# Patient Record
Sex: Female | Born: 1935
Health system: Southern US, Community
[De-identification: ages and names within clinical notes are randomized; demographics above are authoritative.]

## PROBLEM LIST (undated history)

## (undated) DIAGNOSIS — IMO0002 Reserved for concepts with insufficient information to code with codable children: Secondary | ICD-10-CM

## (undated) DIAGNOSIS — A419 Sepsis, unspecified organism: Secondary | ICD-10-CM

## (undated) DIAGNOSIS — K589 Irritable bowel syndrome without diarrhea: Secondary | ICD-10-CM

## (undated) DIAGNOSIS — M1990 Unspecified osteoarthritis, unspecified site: Secondary | ICD-10-CM

## (undated) DIAGNOSIS — K219 Gastro-esophageal reflux disease without esophagitis: Secondary | ICD-10-CM

## (undated) DIAGNOSIS — N189 Chronic kidney disease, unspecified: Secondary | ICD-10-CM

## (undated) DIAGNOSIS — J45909 Unspecified asthma, uncomplicated: Secondary | ICD-10-CM

## (undated) DIAGNOSIS — M4800 Spinal stenosis, site unspecified: Secondary | ICD-10-CM

## (undated) DIAGNOSIS — F419 Anxiety disorder, unspecified: Secondary | ICD-10-CM

## (undated) DIAGNOSIS — D869 Sarcoidosis, unspecified: Secondary | ICD-10-CM

## (undated) DIAGNOSIS — E7800 Pure hypercholesterolemia, unspecified: Secondary | ICD-10-CM

## (undated) DIAGNOSIS — I10 Essential (primary) hypertension: Secondary | ICD-10-CM

## (undated) HISTORY — DX: Unspecified asthma, uncomplicated: J45.909

## (undated) HISTORY — DX: Chronic kidney disease, unspecified: N18.9

## (undated) HISTORY — DX: Sepsis, unspecified organism: A41.9

## (undated) HISTORY — DX: Anxiety disorder, unspecified: F41.9

## (undated) HISTORY — DX: Unspecified osteoarthritis, unspecified site: M19.90

---

## 2006-12-19 ENCOUNTER — Emergency Department (HOSPITAL_COMMUNITY): Admission: EM | Admit: 2006-12-19 | Discharge: 2006-12-19 | Payer: Self-pay | Admitting: Emergency Medicine

## 2010-03-30 ENCOUNTER — Emergency Department (HOSPITAL_BASED_OUTPATIENT_CLINIC_OR_DEPARTMENT_OTHER)
Admission: EM | Admit: 2010-03-30 | Discharge: 2010-03-30 | Disposition: A | Discharge status: Home / Self Care | Payer: Medicare Other | Attending: Emergency Medicine | Admitting: Emergency Medicine

## 2010-03-30 DIAGNOSIS — E119 Type 2 diabetes mellitus without complications: Secondary | ICD-10-CM | POA: Insufficient documentation

## 2010-03-30 DIAGNOSIS — R3989 Other symptoms and signs involving the genitourinary system: Secondary | ICD-10-CM | POA: Insufficient documentation

## 2010-03-30 DIAGNOSIS — R5381 Other malaise: Secondary | ICD-10-CM | POA: Insufficient documentation

## 2010-03-30 DIAGNOSIS — E7800 Pure hypercholesterolemia, unspecified: Secondary | ICD-10-CM | POA: Insufficient documentation

## 2010-03-30 DIAGNOSIS — I10 Essential (primary) hypertension: Secondary | ICD-10-CM | POA: Insufficient documentation

## 2010-03-30 DIAGNOSIS — Z79899 Other long term (current) drug therapy: Secondary | ICD-10-CM | POA: Insufficient documentation

## 2010-03-30 LAB — COMPREHENSIVE METABOLIC PANEL
ALT: 16 U/L (ref 0–35)
Albumin: 4.1 g/dL (ref 3.5–5.2)
Calcium: 9.6 mg/dL (ref 8.4–10.5)
Creatinine, Ser: 1 mg/dL (ref 0.4–1.2)
Glucose, Bld: 270 mg/dL — ABNORMAL HIGH (ref 70–99)
Sodium: 143 mEq/L (ref 135–145)
Total Bilirubin: 0.5 mg/dL (ref 0.3–1.2)
Total Protein: 7 g/dL (ref 6.0–8.3)

## 2010-03-30 LAB — POCT CARDIAC MARKERS
Myoglobin, poc: 95.8 ng/mL (ref 12–200)
Troponin i, poc: <0.05 ng/mL (ref 0.00–0.09)

## 2010-03-30 LAB — DIFFERENTIAL
Lymphs Abs: 1 10*3/uL (ref 0.7–4.0)
Monocytes Absolute: 0.4 10*3/uL (ref 0.1–1.0)
Monocytes Relative: 8 % (ref 3–12)

## 2010-03-30 LAB — CBC
HCT: 35.7 % — ABNORMAL LOW (ref 36.0–46.0)
Hemoglobin: 11.9 g/dL — ABNORMAL LOW (ref 12.0–15.0)
MCHC: 33.3 g/dL (ref 30.0–36.0)
MCV: 91.5 fL (ref 78.0–100.0)
RBC: 3.9 MIL/uL (ref 3.87–5.11)

## 2010-03-30 LAB — URINALYSIS, ROUTINE W REFLEX MICROSCOPIC
Bilirubin Urine: NEGATIVE
Hgb urine dipstick: NEGATIVE
Ketones, ur: NEGATIVE mg/dL
Specific Gravity, Urine: 1.013 (ref 1.005–1.030)
Urine Glucose, Fasting: 250 mg/dL — AB

## 2010-04-21 ENCOUNTER — Emergency Department (HOSPITAL_BASED_OUTPATIENT_CLINIC_OR_DEPARTMENT_OTHER)
Admission: EM | Admit: 2010-04-21 | Discharge: 2010-04-21 | Disposition: A | Discharge status: Nursing Home (Includes ICF) | Payer: Medicare Other | Attending: Emergency Medicine | Admitting: Emergency Medicine

## 2010-04-21 ENCOUNTER — Emergency Department (INDEPENDENT_AMBULATORY_CARE_PROVIDER_SITE_OTHER): Payer: Medicare Other

## 2010-04-21 DIAGNOSIS — I10 Essential (primary) hypertension: Secondary | ICD-10-CM | POA: Insufficient documentation

## 2010-04-21 DIAGNOSIS — R509 Fever, unspecified: Secondary | ICD-10-CM

## 2010-04-21 DIAGNOSIS — J189 Pneumonia, unspecified organism: Secondary | ICD-10-CM | POA: Insufficient documentation

## 2010-04-21 DIAGNOSIS — E7800 Pure hypercholesterolemia, unspecified: Secondary | ICD-10-CM | POA: Insufficient documentation

## 2010-04-21 DIAGNOSIS — E119 Type 2 diabetes mellitus without complications: Secondary | ICD-10-CM | POA: Insufficient documentation

## 2010-04-21 DIAGNOSIS — R0609 Other forms of dyspnea: Secondary | ICD-10-CM | POA: Insufficient documentation

## 2010-04-21 DIAGNOSIS — Z79899 Other long term (current) drug therapy: Secondary | ICD-10-CM | POA: Insufficient documentation

## 2010-04-21 DIAGNOSIS — R0989 Other specified symptoms and signs involving the circulatory and respiratory systems: Secondary | ICD-10-CM | POA: Insufficient documentation

## 2010-04-21 LAB — URINALYSIS, ROUTINE W REFLEX MICROSCOPIC
Hgb urine dipstick: NEGATIVE
Ketones, ur: 15 mg/dL — AB
pH: 6.5 (ref 5.0–8.0)

## 2010-04-21 LAB — CBC
Hemoglobin: 12.3 g/dL (ref 12.0–15.0)
MCHC: 34.4 g/dL (ref 30.0–36.0)
RDW: 13.2 % (ref 11.5–15.5)
WBC: 12.6 10*3/uL — ABNORMAL HIGH (ref 4.0–10.5)

## 2010-04-21 LAB — DIFFERENTIAL
Basophils Relative: 0 % (ref 0–1)
Eosinophils Absolute: 0 10*3/uL (ref 0.0–0.7)
Eosinophils Relative: 0 % (ref 0–5)
Lymphocytes Relative: 5 % — ABNORMAL LOW (ref 12–46)
Monocytes Absolute: 1.4 10*3/uL — ABNORMAL HIGH (ref 0.1–1.0)
Neutrophils Relative %: 83 % — ABNORMAL HIGH (ref 43–77)

## 2010-04-21 LAB — BASIC METABOLIC PANEL
BUN: 16 mg/dL (ref 6–23)
Calcium: 9.4 mg/dL (ref 8.4–10.5)
Chloride: 100 mEq/L (ref 96–112)
Creatinine, Ser: 0.9 mg/dL (ref 0.4–1.2)
GFR calc Af Amer: >60 mL/min (ref >60)
GFR calc non Af Amer: 60 mL/min — ABNORMAL LOW (ref >60)
Glucose, Bld: 201 mg/dL — ABNORMAL HIGH (ref 70–99)
Potassium: 3.9 mEq/L (ref 3.5–5.1)

## 2010-11-18 LAB — COMPREHENSIVE METABOLIC PANEL
Alkaline Phosphatase: 77
BUN: 11
CO2: 26
Calcium: 9.1
Chloride: 102
Creatinine, Ser: 0.89
GFR calc Af Amer: >60
Glucose, Bld: 168 — ABNORMAL HIGH
Potassium: 3.6
Total Bilirubin: 0.8
Total Protein: 6.9

## 2010-11-18 LAB — CBC
Hemoglobin: 12.2
MCHC: 33.3
Platelets: 207

## 2010-11-18 LAB — URINALYSIS, ROUTINE W REFLEX MICROSCOPIC
Hgb urine dipstick: NEGATIVE
Ketones, ur: 15 — AB
Specific Gravity, Urine: 1.017
pH: 6

## 2010-11-18 LAB — DIFFERENTIAL
Basophils Absolute: 0
Basophils Relative: 0
Neutro Abs: 6.4
Neutrophils Relative %: 81 — ABNORMAL HIGH

## 2010-11-18 LAB — I-STAT 8, (EC8 V) (CONVERTED LAB)
Acid-Base Excess: 2
BUN: 12
Bicarbonate: 27.2 — ABNORMAL HIGH
Chloride: 104
Glucose, Bld: 175 — ABNORMAL HIGH
Hemoglobin: 13.9
Potassium: 3.6
Sodium: 138
pCO2, Ven: 44.8 — ABNORMAL LOW

## 2010-11-18 LAB — URINE CULTURE: Colony Count: 50000

## 2010-11-18 LAB — URINE MICROSCOPIC-ADD ON

## 2010-11-18 LAB — OCCULT BLOOD X 1 CARD TO LAB, STOOL: Fecal Occult Bld: NEGATIVE

## 2010-11-18 LAB — POCT I-STAT CREATININE: Operator id: 257131

## 2011-03-28 ENCOUNTER — Encounter (HOSPITAL_BASED_OUTPATIENT_CLINIC_OR_DEPARTMENT_OTHER): Payer: Self-pay | Admitting: *Deleted

## 2011-03-28 ENCOUNTER — Emergency Department (HOSPITAL_BASED_OUTPATIENT_CLINIC_OR_DEPARTMENT_OTHER)
Admission: EM | Admit: 2011-03-28 | Discharge: 2011-03-28 | Disposition: A | Discharge status: Home / Self Care | Payer: Medicare Other | Attending: Emergency Medicine | Admitting: Emergency Medicine

## 2011-03-28 DIAGNOSIS — E7800 Pure hypercholesterolemia, unspecified: Secondary | ICD-10-CM | POA: Insufficient documentation

## 2011-03-28 DIAGNOSIS — K5641 Fecal impaction: Secondary | ICD-10-CM

## 2011-03-28 DIAGNOSIS — I10 Essential (primary) hypertension: Secondary | ICD-10-CM | POA: Insufficient documentation

## 2011-03-28 DIAGNOSIS — K5900 Constipation, unspecified: Secondary | ICD-10-CM | POA: Insufficient documentation

## 2011-03-28 DIAGNOSIS — E119 Type 2 diabetes mellitus without complications: Secondary | ICD-10-CM | POA: Insufficient documentation

## 2011-03-28 HISTORY — DX: Essential (primary) hypertension: I10

## 2011-03-28 HISTORY — DX: Sarcoidosis, unspecified: D86.9

## 2011-03-28 HISTORY — DX: Pure hypercholesterolemia, unspecified: E78.00

## 2011-03-28 MED ORDER — FLEET ENEMA 7-19 GM/118ML RE ENEM
2.0000 | ENEMA | Freq: Once | RECTAL | Status: AC
  Administered 2011-03-28: 2 via RECTAL

## 2011-03-28 MED ORDER — FLEET ENEMA 7-19 GM/118ML RE ENEM
ENEMA | RECTAL | Status: AC
  Administered 2011-03-28: 2 via RECTAL
  Filled 2011-03-28: qty 2

## 2011-03-28 NOTE — ED Notes (Signed)
Pt has had dark tarry BM x1 on bedpan.  Pt was cleaned and changed with help of Lauren, EMT.

## 2011-03-28 NOTE — ED Notes (Signed)
Pt states her last BM was 2 days ago and she feels the need to go now, but cannot. Took part of an enema at 6p, but no results.

## 2011-03-28 NOTE — ED Notes (Signed)
Pt is on bed pan, karen, pa disimpacted pt digitally after administering two enemas.

## 2011-03-28 NOTE — Discharge Instructions (Signed)

## 2011-03-28 NOTE — ED Provider Notes (Signed)
History     CSN: [id number]  Arrival date & time 03/28/11  2005   First MD Initiated Contact with Patient 03/28/11 2222      Chief Complaint  Patient presents with  . Constipation    (Consider location/radiation/quality/duration/timing/severity/associated sxs/prior treatment) Patient is a 78 y.o. female presenting with constipation. The history is provided by the patient.  Constipation  The current episode started 2 days ago. The problem has been gradually worsening. The pain is moderate. The stool is described as hard. Prior successful therapies include fiber, laxatives and enemas. There was no prior unsuccessful therapy. She has been eating and drinking normally. There were no sick contacts.  Pt complains of constipation.  Pt took MOM at home.  Pt tried to give herself an enema but could not.  Past Medical History  Diagnosis Date  . Diabetes mellitus   . Hypertension   . Sarcoidosis   . Hypercholesteremia     History reviewed. No pertinent past surgical history.  History reviewed. No pertinent family history.  History  Substance Use Topics  . Smoking status: Former Games developer  . Smokeless tobacco: Not on file  . Alcohol Use: No    OB History    Grav Para Term Preterm Abortions TAB SAB Ect Mult Living                  Review of Systems  Gastrointestinal: Positive for constipation.  All other systems reviewed and are negative.    Allergies  Review of patient's allergies indicates no known allergies.  Home Medications   Current Outpatient Rx  Name Route Sig Dispense Refill  . BISACODYL 10 MG/30ML RE ENEM Rectal Place 10 mg rectally once.    . CLONAZEPAM 0.5 MG PO TABS Oral Take 0.5 mg by mouth 2 (two) times daily as needed. For anxiety    . GLIPIZIDE 10 MG PO TABS Oral Take 10 mg by mouth 2 (two) times daily before a meal.    . METFORMIN HCL 500 MG PO TABS Oral Take 500 mg by mouth 2 (two) times daily with a meal.    . NIFEDIPINE ER 30 MG PO TB24 Oral Take 30  mg by mouth daily.    Marland Kitchen OMEPRAZOLE 20 MG PO CPDR Oral Take 20 mg by mouth daily.    Marland Kitchen SIMVASTATIN 40 MG PO TABS Oral Take 40 mg by mouth every evening.      BP 153/69  Pulse 83  Temp(Src) 98.2 F (36.8 C) (Oral)  Resp 15  Ht 5\' 5"  (1.651 m)  Wt 134 lb (60.782 kg)  BMI 22.30 kg/m2  SpO2 96%  Physical Exam  Vitals reviewed. Constitutional: She appears well-developed.  HENT:  Head: Normocephalic.  Eyes: Conjunctivae are normal. Pupils are equal, round, and reactive to light.  Neck: Normal range of motion.  Cardiovascular: Normal rate and normal heart sounds.   Abdominal: Soft. Bowel sounds are normal.  Genitourinary:       Rectal exam,  Impacted,  Rectum dilated with stool protruding  Musculoskeletal: Normal range of motion.  Neurological: She is alert.  Skin: Skin is warm.  Psychiatric: She has a normal mood and affect.    ED Course  Procedures (including critical care time)  Labs Reviewed - No data to display No results found.   No diagnosis found.    MDM    I removed distal stool.  I tried to break up impaction and had pt sit on toliet,  Pt passed very small amount.  Pt given enema by me,  Enema loosened enough that I could remove distal stool impaction.  Pt given 2nd enema after impaction and pt was able to have large bm.   Pt reports she feels much better.       Langston Masker, [place] 03/28/11 2341

## 2011-03-29 NOTE — ED Provider Notes (Signed)
Medical screening examination/treatment/procedure(s) were conducted as a shared visit with non-physician practitioner(s) and myself.  I personally evaluated the patient during the encounter Patient complains of constipation feels much improved after treatment in the emergency department with manual disimpaction and enema. Patient alert not acutely ill appearing.  Doug Sou, MD 03/29/11 [phone]

## 2011-10-04 ENCOUNTER — Encounter (HOSPITAL_BASED_OUTPATIENT_CLINIC_OR_DEPARTMENT_OTHER): Payer: Self-pay | Admitting: *Deleted

## 2011-10-04 ENCOUNTER — Emergency Department (HOSPITAL_BASED_OUTPATIENT_CLINIC_OR_DEPARTMENT_OTHER)
Admission: EM | Admit: 2011-10-04 | Discharge: 2011-10-04 | Discharge status: Against Medical Advice | Payer: Medicare Other | Attending: Emergency Medicine | Admitting: Emergency Medicine

## 2011-10-04 DIAGNOSIS — K5900 Constipation, unspecified: Secondary | ICD-10-CM | POA: Insufficient documentation

## 2011-10-04 NOTE — ED Notes (Signed)
Ptstates she has not had a BM since Tues. Tried suppositories and enemas without success. C/O abd and rectal discomfort.

## 2012-07-14 ENCOUNTER — Encounter (HOSPITAL_BASED_OUTPATIENT_CLINIC_OR_DEPARTMENT_OTHER): Payer: Self-pay | Admitting: *Deleted

## 2012-07-14 ENCOUNTER — Emergency Department (HOSPITAL_BASED_OUTPATIENT_CLINIC_OR_DEPARTMENT_OTHER)
Admission: EM | Admit: 2012-07-14 | Discharge: 2012-07-14 | Disposition: A | Discharge status: Home / Self Care | Payer: Medicare Other | Attending: Emergency Medicine | Admitting: Emergency Medicine

## 2012-07-14 DIAGNOSIS — Z79899 Other long term (current) drug therapy: Secondary | ICD-10-CM | POA: Insufficient documentation

## 2012-07-14 DIAGNOSIS — E119 Type 2 diabetes mellitus without complications: Secondary | ICD-10-CM | POA: Insufficient documentation

## 2012-07-14 DIAGNOSIS — Z8619 Personal history of other infectious and parasitic diseases: Secondary | ICD-10-CM | POA: Insufficient documentation

## 2012-07-14 DIAGNOSIS — K5641 Fecal impaction: Secondary | ICD-10-CM

## 2012-07-14 DIAGNOSIS — I10 Essential (primary) hypertension: Secondary | ICD-10-CM | POA: Insufficient documentation

## 2012-07-14 DIAGNOSIS — E7800 Pure hypercholesterolemia, unspecified: Secondary | ICD-10-CM | POA: Insufficient documentation

## 2012-07-14 DIAGNOSIS — Z87891 Personal history of nicotine dependence: Secondary | ICD-10-CM | POA: Insufficient documentation

## 2012-07-14 NOTE — ED Provider Notes (Signed)
History     CSN: [id number]  Arrival date & time 07/14/12  1102   First MD Initiated Contact with Patient 07/14/12 1139      Chief Complaint  Patient presents with  . Abdominal Pain  . Constipation    (Consider location/radiation/quality/duration/timing/severity/associated sxs/prior treatment) HPI Complains of unable to have bowel movement for the past 2 days. Feels a fullness in her rectum. She denies abdominal pain denies anorexia denies fever denies other complaint. She treated herself with a laxative yesterday with no relief. Nothing makes symptoms better or worse she's had similar episodes in the past. She feels constipated. Past Medical History  Diagnosis Date  . Diabetes mellitus   . Hypertension   . Sarcoidosis   . Hypercholesteremia     History reviewed. No pertinent past surgical history.  No family history on file.  History  Substance Use Topics  . Smoking status: Former Games developer  . Smokeless tobacco: Not on file  . Alcohol Use: No    OB History   Grav Para Term Preterm Abortions TAB SAB Ect Mult Living                  Review of Systems  Constitutional: Negative.   HENT: Negative.   Respiratory: Negative.   Cardiovascular: Negative.   Gastrointestinal: Positive for constipation.  Musculoskeletal: Negative.   Skin: Negative.   Allergic/Immunologic: Positive for immunocompromised state.       Diabetic  Neurological: Negative.   Psychiatric/Behavioral: Negative.   All other systems reviewed and are negative.    Allergies  Review of patient's allergies indicates no known allergies.  Home Medications   Current Outpatient Rx  Name  Route  Sig  Dispense  Refill  . TRAMADOL HCL PO   Oral   Take by mouth.         . bisacodyl (FLEET) 10 MG/30ML ENEM   Rectal   Place 10 mg rectally once.         . clonazePAM (KLONOPIN) 0.5 MG tablet   Oral   Take 0.5 mg by mouth 2 (two) times daily as needed. For anxiety         . diclofenac sodium  (VOLTAREN) 1 % GEL   Topical   Apply 2 g topically as needed.         Marland Kitchen glipiZIDE (GLUCOTROL) 10 MG tablet   Oral   Take 10 mg by mouth 2 (two) times daily before a meal.         . metFORMIN (GLUCOPHAGE) 500 MG tablet   Oral   Take 500 mg by mouth 2 (two) times daily with a meal.         . NIFEdipine (PROCARDIA-XL/ADALAT CC) 30 MG 24 hr tablet   Oral   Take 30 mg by mouth daily.         Marland Kitchen omeprazole (PRILOSEC) 20 MG capsule   Oral   Take 20 mg by mouth daily.         . simvastatin (ZOCOR) 40 MG tablet   Oral   Take 40 mg by mouth every evening.           BP 105/56  Pulse 104  Temp(Src) 99.4 F (37.4 C) (Oral)  Resp 18  SpO2 97%  Physical Exam  Nursing note and vitals reviewed. Constitutional: She appears well-developed and well-nourished.  HENT:  Head: Normocephalic and atraumatic.  Eyes: Conjunctivae are normal. Pupils are equal, round, and reactive to light.  Neck: Neck supple. No  tracheal deviation present. No thyromegaly present.  Cardiovascular: Normal rate and regular rhythm.   No murmur heard. Pulmonary/Chest: Effort normal and breath sounds normal.  Abdominal: Soft. Bowel sounds are normal. She exhibits no distension. There is no tenderness.  Genitourinary:  Copious hard brown stool in the rectum, nontender no external lesion.  Musculoskeletal: Normal range of motion. She exhibits no edema and no tenderness.  Neurological: She is alert. Coordination normal.  Skin: Skin is warm and dry. No rash noted.  Psychiatric: She has a normal mood and affect.    ED Course  Procedures (including critical care time)  Labs Reviewed - No data to display No results found.   No diagnosis found.  Patient was digitally disimpacted by me Patient had bowel movement after digital disimpaction. At 12:30 PM she is asymptomatic. MDM  Discharge instructions. I stressed patient that she needs to stable hydrated. Fruits and vegetables. Diagnosis fecal  impaction        Doug Sou, MD 07/14/12 1232

## 2012-07-14 NOTE — ED Notes (Signed)
Abdominal pain. States she has been constipated x 2 days. She took a laxative yesterday.

## 2013-02-22 ENCOUNTER — Emergency Department (HOSPITAL_BASED_OUTPATIENT_CLINIC_OR_DEPARTMENT_OTHER): Payer: Medicare Other

## 2013-02-22 ENCOUNTER — Encounter (HOSPITAL_BASED_OUTPATIENT_CLINIC_OR_DEPARTMENT_OTHER): Payer: Self-pay | Admitting: Emergency Medicine

## 2013-02-22 ENCOUNTER — Emergency Department (HOSPITAL_BASED_OUTPATIENT_CLINIC_OR_DEPARTMENT_OTHER)
Admission: EM | Admit: 2013-02-22 | Discharge: 2013-02-22 | Disposition: A | Discharge status: Home / Self Care | Payer: Medicare Other | Attending: Emergency Medicine | Admitting: Emergency Medicine

## 2013-02-22 DIAGNOSIS — M549 Dorsalgia, unspecified: Secondary | ICD-10-CM

## 2013-02-22 DIAGNOSIS — Z79899 Other long term (current) drug therapy: Secondary | ICD-10-CM | POA: Insufficient documentation

## 2013-02-22 DIAGNOSIS — N390 Urinary tract infection, site not specified: Secondary | ICD-10-CM | POA: Insufficient documentation

## 2013-02-22 DIAGNOSIS — I10 Essential (primary) hypertension: Secondary | ICD-10-CM | POA: Insufficient documentation

## 2013-02-22 DIAGNOSIS — Z87891 Personal history of nicotine dependence: Secondary | ICD-10-CM | POA: Insufficient documentation

## 2013-02-22 DIAGNOSIS — Z8619 Personal history of other infectious and parasitic diseases: Secondary | ICD-10-CM | POA: Insufficient documentation

## 2013-02-22 DIAGNOSIS — M545 Low back pain, unspecified: Secondary | ICD-10-CM | POA: Insufficient documentation

## 2013-02-22 DIAGNOSIS — E7800 Pure hypercholesterolemia, unspecified: Secondary | ICD-10-CM | POA: Insufficient documentation

## 2013-02-22 DIAGNOSIS — E119 Type 2 diabetes mellitus without complications: Secondary | ICD-10-CM | POA: Insufficient documentation

## 2013-02-22 LAB — URINALYSIS, ROUTINE W REFLEX MICROSCOPIC
Bilirubin Urine: NEGATIVE
GLUCOSE, UA: NEGATIVE mg/dL
HGB URINE DIPSTICK: NEGATIVE
Ketones, ur: NEGATIVE mg/dL
Nitrite: NEGATIVE
PROTEIN: NEGATIVE mg/dL
SPECIFIC GRAVITY, URINE: 1.014 (ref 1.005–1.030)
Urobilinogen, UA: 1 mg/dL (ref 0.0–1.0)
pH: 7.5 (ref 5.0–8.0)

## 2013-02-22 LAB — URINE MICROSCOPIC-ADD ON: RBC / HPF: NONE SEEN RBC/hpf (ref <3)

## 2013-02-22 MED ORDER — CEPHALEXIN 500 MG PO CAPS: 500.0000 mg | ORAL_CAPSULE | Freq: Four times a day (QID) | ORAL | Status: DC

## 2013-02-22 NOTE — Discharge Instructions (Signed)
Urinary Tract Infection  Urinary tract infections (UTIs) can develop anywhere along your urinary tract. Your urinary tract is your body's drainage system for removing wastes and extra water. Your urinary tract includes two kidneys, two ureters, a bladder, and a urethra. Your kidneys are a pair of bean-shaped organs. Each kidney is about the size of your fist. They are located below your ribs, one on each side of your spine.  CAUSES  Infections are caused by microbes, which are microscopic organisms, including fungi, viruses, and bacteria. These organisms are so small that they can only be seen through a microscope. Bacteria are the microbes that most commonly cause UTIs.  SYMPTOMS   Symptoms of UTIs may vary by age and gender of the patient and by the location of the infection. Symptoms in young women typically include a frequent and intense urge to urinate and a painful, burning feeling in the bladder or urethra during urination. Older women and men are more likely to be tired, shaky, and weak and have muscle aches and abdominal pain. A fever may mean the infection is in your kidneys. Other symptoms of a kidney infection include pain in your back or sides below the ribs, nausea, and vomiting.  DIAGNOSIS  To diagnose a UTI, your caregiver will ask you about your symptoms. Your caregiver also will ask to provide a urine sample. The urine sample will be tested for bacteria and white blood cells. White blood cells are made by your body to help fight infection.  TREATMENT   Typically, UTIs can be treated with medication. Because most UTIs are caused by a bacterial infection, they usually can be treated with the use of antibiotics. The choice of antibiotic and length of treatment depend on your symptoms and the type of bacteria causing your infection.  HOME CARE INSTRUCTIONS   If you were prescribed antibiotics, take them exactly as your caregiver instructs you. Finish the medication even if you feel better after you  have only taken some of the medication.   Drink enough water and fluids to keep your urine clear or pale yellow.   Avoid caffeine, tea, and carbonated beverages. They tend to irritate your bladder.   Empty your bladder often. Avoid holding urine for long periods of time.   Empty your bladder before and after sexual intercourse.   After a bowel movement, women should cleanse from front to back. Use each tissue only once.  SEEK MEDICAL CARE IF:    You have back pain.   You develop a fever.   Your symptoms do not begin to resolve within 3 days.  SEEK IMMEDIATE MEDICAL CARE IF:    You have severe back pain or lower abdominal pain.   You develop chills.   You have nausea or vomiting.   You have continued burning or discomfort with urination.  MAKE SURE YOU:    Understand these instructions.   Will watch your condition.   Will get help right away if you are not doing well or get worse.  Document Released: 11/05/2004 Document Revised: 07/28/2011 Document Reviewed: 03/06/2011  [employer] Patient Information 2014 [employer].

## 2013-02-22 NOTE — ED Notes (Signed)
Patient states that she had a hard time sleeping because her back was hurting and she couldn't get comfortable. Daughter states that patient get constipated, but patient had a BM this AM.

## 2013-02-22 NOTE — ED Provider Notes (Signed)
CSN: [id number]     Arrival date & time 02/22/13  0751 History   First MD Initiated Contact with Patient 02/22/13 0802     Chief Complaint  Patient presents with  . Back Pain   (Consider location/radiation/quality/duration/timing/severity/associated sxs/prior Treatment) Patient is a 80 y.o. female presenting with back pain. The history is provided by the patient and a relative.  Back Pain Location:  Thoracic spine and lumbar spine Quality:  Aching Radiates to:  Does not radiate Pain severity:  Moderate Pain is:  Worse during the night Onset quality:  Gradual Timing:  Intermittent Progression:  Worsening Chronicity:  Recurrent Context: not emotional stress, not recent illness, not recent injury and not twisting   Relieved by: pain pill. Worsened by:  Nothing tried Ineffective treatments:  None tried Associated symptoms: no abdominal pain, no chest pain, no dysuria, no fever, no weakness and no weight loss     Past Medical History  Diagnosis Date  . Diabetes mellitus   . Hypertension   . Sarcoidosis   . Hypercholesteremia    Past Surgical History  Procedure Laterality Date  . Eye surgery      cataract   No family history on file. History  Substance Use Topics  . Smoking status: Former Games developermoker  . Smokeless tobacco: Not on file  . Alcohol Use: No   OB History   Grav Para Term Preterm Abortions TAB SAB Ect Mult Living                 Review of Systems  Constitutional: Negative for fever and weight loss.  Cardiovascular: Negative for chest pain.  Gastrointestinal: Negative for abdominal pain.  Genitourinary: Negative for dysuria.  Musculoskeletal: Positive for back pain.  Neurological: Negative for weakness.  All other systems reviewed and are negative.    Allergies  Review of patient's allergies indicates no known allergies.  Home Medications   Current Outpatient Rx  Name  Route  Sig  Dispense  Refill  . dicyclomine (BENTYL) 20 MG tablet   Oral   Take  20 mg by mouth every 6 (six) hours.         . megestrol (MEGACE) 20 MG tablet   Oral   Take 20 mg by mouth daily.         . mirtazapine (REMERON) 30 MG tablet   Oral   Take 30 mg by mouth at bedtime.         . bisacodyl (FLEET) 10 MG/30ML ENEM   Rectal   Place 10 mg rectally once.         . clonazePAM (KLONOPIN) 0.5 MG tablet   Oral   Take 0.5 mg by mouth 2 (two) times daily as needed. For anxiety         . diclofenac sodium (VOLTAREN) 1 % GEL   Topical   Apply 2 g topically as needed.         Marland Kitchen. glipiZIDE (GLUCOTROL) 10 MG tablet   Oral   Take 10 mg by mouth 2 (two) times daily before a meal.         . metFORMIN (GLUCOPHAGE) 500 MG tablet   Oral   Take 500 mg by mouth 2 (two) times daily with a meal.         . NIFEdipine (PROCARDIA-XL/ADALAT CC) 30 MG 24 hr tablet   Oral   Take 30 mg by mouth daily.         Marland Kitchen. omeprazole (PRILOSEC) 20 MG capsule  Oral   Take 20 mg by mouth daily.         . simvastatin (ZOCOR) 40 MG tablet   Oral   Take 40 mg by mouth every evening.         Marland Kitchen TRAMADOL HCL PO   Oral   Take by mouth.          BP 129/64  Pulse 92  Temp(Src) 97.6 F (36.4 C) (Oral)  Resp 20  Wt 118 lb (53.524 kg)  SpO2 100% Physical Exam  Nursing note and vitals reviewed. Constitutional: She is oriented to person, place, and time. She appears well-developed and well-nourished. No distress.  HENT:  Head: Normocephalic and atraumatic.  Eyes: EOM are normal. Pupils are equal, round, and reactive to light.  Neck: Normal range of motion. Neck supple.  Cardiovascular: Normal rate and regular rhythm.  Exam reveals no friction rub.   No murmur heard. Pulmonary/Chest: Effort normal and breath sounds normal. No respiratory distress. She has no wheezes. She has no rales.  Abdominal: Soft. She exhibits no distension. There is no tenderness. There is no rebound.  Musculoskeletal: She exhibits no edema.       Lumbar back: She exhibits tenderness  (R flank). She exhibits normal range of motion and no bony tenderness.  Neurological: She is alert and oriented to person, place, and time.  Skin: She is not diaphoretic.    ED Course  Procedures (including critical care time) Labs Review Labs Reviewed  URINALYSIS, ROUTINE W REFLEX MICROSCOPIC   Imaging Review No results found.  EKG Interpretation   None       MDM   1. Urinary tract infection   2. Back pain    80 year old female presents with back pain. She said historian. She states she cannot sleep because her back and hurting worse. She has a history of having this back pain in her lower back but last night the pain was at her right flank. She denies any history of kidney stones, dysuria, hematuria, belly pain, fever, nausea, vomiting. She lives at home with her son. Patient reports that the pain pills able to sleep. Here she is afebrile her vitals are stable. She has no belly pain. She has mild right flank pain on palpation. Her she is no bony tenderness in her back. She has no imaging in her previous records of her abdomen or back. She has had no falls or trauma. We will scan her without contrast look for possible kidney stone also to measure the caliber of aorta to determine if there is a possible aneurysm. We can also evaluate the bony structures of her back this way. Urine with mild UTI. Given keflex. Stable for discharge. CT without aneurysm, bony fracture. No evidence of pyelo on CT.     Dagmar Hait, MD 02/22/13 1050

## 2013-02-23 LAB — URINE CULTURE
Colony Count: NO GROWTH
Culture: NO GROWTH

## 2013-03-29 ENCOUNTER — Emergency Department (HOSPITAL_BASED_OUTPATIENT_CLINIC_OR_DEPARTMENT_OTHER): Payer: Medicare Other

## 2013-03-29 ENCOUNTER — Encounter (HOSPITAL_BASED_OUTPATIENT_CLINIC_OR_DEPARTMENT_OTHER): Payer: Self-pay | Admitting: Emergency Medicine

## 2013-03-29 ENCOUNTER — Emergency Department (HOSPITAL_BASED_OUTPATIENT_CLINIC_OR_DEPARTMENT_OTHER)
Admission: EM | Admit: 2013-03-29 | Discharge: 2013-03-29 | Disposition: A | Discharge status: Home / Self Care | Payer: Medicare Other | Attending: Emergency Medicine | Admitting: Emergency Medicine

## 2013-03-29 DIAGNOSIS — Z8619 Personal history of other infectious and parasitic diseases: Secondary | ICD-10-CM | POA: Insufficient documentation

## 2013-03-29 DIAGNOSIS — Z87891 Personal history of nicotine dependence: Secondary | ICD-10-CM | POA: Insufficient documentation

## 2013-03-29 DIAGNOSIS — Z792 Long term (current) use of antibiotics: Secondary | ICD-10-CM | POA: Insufficient documentation

## 2013-03-29 DIAGNOSIS — E119 Type 2 diabetes mellitus without complications: Secondary | ICD-10-CM | POA: Insufficient documentation

## 2013-03-29 DIAGNOSIS — M549 Dorsalgia, unspecified: Secondary | ICD-10-CM | POA: Insufficient documentation

## 2013-03-29 DIAGNOSIS — Z79899 Other long term (current) drug therapy: Secondary | ICD-10-CM | POA: Insufficient documentation

## 2013-03-29 DIAGNOSIS — R339 Retention of urine, unspecified: Secondary | ICD-10-CM

## 2013-03-29 DIAGNOSIS — I10 Essential (primary) hypertension: Secondary | ICD-10-CM | POA: Insufficient documentation

## 2013-03-29 DIAGNOSIS — K5641 Fecal impaction: Secondary | ICD-10-CM | POA: Insufficient documentation

## 2013-03-29 DIAGNOSIS — E7800 Pure hypercholesterolemia, unspecified: Secondary | ICD-10-CM | POA: Insufficient documentation

## 2013-03-29 LAB — LIPASE, BLOOD: LIPASE: 20 U/L (ref 11–59)

## 2013-03-29 LAB — URINALYSIS, ROUTINE W REFLEX MICROSCOPIC
BILIRUBIN URINE: NEGATIVE
Glucose, UA: NEGATIVE mg/dL
Hgb urine dipstick: NEGATIVE
Ketones, ur: NEGATIVE mg/dL
Leukocytes, UA: NEGATIVE
NITRITE: NEGATIVE
PROTEIN: NEGATIVE mg/dL
SPECIFIC GRAVITY, URINE: 1.015 (ref 1.005–1.030)
UROBILINOGEN UA: 0.2 mg/dL (ref 0.0–1.0)
pH: 7.5 (ref 5.0–8.0)

## 2013-03-29 LAB — COMPREHENSIVE METABOLIC PANEL
ALK PHOS: 43 U/L (ref 39–117)
ALT: 6 U/L (ref 0–35)
AST: 17 U/L (ref 0–37)
Albumin: 4 g/dL (ref 3.5–5.2)
BUN: 21 mg/dL (ref 6–23)
CALCIUM: 9.9 mg/dL (ref 8.4–10.5)
CO2: 25 mEq/L (ref 19–32)
Chloride: 101 mEq/L (ref 96–112)
Creatinine, Ser: 0.9 mg/dL (ref 0.50–1.10)
GFR calc non Af Amer: 56 mL/min — ABNORMAL LOW (ref >90)
GFR, EST AFRICAN AMERICAN: 65 mL/min — AB (ref >90)
GLUCOSE: 153 mg/dL — AB (ref 70–99)
POTASSIUM: 4.6 meq/L (ref 3.7–5.3)
Sodium: 138 mEq/L (ref 137–147)
TOTAL PROTEIN: 7.2 g/dL (ref 6.0–8.3)
Total Bilirubin: 0.5 mg/dL (ref 0.3–1.2)

## 2013-03-29 LAB — CBC WITH DIFFERENTIAL/PLATELET
Basophils Absolute: 0 10*3/uL (ref 0.0–0.1)
Basophils Relative: 0 % (ref 0–1)
EOS ABS: 0.1 10*3/uL (ref 0.0–0.7)
EOS PCT: 1 % (ref 0–5)
HEMATOCRIT: 30.6 % — AB (ref 36.0–46.0)
HEMOGLOBIN: 10.5 g/dL — AB (ref 12.0–15.0)
LYMPHS ABS: 0.9 10*3/uL (ref 0.7–4.0)
Lymphocytes Relative: 12 % (ref 12–46)
MCH: 31.8 pg (ref 26.0–34.0)
MCHC: 34.3 g/dL (ref 30.0–36.0)
MCV: 92.7 fL (ref 78.0–100.0)
MONOS PCT: 10 % (ref 3–12)
Monocytes Absolute: 0.7 10*3/uL (ref 0.1–1.0)
Neutro Abs: 5.6 10*3/uL (ref 1.7–7.7)
Neutrophils Relative %: 77 % (ref 43–77)
Platelets: 171 10*3/uL (ref 150–400)
RBC: 3.3 MIL/uL — AB (ref 3.87–5.11)
RDW: 12.9 % (ref 11.5–15.5)
WBC: 7.2 10*3/uL (ref 4.0–10.5)

## 2013-03-29 LAB — CG4 I-STAT (LACTIC ACID): LACTIC ACID, VENOUS: 0.98 mmol/L (ref 0.5–2.2)

## 2013-03-29 LAB — OCCULT BLOOD X 1 CARD TO LAB, STOOL: Fecal Occult Bld: NEGATIVE

## 2013-03-29 MED ORDER — MORPHINE SULFATE 4 MG/ML IJ SOLN
4.0000 mg | Freq: Once | INTRAMUSCULAR | Status: AC
  Administered 2013-03-29: 4 mg via INTRAVENOUS
  Filled 2013-03-29: qty 1

## 2013-03-29 MED ORDER — POLYETHYLENE GLYCOL 3350 17 G PO PACK: 17.0000 g | PACK | Freq: Every day | ORAL | Status: DC

## 2013-03-29 MED ORDER — FLEET ENEMA 7-19 GM/118ML RE ENEM
1.0000 | ENEMA | Freq: Once | RECTAL | Status: AC
  Administered 2013-03-29: 1 via RECTAL
  Filled 2013-03-29: qty 1

## 2013-03-29 MED ORDER — FLEET ENEMA 7-19 GM/118ML RE ENEM
ENEMA | RECTAL | Status: AC
  Administered 2013-03-29: 1 via RECTAL
  Filled 2013-03-29: qty 1

## 2013-03-29 MED ORDER — ONDANSETRON HCL 4 MG/2ML IJ SOLN
4.0000 mg | Freq: Once | INTRAMUSCULAR | Status: AC
  Administered 2013-03-29: 4 mg via INTRAVENOUS
  Filled 2013-03-29: qty 2

## 2013-03-29 NOTE — ED Notes (Signed)
Instructions given to patient and family by RN and MD for care and f/u for foley cath

## 2013-03-29 NOTE — Discharge Instructions (Signed)
Fecal Impaction Keep the catheter in place and followup with Dr. next week. Followup with MRI as scheduled by Dr. Willa RoughHicks. Use MiraLAX daily. Return to the ED if you develop new or worsening symptoms. A fecal impaction happens when there is a large, firm amount of stool (or feces) that cannot be passed. The impacted stool is usually in the rectum, which is the lowest part of the large bowel. The impacted stool can block the colon and cause significant problems. CAUSES  The longer stool stays in the rectum, the harder it gets. Anything that slows down your bowel movements can lead to fecal impaction, such as:  Constipation. This can be a long-standing (chronic) problem or can happen suddenly (acute).  Painful conditions of the rectum, such as hemorrhoids or anal fissures. The pain of these conditions can make you try to avoid having bowel movements.  Narcotic pain-relieving medicines, such as methadone, morphine, or codeine.  Not drinking enough fluids.  Inactivity and bed rest over long periods of time.  Diseases of the brain or nervous system that damage the nerves controlling the muscles of the intestines. SIGNS AND SYMPTOMS   Lack of normal bowel movements or changes in bowel patterns.  Sense of fullness in the rectum but unable to pass stool.  Pain or cramps in the abdominal area (often after meals).  Thin, watery discharge from the rectum. DIAGNOSIS  Your health care provider may suspect that you have a fecal impaction based on your symptoms and a physical exam. This will include an exam of your rectum. Sometimes X-rays or lab testing may be needed to confirm the diagnosis and to be sure there are no other problems.  TREATMENT   Initially an impaction can be removed manually. Using a gloved finger, your health care provider can remove hard stool from your rectum.  Medicine is sometimes needed. A suppository or enema can be given in the rectum to soften the stool, which can  stimulate a bowel movement. Medicines can also be given by mouth (orally).  Though rare, surgery may be needed if the colon has torn (perforated) due to blockage. HOME CARE INSTRUCTIONS   Develop regular bowel habits. This could include getting in the habit of having a bowel movement after your morning cup of coffee or after eating. Be sure to allow yourself enough time on the toilet.  Maintain a high-fiber diet.  Drink enough fluids to keep your urine clear or pale yellow as directed by your health care provider.  Exercise regularly.  If you begin to get constipated, increase the amount of fiber in your diet. Eat plenty of fruits, vegetables, whole wheat breads, bran, oatmeal, and similar products.  Take natural fiber laxatives or other laxatives only as directed by your health care provider. SEEK MEDICAL CARE IF:   You have ongoing rectal pain.  You require enemas or suppositories more than twice a week. Acute Urinary Retention Acute urinary retention is the temporary inability to urinate. This is an uncommon problem in women. It can be caused by: Infection. A side effect of a medicine. A problem in a nearby organ that presses or squeezes on the bladder or the urethra (the tube that drains the bladder). Psychological problems.  Surgery on your bladder, urethra, or pelvic organs that causes obstruction to the outflow of urine from your bladder. HOME CARE INSTRUCTIONS  If you are sent home with a Foley catheter and a drainage system, you will need to discuss the best course of action with  your health care provider. While the catheter is in, maintain a good intake of fluids. Keep the drainage bag emptied and lower than your catheter. This is so that contaminated urine will not flow back into your bladder, which could lead to a urinary tract infection. There are two main types of drainage bags. One is a large bag that usually is used at night. It has a good capacity that will allow you to  sleep through the night without having to empty it. The second type is called a leg bag. It has a smaller capacity so it needs to be emptied more frequently. However, the main advantage is that it can be attached by a leg strap and goes underneath your clothing, allowing you the freedom to move about or leave your home. Only take over-the-counter or prescription medicines for pain, discomfort, or fever as directed by your health care provider.  SEEK MEDICAL CARE IF: You develop a low-grade fever. You experience spasms or leakage of urine with the spasms. SEEK IMMEDIATE MEDICAL CARE IF:  You develop chills or fever. Your catheter stops draining urine. Your catheter falls out. You start to develop increased bleeding that does not respond to rest and increased fluid intake. MAKE SURE YOU: Understand these instructions. Will watch your condition. Will get help right away if you are not doing well or get worse. Document Released: 01/25/2006 Document Revised: 11/16/2012 Document Reviewed: 07/07/2012 [facility] Patient Information 2014 [place], [place].  You have rectal bleeding.  You have continued problems, or you develop abdominal pain.  You have thin, pencil-like stools. SEEK IMMEDIATE MEDICAL CARE IF:  You have black or tarry stools. MAKE SURE YOU:   Understand these instructions.  Will watch your condition.  Will get help right away if you are not doing well or get worse. Document Released: 10/19/2003 Document Revised: 11/16/2012 Document Reviewed: 08/02/2012 [facility] Patient Information 2014 [place], [place].

## 2013-03-29 NOTE — ED Notes (Signed)
Small amount of hard stool noted in bedpan.  Pt assisted back to bed.  Family remains at bedside.

## 2013-03-29 NOTE — ED Notes (Signed)
Went to room to place foley catheter.  Pt was hesitant and wanted to discuss with family.  After much discussion family is now saying they want to have pt admitted.  Foley has not been placed. Dr. Manus Gunningancour notified. He will discuss with pt and family.

## 2013-03-29 NOTE — ED Notes (Signed)
14 french indwelling foley inserted, procedure explained, sterile procedures followed, inserted w/o difficulty, urine return

## 2013-03-29 NOTE — ED Notes (Signed)
Pt c/o hemorrhoid pain and constipation x1wk

## 2013-03-29 NOTE — ED Provider Notes (Signed)
CSN: [id number]     Arrival date & time 03/29/13  0631 History   First MD Initiated Contact with Patient 03/29/13 [phone]     Chief Complaint  Patient presents with  . Hemorrhoids     (Consider location/radiation/quality/duration/timing/severity/associated sxs/prior Treatment) HPI Comments: Patient complains of one week history of constipation and pain in her rectum. She thinks she has hemorrhoids. She has not seen any  blood in her stool. She reports having a small bowel movement yesterday and says she's been moving her bowels daily. She denies any vomiting. Denies any fever, chills, urinary or vaginal symptoms. Reports no previous abdominal surgeries. Has had problems with constipation and hemorrhoids in the past. Nothing makes the pain better or worse. She's able to eat and drink well.  The history is provided by the patient and a relative.    Past Medical History  Diagnosis Date  . Diabetes mellitus   . Hypertension   . Sarcoidosis   . Hypercholesteremia    Past Surgical History  Procedure Laterality Date  . Eye surgery      cataract   No family history on file. History  Substance Use Topics  . Smoking status: Former Games developer  . Smokeless tobacco: Not on file  . Alcohol Use: No   OB History   Grav Para Term Preterm Abortions TAB SAB Ect Mult Living                 Review of Systems  Constitutional: Negative for fever, activity change and appetite change.  Respiratory: Negative for cough, chest tightness and shortness of breath.   Cardiovascular: Negative for chest pain.  Gastrointestinal: Positive for abdominal pain, constipation and rectal pain. Negative for nausea, vomiting and blood in stool.  Genitourinary: Negative for dysuria, hematuria, vaginal bleeding and vaginal discharge.  Musculoskeletal: Positive for back pain. Negative for arthralgias and myalgias.  Skin: Negative for rash.  Neurological: Negative for dizziness, weakness and headaches.  A complete 10  system review of systems was obtained and all systems are negative except as noted in the HPI and PMH.      Allergies  Review of patient's allergies indicates no known allergies.  Home Medications   Current Outpatient Rx  Name  Route  Sig  Dispense  Refill  . bisacodyl (FLEET) 10 MG/30ML ENEM   Rectal   Place 10 mg rectally once.         . cephALEXin (KEFLEX) 500 MG capsule   Oral   Take 1 capsule (500 mg total) by mouth 4 (four) times daily.   28 capsule   0   . clonazePAM (KLONOPIN) 0.5 MG tablet   Oral   Take 0.5 mg by mouth 2 (two) times daily as needed. For anxiety         . diclofenac sodium (VOLTAREN) 1 % GEL   Topical   Apply 2 g topically as needed.         . dicyclomine (BENTYL) 20 MG tablet   Oral   Take 20 mg by mouth every 6 (six) hours.         Marland Kitchen glipiZIDE (GLUCOTROL) 10 MG tablet   Oral   Take 10 mg by mouth 2 (two) times daily before a meal.         . megestrol (MEGACE) 20 MG tablet   Oral   Take 20 mg by mouth daily.         . metFORMIN (GLUCOPHAGE) 500 MG tablet   Oral  Take 500 mg by mouth 2 (two) times daily with a meal.         . mirtazapine (REMERON) 30 MG tablet   Oral   Take 30 mg by mouth at bedtime.         Marland Kitchen NIFEdipine (PROCARDIA-XL/ADALAT CC) 30 MG 24 hr tablet   Oral   Take 30 mg by mouth daily.         Marland Kitchen omeprazole (PRILOSEC) 20 MG capsule   Oral   Take 20 mg by mouth daily.         . polyethylene glycol (MIRALAX) packet   Oral   Take 17 g by mouth daily.   14 each   0   . simvastatin (ZOCOR) 40 MG tablet   Oral   Take 40 mg by mouth every evening.         Marland Kitchen TRAMADOL HCL PO   Oral   Take by mouth.          BP 140/98  Pulse 94  Temp(Src) 98.3 F (36.8 C) (Oral)  Resp 16  SpO2 97% Physical Exam  Constitutional: She is oriented to person, place, and time. She appears well-developed and well-nourished. No distress.  Uncomfortable  HENT:  Head: Normocephalic and atraumatic.   Mouth/Throat: Oropharynx is clear and moist. No oropharyngeal exudate.  Eyes: Conjunctivae and EOM are normal. Pupils are equal, round, and reactive to light.  Neck: Normal range of motion. Neck supple.  Cardiovascular: Normal rate, regular rhythm, normal heart sounds and intact distal pulses.   Pulmonary/Chest: Effort normal and breath sounds normal. No respiratory distress.  Abdominal: Soft. She exhibits distension. There is tenderness. There is no rebound and no guarding.  Distended abdomen, diffusely tender, no peritoneal signs  Genitourinary:  No visible hemorrhoids. Fecal impaction with brown stool  Musculoskeletal: Normal range of motion. She exhibits no edema and no tenderness.  5/5 strength in bilateral lower extremities. Ankle plantar and dorsiflexion intact. Great toe extension intact bilaterally. +2 DP and PT pulses. +2 patellar reflexes bilaterally. Normal gait.   Neurological: She is alert and oriented to person, place, and time. No cranial nerve deficit. She exhibits normal muscle tone. Coordination normal.  Skin: Skin is warm.    ED Course  Fecal disimpaction Date/Time: 03/29/2013 7:05 AM Performed by: Glynn Octave Authorized by: Glynn Octave Consent: Verbal consent obtained. Risks and benefits: risks, benefits and alternatives were discussed Consent given by: patient Patient understanding: patient states understanding of the procedure being performed Patient consent: the patient's understanding of the procedure matches consent given Procedure consent: procedure consent matches procedure scheduled Patient identity confirmed: verbally with patient and provided demographic data Time out: Immediately prior to procedure a "time out" was called to verify the correct patient, procedure, equipment, support staff and site/side marked as required. Local anesthesia used: no Patient sedated: no Patient tolerance: Patient tolerated the procedure well with no immediate  complications.   (including critical care time) Labs Review Labs Reviewed  CBC WITH DIFFERENTIAL - Abnormal; Notable for the following:    RBC 3.30 (*)    Hemoglobin 10.5 (*)    HCT 30.6 (*)    All other components within normal limits  COMPREHENSIVE METABOLIC PANEL - Abnormal; Notable for the following:    Glucose, Bld 153 (*)    GFR calc non Af Amer 56 (*)    GFR calc Af Amer 65 (*)    All other components within normal limits  LIPASE, BLOOD  URINALYSIS, ROUTINE W REFLEX  MICROSCOPIC  OCCULT BLOOD X 1 CARD TO LAB, STOOL  CG4 I-STAT (LACTIC ACID)   Imaging Review Ct Abdomen Pelvis Wo Contrast  03/29/2013   CLINICAL DATA:  Abdominal pain with diabetes. Hypertension and sarcoidosis.  EXAM: CT ABDOMEN AND PELVIS WITHOUT CONTRAST  TECHNIQUE: Multidetector CT imaging of the abdomen and pelvis was performed following the standard protocol without intravenous contrast.  COMPARISON:  02/22/2013  FINDINGS: No focal abnormality is seen in the liver or spleen on this study performed without intravenous contrast material. The stomach, duodenum, pancreas, gallbladder, and adrenal glands are unremarkable. Right kidney is unremarkable. 2.1 x 2.1 cm water density lesion in the anterior left kidney is stable, likely represents a cyst.  There is atherosclerotic calcification in the wall of the abdominal aorta without aneurysm. No evidence for lymphadenopathy or free fluid in the abdomen.  Imaging through the pelvis shows marked bladder distention with the dome of the bladder up to the level of the umbilicus. Patient is noted to have a small umbilical hernia containing only fat. There is no free fluid in the pelvis. No evidence for pelvic sidewall lymphadenopathy.  Uterus is unremarkable.  No adnexal mass.  In the interval since the previous study, the patient has developed circumferential wall thickening in the rectum. There is perirectal edema/ inflammation in the soft tissues of the pelvic floor. No definite  perirectal lymphadenopathy.  Transverse colon is elongated and redundant. There is prominent stool in the right and transverse colon raising the question of clinical constipation.  Bone windows show degenerative changes in each hip. No worrisome lytic or sclerotic osseous abnormality.  IMPRESSION: Circumferential wall thickening in the rectum with perirectal edema/ inflammation in the soft tissues of the pelvic floor. Given that this area appeared relatively normal on the study from just about a month ago, this is most likely secondary to an infectious or inflammatory process of the rectum. The rapid interval change would not be entirely consistent with neoplasm as etiology.  Marked bladder distention. Dysfunction or outlet obstruction would be considerations.  Prominent stool volume raises the question of clinical constipation. .   Electronically Signed   By: Kennith Center M.D.   On: 03/29/2013 14:20   Dg Abd Acute W/chest  03/29/2013   CLINICAL DATA:  Abdominal pain, hemorrhoids pain, and constipation for 1 week. Sarcoidosis.  EXAM: ACUTE ABDOMEN SERIES (ABDOMEN 2 VIEW & CHEST 1 VIEW)  COMPARISON:  CT ABD/PELV WO CM dated 02/22/2013; DG PNEUMONIA CHEST 2V dated 12/19/2006; XR-CHEST 2 VIEWS dated 05/06/2012; Eduard Clos- FLAT & UPRIGHT dated 10/04/2011  FINDINGS: The cardiac silhouette is upper limits of normal in size. Mild fullness of the left hilum is unchanged. Again seen is bilateral hilar retraction due to upper lobe volume loss. Bilateral upper lobe bronchiectasis and scarring are similar to the prior study. There is no definite evidence of new airspace consolidation, pleural effusion, or pneumothorax.  There is no evidence of intraperitoneal free air. No dilated loops of bowel are seen suggestive of obstruction. There is a moderately large amount of retained stool throughout the colon. No acute osseous abnormality is seen.  IMPRESSION: 1. Stable appearance of chronic changes in the lungs. No evidence of acute  airspace disease. 2. Moderately large amount of stool in the colon, compatible with constipation.   Electronically Signed   By: Sebastian Ache   On: 03/29/2013 07:32    EKG Interpretation   None       MDM   Final diagnoses:  Fecal impaction  Urinary retention   Constipation and rectal pain for the past one week. Distended abdomen with fecal impaction.  CT scan one month ago did not show any acute abnormalities.  The abdominal series shows large amount of stool without evidence of obstruction.  Patient is digitally disimpacted multiple times in the ED. She's given multiple enemas. She remains uncomfortable and unable to have a BM.  Eventually, She did pass a large stool ball and no further stool was palpable. She continues to complain of abdominal distention and fullness. CT scan was obtained and shows distended urinary bladder as well as rectal wall thickening.  Foley catheter will be placed. >1000 ml clear urine drained. Patient's case discussed with her PCP Dr. Vinnie LevelKristin Hicks [facility].  Patient with recurrent episodes of fecal impaction, now with urinary retention. Equal strength in lower extremities. Patient is able ambulate.  Low suspicion for cauda equina or cord compression. Urinary retention likely 2/2 fecal impaction. Process has been ongoing for the past week. Dr. Willa RoughHicks states she will arrange outpatient MRI today or tomorrow and update family.  Patient reassessed and family updated multiple times.  Admission offered, but family comfortable with discharge home.  Per Dr. Willa RoughHicks, [facility]   Glynn OctaveStephen Fletcher Ostermiller, MD 03/29/13 [phone]

## 2013-03-31 NOTE — ED Notes (Signed)
Pt called stating Dr Willa RoughHicks office will not remove foley cath and [facility] cannot go to her home to remove it until tomorrow. Pt sts she does not want to wait until tomorrow and will return here later for it to be removed.

## 2013-07-02 ENCOUNTER — Emergency Department (HOSPITAL_BASED_OUTPATIENT_CLINIC_OR_DEPARTMENT_OTHER): Payer: Medicare Other

## 2013-07-02 ENCOUNTER — Encounter (HOSPITAL_BASED_OUTPATIENT_CLINIC_OR_DEPARTMENT_OTHER): Payer: Self-pay | Admitting: Emergency Medicine

## 2013-07-02 ENCOUNTER — Emergency Department (HOSPITAL_BASED_OUTPATIENT_CLINIC_OR_DEPARTMENT_OTHER)
Admission: EM | Admit: 2013-07-02 | Discharge: 2013-07-03 | Disposition: A | Discharge status: Home / Self Care | Payer: Medicare Other | Attending: Emergency Medicine | Admitting: Emergency Medicine

## 2013-07-02 DIAGNOSIS — N390 Urinary tract infection, site not specified: Secondary | ICD-10-CM | POA: Insufficient documentation

## 2013-07-02 DIAGNOSIS — IMO0002 Reserved for concepts with insufficient information to code with codable children: Secondary | ICD-10-CM | POA: Insufficient documentation

## 2013-07-02 DIAGNOSIS — E119 Type 2 diabetes mellitus without complications: Secondary | ICD-10-CM | POA: Insufficient documentation

## 2013-07-02 DIAGNOSIS — Z87891 Personal history of nicotine dependence: Secondary | ICD-10-CM | POA: Insufficient documentation

## 2013-07-02 DIAGNOSIS — I10 Essential (primary) hypertension: Secondary | ICD-10-CM | POA: Insufficient documentation

## 2013-07-02 DIAGNOSIS — Z792 Long term (current) use of antibiotics: Secondary | ICD-10-CM | POA: Insufficient documentation

## 2013-07-02 DIAGNOSIS — Z7982 Long term (current) use of aspirin: Secondary | ICD-10-CM | POA: Insufficient documentation

## 2013-07-02 DIAGNOSIS — Z791 Long term (current) use of non-steroidal anti-inflammatories (NSAID): Secondary | ICD-10-CM | POA: Insufficient documentation

## 2013-07-02 DIAGNOSIS — R1031 Right lower quadrant pain: Secondary | ICD-10-CM | POA: Insufficient documentation

## 2013-07-02 DIAGNOSIS — Z8739 Personal history of other diseases of the musculoskeletal system and connective tissue: Secondary | ICD-10-CM | POA: Insufficient documentation

## 2013-07-02 DIAGNOSIS — E7800 Pure hypercholesterolemia, unspecified: Secondary | ICD-10-CM | POA: Insufficient documentation

## 2013-07-02 DIAGNOSIS — Z8619 Personal history of other infectious and parasitic diseases: Secondary | ICD-10-CM | POA: Insufficient documentation

## 2013-07-02 DIAGNOSIS — R011 Cardiac murmur, unspecified: Secondary | ICD-10-CM | POA: Insufficient documentation

## 2013-07-02 HISTORY — DX: Spinal stenosis, site unspecified: M48.00

## 2013-07-02 HISTORY — DX: Reserved for concepts with insufficient information to code with codable children: IMO0002

## 2013-07-02 LAB — CBC WITH DIFFERENTIAL/PLATELET
BASOS ABS: 0 10*3/uL (ref 0.0–0.1)
BASOS PCT: 1 % (ref 0–1)
Eosinophils Absolute: 0.2 10*3/uL (ref 0.0–0.7)
Eosinophils Relative: 4 % (ref 0–5)
HEMATOCRIT: 31.2 % — AB (ref 36.0–46.0)
Hemoglobin: 10.6 g/dL — ABNORMAL LOW (ref 12.0–15.0)
LYMPHS PCT: 25 % (ref 12–46)
Lymphs Abs: 1.3 10*3/uL (ref 0.7–4.0)
MCH: 32 pg (ref 26.0–34.0)
MCHC: 34 g/dL (ref 30.0–36.0)
MCV: 94.3 fL (ref 78.0–100.0)
MONO ABS: 0.6 10*3/uL (ref 0.1–1.0)
Monocytes Relative: 11 % (ref 3–12)
Neutro Abs: 2.9 10*3/uL (ref 1.7–7.7)
Neutrophils Relative %: 59 % (ref 43–77)
Platelets: 184 10*3/uL (ref 150–400)
RBC: 3.31 MIL/uL — ABNORMAL LOW (ref 3.87–5.11)
RDW: 14.1 % (ref 11.5–15.5)
WBC: 5 10*3/uL (ref 4.0–10.5)

## 2013-07-02 LAB — URINALYSIS, ROUTINE W REFLEX MICROSCOPIC
Bilirubin Urine: NEGATIVE
GLUCOSE, UA: NEGATIVE mg/dL
HGB URINE DIPSTICK: NEGATIVE
KETONES UR: NEGATIVE mg/dL
Nitrite: NEGATIVE
PROTEIN: NEGATIVE mg/dL
Specific Gravity, Urine: 1.016 (ref 1.005–1.030)
Urobilinogen, UA: 0.2 mg/dL (ref 0.0–1.0)
pH: 8 (ref 5.0–8.0)

## 2013-07-02 LAB — COMPREHENSIVE METABOLIC PANEL
ALT: 9 U/L (ref 0–35)
AST: 17 U/L (ref 0–37)
Albumin: 4.4 g/dL (ref 3.5–5.2)
Alkaline Phosphatase: 40 U/L (ref 39–117)
BUN: 21 mg/dL (ref 6–23)
CALCIUM: 10.3 mg/dL (ref 8.4–10.5)
CO2: 26 mEq/L (ref 19–32)
Chloride: 101 mEq/L (ref 96–112)
Creatinine, Ser: 1.1 mg/dL (ref 0.50–1.10)
GFR calc Af Amer: 51 mL/min — ABNORMAL LOW (ref >90)
GFR, EST NON AFRICAN AMERICAN: 44 mL/min — AB (ref >90)
Glucose, Bld: 72 mg/dL (ref 70–99)
Potassium: 4.5 mEq/L (ref 3.7–5.3)
Sodium: 141 mEq/L (ref 137–147)
Total Bilirubin: <0.2 mg/dL — ABNORMAL LOW (ref 0.3–1.2)
Total Protein: 7.3 g/dL (ref 6.0–8.3)

## 2013-07-02 LAB — URINE MICROSCOPIC-ADD ON

## 2013-07-02 MED ORDER — IOHEXOL 300 MG/ML  SOLN
50.0000 mL | Freq: Once | INTRAMUSCULAR | Status: AC | PRN
  Administered 2013-07-02: 50 mL via ORAL

## 2013-07-02 MED ORDER — CEFTRIAXONE SODIUM 1 G IJ SOLR
INTRAMUSCULAR | Status: AC
  Administered 2013-07-02: 1000 mg via INTRAVENOUS
  Filled 2013-07-02: qty 10

## 2013-07-02 MED ORDER — CEFPODOXIME PROXETIL 100 MG PO TABS: 100.0000 mg | ORAL_TABLET | Freq: Two times a day (BID) | ORAL | Status: DC

## 2013-07-02 MED ORDER — DEXTROSE 5 % IV SOLN: 1.0000 g | Freq: Once | INTRAVENOUS | Status: DC

## 2013-07-02 MED ORDER — IOHEXOL 240 MG/ML SOLN: 50.0000 mL | Freq: Once | INTRAMUSCULAR | Status: DC | PRN

## 2013-07-02 MED ORDER — SODIUM CHLORIDE 0.9 % IV BOLUS (SEPSIS)
1000.0000 mL | Freq: Once | INTRAVENOUS | Status: AC
  Administered 2013-07-02: 1000 mL via INTRAVENOUS

## 2013-07-02 MED ORDER — IOHEXOL 300 MG/ML  SOLN
100.0000 mL | Freq: Once | INTRAMUSCULAR | Status: AC | PRN
  Administered 2013-07-02: 100 mL via INTRAVENOUS

## 2013-07-02 NOTE — ED Notes (Signed)
Patient states she is having right hip pain. Started 3 days ago, denies injury or fall.

## 2013-07-02 NOTE — ED Provider Notes (Addendum)
CSN: [id number]     Arrival date & time 07/02/13  1806 History  This chart was scribed for Shanna Cisco, MD by Nicholos Johns, ED scribe. This patient was seen in room MH10/MH10 and the patient's care was started at 8:24 PM.    Chief Complaint  Patient presents with  . Hip Pain   Patient is a 80 y.o. female presenting with abdominal pain. The history is provided by the patient. No language interpreter was used.  Abdominal Pain Pain location:  RLQ Associated symptoms: no chills, no cough, no diarrhea, no dysuria, no fatigue, no fever, no nausea, no sore throat and no vomiting   HPI Comments: Elizabeth Keller is a 80 y.o. female who presents to the Emergency Department complaining of intermittent RLQ pain, onset 3 days ago. Pain lasts a couple of minutes per episode. No change in pain with movement. States she's had pain like this before but not for this long. No recent trauma or injury related to current pain. Pt has IBS and so has frequent loose stools. States she normally has a decreased appetite due to 500 mg Metformin she takes twice daily. Was on 1000 mg twice daily before being admitted to the hospital earlier this month. Decreased to 500 after being discharged.  No hx of kidney stones. Takes BP medication. Denies fever, chills, nausea, vomiting, hematochezia, difficulty urinating.  Past Medical History  Diagnosis Date  . Diabetes mellitus   . Hypertension   . Sarcoidosis   . Hypercholesteremia   . Degenerative disk disease   . Spinal stenosis    Past Surgical History  Procedure Laterality Date  . Eye surgery      cataract   No family history on file. History  Substance Use Topics  . Smoking status: Former Games developer  . Smokeless tobacco: Not on file  . Alcohol Use: No   OB History   Grav Para Term Preterm Abortions TAB SAB Ect Mult Living                 Review of Systems  Constitutional: Negative for fever, chills, diaphoresis, activity change, appetite change and  fatigue.  HENT: Negative for congestion, facial swelling, rhinorrhea and sore throat.   Eyes: Negative for photophobia and discharge.  Respiratory: Negative for cough and chest tightness.   Cardiovascular: Negative for palpitations and leg swelling.  Gastrointestinal: Positive for abdominal pain. Negative for nausea, vomiting, diarrhea and blood in stool.  Endocrine: Negative for polydipsia and polyuria.  Genitourinary: Negative for dysuria, frequency, difficulty urinating and pelvic pain.  Musculoskeletal: Negative for arthralgias, back pain, neck pain and neck stiffness.  Skin: Negative for color change and wound.  Allergic/Immunologic: Negative for immunocompromised state.  Neurological: Negative for facial asymmetry, weakness and numbness.  Hematological: Does not bruise/bleed easily.  Psychiatric/Behavioral: Negative for confusion and agitation.   Allergies  Review of patient's allergies indicates no known allergies.  Home Medications   Prior to Admission medications   Medication Sig Start Date End Date Taking? Authorizing Provider  aspirin 81 MG tablet Take 81 mg by mouth daily.   Yes Historical Provider, MD  mometasone-formoterol (DULERA) 100-5 MCG/ACT AERO Inhale 2 puffs into the lungs 2 (two) times daily.   Yes Historical Provider, MD  bisacodyl (FLEET) 10 MG/30ML ENEM Place 10 mg rectally once.    Historical Provider, MD  cephALEXin (KEFLEX) 500 MG capsule Take 1 capsule (500 mg total) by mouth 4 (four) times daily. 02/22/13   Dagmar Hait, MD  clonazePAM (  KLONOPIN) 0.5 MG tablet Take 0.5 mg by mouth 2 (two) times daily as needed. For anxiety    Historical Provider, MD  diclofenac sodium (VOLTAREN) 1 % GEL Apply 2 g topically as needed.    Historical Provider, MD  dicyclomine (BENTYL) 20 MG tablet Take 20 mg by mouth every 6 (six) hours.    Historical Provider, MD  glipiZIDE (GLUCOTROL) 10 MG tablet Take 10 mg by mouth 2 (two) times daily before a meal.    Historical  Provider, MD  megestrol (MEGACE) 20 MG tablet Take 20 mg by mouth daily.    Historical Provider, MD  metFORMIN (GLUCOPHAGE) 500 MG tablet Take 500 mg by mouth 2 (two) times daily with a meal.    Historical Provider, MD  mirtazapine (REMERON) 30 MG tablet Take 30 mg by mouth at bedtime.    Historical Provider, MD  NIFEdipine (PROCARDIA-XL/ADALAT CC) 30 MG 24 hr tablet Take 30 mg by mouth daily.    Historical Provider, MD  omeprazole (PRILOSEC) 20 MG capsule Take 20 mg by mouth daily.    Historical Provider, MD  polyethylene glycol (MIRALAX) packet Take 17 g by mouth daily. 03/29/13   Glynn OctaveStephen Rancour, MD  simvastatin (ZOCOR) 40 MG tablet Take 40 mg by mouth every evening.    Historical Provider, MD  TRAMADOL HCL PO Take by mouth.    Historical Provider, MD   Triage vitals: BP 92/61  Pulse 87  Temp(Src) 98.6 F (37 C) (Oral)  Resp 18  Ht 5\' 6"  (1.676 m)  Wt 115 lb (52.164 kg)  BMI 18.57 kg/m2  SpO2 97% Physical Exam  Nursing note and vitals reviewed. Constitutional: She is oriented to person, place, and time. She appears well-developed and well-nourished. No distress.  HENT:  Head: Normocephalic.  Mouth/Throat: Oropharynx is clear and moist.  Eyes: Pupils are equal, round, and reactive to light.  Neck: Neck supple.  Cardiovascular: Normal rate and regular rhythm.   Murmur (bellowing) heard.  Systolic murmur is present with a grade of 2/6  split S2.  Pulmonary/Chest: Effort normal and breath sounds normal. No respiratory distress. She has no wheezes.  Abdominal: Soft. She exhibits no distension. There is tenderness in the right lower quadrant. There is no rigidity, no rebound and no guarding.  Musculoskeletal: She exhibits no edema and no tenderness.  Neurological: She is alert and oriented to person, place, and time.  Skin: Skin is warm and dry.  Psychiatric: She has a normal mood and affect.   ED Course  Procedures (including critical care time) DIAGNOSTIC STUDIES: Oxygen  Saturation is 97% on room air, normal by my interpretation.    COORDINATION OF CARE: At 8:32 PM: Discussed treatment plan with patient which includes a cat scan of the abdomen and blood work. Patient agrees.   Labs Review Labs Reviewed  URINALYSIS, ROUTINE W REFLEX MICROSCOPIC - Abnormal; Notable for the following:    Color, Urine AMBER (*)    APPearance CLOUDY (*)    Leukocytes, UA MODERATE (*)    All other components within normal limits  CBC WITH DIFFERENTIAL - Abnormal; Notable for the following:    RBC 3.31 (*)    Hemoglobin 10.6 (*)    HCT 31.2 (*)    All other components within normal limits  COMPREHENSIVE METABOLIC PANEL - Abnormal; Notable for the following:    Total Bilirubin <0.2 (*)    GFR calc non Af Amer 44 (*)    GFR calc Af Amer 51 (*)  All other components within normal limits  URINE MICROSCOPIC-ADD ON - Abnormal; Notable for the following:    Bacteria, UA MANY (*)    Casts HYALINE CASTS (*)    All other components within normal limits  URINE CULTURE    Imaging Review Dg Hip Complete Right  07/02/2013   CLINICAL DATA:  Right hip pain.  No injury.  EXAM: RIGHT HIP - COMPLETE 2+ VIEW  COMPARISON:  None.  FINDINGS: No fracture.  No dislocation.  There arthropathic changes of both hips, greater on the right. There is mild medial hip joint space narrowing. There are prominent marginal osteophytes from the right femoral head and there is bony productive change from the superior lateral right acetabulum, and to a lesser degree, on the left.  The bones are demineralized. SI joints and symphysis pubis are normally aligned.  Soft tissues are unremarkable.  IMPRESSION: 1. No fracture or acute finding. 2. Arthropathic changes of the hips, greater on the right.   Electronically Signed   By: Amie Portland M.D.   On: 07/02/2013 20:29   Ct Abdomen Pelvis W Contrast  07/02/2013   CLINICAL DATA:  Intermittent right lower quadrant abdominal pain.  EXAM: CT ABDOMEN AND PELVIS WITH  CONTRAST  TECHNIQUE: Multidetector CT imaging of the abdomen and pelvis was performed using the standard protocol following bolus administration of intravenous contrast.  CONTRAST:  27mL OMNIPAQUE IOHEXOL 300 MG/ML SOLN, OMNIPAQUE IOHEXOL 300 MG/ML SOLN  COMPARISON:  CT of the abdomen and pelvis performed 03/29/2008, and right upper quadrant abdominal ultrasound performed 06/14/2013  FINDINGS: Areas of mild emphysematous change are seen at the lung bases.  The liver and spleen are unremarkable in appearance. The gallbladder is within normal limits. The pancreas and adrenal glands are unremarkable.  The kidneys are unremarkable in appearance. There is no evidence of hydronephrosis. Evaluation for renal stones is limited given contrast in the renal calyces. No obstructing ureteral stones are seen. No perinephric stranding is appreciated.  No free fluid is identified. The small bowel is unremarkable in appearance. The stomach is within normal limits. No acute vascular abnormalities are seen. Diffuse calcification is seen along the abdominal aorta and its branches.  The appendix is normal in caliber, without evidence for appendicitis. The sigmoid colon is mildly redundant. The colon is grossly unremarkable in appearance.  The bladder is mildly distended and grossly unremarkable in appearance. The uterus is within normal limits. The ovaries are not well assessed, but appear grossly symmetric. No suspicious adnexal masses are seen. Likely calcified epiploic appendages are seen in the pelvic cul-de-sac. No inguinal lymphadenopathy is seen.  No acute osseous abnormalities are identified. Facet disease is noted along the lumbar spine.  IMPRESSION: 1. No acute abnormality seen to explain patient's symptoms. 2. Diffuse calcification seen along the abdominal aorta and its branches. 3. Areas of mild emphysematous change noted at the lung bases.   Electronically Signed   By: Roanna Raider M.D.   On: 07/02/2013 23:04      EKG Interpretation None      MDM   Final diagnoses:  RLQ abdominal pain  UTI (lower urinary tract infection)    Pt is a 80 y.o. female with Pmhx as above who presents with 3 days of intermittent, worsening RLQ pain (says hip, but pointing to abdomen).  No fever, chills, n/v. SHe has sone d/a at baseline.  On PE, VSS, pt in NAD. +RLQ ttp w/o rebound or guarding. CT ab/pelvis ordered to r/o appendicitis and  was nml. Urine infected. Will treat for UTI and ask her to follow closely w/ her PCP in 2-3 days. Return precautions given for new or worsening symptoms including worsening pain, fever, inability to tolerate PO, confusion.  (of Note, initial BP borderline low, but repeated as nml).      I personally performed the services described in this documentation, which was scribed in my presence. The recorded information has been reviewed and is accurate.       Shanna Cisco, MD 07/03/13 1240  Shanna Cisco, MD 07/03/13 [phone]

## 2013-07-02 NOTE — Discharge Instructions (Signed)
Abdominal Pain, Women °Abdominal (stomach, pelvic, or belly) pain can be caused by many things. It is important to tell your doctor: °· The location of the pain. °· Does it come and go or is it present all the time? °· Are there things that start the pain (eating certain foods, exercise)? °· Are there other symptoms associated with the pain (fever, nausea, vomiting, diarrhea)? °All of this is helpful to know when trying to find the cause of the pain. °CAUSES  °· Stomach: virus or bacteria infection, or ulcer. °· Intestine: appendicitis (inflamed appendix), regional ileitis (Crohn's disease), ulcerative colitis (inflamed colon), irritable bowel syndrome, diverticulitis (inflamed diverticulum of the colon), or cancer of the stomach or intestine. °· Gallbladder disease or stones in the gallbladder. °· Kidney disease, kidney stones, or infection. °· Pancreas infection or cancer. °· Fibromyalgia (pain disorder). °· Diseases of the female organs: °· Uterus: fibroid (non-cancerous) tumors or infection. °· Fallopian tubes: infection or tubal pregnancy. °· Ovary: cysts or tumors. °· Pelvic adhesions (scar tissue). °· Endometriosis (uterus lining tissue growing in the pelvis and on the pelvic organs). °· Pelvic congestion syndrome (female organs filling up with blood just before the menstrual period). °· Pain with the menstrual period. °· Pain with ovulation (producing an egg). °· Pain with an IUD (intrauterine device, birth control) in the uterus. °· Cancer of the female organs. °· Functional pain (pain not caused by a disease, may improve without treatment). °· Psychological pain. °· Depression. °DIAGNOSIS  °Your doctor will decide the seriousness of your pain by doing an examination. °· Blood tests. °· X-rays. °· Ultrasound. °· CT scan (computed tomography, special type of X-ray). °· MRI (magnetic resonance imaging). °· Cultures, for infection. °· Barium enema (dye inserted in the large intestine, to better view it with  X-rays). °· Colonoscopy (looking in intestine with a lighted tube). °· Laparoscopy (minor surgery, looking in abdomen with a lighted tube). °· Major abdominal exploratory surgery (looking in abdomen with a large incision). °TREATMENT  °The treatment will depend on the cause of the pain.  °· Many cases can be observed and treated at home. °· Over-the-counter medicines recommended by your caregiver. °· Prescription medicine. °· Antibiotics, for infection. °· Birth control pills, for painful periods or for ovulation pain. °· Hormone treatment, for endometriosis. °· Nerve blocking injections. °· Physical therapy. °· Antidepressants. °· Counseling with a psychologist or psychiatrist. °· Minor or major surgery. °HOME CARE INSTRUCTIONS  °· Do not take laxatives, unless directed by your caregiver. °· Take over-the-counter pain medicine only if ordered by your caregiver. Do not take aspirin because it can cause an upset stomach or bleeding. °· Try a clear liquid diet (broth or water) as ordered by your caregiver. Slowly move to a bland diet, as tolerated, if the pain is related to the stomach or intestine. °· Have a thermometer and take your temperature several times a day, and record it. °· Bed rest and sleep, if it helps the pain. °· Avoid sexual intercourse, if it causes pain. °· Avoid stressful situations. °· Keep your follow-up appointments and tests, as your caregiver orders. °· If the pain does not go away with medicine or surgery, you may try: °· Acupuncture. °· Relaxation exercises (yoga, meditation). °· Group therapy. °· Counseling. °SEEK MEDICAL CARE IF:  °· You notice certain foods cause stomach pain. °· Your home care treatment is not helping your pain. °· You need stronger pain medicine. °· You want your IUD removed. °· You feel faint or   lightheaded. °· You develop nausea and vomiting. °· You develop a rash. °· You are having side effects or an allergy to your medicine. °SEEK IMMEDIATE MEDICAL CARE IF:  °· Your  pain does not go away or gets worse. °· You have a fever. °· Your pain is felt only in portions of the abdomen. The right side could possibly be appendicitis. The left lower portion of the abdomen could be colitis or diverticulitis. °· You are passing blood in your stools (bright red or black tarry stools, with or without vomiting). °· You have blood in your urine. °· You develop chills, with or without a fever. °· You pass out. °MAKE SURE YOU:  °· Understand these instructions. °· Will watch your condition. °· Will get help right away if you are not doing well or get worse. °Document Released: 11/23/2006 Document Revised: 04/20/2011 Document Reviewed: 12/13/2008 °ExitCare® Patient Information ©2014 [employer]. ° °Urinary Tract Infection °Urinary tract infections (UTIs) can develop anywhere along your urinary tract. Your urinary tract is your body's drainage system for removing wastes and extra water. Your urinary tract includes two kidneys, two ureters, a bladder, and a urethra. Your kidneys are a pair of bean-shaped organs. Each kidney is about the size of your fist. They are located below your ribs, one on each side of your spine. °CAUSES °Infections are caused by microbes, which are microscopic organisms, including fungi, viruses, and bacteria. These organisms are so small that they can only be seen through a microscope. Bacteria are the microbes that most commonly cause UTIs. °SYMPTOMS  °Symptoms of UTIs may vary by age and gender of the patient and by the location of the infection. Symptoms in young women typically include a frequent and intense urge to urinate and a painful, burning feeling in the bladder or urethra during urination. Older women and men are more likely to be tired, shaky, and weak and have muscle aches and abdominal pain. A fever may mean the infection is in your kidneys. Other symptoms of a kidney infection include pain in your back or sides below the ribs, nausea, and  vomiting. °DIAGNOSIS °To diagnose a UTI, your caregiver will ask you about your symptoms. Your caregiver also will ask to provide a urine sample. The urine sample will be tested for bacteria and white blood cells. White blood cells are made by your body to help fight infection. °TREATMENT  °Typically, UTIs can be treated with medication. Because most UTIs are caused by a bacterial infection, they usually can be treated with the use of antibiotics. The choice of antibiotic and length of treatment depend on your symptoms and the type of bacteria causing your infection. °HOME CARE INSTRUCTIONS °· If you were prescribed antibiotics, take them exactly as your caregiver instructs you. Finish the medication even if you feel better after you have only taken some of the medication. °· Drink enough water and fluids to keep your urine clear or pale yellow. °· Avoid caffeine, tea, and carbonated beverages. They tend to irritate your bladder. °· Empty your bladder often. Avoid holding urine for long periods of time. °· Empty your bladder before and after sexual intercourse. °· After a bowel movement, women should cleanse from front to back. Use each tissue only once. °SEEK MEDICAL CARE IF:  °· You have back pain. °· You develop a fever. °· Your symptoms do not begin to resolve within 3 days. °SEEK IMMEDIATE MEDICAL CARE IF:  °· You have severe back pain or lower abdominal pain. °·   You develop chills. °· You have nausea or vomiting. °· You have continued burning or discomfort with urination. °MAKE SURE YOU:  °· Understand these instructions. °· Will watch your condition. °· Will get help right away if you are not doing well or get worse. °Document Released: 11/05/2004 Document Revised: 07/28/2011 Document Reviewed: 03/06/2011 °ExitCare® Patient Information ©2014 [employer]. ° °

## 2013-07-03 LAB — URINE CULTURE
COLONY COUNT: NO GROWTH
CULTURE: NO GROWTH

## 2013-08-10 ENCOUNTER — Emergency Department (HOSPITAL_BASED_OUTPATIENT_CLINIC_OR_DEPARTMENT_OTHER): Payer: Medicare Other

## 2013-08-10 ENCOUNTER — Emergency Department (HOSPITAL_BASED_OUTPATIENT_CLINIC_OR_DEPARTMENT_OTHER)
Admission: EM | Admit: 2013-08-10 | Discharge: 2013-08-10 | Disposition: A | Discharge status: Home / Self Care | Payer: Medicare Other | Attending: Emergency Medicine | Admitting: Emergency Medicine

## 2013-08-10 ENCOUNTER — Encounter (HOSPITAL_BASED_OUTPATIENT_CLINIC_OR_DEPARTMENT_OTHER): Payer: Self-pay | Admitting: Emergency Medicine

## 2013-08-10 DIAGNOSIS — E119 Type 2 diabetes mellitus without complications: Secondary | ICD-10-CM | POA: Insufficient documentation

## 2013-08-10 DIAGNOSIS — IMO0002 Reserved for concepts with insufficient information to code with codable children: Secondary | ICD-10-CM | POA: Insufficient documentation

## 2013-08-10 DIAGNOSIS — R1013 Epigastric pain: Secondary | ICD-10-CM

## 2013-08-10 DIAGNOSIS — Z7982 Long term (current) use of aspirin: Secondary | ICD-10-CM | POA: Insufficient documentation

## 2013-08-10 DIAGNOSIS — M4800 Spinal stenosis, site unspecified: Secondary | ICD-10-CM | POA: Insufficient documentation

## 2013-08-10 DIAGNOSIS — R0602 Shortness of breath: Secondary | ICD-10-CM | POA: Insufficient documentation

## 2013-08-10 DIAGNOSIS — R0789 Other chest pain: Secondary | ICD-10-CM | POA: Insufficient documentation

## 2013-08-10 DIAGNOSIS — I10 Essential (primary) hypertension: Secondary | ICD-10-CM | POA: Insufficient documentation

## 2013-08-10 DIAGNOSIS — Z87891 Personal history of nicotine dependence: Secondary | ICD-10-CM | POA: Insufficient documentation

## 2013-08-10 DIAGNOSIS — Z79899 Other long term (current) drug therapy: Secondary | ICD-10-CM | POA: Insufficient documentation

## 2013-08-10 DIAGNOSIS — E7800 Pure hypercholesterolemia, unspecified: Secondary | ICD-10-CM | POA: Insufficient documentation

## 2013-08-10 LAB — CBC WITH DIFFERENTIAL/PLATELET
BASOS ABS: 0 10*3/uL (ref 0.0–0.1)
Basophils Relative: 0 % (ref 0–1)
Eosinophils Absolute: 0 10*3/uL (ref 0.0–0.7)
Eosinophils Relative: 1 % (ref 0–5)
HCT: 33.5 % — ABNORMAL LOW (ref 36.0–46.0)
Hemoglobin: 11.5 g/dL — ABNORMAL LOW (ref 12.0–15.0)
LYMPHS ABS: 0.7 10*3/uL (ref 0.7–4.0)
LYMPHS PCT: 9 % — AB (ref 12–46)
MCH: 32.1 pg (ref 26.0–34.0)
MCHC: 34.3 g/dL (ref 30.0–36.0)
MCV: 93.6 fL (ref 78.0–100.0)
Monocytes Absolute: 0.4 10*3/uL (ref 0.1–1.0)
Monocytes Relative: 6 % (ref 3–12)
NEUTROS ABS: 5.9 10*3/uL (ref 1.7–7.7)
NEUTROS PCT: 84 % — AB (ref 43–77)
PLATELETS: 195 10*3/uL (ref 150–400)
RBC: 3.58 MIL/uL — AB (ref 3.87–5.11)
RDW: 13.5 % (ref 11.5–15.5)
WBC: 7.1 10*3/uL (ref 4.0–10.5)

## 2013-08-10 LAB — BASIC METABOLIC PANEL
ANION GAP: 14 (ref 5–15)
BUN: 26 mg/dL — ABNORMAL HIGH (ref 6–23)
CO2: 26 meq/L (ref 19–32)
Calcium: 10.1 mg/dL (ref 8.4–10.5)
Chloride: 101 mEq/L (ref 96–112)
Creatinine, Ser: 1.1 mg/dL (ref 0.50–1.10)
GFR calc Af Amer: 51 mL/min — ABNORMAL LOW (ref >90)
GFR calc non Af Amer: 44 mL/min — ABNORMAL LOW (ref >90)
Glucose, Bld: 125 mg/dL — ABNORMAL HIGH (ref 70–99)
POTASSIUM: 3.5 meq/L — AB (ref 3.7–5.3)
SODIUM: 141 meq/L (ref 137–147)

## 2013-08-10 LAB — TROPONIN I

## 2013-08-10 MED ORDER — ASPIRIN 81 MG PO CHEW
324.0000 mg | CHEWABLE_TABLET | Freq: Once | ORAL | Status: AC
  Administered 2013-08-10: 243 mg via ORAL
  Filled 2013-08-10: qty 4

## 2013-08-10 MED ORDER — NITROGLYCERIN 0.4 MG SL SUBL: 0.4000 mg | SUBLINGUAL_TABLET | SUBLINGUAL | Status: DC | PRN

## 2013-08-10 NOTE — ED Notes (Signed)
Pt reports tightness in her chest and SOB that started this morning.  Reports that it is better at this time.  [facility].

## 2013-08-10 NOTE — Discharge Instructions (Signed)
Shortness of Breath °Shortness of breath means you have trouble breathing. Shortness of breath needs medical care right away. °HOME CARE  °· Do not smoke. °· Avoid being around chemicals or things (paint fumes, dust) that may bother your breathing. °· Rest as needed. Slowly begin your normal activities. °· Only take medicines as told by your doctor. °· Keep all doctor visits as told. °GET HELP RIGHT AWAY IF:  °· Your shortness of breath gets worse. °· You feel lightheaded, pass out (faint), or have a cough that is not helped by medicine. °· You cough up blood. °· You have pain with breathing. °· You have pain in your chest, arms, shoulders, or belly (abdomen). °· You have a fever. °· You cannot walk up stairs or exercise the way you normally do. °· You do not get better in the time expected. °· You have a hard time doing normal activities even with rest. °· You have problems with your medicines. °· You have any new symptoms. °MAKE SURE YOU: °· Understand these instructions. °· Will watch your condition. °· Will get help right away if you are not doing well or get worse. °Document Released: 07/15/2007 Document Revised: 01/31/2013 Document Reviewed: 04/13/2011 °ExitCare® Patient Information ©2015 [employer]. This information is not intended to replace advice given to you by your health care provider. Make sure you discuss any questions you have with your health care provider. ° °

## 2013-08-10 NOTE — ED Provider Notes (Signed)
CSN: [id number]     Arrival date & time 08/10/13  1227 History   First MD Initiated Contact with Patient 08/10/13 1233     Chief Complaint  Patient presents with  . Shortness of Breath     (Consider location/radiation/quality/duration/timing/severity/associated sxs/prior Treatment) HPI Comments: Pt states that she had an onset of sob and tightness in her chest while trying to get dressed this morning. She states that she is still having mild tightness, but feels a lot better. She took her prednisone and anxiety medication at home. She states that she had similar symptoms earlier in the week and was admitted [facility] and was watched nothing was found. She also did a neb treatment without relief. Denies fever, cough, vomiting. Pt points to her upper abdomen when asked where her chest hurts. Denies swelling in extremity.   The history is provided by the patient. No language interpreter was used.    Past Medical History  Diagnosis Date  . Diabetes mellitus   . Hypertension   . Sarcoidosis   . Hypercholesteremia   . Degenerative disk disease   . Spinal stenosis    Past Surgical History  Procedure Laterality Date  . Eye surgery      cataract   History reviewed. No pertinent family history. History  Substance Use Topics  . Smoking status: Former Games developermoker  . Smokeless tobacco: Not on file  . Alcohol Use: No   OB History   Grav Para Term Preterm Abortions TAB SAB Ect Mult Living                 Review of Systems  Constitutional: Negative.   Respiratory: Positive for chest tightness and shortness of breath.   Cardiovascular: Positive for chest pain.      Allergies  Review of patient's allergies indicates no known allergies.  Home Medications   Prior to Admission medications   Medication Sig Start Date End Date Taking? Authorizing Provider  predniSONE (DELTASONE) 5 MG tablet Take 5 mg by mouth daily with breakfast.   Yes Historical Provider, MD  aspirin 81 MG tablet  Take 81 mg by mouth daily.    Historical Provider, MD  bisacodyl (FLEET) 10 MG/30ML ENEM Place 10 mg rectally once.    Historical Provider, MD  clonazePAM (KLONOPIN) 0.5 MG tablet Take 0.5 mg by mouth 2 (two) times daily as needed. For anxiety    Historical Provider, MD  diclofenac sodium (VOLTAREN) 1 % GEL Apply 2 g topically as needed.    Historical Provider, MD  dicyclomine (BENTYL) 20 MG tablet Take 20 mg by mouth every 6 (six) hours.    Historical Provider, MD  glipiZIDE (GLUCOTROL) 10 MG tablet Take 10 mg by mouth 2 (two) times daily before a meal.    Historical Provider, MD  megestrol (MEGACE) 20 MG tablet Take 20 mg by mouth daily.    Historical Provider, MD  mirtazapine (REMERON) 30 MG tablet Take 30 mg by mouth at bedtime.    Historical Provider, MD  mometasone-formoterol (DULERA) 100-5 MCG/ACT AERO Inhale 2 puffs into the lungs 2 (two) times daily.    Historical Provider, MD  NIFEdipine (PROCARDIA-XL/ADALAT CC) 30 MG 24 hr tablet Take 30 mg by mouth daily.    Historical Provider, MD  omeprazole (PRILOSEC) 20 MG capsule Take 20 mg by mouth daily.    Historical Provider, MD  simvastatin (ZOCOR) 40 MG tablet Take 40 mg by mouth every evening.    Historical Provider, MD  TRAMADOL HCL  PO Take by mouth.    Historical Provider, MD   BP 155/70  Pulse 80  Temp(Src) 98.7 F (37.1 C) (Oral)  Resp 18  SpO2 100% Physical Exam  Nursing note and vitals reviewed. Constitutional: She is oriented to person, place, and time. She appears well-developed and well-nourished.  HENT:  Head: Normocephalic and atraumatic.  Eyes: Conjunctivae and EOM are normal. Pupils are equal, round, and reactive to light.  Cardiovascular: Normal rate and regular rhythm.   Pulmonary/Chest: Effort normal and breath sounds normal.  Abdominal: Soft. Bowel sounds are normal. There is no tenderness.  Musculoskeletal: Normal range of motion.  Neurological: She is alert and oriented to person, place, and time.  Skin: Skin  is warm and dry.  Psychiatric: She has a normal mood and affect.    ED Course  Procedures (including critical care time) Labs Review Labs Reviewed  CBC WITH DIFFERENTIAL - Abnormal; Notable for the following:    RBC 3.58 (*)    Hemoglobin 11.5 (*)    HCT 33.5 (*)    Neutrophils Relative % 84 (*)    Lymphocytes Relative 9 (*)    All other components within normal limits  BASIC METABOLIC PANEL - Abnormal; Notable for the following:    Potassium 3.5 (*)    Glucose, Bld 125 (*)    BUN 26 (*)    GFR calc non Af Amer 44 (*)    GFR calc Af Amer 51 (*)    All other components within normal limits  TROPONIN I  TROPONIN I    Imaging Review Dg Chest 2 View  08/10/2013   CLINICAL DATA:  Shortness of breath and chest tightness since earlier today  EXAM: CHEST  2 VIEW  COMPARISON:  Portable chest of August 06, 2013 and CT angiogram of the chest of August 07, 2013  FINDINGS: There is chronic stable apical pleural density consistent with scarring. There is no alveolar pneumonia. There is stable retraction of the hilar structures superiorly. The cardiac silhouette is top-normal in size. The pulmonary vascularity is not engorged. There is no pleural effusion. The bony thorax is mildly osteopenic.  IMPRESSION: There is no acute pneumonia nor evidence of CHF. Considerable parenchymal scarring is consistent with the patient's known sarcoidosis.   Electronically Signed   By: David  [place]   On: 08/10/2013 13:20      MDM   Final diagnoses:  SOB (shortness of breath)  Epigastric abdominal pain    Pt is pain free at this time.obtaine record from [facility].pt recently had negative ct angio and cycled enzymes. Sob may be related to sarcordosis. Pt can follow up with pcp. Don't think cardiac in nature   Teressa LowerVrinda Emaley Applin, NP 08/10/13 2218

## 2013-08-12 NOTE — ED Provider Notes (Signed)
Medical screening examination/treatment/procedure(s) were conducted as a shared visit with non-physician practitioner(s) and myself.  I personally evaluated the patient during the encounter.  Episode of SOB while getting dressed.  Resolved with prednisone. No chest pain, cough, fever.  [facility] to [facility].  CTPE negative.  EKG unchanged today. Troponin negative x 2.  Lungs clear, no wheezing, no hypoxia.     Glynn OctaveStephen Erryn Dickison, MD 08/12/13 [phone]

## 2013-12-01 ENCOUNTER — Encounter (HOSPITAL_BASED_OUTPATIENT_CLINIC_OR_DEPARTMENT_OTHER): Payer: Self-pay | Admitting: Emergency Medicine

## 2013-12-01 ENCOUNTER — Emergency Department (HOSPITAL_BASED_OUTPATIENT_CLINIC_OR_DEPARTMENT_OTHER): Payer: Medicare Other

## 2013-12-01 ENCOUNTER — Emergency Department (HOSPITAL_BASED_OUTPATIENT_CLINIC_OR_DEPARTMENT_OTHER)
Admission: EM | Admit: 2013-12-01 | Discharge: 2013-12-01 | Disposition: A | Discharge status: Home / Self Care | Payer: Medicare Other | Attending: Emergency Medicine | Admitting: Emergency Medicine

## 2013-12-01 DIAGNOSIS — M791 Myalgia: Secondary | ICD-10-CM | POA: Diagnosis present

## 2013-12-01 DIAGNOSIS — Z791 Long term (current) use of non-steroidal anti-inflammatories (NSAID): Secondary | ICD-10-CM | POA: Insufficient documentation

## 2013-12-01 DIAGNOSIS — Z87891 Personal history of nicotine dependence: Secondary | ICD-10-CM | POA: Insufficient documentation

## 2013-12-01 DIAGNOSIS — K5909 Other constipation: Secondary | ICD-10-CM | POA: Diagnosis not present

## 2013-12-01 DIAGNOSIS — Z79899 Other long term (current) drug therapy: Secondary | ICD-10-CM | POA: Insufficient documentation

## 2013-12-01 DIAGNOSIS — M79605 Pain in left leg: Secondary | ICD-10-CM | POA: Insufficient documentation

## 2013-12-01 DIAGNOSIS — Z7952 Long term (current) use of systemic steroids: Secondary | ICD-10-CM | POA: Insufficient documentation

## 2013-12-01 DIAGNOSIS — M79604 Pain in right leg: Secondary | ICD-10-CM | POA: Insufficient documentation

## 2013-12-01 DIAGNOSIS — Z7982 Long term (current) use of aspirin: Secondary | ICD-10-CM | POA: Insufficient documentation

## 2013-12-01 DIAGNOSIS — I10 Essential (primary) hypertension: Secondary | ICD-10-CM | POA: Diagnosis not present

## 2013-12-01 DIAGNOSIS — Z8739 Personal history of other diseases of the musculoskeletal system and connective tissue: Secondary | ICD-10-CM | POA: Insufficient documentation

## 2013-12-01 DIAGNOSIS — E119 Type 2 diabetes mellitus without complications: Secondary | ICD-10-CM | POA: Diagnosis not present

## 2013-12-01 DIAGNOSIS — Z862 Personal history of diseases of the blood and blood-forming organs and certain disorders involving the immune mechanism: Secondary | ICD-10-CM | POA: Insufficient documentation

## 2013-12-01 DIAGNOSIS — R1013 Epigastric pain: Secondary | ICD-10-CM

## 2013-12-01 DIAGNOSIS — R0989 Other specified symptoms and signs involving the circulatory and respiratory systems: Secondary | ICD-10-CM | POA: Diagnosis not present

## 2013-12-01 LAB — CBC WITH DIFFERENTIAL/PLATELET
BASOS ABS: 0 10*3/uL (ref 0.0–0.1)
BASOS PCT: 1 % (ref 0–1)
EOS PCT: 6 % — AB (ref 0–5)
Eosinophils Absolute: 0.3 10*3/uL (ref 0.0–0.7)
HCT: 30.9 % — ABNORMAL LOW (ref 36.0–46.0)
Hemoglobin: 10.3 g/dL — ABNORMAL LOW (ref 12.0–15.0)
LYMPHS ABS: 1.1 10*3/uL (ref 0.7–4.0)
Lymphocytes Relative: 24 % (ref 12–46)
MCH: 30.6 pg (ref 26.0–34.0)
MCHC: 33.3 g/dL (ref 30.0–36.0)
MCV: 91.7 fL (ref 78.0–100.0)
Monocytes Absolute: 0.6 10*3/uL (ref 0.1–1.0)
Monocytes Relative: 13 % — ABNORMAL HIGH (ref 3–12)
NEUTROS PCT: 56 % (ref 43–77)
Neutro Abs: 2.5 10*3/uL (ref 1.7–7.7)
PLATELETS: 185 10*3/uL (ref 150–400)
RBC: 3.37 MIL/uL — AB (ref 3.87–5.11)
RDW: 14.4 % (ref 11.5–15.5)
WBC: 4.4 10*3/uL (ref 4.0–10.5)

## 2013-12-01 LAB — URINE MICROSCOPIC-ADD ON

## 2013-12-01 LAB — LIPASE, BLOOD: Lipase: 27 U/L (ref 11–59)

## 2013-12-01 LAB — COMPREHENSIVE METABOLIC PANEL WITH GFR
ALT: 10 U/L (ref 0–35)
AST: 20 U/L (ref 0–37)
Albumin: 3.9 g/dL (ref 3.5–5.2)
Alkaline Phosphatase: 72 U/L (ref 39–117)
Anion gap: 13 (ref 5–15)
BUN: 22 mg/dL (ref 6–23)
CO2: 26 meq/L (ref 19–32)
Calcium: 9.9 mg/dL (ref 8.4–10.5)
Chloride: 101 meq/L (ref 96–112)
Creatinine, Ser: 1.1 mg/dL (ref 0.50–1.10)
GFR calc Af Amer: 50 mL/min — ABNORMAL LOW
GFR calc non Af Amer: 44 mL/min — ABNORMAL LOW
Glucose, Bld: 86 mg/dL (ref 70–99)
Potassium: 4.2 meq/L (ref 3.7–5.3)
Sodium: 140 meq/L (ref 137–147)
Total Bilirubin: 0.2 mg/dL — ABNORMAL LOW (ref 0.3–1.2)
Total Protein: 7 g/dL (ref 6.0–8.3)

## 2013-12-01 LAB — URINALYSIS, ROUTINE W REFLEX MICROSCOPIC
Bilirubin Urine: NEGATIVE
GLUCOSE, UA: NEGATIVE mg/dL
Hgb urine dipstick: NEGATIVE
Ketones, ur: NEGATIVE mg/dL
Nitrite: NEGATIVE
PH: 7.5 (ref 5.0–8.0)
Protein, ur: NEGATIVE mg/dL
Specific Gravity, Urine: 1.008 (ref 1.005–1.030)
Urobilinogen, UA: 0.2 mg/dL (ref 0.0–1.0)

## 2013-12-01 LAB — TROPONIN I: Troponin I: <0.3 ng/mL

## 2013-12-01 LAB — PRO B NATRIURETIC PEPTIDE: Pro B Natriuretic peptide (BNP): 595.2 pg/mL — ABNORMAL HIGH (ref 0–450)

## 2013-12-01 MED ORDER — OXYCODONE-ACETAMINOPHEN 5-325 MG PO TABS
1.0000 | ORAL_TABLET | Freq: Once | ORAL | Status: AC
  Administered 2013-12-01: 1 via ORAL
  Filled 2013-12-01: qty 1

## 2013-12-01 MED ORDER — IOHEXOL 300 MG/ML  SOLN
50.0000 mL | Freq: Once | INTRAMUSCULAR | Status: AC | PRN
  Administered 2013-12-01: 50 mL via ORAL

## 2013-12-01 MED ORDER — IOHEXOL 300 MG/ML  SOLN
80.0000 mL | Freq: Once | INTRAMUSCULAR | Status: AC | PRN
  Administered 2013-12-01: 80 mL via INTRAVENOUS

## 2013-12-01 MED ORDER — OXYCODONE-ACETAMINOPHEN 5-325 MG PO TABS: 1.0000 | ORAL_TABLET | Freq: Four times a day (QID) | ORAL | Status: DC | PRN

## 2013-12-01 MED ORDER — SODIUM CHLORIDE 0.9 % IV BOLUS (SEPSIS): 500.0000 mL | Freq: Once | INTRAVENOUS | Status: DC

## 2013-12-01 NOTE — ED Notes (Signed)
General body aches. Epigastric pain.

## 2013-12-01 NOTE — ED Provider Notes (Signed)
CSN: [id number]     Arrival date & time 12/01/13  1925 History  This chart was scribed for Purvis Sheffield, MD by Elon Spanner, ED Scribe. This patient was seen in room MH09/MH09 and the patient's care was started at 7:50 PM.   Chief Complaint  Patient presents with  . Generalized Body Aches   The history is provided by the patient and a relative. No language interpreter was used.   HPI Comments: Elizabeth Keller is a 80 y.o. female who lives with a companion at home presents to the Emergency Department complaining of constant generalized body aches with worsening today.  Patient reports she typically has arm and lower back aches but today, when the aches spread to her BLE's below the knees, she decided to come to the ED.  Patient also complains of currently absent chest congestion onset 2 days ago.  She describes this pain as "like chest congestion". Patient took Tramadol today at 3:00 pm.  Patient's family denies history of CHF.  Patient denies fever, vomiting, cough.    Past Medical History  Diagnosis Date  . Diabetes mellitus   . Hypertension   . Sarcoidosis   . Hypercholesteremia   . Degenerative disk disease   . Spinal stenosis    Past Surgical History  Procedure Laterality Date  . Eye surgery      cataract   No family history on file. History  Substance Use Topics  . Smoking status: Former Games developer  . Smokeless tobacco: Not on file  . Alcohol Use: No   OB History   Grav Para Term Preterm Abortions TAB SAB Ect Mult Living                 Review of Systems  Constitutional: Negative for appetite change.  HENT: Negative for congestion, ear discharge and sinus pressure.   Eyes: Negative for discharge.  Cardiovascular: Positive for leg swelling.  Genitourinary: Negative for frequency.  Musculoskeletal: Negative for back pain.  Skin: Negative for rash.  Neurological: Negative for seizures and headaches.  Psychiatric/Behavioral: Negative for hallucinations.       Allergies  Review of patient's allergies indicates no known allergies.  Home Medications   Prior to Admission medications   Medication Sig Start Date End Date Taking? Authorizing Provider  aspirin 81 MG tablet Take 81 mg by mouth daily.    Historical Provider, MD  bisacodyl (FLEET) 10 MG/30ML ENEM Place 10 mg rectally once.    Historical Provider, MD  clonazePAM (KLONOPIN) 0.5 MG tablet Take 0.5 mg by mouth 2 (two) times daily as needed. For anxiety    Historical Provider, MD  diclofenac sodium (VOLTAREN) 1 % GEL Apply 2 g topically as needed.    Historical Provider, MD  dicyclomine (BENTYL) 20 MG tablet Take 20 mg by mouth every 6 (six) hours.    Historical Provider, MD  glipiZIDE (GLUCOTROL) 10 MG tablet Take 10 mg by mouth 2 (two) times daily before a meal.    Historical Provider, MD  megestrol (MEGACE) 20 MG tablet Take 20 mg by mouth daily.    Historical Provider, MD  mirtazapine (REMERON) 30 MG tablet Take 30 mg by mouth at bedtime.    Historical Provider, MD  mometasone-formoterol (DULERA) 100-5 MCG/ACT AERO Inhale 2 puffs into the lungs 2 (two) times daily.    Historical Provider, MD  NIFEdipine (PROCARDIA-XL/ADALAT CC) 30 MG 24 hr tablet Take 30 mg by mouth daily.    Historical Provider, MD  omeprazole (PRILOSEC) 20 MG capsule  Take 20 mg by mouth daily.    Historical Provider, MD  predniSONE (DELTASONE) 5 MG tablet Take 5 mg by mouth daily with breakfast.    Historical Provider, MD  simvastatin (ZOCOR) 40 MG tablet Take 40 mg by mouth every evening.    Historical Provider, MD  TRAMADOL HCL PO Take by mouth.    Historical Provider, MD   BP 147/68  Pulse 91  Temp(Src) 99.1 F (37.3 C) (Oral)  Resp 20  Ht 5\' 4"  (1.626 m)  Wt 115 lb (52.164 kg)  BMI 19.73 kg/m2  SpO2 100% Physical Exam  Nursing note and vitals reviewed. Constitutional: She is oriented to person, place, and time. She appears well-developed and well-nourished.  Non-toxic appearance. She does not appear  ill. No distress.  HENT:  Head: Normocephalic and atraumatic.  Right Ear: External ear normal.  Left Ear: External ear normal.  Nose: Nose normal. No mucosal edema or rhinorrhea.  Mouth/Throat: Oropharynx is clear and moist and mucous membranes are normal. No dental abscesses or uvula swelling.  Eyes: Conjunctivae and EOM are normal. Pupils are equal, round, and reactive to light.  Neck: Normal range of motion and full passive range of motion without pain. Neck supple.  Cardiovascular: Normal rate, regular rhythm and normal heart sounds.  Exam reveals no gallop and no friction rub.   No murmur heard. Pulmonary/Chest: Effort normal and breath sounds normal. No respiratory distress. She has no wheezes. She has no rhonchi. She has no rales. She exhibits no tenderness and no crepitus.  Abdominal: Soft. Normal appearance and bowel sounds are normal. She exhibits no distension. There is tenderness. There is no rebound and no guarding.  Mild epigastric TTP.   Musculoskeletal: Normal range of motion. She exhibits edema. She exhibits no tenderness.  Mild pitting edema in the distal LE's extending up to the mid-shin. LE's otherwise symmetric.  2+ distal pulses in LE's   Neurological: She is alert and oriented to person, place, and time. She has normal strength. No cranial nerve deficit.  Skin: Skin is warm, dry and intact. No rash noted. No erythema. No pallor.  Psychiatric: She has a normal mood and affect. Her speech is normal and behavior is normal. Her mood appears not anxious.    ED Course  Procedures (including critical care time)  DIAGNOSTIC STUDIES: Oxygen Saturation is 100% on RA, normal by my interpretation.    COORDINATION OF CARE:  8:22 PM Will order imaging and pain medication.  Patient acknowledges and agrees with plan.    Labs Review Labs Reviewed  CBC WITH DIFFERENTIAL - Abnormal; Notable for the following:    RBC 3.37 (*)    Hemoglobin 10.3 (*)    HCT 30.9 (*)    Monocytes  Relative 13 (*)    Eosinophils Relative 6 (*)    All other components within normal limits  COMPREHENSIVE METABOLIC PANEL - Abnormal; Notable for the following:    Total Bilirubin 0.2 (*)    GFR calc non Af Amer 44 (*)    GFR calc Af Amer 50 (*)    All other components within normal limits  URINALYSIS, ROUTINE W REFLEX MICROSCOPIC - Abnormal; Notable for the following:    Leukocytes, UA SMALL (*)    All other components within normal limits  PRO B NATRIURETIC PEPTIDE - Abnormal; Notable for the following:    Pro B Natriuretic peptide (BNP) 595.2 (*)    All other components within normal limits  TROPONIN I  LIPASE, BLOOD  URINE MICROSCOPIC-ADD ON    Imaging Review Dg Chest 2 View  12/01/2013   CLINICAL DATA:  80 year old female with pain all over this evening, increased pain with walking, and chest congestion. History of sarcoidosis.  EXAM: CHEST  2 VIEW  COMPARISON:  Chest x-ray a 08/30/2013.  FINDINGS: There is again a pattern of upper lung the reticulation, bilateral upper lobe cylindrical and varicose bronchiectasis, upward retraction of hilar structures and generalized upper lung architectural distortion, compatible with the given clinical history of sarcoidosis. Dilated pulmonary arteries are again noted, suggesting pulmonary arterial hypertension. Ectatic and atherosclerotic thoracic aorta again noted. No acute consolidative airspace disease. No pleural effusions.  IMPRESSION: 1. Chronic changes related to underlying sarcoidosis redemonstrated, without definite findings of acute cardiopulmonary disease. 2. Atherosclerosis.   Electronically Signed   By: Trudie Reedaniel  Entrikin M.D.   On: 12/01/2013 20:46   Ct Abdomen Pelvis W Contrast  12/01/2013   CLINICAL DATA:  Generalized body aches and epigastric pain. History of sarcoidosis.  EXAM: CT ABDOMEN AND PELVIS WITH CONTRAST  TECHNIQUE: Multidetector CT imaging of the abdomen and pelvis was performed using the standard protocol following bolus  administration of intravenous contrast.  CONTRAST:  50mL OMNIPAQUE IOHEXOL 300 MG/ML SOLN, 80mL OMNIPAQUE IOHEXOL 300 MG/ML SOLN  COMPARISON:  CT abdomen pelvis 07/02/2013. Chest radiograph 12/01/2013.  FINDINGS: Lung bases demonstrate some architectural distortion, which could be secondary to longstanding sarcoidosis. Cardiomegaly is stable. Negative for pleural or pericardial effusion.  The liver is normal in size and enhancement. Negative for biliary ductal dilatation. Stable 2 cm cyst extending anteriorly from the lower pole the left kidney. Stable tiny cortical low-density lesion in the superior pole of the left kidney, likely a cyst but too small to characterize. Negative for hydronephrosis bilaterally.  Adrenal glands, spleen, pancreas, and gallbladder arm within normal limits. Stomach unremarkable. Small bowel loops normal in caliber and wall thickness.  Large volume of stool throughout the colon raises possibility of constipation. No evidence of bowel obstruction. Normal appendix.  The urinary bladder is distended. Normal bladder wall thickness. Uterus unremarkable. No adnexal mass identified. Negative for ascites or lymphadenopathy.  There is extensive atherosclerotic change of the abdominal aorta. There is ectasia of the distal abdominal aorta, without aneurysm. There is heavy atherosclerotic calcification of the iliac vasculature bilaterally without aneurysm.  There are degenerative changes about the pubic symphysis bilaterally. Bilateral hip osteoarthritis, right greater than left. Mild typical degenerative changes of the lumbar spine for patient age. Vertebral bodies are normal in height.  IMPRESSION: Large amount of stool throughout the colon. Question if the patient could be constipated.  No acute inflammatory changes, suspicious mass lesion, or bowel obstruction.  Aortoiliac atherosclerosis.  Cardiomegaly.   Electronically Signed   By: Britta MccreedySusan  Turner M.D.   On: 12/01/2013 22:14      EKG  Interpretation   Date/Time:  Friday December 01 2013 19:51:14 EDT Ventricular Rate:  85 PR Interval:  192 QRS Duration: 144 QT Interval:  414 QTC Calculation: 492 R Axis:   -74 Text Interpretation:  Normal sinus rhythm Right bundle branch block Left  anterior fascicular block  Bifascicular block Septal infarct , age  undetermined t wave inversion in lead III no other significant changes  Confirmed by Landy Dunnavant  MD, Shaquinta Peruski ([id number]) on 12/01/2013 9:17:04 PM      MDM   Final diagnoses:  Chest congestion  Epigastric pain  Other constipation  Bilateral leg pain    10:45 PM 80 y.o. female who presents with  multiple complaints including generalized body aches. She also complains of some nonspecific abdominal pain and has some mild tenderness in epigastric area. She complains of chest congestion but not pain and is currently not having any chest pain or shortness of breath. She has some mild pitting edema in her lower extremities which is chronic and not significantly changed from baseline per the family. She denies any fevers, vomiting, or diarrhea. We'll plan on getting screening labs and imaging. Low suspicion for cardiac cause of her symptoms. Percocet for pain. Pt takes tramadol at home.   10:53 PM: I interpreted/reviewed the labs and/or imaging which were non-contributory. Pt feeling better after percocet. Will rec this for a few days as opposed to tramadol, family understands. CT also showing constipation. Will have patient continue taking her MiraLAX daily as she is not currently taking it.  I have discussed the diagnosis/risks/treatment options with the patient and family and believe the pt to be eligible for discharge home to follow-up with her pcp next week. We also discussed returning to the ED immediately if new or worsening sx occur. We discussed the sx which are most concerning (e.g., worsening pain, fever, vomiting) that necessitate immediate return. Medications administered to the  patient during their visit and any new prescriptions provided to the patient are listed below.  Medications given during this visit Medications  oxyCODONE-acetaminophen (PERCOCET/ROXICET) 5-325 MG per tablet 1 tablet (1 tablet Oral Given 12/01/13 2108)  iohexol (OMNIPAQUE) 300 MG/ML solution 50 mL (50 mLs Oral Contrast Given 12/01/13 2100)  iohexol (OMNIPAQUE) 300 MG/ML solution 80 mL (80 mLs Intravenous Contrast Given 12/01/13 2155)    New Prescriptions   OXYCODONE-ACETAMINOPHEN (PERCOCET) 5-325 MG PER TABLET    Take 1 tablet by mouth every 6 (six) hours as needed for moderate pain.      I personally performed the services described in this documentation, which was scribed in my presence. The recorded information has been reviewed and is accurate.    Purvis SheffieldForrest Braylynn Ghan, MD 12/01/13 2255

## 2013-12-01 NOTE — ED Notes (Signed)
C/o hurting all over onset this pm,  Increased pain w walking,  Feels like chest congestion  Denies cough/runny nose

## 2013-12-01 NOTE — Discharge Instructions (Signed)
Abdominal Pain, Women °Abdominal (stomach, pelvic, or belly) pain can be caused by many things. It is important to tell your doctor: °· The location of the pain. °· Does it come and go or is it present all the time? °· Are there things that start the pain (eating certain foods, exercise)? °· Are there other symptoms associated with the pain (fever, nausea, vomiting, diarrhea)? °All of this is helpful to know when trying to find the cause of the pain. °CAUSES  °· Stomach: virus or bacteria infection, or ulcer. °· Intestine: appendicitis (inflamed appendix), regional ileitis (Crohn's disease), ulcerative colitis (inflamed colon), irritable bowel syndrome, diverticulitis (inflamed diverticulum of the colon), or cancer of the stomach or intestine. °· Gallbladder disease or stones in the gallbladder. °· Kidney disease, kidney stones, or infection. °· Pancreas infection or cancer. °· Fibromyalgia (pain disorder). °· Diseases of the female organs: °¨ Uterus: fibroid (non-cancerous) tumors or infection. °¨ Fallopian tubes: infection or tubal pregnancy. °¨ Ovary: cysts or tumors. °¨ Pelvic adhesions (scar tissue). °¨ Endometriosis (uterus lining tissue growing in the pelvis and on the pelvic organs). °¨ Pelvic congestion syndrome (female organs filling up with blood just before the menstrual period). °¨ Pain with the menstrual period. °¨ Pain with ovulation (producing an egg). °¨ Pain with an IUD (intrauterine device, birth control) in the uterus. °¨ Cancer of the female organs. °· Functional pain (pain not caused by a disease, may improve without treatment). °· Psychological pain. °· Depression. °DIAGNOSIS  °Your doctor will decide the seriousness of your pain by doing an examination. °· Blood tests. °· X-rays. °· Ultrasound. °· CT scan (computed tomography, special type of X-ray). °· MRI (magnetic resonance imaging). °· Cultures, for infection. °· Barium enema (dye inserted in the large intestine, to better view it with  X-rays). °· Colonoscopy (looking in intestine with a lighted tube). °· Laparoscopy (minor surgery, looking in abdomen with a lighted tube). °· Major abdominal exploratory surgery (looking in abdomen with a large incision). °TREATMENT  °The treatment will depend on the cause of the pain.  °· Many cases can be observed and treated at home. °· Over-the-counter medicines recommended by your caregiver. °· Prescription medicine. °· Antibiotics, for infection. °· Birth control pills, for painful periods or for ovulation pain. °· Hormone treatment, for endometriosis. °· Nerve blocking injections. °· Physical therapy. °· Antidepressants. °· Counseling with a psychologist or psychiatrist. °· Minor or major surgery. °HOME CARE INSTRUCTIONS  °· Do not take laxatives, unless directed by your caregiver. °· Take over-the-counter pain medicine only if ordered by your caregiver. Do not take aspirin because it can cause an upset stomach or bleeding. °· Try a clear liquid diet (broth or water) as ordered by your caregiver. Slowly move to a bland diet, as tolerated, if the pain is related to the stomach or intestine. °· Have a thermometer and take your temperature several times a day, and record it. °· Bed rest and sleep, if it helps the pain. °· Avoid sexual intercourse, if it causes pain. °· Avoid stressful situations. °· Keep your follow-up appointments and tests, as your caregiver orders. °· If the pain does not go away with medicine or surgery, you may try: °¨ Acupuncture. °¨ Relaxation exercises (yoga, meditation). °¨ Group therapy. °¨ Counseling. °SEEK MEDICAL CARE IF:  °· You notice certain foods cause stomach pain. °· Your home care treatment is not helping your pain. °· You need stronger pain medicine. °· You want your IUD removed. °· You feel faint or   lightheaded. °· You develop nausea and vomiting. °· You develop a rash. °· You are having side effects or an allergy to your medicine. °SEEK IMMEDIATE MEDICAL CARE IF:  °· Your  pain does not go away or gets worse. °· You have a fever. °· Your pain is felt only in portions of the abdomen. The right side could possibly be appendicitis. The left lower portion of the abdomen could be colitis or diverticulitis. °· You are passing blood in your stools (bright red or black tarry stools, with or without vomiting). °· You have blood in your urine. °· You develop chills, with or without a fever. °· You pass out. °MAKE SURE YOU:  °· Understand these instructions. °· Will watch your condition. °· Will get help right away if you are not doing well or get worse. °Document Released: 11/23/2006 Document Revised: 06/12/2013 Document Reviewed: 12/13/2008 °[employer]® Patient Information ©2015 [employer]. This information is not intended to replace advice given to you by your health care provider. Make sure you discuss any questions you have with your health care provider. ° °

## 2013-12-29 ENCOUNTER — Emergency Department (HOSPITAL_BASED_OUTPATIENT_CLINIC_OR_DEPARTMENT_OTHER)
Admission: EM | Admit: 2013-12-29 | Discharge: 2013-12-29 | Discharge status: Against Medical Advice | Payer: Medicare Other | Attending: Emergency Medicine | Admitting: Emergency Medicine

## 2013-12-29 ENCOUNTER — Encounter (HOSPITAL_BASED_OUTPATIENT_CLINIC_OR_DEPARTMENT_OTHER): Payer: Self-pay | Admitting: *Deleted

## 2013-12-29 DIAGNOSIS — R52 Pain, unspecified: Secondary | ICD-10-CM | POA: Insufficient documentation

## 2013-12-29 DIAGNOSIS — I10 Essential (primary) hypertension: Secondary | ICD-10-CM | POA: Insufficient documentation

## 2013-12-29 DIAGNOSIS — E119 Type 2 diabetes mellitus without complications: Secondary | ICD-10-CM | POA: Insufficient documentation

## 2013-12-29 NOTE — ED Notes (Signed)
Patient states she has had generalized body aches for over one month, was seen by her PCP and is not getting any better.  C/o bilateral leg pain, and numbness in her hands.  Some dizziness for which she has some pills for, but would like some "fresh pills" for her dizziness.

## 2014-01-01 ENCOUNTER — Encounter (HOSPITAL_BASED_OUTPATIENT_CLINIC_OR_DEPARTMENT_OTHER): Payer: Self-pay | Admitting: *Deleted

## 2014-01-01 ENCOUNTER — Emergency Department (HOSPITAL_BASED_OUTPATIENT_CLINIC_OR_DEPARTMENT_OTHER): Payer: Medicare Other

## 2014-01-01 ENCOUNTER — Emergency Department (HOSPITAL_BASED_OUTPATIENT_CLINIC_OR_DEPARTMENT_OTHER)
Admission: EM | Admit: 2014-01-01 | Discharge: 2014-01-01 | Disposition: A | Discharge status: Home / Self Care | Payer: Medicare Other | Attending: Emergency Medicine | Admitting: Emergency Medicine

## 2014-01-01 DIAGNOSIS — Z8619 Personal history of other infectious and parasitic diseases: Secondary | ICD-10-CM | POA: Insufficient documentation

## 2014-01-01 DIAGNOSIS — R200 Anesthesia of skin: Secondary | ICD-10-CM | POA: Insufficient documentation

## 2014-01-01 DIAGNOSIS — E780 Pure hypercholesterolemia: Secondary | ICD-10-CM | POA: Insufficient documentation

## 2014-01-01 DIAGNOSIS — Z87891 Personal history of nicotine dependence: Secondary | ICD-10-CM | POA: Insufficient documentation

## 2014-01-01 DIAGNOSIS — Z79899 Other long term (current) drug therapy: Secondary | ICD-10-CM | POA: Insufficient documentation

## 2014-01-01 DIAGNOSIS — Z7952 Long term (current) use of systemic steroids: Secondary | ICD-10-CM | POA: Diagnosis not present

## 2014-01-01 DIAGNOSIS — Z7951 Long term (current) use of inhaled steroids: Secondary | ICD-10-CM | POA: Diagnosis not present

## 2014-01-01 DIAGNOSIS — M519 Unspecified thoracic, thoracolumbar and lumbosacral intervertebral disc disorder: Secondary | ICD-10-CM | POA: Insufficient documentation

## 2014-01-01 DIAGNOSIS — I10 Essential (primary) hypertension: Secondary | ICD-10-CM | POA: Insufficient documentation

## 2014-01-01 DIAGNOSIS — R531 Weakness: Secondary | ICD-10-CM | POA: Diagnosis present

## 2014-01-01 DIAGNOSIS — R0781 Pleurodynia: Secondary | ICD-10-CM | POA: Diagnosis not present

## 2014-01-01 DIAGNOSIS — E119 Type 2 diabetes mellitus without complications: Secondary | ICD-10-CM | POA: Diagnosis not present

## 2014-01-01 DIAGNOSIS — M2550 Pain in unspecified joint: Secondary | ICD-10-CM

## 2014-01-01 DIAGNOSIS — Z7982 Long term (current) use of aspirin: Secondary | ICD-10-CM | POA: Diagnosis not present

## 2014-01-01 DIAGNOSIS — G629 Polyneuropathy, unspecified: Secondary | ICD-10-CM

## 2014-01-01 LAB — COMPREHENSIVE METABOLIC PANEL WITH GFR
ALT: 10 U/L (ref 0–35)
AST: 18 U/L (ref 0–37)
Albumin: 4 g/dL (ref 3.5–5.2)
Alkaline Phosphatase: 61 U/L (ref 39–117)
Anion gap: 14 (ref 5–15)
BUN: 19 mg/dL (ref 6–23)
CO2: 27 meq/L (ref 19–32)
Calcium: 10 mg/dL (ref 8.4–10.5)
Chloride: 103 meq/L (ref 96–112)
Creatinine, Ser: 1.2 mg/dL — ABNORMAL HIGH (ref 0.50–1.10)
GFR calc Af Amer: 45 mL/min — ABNORMAL LOW
GFR calc non Af Amer: 39 mL/min — ABNORMAL LOW
Glucose, Bld: 150 mg/dL — ABNORMAL HIGH (ref 70–99)
Potassium: 3.8 meq/L (ref 3.7–5.3)
Sodium: 144 meq/L (ref 137–147)
Total Bilirubin: 0.3 mg/dL (ref 0.3–1.2)
Total Protein: 7.1 g/dL (ref 6.0–8.3)

## 2014-01-01 LAB — CBC WITH DIFFERENTIAL/PLATELET
Basophils Absolute: 0 K/uL (ref 0.0–0.1)
Basophils Relative: 0 % (ref 0–1)
Eosinophils Absolute: 0.2 K/uL (ref 0.0–0.7)
Eosinophils Relative: 4 % (ref 0–5)
HCT: 34.4 % — ABNORMAL LOW (ref 36.0–46.0)
Hemoglobin: 11.3 g/dL — ABNORMAL LOW (ref 12.0–15.0)
Lymphocytes Relative: 20 % (ref 12–46)
Lymphs Abs: 0.9 K/uL (ref 0.7–4.0)
MCH: 30.6 pg (ref 26.0–34.0)
MCHC: 32.8 g/dL (ref 30.0–36.0)
MCV: 93.2 fL (ref 78.0–100.0)
Monocytes Absolute: 0.6 K/uL (ref 0.1–1.0)
Monocytes Relative: 14 % — ABNORMAL HIGH (ref 3–12)
Neutro Abs: 2.6 K/uL (ref 1.7–7.7)
Neutrophils Relative %: 62 % (ref 43–77)
Platelets: 196 K/uL (ref 150–400)
RBC: 3.69 MIL/uL — ABNORMAL LOW (ref 3.87–5.11)
RDW: 14.8 % (ref 11.5–15.5)
WBC: 4.2 K/uL (ref 4.0–10.5)

## 2014-01-01 LAB — PRO B NATRIURETIC PEPTIDE: Pro B Natriuretic peptide (BNP): 569.3 pg/mL — ABNORMAL HIGH (ref 0–450)

## 2014-01-01 LAB — TROPONIN I: Troponin I: <0.3 ng/mL (ref <0.30)

## 2014-01-01 MED ORDER — GABAPENTIN 100 MG PO CAPS: 100.0000 mg | ORAL_CAPSULE | Freq: Two times a day (BID) | ORAL | Status: DC

## 2014-01-01 MED ORDER — ALBUTEROL SULFATE (2.5 MG/3ML) 0.083% IN NEBU
2.5000 mg | INHALATION_SOLUTION | Freq: Once | RESPIRATORY_TRACT | Status: AC
  Administered 2014-01-01: 2.5 mg via RESPIRATORY_TRACT
  Filled 2014-01-01: qty 3

## 2014-01-01 NOTE — Discharge Instructions (Signed)
Arthritis, Nonspecific °Arthritis is inflammation of a joint. This usually means pain, redness, warmth or swelling are present. One or more joints may be involved. There are a number of types of arthritis. Your caregiver may not be able to tell what type of arthritis you have right away. °CAUSES  °The most common cause of arthritis is the wear and tear on the joint (osteoarthritis). This causes damage to the cartilage, which can break down over time. The knees, hips, back and neck are most often affected by this type of arthritis. °Other types of arthritis and common causes of joint pain include: °· Sprains and other injuries near the joint. Sometimes minor sprains and injuries cause pain and swelling that develop hours later. °· Rheumatoid arthritis. This affects hands, feet and knees. It usually affects both sides of your body at the same time. It is often associated with chronic ailments, fever, weight loss and general weakness. °· Crystal arthritis. Gout and pseudo gout can cause occasional acute severe pain, redness and swelling in the foot, ankle, or knee. °· Infectious arthritis. Bacteria can get into a joint through a break in overlying skin. This can cause infection of the joint. Bacteria and viruses can also spread through the blood and affect your joints. °· Drug, infectious and allergy reactions. Sometimes joints can become mildly painful and slightly swollen with these types of illnesses. °SYMPTOMS  °· Pain is the main symptom. °· Your joint or joints can also be red, swollen and warm or hot to the touch. °· You may have a fever with certain types of arthritis, or even feel overall ill. °· The joint with arthritis will hurt with movement. Stiffness is present with some types of arthritis. °DIAGNOSIS  °Your caregiver will suspect arthritis based on your description of your symptoms and on your exam. Testing may be needed to find the type of arthritis: °· Blood and sometimes urine tests. °· X-ray tests  and sometimes CT or MRI scans. °· Removal of fluid from the joint (arthrocentesis) is done to check for bacteria, crystals or other causes. Your caregiver (or a specialist) will numb the area over the joint with a local anesthetic, and use a needle to remove joint fluid for examination. This procedure is only minimally uncomfortable. °· Even with these tests, your caregiver may not be able to tell what kind of arthritis you have. Consultation with a specialist (rheumatologist) may be helpful. °TREATMENT  °Your caregiver will discuss with you treatment specific to your type of arthritis. If the specific type cannot be determined, then the following general recommendations may apply. °Treatment of severe joint pain includes: °· Rest. °· Elevation. °· Anti-inflammatory medication (for example, ibuprofen) may be prescribed. Avoiding activities that cause increased pain. °· Only take over-the-counter or prescription medicines for pain and discomfort as recommended by your caregiver. °· Cold packs over an inflamed joint may be used for 10 to 15 minutes every hour. Hot packs sometimes feel better, but do not use overnight. Do not use hot packs if you are diabetic without your caregiver's permission. °· A cortisone shot into arthritic joints may help reduce pain and swelling. °· Any acute arthritis that gets worse over the next 1 to 2 days needs to be looked at to be sure there is no joint infection. °Long-term arthritis treatment involves modifying activities and lifestyle to reduce joint stress jarring. This can include weight loss. Also, exercise is needed to nourish the joint cartilage and remove waste. This helps keep the muscles   around the joint strong. °HOME CARE INSTRUCTIONS  °· Do not take aspirin to relieve pain if gout is suspected. This elevates uric acid levels. °· Only take over-the-counter or prescription medicines for pain, discomfort or fever as directed by your caregiver. °· Rest the joint as much as  possible. °· If your joint is swollen, keep it elevated. °· Use crutches if the painful joint is in your leg. °· Drinking plenty of fluids may help for certain types of arthritis. °· Follow your caregiver's dietary instructions. °· Try low-impact exercise such as: °¨ Swimming. °¨ Water aerobics. °¨ Biking. °¨ Walking. °· Morning stiffness is often relieved by a warm shower. °· Put your joints through regular range-of-motion. °SEEK MEDICAL CARE IF:  °· You do not feel better in 24 hours or are getting worse. °· You have side effects to medications, or are not getting better with treatment. °SEEK IMMEDIATE MEDICAL CARE IF:  °· You have a fever. °· You develop severe joint pain, swelling or redness. °· Many joints are involved and become painful and swollen. °· There is severe back pain and/or leg weakness. °· You have loss of bowel or bladder control. °Document Released: 03/05/2004 Document Revised: 04/20/2011 Document Reviewed: 03/21/2008 °Exit[employer]® Patient Information ©2015 [employer]. This information is not intended to replace advice given to you by your health care provider. Make sure you discuss any questions you have with your health care provider. ° °Peripheral Neuropathy °Peripheral neuropathy is a type of nerve damage. It affects nerves that carry signals between the spinal cord and other parts of the body. These are called peripheral nerves. With peripheral neuropathy, one nerve or a group of nerves may be damaged.  °CAUSES  °Many things can damage peripheral nerves. For some people with peripheral neuropathy, the cause is unknown. Some causes include: °· Diabetes. This is the most common cause of peripheral neuropathy. °· Injury to a nerve. °· Pressure or stress on a nerve that lasts a long time. °· Too little vitamin B. Alcoholism can lead to this. °· Infections. °· Autoimmune diseases, such as multiple sclerosis and systemic lupus erythematosus. °· Inherited nerve diseases. °· Some medicines, such as  cancer drugs. °· Toxic substances, such as lead and mercury. °· Too little blood flowing to the legs. °· Kidney disease. °· Thyroid disease. °SIGNS AND SYMPTOMS  °Different people have different symptoms. The symptoms you have will depend on which of your nerves is damaged.  Common symptoms include: °· Loss of feeling (numbness) in the feet and hands. °· Tingling in the feet and hands. °· Pain that burns. °· Very sensitive skin. °· Weakness. °· Not being able to move a part of the body (paralysis). °· Muscle twitching. °· Clumsiness or poor coordination. °· Loss of balance. °· Not being able to control your bladder. °· Feeling dizzy. °· Sexual problems. °DIAGNOSIS  °Peripheral neuropathy is a symptom, not a disease. Finding the cause of peripheral neuropathy can be hard. To figure that out, your health care provider will take a medical history and do a physical exam. A neurological exam will also be done. This involves checking things affected by your brain, spinal cord, and nerves (nervous system). For example, your health care provider will check your reflexes, how you move, and what you can feel.  °Other types of tests may also be ordered, such as: °· Blood tests. °· A test of the fluid in your spinal cord. °· Imaging tests, such as CT scans or an MRI. °· Electromyography (EMG).   This test checks the nerves that control muscles. °· Nerve conduction velocity tests. These tests check how fast messages pass through your nerves. °· Nerve biopsy. A small piece of nerve is removed. It is then checked under a microscope. °TREATMENT  °· Medicine is often used to treat peripheral neuropathy. Medicines may include: °¨ Pain-relieving medicines. Prescription or over-the-counter medicine may be suggested. °¨ Antiseizure medicine. This may be used for pain. °¨ Antidepressants. These also may help ease pain from neuropathy. °¨ Lidocaine. This is a numbing medicine. You might wear a patch or be given a shot. °¨ Mexiletine. This  medicine is typically used to help control irregular heart rhythms. °· Surgery. Surgery may be needed to relieve pressure on a nerve or to destroy a nerve that is causing pain. °· Physical therapy to help movement. °· Assistive devices to help movement. °HOME CARE INSTRUCTIONS  °· Only take over-the-counter or prescription medicines as directed by your health care provider. Follow the instructions carefully for any given medicines. Do not take any other medicines without first getting approval from your health care provider. °· If you have diabetes, work closely with your health care provider to keep your blood sugar under control. °· If you have numbness in your feet: °¨ Check every day for signs of injury or infection. Watch for redness, warmth, and swelling. °¨ Wear padded socks and comfortable shoes. These help protect your feet. °· Do not do things that put pressure on your damaged nerve. °· Do not smoke. Smoking keeps blood from getting to damaged nerves. °· Avoid or limit alcohol. Too much alcohol can cause a lack of B vitamins. These vitamins are needed for healthy nerves. °· Develop a good support system. Coping with peripheral neuropathy can be stressful. Talk to a mental health specialist or join a support group if you are struggling. °· Follow up with your health care provider as directed. °SEEK MEDICAL CARE IF:  °· You have new signs or symptoms of peripheral neuropathy. °· You are struggling emotionally from dealing with peripheral neuropathy. °· You have a fever. °SEEK IMMEDIATE MEDICAL CARE IF:  °· You have an injury or infection that is not healing. °· You feel very dizzy or begin vomiting. °· You have chest pain. °· You have trouble breathing. °Document Released: 01/16/2002 Document Revised: 10/08/2010 Document Reviewed: 10/03/2012 °[employer]® Patient Information ©2015 [employer]. This information is not intended to replace advice given to you by your health care provider. Make sure you discuss  any questions you have with your health care provider. ° °

## 2014-01-01 NOTE — ED Provider Notes (Signed)
CSN: [id number]     Arrival date & time 01/01/14  1212 History   First MD Initiated Contact with Patient 01/01/14 1232     Chief Complaint  Patient presents with  . Weakness     (Consider location/radiation/quality/duration/timing/severity/associated sxs/prior Treatment) HPI Comments: Patient presents to the ER with numerous complaints. Patient reports that she has been experiencing generalized weakness. This might have started after she was given oxycodone for her chronic pain syndromes. She reports that her legs have been giving out to having trouble walking because of the weakness. She is complaining of pain in most of her joints. She has noticed some pain on the right side of her chest wall area which she has been using a Lidoderm patch for her. It has not helped. She does report that she has fallen recently, cannot remember when.  Patient also complaining of numbness and tingling in her hands and feet. This has been ongoing for some time.  Patient is a 80 y.o. female presenting with weakness.  Weakness    Past Medical History  Diagnosis Date  . Diabetes mellitus   . Hypertension   . Sarcoidosis   . Hypercholesteremia   . Degenerative disk disease   . Spinal stenosis    Past Surgical History  Procedure Laterality Date  . Eye surgery      cataract   No family history on file. History  Substance Use Topics  . Smoking status: Former Games developermoker  . Smokeless tobacco: Not on file  . Alcohol Use: No   OB History    No data available     Review of Systems  Unable to perform ROS: Other  Neurological: Positive for weakness and numbness.   Unable to perform meaningful review of systems because patient has a globally positive review of systems   Allergies  Review of patient's allergies indicates no known allergies.  Home Medications   Prior to Admission medications   Medication Sig Start Date End Date Taking? Authorizing Provider  aspirin 81 MG tablet Take 81 mg by  mouth daily.    Historical Provider, MD  bisacodyl (FLEET) 10 MG/30ML ENEM Place 10 mg rectally once.    Historical Provider, MD  clonazePAM (KLONOPIN) 0.5 MG tablet Take 0.5 mg by mouth 2 (two) times daily as needed. For anxiety    Historical Provider, MD  diclofenac sodium (VOLTAREN) 1 % GEL Apply 2 g topically as needed.    Historical Provider, MD  dicyclomine (BENTYL) 20 MG tablet Take 20 mg by mouth every 6 (six) hours.    Historical Provider, MD  glipiZIDE (GLUCOTROL) 10 MG tablet Take 10 mg by mouth 2 (two) times daily before a meal.    Historical Provider, MD  megestrol (MEGACE) 20 MG tablet Take 20 mg by mouth daily.    Historical Provider, MD  mirtazapine (REMERON) 30 MG tablet Take 30 mg by mouth at bedtime.    Historical Provider, MD  mometasone-formoterol (DULERA) 100-5 MCG/ACT AERO Inhale 2 puffs into the lungs 2 (two) times daily.    Historical Provider, MD  NIFEdipine (PROCARDIA-XL/ADALAT CC) 30 MG 24 hr tablet Take 30 mg by mouth daily.    Historical Provider, MD  omeprazole (PRILOSEC) 20 MG capsule Take 20 mg by mouth daily.    Historical Provider, MD  oxyCODONE-acetaminophen (PERCOCET) 5-325 MG per tablet Take 1 tablet by mouth every 6 (six) hours as needed for moderate pain. 12/01/13   Purvis SheffieldForrest Harrison, MD  predniSONE (DELTASONE) 5 MG tablet Take 5 mg  by mouth daily with breakfast.    Historical Provider, MD  simvastatin (ZOCOR) 40 MG tablet Take 40 mg by mouth every evening.    Historical Provider, MD  TRAMADOL HCL PO Take by mouth.    Historical Provider, MD   BP 137/57 mmHg  Pulse 80  Temp(Src) 99.2 F (37.3 C) (Oral)  Resp 20  Ht 5\' 4"  (1.626 m)  Wt 115 lb (52.164 kg)  BMI 19.73 kg/m2  SpO2 99% Physical Exam  Constitutional: She is oriented to person, place, and time. She appears well-developed and well-nourished. No distress.  HENT:  Head: Normocephalic and atraumatic.  Right Ear: Hearing normal.  Left Ear: Hearing normal.  Nose: Nose normal.  Mouth/Throat:  Oropharynx is clear and moist and mucous membranes are normal.  Eyes: Conjunctivae and EOM are normal. Pupils are equal, round, and reactive to light.  Neck: Normal range of motion. Neck supple.  Cardiovascular: Regular rhythm, S1 normal and S2 normal.  Exam reveals no gallop and no friction rub.   No murmur heard. Pulmonary/Chest: Effort normal and breath sounds normal. No respiratory distress. She exhibits tenderness. She exhibits no crepitus.    Abdominal: Soft. Normal appearance and bowel sounds are normal. There is no hepatosplenomegaly. There is no tenderness. There is no rebound, no guarding, no tenderness at McBurney's point and negative Murphy's sign. No hernia.  Musculoskeletal: Normal range of motion.  Neurological: She is alert and oriented to person, place, and time. She has normal strength. No cranial nerve deficit or sensory deficit. Coordination normal. GCS eye subscore is 4. GCS verbal subscore is 5. GCS motor subscore is 6.  Skin: Skin is warm, dry and intact. No rash noted. No cyanosis.  Psychiatric: She has a normal mood and affect. Her speech is normal and behavior is normal. Thought content normal.  Nursing note and vitals reviewed.   ED Course  Procedures (including critical care time) Labs Review Labs Reviewed  CBC WITH DIFFERENTIAL - Abnormal; Notable for the following:    RBC 3.69 (*)    Hemoglobin 11.3 (*)    HCT 34.4 (*)    Monocytes Relative 14 (*)    All other components within normal limits  COMPREHENSIVE METABOLIC PANEL - Abnormal; Notable for the following:    Glucose, Bld 150 (*)    Creatinine, Ser 1.20 (*)    GFR calc non Af Amer 39 (*)    GFR calc Af Amer 45 (*)    All other components within normal limits  PRO B NATRIURETIC PEPTIDE - Abnormal; Notable for the following:    Pro B Natriuretic peptide (BNP) 569.3 (*)    All other components within normal limits  TROPONIN I  URINALYSIS, ROUTINE W REFLEX MICROSCOPIC    Imaging Review Dg Ribs  Unilateral W/chest Right  01/01/2014   CLINICAL DATA:  Fall 2 months ago. Right sided rib and chest pain. Initial encounter. Sarcoidosis.  EXAM: RIGHT RIBS AND CHEST - 3+ VIEW  COMPARISON:  12/01/2013  FINDINGS: No fracture or other bone lesions are seen involving the ribs. There is no evidence of pneumothorax or pleural effusion.  Bilateral upper lobe scarring and bronchiectasis with superior hilar retraction appears stable. This is attributable to chronic sarcoidosis. No evidence of acute infiltrate. Heart size and mediastinal contours appear stable.  IMPRESSION: No acute right-sided rib fractures identified.  Stable changes of chronic sarcoidosis. No acute findings identified.   Electronically Signed   By: Myles RosenthalJohn  Stahl M.D.   On: 01/01/2014 14:11  EKG Interpretation   Date/Time:  Monday January 01 2014 13:30:51 EST Ventricular Rate:  85 PR Interval:  190 QRS Duration: 144 QT Interval:  406 QTC Calculation: 483 R Axis:   -79 Text Interpretation:  Normal sinus rhythm Left axis deviation Right bundle  branch block Septal infarct , age undetermined Abnormal ECG No significant  change since last tracing Confirmed by Rosemaria Inabinet  MD, Ronesha Heenan [phone]) on  01/01/2014 1:36:55 PM      MDM   Final diagnoses:  Rib pain on right side   Patient presents to the ER with numerous complaints. Patient reports that she has been having weakness in her legs and she was prescribed oxycodone for pain. Her doctor took her off the oxycodone and had her take tramadol 100 mg per dose for her pain, had previously been on 50 mg. This also made her feel shaky.  Since main complaints are multiple areas of arthralgia, likely secondary to arthritis. Joints are not swollen, no effusions. She is not having any obvious trauma on examination.  Patient is complaining of pain on the right side of her chest wall area. She has significant tenderness over the lateral ribs, no crepitance. X-ray of chest and ribs was  negative. History of Sarcoidosis, takes prednisone daily. I do not feel that she requires an increased dose of her prednisone.. She does not have any evidence of pneumonia, no significant bronchospasm. Oxygenation 99%.  Patient complains of numbness and tingling in her hands and feet. She does have a history of diabetes. This is likely secondary to peripheral neuropathy. Can try low-dose Neurontin. She is to follow-up with primary doctor next week.    Gilda Crease, MD 01/03/14 [phone]

## 2014-01-01 NOTE — ED Notes (Signed)
Weakness for 3 weeks. Legs have been giving out since she was given a Rx for Oxycodone at [facility]. She is aching all over. She was here on Friday but left without being seen due to transportation had to go to work.

## 2014-02-03 DIAGNOSIS — K219 Gastro-esophageal reflux disease without esophagitis: Secondary | ICD-10-CM | POA: Diagnosis not present

## 2014-02-03 DIAGNOSIS — E119 Type 2 diabetes mellitus without complications: Secondary | ICD-10-CM | POA: Diagnosis not present

## 2014-02-03 DIAGNOSIS — D860 Sarcoidosis of lung: Secondary | ICD-10-CM | POA: Diagnosis not present

## 2014-02-03 DIAGNOSIS — I509 Heart failure, unspecified: Secondary | ICD-10-CM | POA: Diagnosis not present

## 2014-02-03 DIAGNOSIS — I10 Essential (primary) hypertension: Secondary | ICD-10-CM | POA: Diagnosis not present

## 2014-02-06 DIAGNOSIS — I10 Essential (primary) hypertension: Secondary | ICD-10-CM | POA: Diagnosis not present

## 2014-02-06 DIAGNOSIS — K219 Gastro-esophageal reflux disease without esophagitis: Secondary | ICD-10-CM | POA: Diagnosis not present

## 2014-02-06 DIAGNOSIS — I509 Heart failure, unspecified: Secondary | ICD-10-CM | POA: Diagnosis not present

## 2014-02-06 DIAGNOSIS — E119 Type 2 diabetes mellitus without complications: Secondary | ICD-10-CM | POA: Diagnosis not present

## 2014-02-06 DIAGNOSIS — D860 Sarcoidosis of lung: Secondary | ICD-10-CM | POA: Diagnosis not present

## 2014-02-08 DIAGNOSIS — D860 Sarcoidosis of lung: Secondary | ICD-10-CM | POA: Diagnosis not present

## 2014-02-08 DIAGNOSIS — I509 Heart failure, unspecified: Secondary | ICD-10-CM | POA: Diagnosis not present

## 2014-02-08 DIAGNOSIS — K219 Gastro-esophageal reflux disease without esophagitis: Secondary | ICD-10-CM | POA: Diagnosis not present

## 2014-02-08 DIAGNOSIS — E119 Type 2 diabetes mellitus without complications: Secondary | ICD-10-CM | POA: Diagnosis not present

## 2014-02-08 DIAGNOSIS — I10 Essential (primary) hypertension: Secondary | ICD-10-CM | POA: Diagnosis not present

## 2014-02-09 DIAGNOSIS — I509 Heart failure, unspecified: Secondary | ICD-10-CM | POA: Diagnosis not present

## 2014-02-09 DIAGNOSIS — I10 Essential (primary) hypertension: Secondary | ICD-10-CM | POA: Diagnosis not present

## 2014-02-09 DIAGNOSIS — D860 Sarcoidosis of lung: Secondary | ICD-10-CM | POA: Diagnosis not present

## 2014-02-09 DIAGNOSIS — K219 Gastro-esophageal reflux disease without esophagitis: Secondary | ICD-10-CM | POA: Diagnosis not present

## 2014-02-09 DIAGNOSIS — E119 Type 2 diabetes mellitus without complications: Secondary | ICD-10-CM | POA: Diagnosis not present

## 2014-02-13 DIAGNOSIS — I10 Essential (primary) hypertension: Secondary | ICD-10-CM | POA: Diagnosis not present

## 2014-02-13 DIAGNOSIS — D860 Sarcoidosis of lung: Secondary | ICD-10-CM | POA: Diagnosis not present

## 2014-02-13 DIAGNOSIS — K219 Gastro-esophageal reflux disease without esophagitis: Secondary | ICD-10-CM | POA: Diagnosis not present

## 2014-02-13 DIAGNOSIS — I509 Heart failure, unspecified: Secondary | ICD-10-CM | POA: Diagnosis not present

## 2014-02-13 DIAGNOSIS — E119 Type 2 diabetes mellitus without complications: Secondary | ICD-10-CM | POA: Diagnosis not present

## 2014-02-14 DIAGNOSIS — I509 Heart failure, unspecified: Secondary | ICD-10-CM | POA: Diagnosis not present

## 2014-02-14 DIAGNOSIS — R5382 Chronic fatigue, unspecified: Secondary | ICD-10-CM | POA: Diagnosis not present

## 2014-02-14 DIAGNOSIS — I951 Orthostatic hypotension: Secondary | ICD-10-CM | POA: Diagnosis not present

## 2014-02-14 DIAGNOSIS — Z09 Encounter for follow-up examination after completed treatment for conditions other than malignant neoplasm: Secondary | ICD-10-CM | POA: Diagnosis not present

## 2014-02-15 DIAGNOSIS — E119 Type 2 diabetes mellitus without complications: Secondary | ICD-10-CM | POA: Diagnosis not present

## 2014-02-15 DIAGNOSIS — D860 Sarcoidosis of lung: Secondary | ICD-10-CM | POA: Diagnosis not present

## 2014-02-15 DIAGNOSIS — I509 Heart failure, unspecified: Secondary | ICD-10-CM | POA: Diagnosis not present

## 2014-02-15 DIAGNOSIS — K219 Gastro-esophageal reflux disease without esophagitis: Secondary | ICD-10-CM | POA: Diagnosis not present

## 2014-02-15 DIAGNOSIS — I10 Essential (primary) hypertension: Secondary | ICD-10-CM | POA: Diagnosis not present

## 2014-02-19 DIAGNOSIS — K219 Gastro-esophageal reflux disease without esophagitis: Secondary | ICD-10-CM | POA: Diagnosis not present

## 2014-02-19 DIAGNOSIS — E119 Type 2 diabetes mellitus without complications: Secondary | ICD-10-CM | POA: Diagnosis not present

## 2014-02-19 DIAGNOSIS — D860 Sarcoidosis of lung: Secondary | ICD-10-CM | POA: Diagnosis not present

## 2014-02-19 DIAGNOSIS — I509 Heart failure, unspecified: Secondary | ICD-10-CM | POA: Diagnosis not present

## 2014-02-19 DIAGNOSIS — I10 Essential (primary) hypertension: Secondary | ICD-10-CM | POA: Diagnosis not present

## 2014-02-20 DIAGNOSIS — I10 Essential (primary) hypertension: Secondary | ICD-10-CM | POA: Diagnosis not present

## 2014-02-20 DIAGNOSIS — E119 Type 2 diabetes mellitus without complications: Secondary | ICD-10-CM | POA: Diagnosis not present

## 2014-02-20 DIAGNOSIS — D860 Sarcoidosis of lung: Secondary | ICD-10-CM | POA: Diagnosis not present

## 2014-02-20 DIAGNOSIS — K219 Gastro-esophageal reflux disease without esophagitis: Secondary | ICD-10-CM | POA: Diagnosis not present

## 2014-02-20 DIAGNOSIS — I509 Heart failure, unspecified: Secondary | ICD-10-CM | POA: Diagnosis not present

## 2014-02-21 DIAGNOSIS — D860 Sarcoidosis of lung: Secondary | ICD-10-CM | POA: Diagnosis not present

## 2014-02-21 DIAGNOSIS — E119 Type 2 diabetes mellitus without complications: Secondary | ICD-10-CM | POA: Diagnosis not present

## 2014-02-21 DIAGNOSIS — I10 Essential (primary) hypertension: Secondary | ICD-10-CM | POA: Diagnosis not present

## 2014-02-21 DIAGNOSIS — K219 Gastro-esophageal reflux disease without esophagitis: Secondary | ICD-10-CM | POA: Diagnosis not present

## 2014-02-21 DIAGNOSIS — I509 Heart failure, unspecified: Secondary | ICD-10-CM | POA: Diagnosis not present

## 2014-02-23 DIAGNOSIS — E119 Type 2 diabetes mellitus without complications: Secondary | ICD-10-CM | POA: Diagnosis not present

## 2014-02-23 DIAGNOSIS — I10 Essential (primary) hypertension: Secondary | ICD-10-CM | POA: Diagnosis not present

## 2014-02-23 DIAGNOSIS — K219 Gastro-esophageal reflux disease without esophagitis: Secondary | ICD-10-CM | POA: Diagnosis not present

## 2014-02-23 DIAGNOSIS — I509 Heart failure, unspecified: Secondary | ICD-10-CM | POA: Diagnosis not present

## 2014-02-23 DIAGNOSIS — D860 Sarcoidosis of lung: Secondary | ICD-10-CM | POA: Diagnosis not present

## 2014-02-28 DIAGNOSIS — I10 Essential (primary) hypertension: Secondary | ICD-10-CM | POA: Diagnosis not present

## 2014-02-28 DIAGNOSIS — K219 Gastro-esophageal reflux disease without esophagitis: Secondary | ICD-10-CM | POA: Diagnosis not present

## 2014-02-28 DIAGNOSIS — E119 Type 2 diabetes mellitus without complications: Secondary | ICD-10-CM | POA: Diagnosis not present

## 2014-02-28 DIAGNOSIS — I509 Heart failure, unspecified: Secondary | ICD-10-CM | POA: Diagnosis not present

## 2014-02-28 DIAGNOSIS — D860 Sarcoidosis of lung: Secondary | ICD-10-CM | POA: Diagnosis not present

## 2014-03-01 DIAGNOSIS — D860 Sarcoidosis of lung: Secondary | ICD-10-CM | POA: Diagnosis not present

## 2014-03-01 DIAGNOSIS — E119 Type 2 diabetes mellitus without complications: Secondary | ICD-10-CM | POA: Diagnosis not present

## 2014-03-01 DIAGNOSIS — K219 Gastro-esophageal reflux disease without esophagitis: Secondary | ICD-10-CM | POA: Diagnosis not present

## 2014-03-01 DIAGNOSIS — I10 Essential (primary) hypertension: Secondary | ICD-10-CM | POA: Diagnosis not present

## 2014-03-01 DIAGNOSIS — I509 Heart failure, unspecified: Secondary | ICD-10-CM | POA: Diagnosis not present

## 2014-03-06 DIAGNOSIS — E119 Type 2 diabetes mellitus without complications: Secondary | ICD-10-CM | POA: Diagnosis not present

## 2014-03-06 DIAGNOSIS — K219 Gastro-esophageal reflux disease without esophagitis: Secondary | ICD-10-CM | POA: Diagnosis not present

## 2014-03-06 DIAGNOSIS — I509 Heart failure, unspecified: Secondary | ICD-10-CM | POA: Diagnosis not present

## 2014-03-06 DIAGNOSIS — I10 Essential (primary) hypertension: Secondary | ICD-10-CM | POA: Diagnosis not present

## 2014-03-06 DIAGNOSIS — D860 Sarcoidosis of lung: Secondary | ICD-10-CM | POA: Diagnosis not present

## 2014-03-07 DIAGNOSIS — D860 Sarcoidosis of lung: Secondary | ICD-10-CM | POA: Diagnosis not present

## 2014-03-07 DIAGNOSIS — I509 Heart failure, unspecified: Secondary | ICD-10-CM | POA: Diagnosis not present

## 2014-03-07 DIAGNOSIS — I10 Essential (primary) hypertension: Secondary | ICD-10-CM | POA: Diagnosis not present

## 2014-03-07 DIAGNOSIS — K219 Gastro-esophageal reflux disease without esophagitis: Secondary | ICD-10-CM | POA: Diagnosis not present

## 2014-03-07 DIAGNOSIS — E119 Type 2 diabetes mellitus without complications: Secondary | ICD-10-CM | POA: Diagnosis not present

## 2014-03-08 DIAGNOSIS — E1165 Type 2 diabetes mellitus with hyperglycemia: Secondary | ICD-10-CM | POA: Diagnosis not present

## 2014-03-08 DIAGNOSIS — E559 Vitamin D deficiency, unspecified: Secondary | ICD-10-CM | POA: Diagnosis not present

## 2014-03-08 DIAGNOSIS — I10 Essential (primary) hypertension: Secondary | ICD-10-CM | POA: Diagnosis not present

## 2014-03-08 DIAGNOSIS — E1129 Type 2 diabetes mellitus with other diabetic kidney complication: Secondary | ICD-10-CM | POA: Diagnosis not present

## 2014-03-08 DIAGNOSIS — R200 Anesthesia of skin: Secondary | ICD-10-CM | POA: Diagnosis not present

## 2014-03-08 DIAGNOSIS — E780 Pure hypercholesterolemia: Secondary | ICD-10-CM | POA: Diagnosis not present

## 2014-03-10 DIAGNOSIS — E119 Type 2 diabetes mellitus without complications: Secondary | ICD-10-CM | POA: Diagnosis not present

## 2014-03-10 DIAGNOSIS — K219 Gastro-esophageal reflux disease without esophagitis: Secondary | ICD-10-CM | POA: Diagnosis not present

## 2014-03-10 DIAGNOSIS — I10 Essential (primary) hypertension: Secondary | ICD-10-CM | POA: Diagnosis not present

## 2014-03-10 DIAGNOSIS — D860 Sarcoidosis of lung: Secondary | ICD-10-CM | POA: Diagnosis not present

## 2014-03-10 DIAGNOSIS — I509 Heart failure, unspecified: Secondary | ICD-10-CM | POA: Diagnosis not present

## 2014-03-12 DIAGNOSIS — M5137 Other intervertebral disc degeneration, lumbosacral region: Secondary | ICD-10-CM | POA: Diagnosis not present

## 2014-03-12 DIAGNOSIS — M19012 Primary osteoarthritis, left shoulder: Secondary | ICD-10-CM | POA: Diagnosis not present

## 2014-03-12 DIAGNOSIS — I10 Essential (primary) hypertension: Secondary | ICD-10-CM | POA: Diagnosis not present

## 2014-03-12 DIAGNOSIS — M25512 Pain in left shoulder: Secondary | ICD-10-CM | POA: Diagnosis not present

## 2014-03-12 DIAGNOSIS — M47817 Spondylosis without myelopathy or radiculopathy, lumbosacral region: Secondary | ICD-10-CM | POA: Diagnosis not present

## 2014-03-13 DIAGNOSIS — I509 Heart failure, unspecified: Secondary | ICD-10-CM | POA: Diagnosis not present

## 2014-03-13 DIAGNOSIS — I10 Essential (primary) hypertension: Secondary | ICD-10-CM | POA: Diagnosis not present

## 2014-03-13 DIAGNOSIS — E119 Type 2 diabetes mellitus without complications: Secondary | ICD-10-CM | POA: Diagnosis not present

## 2014-03-13 DIAGNOSIS — D860 Sarcoidosis of lung: Secondary | ICD-10-CM | POA: Diagnosis not present

## 2014-03-13 DIAGNOSIS — K219 Gastro-esophageal reflux disease without esophagitis: Secondary | ICD-10-CM | POA: Diagnosis not present

## 2014-03-15 DIAGNOSIS — E119 Type 2 diabetes mellitus without complications: Secondary | ICD-10-CM | POA: Diagnosis not present

## 2014-03-15 DIAGNOSIS — I509 Heart failure, unspecified: Secondary | ICD-10-CM | POA: Diagnosis not present

## 2014-03-15 DIAGNOSIS — I10 Essential (primary) hypertension: Secondary | ICD-10-CM | POA: Diagnosis not present

## 2014-03-15 DIAGNOSIS — K219 Gastro-esophageal reflux disease without esophagitis: Secondary | ICD-10-CM | POA: Diagnosis not present

## 2014-03-15 DIAGNOSIS — D860 Sarcoidosis of lung: Secondary | ICD-10-CM | POA: Diagnosis not present

## 2014-03-16 DIAGNOSIS — I10 Essential (primary) hypertension: Secondary | ICD-10-CM | POA: Diagnosis not present

## 2014-03-16 DIAGNOSIS — K219 Gastro-esophageal reflux disease without esophagitis: Secondary | ICD-10-CM | POA: Diagnosis not present

## 2014-03-16 DIAGNOSIS — D860 Sarcoidosis of lung: Secondary | ICD-10-CM | POA: Diagnosis not present

## 2014-03-16 DIAGNOSIS — I509 Heart failure, unspecified: Secondary | ICD-10-CM | POA: Diagnosis not present

## 2014-03-16 DIAGNOSIS — E119 Type 2 diabetes mellitus without complications: Secondary | ICD-10-CM | POA: Diagnosis not present

## 2014-03-20 DIAGNOSIS — I509 Heart failure, unspecified: Secondary | ICD-10-CM | POA: Diagnosis not present

## 2014-03-20 DIAGNOSIS — D860 Sarcoidosis of lung: Secondary | ICD-10-CM | POA: Diagnosis not present

## 2014-03-20 DIAGNOSIS — I10 Essential (primary) hypertension: Secondary | ICD-10-CM | POA: Diagnosis not present

## 2014-03-20 DIAGNOSIS — E119 Type 2 diabetes mellitus without complications: Secondary | ICD-10-CM | POA: Diagnosis not present

## 2014-03-20 DIAGNOSIS — K219 Gastro-esophageal reflux disease without esophagitis: Secondary | ICD-10-CM | POA: Diagnosis not present

## 2014-03-22 DIAGNOSIS — I10 Essential (primary) hypertension: Secondary | ICD-10-CM | POA: Diagnosis not present

## 2014-03-22 DIAGNOSIS — E119 Type 2 diabetes mellitus without complications: Secondary | ICD-10-CM | POA: Diagnosis not present

## 2014-03-22 DIAGNOSIS — K219 Gastro-esophageal reflux disease without esophagitis: Secondary | ICD-10-CM | POA: Diagnosis not present

## 2014-03-22 DIAGNOSIS — I509 Heart failure, unspecified: Secondary | ICD-10-CM | POA: Diagnosis not present

## 2014-03-22 DIAGNOSIS — D860 Sarcoidosis of lung: Secondary | ICD-10-CM | POA: Diagnosis not present

## 2014-03-28 DIAGNOSIS — K219 Gastro-esophageal reflux disease without esophagitis: Secondary | ICD-10-CM | POA: Diagnosis not present

## 2014-03-28 DIAGNOSIS — I10 Essential (primary) hypertension: Secondary | ICD-10-CM | POA: Diagnosis not present

## 2014-03-28 DIAGNOSIS — I509 Heart failure, unspecified: Secondary | ICD-10-CM | POA: Diagnosis not present

## 2014-03-28 DIAGNOSIS — E119 Type 2 diabetes mellitus without complications: Secondary | ICD-10-CM | POA: Diagnosis not present

## 2014-03-28 DIAGNOSIS — D860 Sarcoidosis of lung: Secondary | ICD-10-CM | POA: Diagnosis not present

## 2014-04-11 DIAGNOSIS — M5137 Other intervertebral disc degeneration, lumbosacral region: Secondary | ICD-10-CM | POA: Diagnosis not present

## 2014-04-11 DIAGNOSIS — G629 Polyneuropathy, unspecified: Secondary | ICD-10-CM | POA: Diagnosis not present

## 2014-04-17 DIAGNOSIS — H524 Presbyopia: Secondary | ICD-10-CM | POA: Diagnosis not present

## 2014-04-17 DIAGNOSIS — E119 Type 2 diabetes mellitus without complications: Secondary | ICD-10-CM | POA: Diagnosis not present

## 2014-04-17 DIAGNOSIS — Z961 Presence of intraocular lens: Secondary | ICD-10-CM | POA: Diagnosis not present

## 2014-04-17 DIAGNOSIS — H2512 Age-related nuclear cataract, left eye: Secondary | ICD-10-CM | POA: Diagnosis not present

## 2014-04-17 DIAGNOSIS — H35033 Hypertensive retinopathy, bilateral: Secondary | ICD-10-CM | POA: Diagnosis not present

## 2014-04-25 DIAGNOSIS — G63 Polyneuropathy in diseases classified elsewhere: Secondary | ICD-10-CM | POA: Diagnosis not present

## 2014-04-25 DIAGNOSIS — M19012 Primary osteoarthritis, left shoulder: Secondary | ICD-10-CM | POA: Diagnosis not present

## 2014-04-25 DIAGNOSIS — M4806 Spinal stenosis, lumbar region: Secondary | ICD-10-CM | POA: Diagnosis not present

## 2014-05-07 DIAGNOSIS — R0602 Shortness of breath: Secondary | ICD-10-CM | POA: Diagnosis not present

## 2014-05-07 DIAGNOSIS — D869 Sarcoidosis, unspecified: Secondary | ICD-10-CM | POA: Diagnosis not present

## 2014-05-08 DIAGNOSIS — H2512 Age-related nuclear cataract, left eye: Secondary | ICD-10-CM | POA: Diagnosis not present

## 2014-05-17 ENCOUNTER — Emergency Department (HOSPITAL_BASED_OUTPATIENT_CLINIC_OR_DEPARTMENT_OTHER)
Admission: EM | Admit: 2014-05-17 | Discharge: 2014-05-17 | Disposition: A | Discharge status: Home / Self Care | Payer: Medicare Other | Attending: Emergency Medicine | Admitting: Emergency Medicine

## 2014-05-17 ENCOUNTER — Encounter (HOSPITAL_BASED_OUTPATIENT_CLINIC_OR_DEPARTMENT_OTHER): Payer: Self-pay | Admitting: *Deleted

## 2014-05-17 ENCOUNTER — Emergency Department (HOSPITAL_BASED_OUTPATIENT_CLINIC_OR_DEPARTMENT_OTHER): Payer: Medicare Other

## 2014-05-17 DIAGNOSIS — Z79899 Other long term (current) drug therapy: Secondary | ICD-10-CM | POA: Diagnosis not present

## 2014-05-17 DIAGNOSIS — E780 Pure hypercholesterolemia: Secondary | ICD-10-CM | POA: Diagnosis not present

## 2014-05-17 DIAGNOSIS — Z7982 Long term (current) use of aspirin: Secondary | ICD-10-CM | POA: Insufficient documentation

## 2014-05-17 DIAGNOSIS — Z8739 Personal history of other diseases of the musculoskeletal system and connective tissue: Secondary | ICD-10-CM | POA: Diagnosis not present

## 2014-05-17 DIAGNOSIS — Z87891 Personal history of nicotine dependence: Secondary | ICD-10-CM | POA: Diagnosis not present

## 2014-05-17 DIAGNOSIS — I10 Essential (primary) hypertension: Secondary | ICD-10-CM | POA: Insufficient documentation

## 2014-05-17 DIAGNOSIS — K2970 Gastritis, unspecified, without bleeding: Secondary | ICD-10-CM | POA: Insufficient documentation

## 2014-05-17 DIAGNOSIS — R1013 Epigastric pain: Secondary | ICD-10-CM | POA: Diagnosis not present

## 2014-05-17 DIAGNOSIS — E119 Type 2 diabetes mellitus without complications: Secondary | ICD-10-CM | POA: Insufficient documentation

## 2014-05-17 DIAGNOSIS — R109 Unspecified abdominal pain: Secondary | ICD-10-CM

## 2014-05-17 DIAGNOSIS — Z862 Personal history of diseases of the blood and blood-forming organs and certain disorders involving the immune mechanism: Secondary | ICD-10-CM | POA: Insufficient documentation

## 2014-05-17 LAB — CBC WITH DIFFERENTIAL/PLATELET
BAND NEUTROPHILS: 0 % (ref 0–10)
BASOS PCT: 0 % (ref 0–1)
Basophils Absolute: 0 10*3/uL (ref 0.0–0.1)
Blasts: 0 %
EOS ABS: 0.1 10*3/uL (ref 0.0–0.7)
EOS PCT: 1 % (ref 0–5)
HCT: 37.5 % (ref 36.0–46.0)
Hemoglobin: 12.5 g/dL (ref 12.0–15.0)
Lymphocytes Relative: 23 % (ref 12–46)
Lymphs Abs: 1.4 10*3/uL (ref 0.7–4.0)
MCH: 32.2 pg (ref 26.0–34.0)
MCHC: 33.3 g/dL (ref 30.0–36.0)
MCV: 96.6 fL (ref 78.0–100.0)
METAMYELOCYTES PCT: 0 %
MONO ABS: 0.4 10*3/uL (ref 0.1–1.0)
Monocytes Relative: 7 % (ref 3–12)
Myelocytes: 0 %
Neutro Abs: 4.3 10*3/uL (ref 1.7–7.7)
Neutrophils Relative %: 69 % (ref 43–77)
Platelets: 167 10*3/uL (ref 150–400)
Promyelocytes Absolute: 0 %
RBC: 3.88 MIL/uL (ref 3.87–5.11)
RDW: 13.5 % (ref 11.5–15.5)
WBC: 6.2 10*3/uL (ref 4.0–10.5)
nRBC: 0 /100 WBC

## 2014-05-17 LAB — TROPONIN I

## 2014-05-17 LAB — COMPREHENSIVE METABOLIC PANEL
ALK PHOS: 61 U/L (ref 39–117)
ALT: 14 U/L (ref 0–35)
AST: 21 U/L (ref 0–37)
Albumin: 4.2 g/dL (ref 3.5–5.2)
Anion gap: 8 (ref 5–15)
BILIRUBIN TOTAL: 0.3 mg/dL (ref 0.3–1.2)
BUN: 19 mg/dL (ref 6–23)
CO2: 29 mmol/L (ref 19–32)
Calcium: 9.9 mg/dL (ref 8.4–10.5)
Chloride: 105 mmol/L (ref 96–112)
Creatinine, Ser: 1.15 mg/dL — ABNORMAL HIGH (ref 0.50–1.10)
GFR calc Af Amer: 48 mL/min — ABNORMAL LOW (ref >90)
GFR calc non Af Amer: 41 mL/min — ABNORMAL LOW (ref >90)
Glucose, Bld: 86 mg/dL (ref 70–99)
Potassium: 4 mmol/L (ref 3.5–5.1)
SODIUM: 142 mmol/L (ref 135–145)
TOTAL PROTEIN: 6.9 g/dL (ref 6.0–8.3)

## 2014-05-17 LAB — LIPASE, BLOOD: Lipase: 53 U/L (ref 11–59)

## 2014-05-17 LAB — I-STAT CG4 LACTIC ACID, ED: LACTIC ACID, VENOUS: 1.24 mmol/L (ref 0.5–2.0)

## 2014-05-17 MED ORDER — GI COCKTAIL ~~LOC~~
30.0000 mL | Freq: Once | ORAL | Status: AC
  Administered 2014-05-17: 30 mL via ORAL
  Filled 2014-05-17: qty 30

## 2014-05-17 MED ORDER — SUCRALFATE 1 G PO TABS: 1.0000 g | ORAL_TABLET | Freq: Three times a day (TID) | ORAL | Status: DC

## 2014-05-17 NOTE — ED Notes (Signed)
Abdominal pain and diarrhea for a week.

## 2014-05-17 NOTE — ED Provider Notes (Addendum)
CSN: [id number]     Arrival date & time 05/17/14  1108 History   First MD Initiated Contact with Patient 05/17/14 1153     Chief Complaint  Patient presents with  . Abdominal Pain     (Consider location/radiation/quality/duration/timing/severity/associated sxs/prior Treatment) Patient is a 81 y.o. female presenting with abdominal pain. The history is provided by the patient.  Abdominal Pain Pain location:  Epigastric Pain quality: aching, bloating and cramping   Pain radiates to:  Does not radiate Pain severity:  Moderate Onset quality:  Gradual Duration:  4 days Timing:  Constant Progression:  Worsening Chronicity:  New Context: eating   Relieved by:  Nothing Worsened by:  Nothing tried Ineffective treatments:  None tried Associated symptoms: belching and chest pain   Associated symptoms: no anorexia, no chills, no cough, no fever, no nausea, no shortness of breath and no vomiting   Associated symptoms comment:  Loose stool 3 times a day for the last 4 days Risk factors: aspirin and being elderly   Risk factors: no alcohol abuse, no NSAID use and no recent hospitalization   Risk factors comment:  Hx of IBS and has had pain like this in the past.     Past Medical History  Diagnosis Date  . Diabetes mellitus   . Hypertension   . Sarcoidosis   . Hypercholesteremia   . Degenerative disk disease   . Spinal stenosis    Past Surgical History  Procedure Laterality Date  . Eye surgery      cataract   No family history on file. History  Substance Use Topics  . Smoking status: Former Games developermoker  . Smokeless tobacco: Not on file  . Alcohol Use: No   OB History    No data available     Review of Systems  Constitutional: Negative for fever, chills and appetite change.  Respiratory: Negative for cough and shortness of breath.   Cardiovascular: Positive for chest pain.  Gastrointestinal: Positive for abdominal pain. Negative for nausea, vomiting and anorexia.  All other  systems reviewed and are negative.     Allergies  Dicyclomine; Gabapentin; and Metformin and related  Home Medications   Prior to Admission medications   Medication Sig Start Date End Date Taking? Authorizing Provider  DULoxetine HCl (CYMBALTA PO) Take by mouth.   Yes Historical Provider, MD  aspirin 81 MG tablet Take 81 mg by mouth daily.    Historical Provider, MD  bisacodyl (FLEET) 10 MG/30ML ENEM Place 10 mg rectally once.    Historical Provider, MD  clonazePAM (KLONOPIN) 0.5 MG tablet Take 0.5 mg by mouth 2 (two) times daily as needed. For anxiety    Historical Provider, MD  diclofenac sodium (VOLTAREN) 1 % GEL Apply 2 g topically as needed.    Historical Provider, MD  dicyclomine (BENTYL) 20 MG tablet Take 20 mg by mouth every 6 (six) hours.    Historical Provider, MD  glipiZIDE (GLUCOTROL) 10 MG tablet Take 10 mg by mouth 2 (two) times daily before a meal.    Historical Provider, MD  megestrol (MEGACE) 20 MG tablet Take 20 mg by mouth daily.    Historical Provider, MD  mirtazapine (REMERON) 30 MG tablet Take 30 mg by mouth at bedtime.    Historical Provider, MD  mometasone-formoterol (DULERA) 100-5 MCG/ACT AERO Inhale 2 puffs into the lungs 2 (two) times daily.    Historical Provider, MD  NIFEdipine (PROCARDIA-XL/ADALAT CC) 30 MG 24 hr tablet Take 30 mg by mouth daily.  Historical Provider, MD  omeprazole (PRILOSEC) 20 MG capsule Take 20 mg by mouth daily.    Historical Provider, MD  predniSONE (DELTASONE) 5 MG tablet Take 5 mg by mouth daily with breakfast.    Historical Provider, MD  simvastatin (ZOCOR) 40 MG tablet Take 40 mg by mouth every evening.    Historical Provider, MD  TRAMADOL HCL PO Take by mouth.    Historical Provider, MD   BP 126/67 mmHg  Pulse 95  Temp(Src) 98.3 F (36.8 C) (Oral)  Resp 20  Ht  (1.626 m)  Wt 138 lb (62.596 kg)  BMI 23.68 kg/m2  SpO2 99% Physical Exam  Constitutional: She is oriented to person, place, and time. She appears  well-developed and well-nourished. No distress.  HENT:  Head: Normocephalic and atraumatic.  Mouth/Throat: Oropharynx is clear and moist.  Eyes: Conjunctivae and EOM are normal. Pupils are equal, round, and reactive to light.  Neck: Normal range of motion. Neck supple.  Cardiovascular: Normal rate, regular rhythm and intact distal pulses.   No murmur heard. Pulmonary/Chest: Effort normal and breath sounds normal. No respiratory distress. She has no wheezes. She has no rales.  Abdominal: Soft. She exhibits no distension. There is tenderness in the epigastric area. There is no rebound and no guarding.  Musculoskeletal: Normal range of motion. She exhibits edema. She exhibits no tenderness.  2+ pitting edema in bilateral lower ext  Neurological: She is alert and oriented to person, place, and time.  Skin: Skin is warm and dry. No rash noted. No erythema.  Psychiatric: She has a normal mood and affect. Her behavior is normal.  Nursing note and vitals reviewed.   ED Course  Procedures (including critical care time) Labs Review Labs Reviewed  COMPREHENSIVE METABOLIC PANEL - Abnormal; Notable for the following:    Creatinine, Ser 1.15 (*)    GFR calc non Af Amer 41 (*)    GFR calc Af Amer 48 (*)    All other components within normal limits  CBC WITH DIFFERENTIAL/PLATELET  TROPONIN I  LIPASE, BLOOD  I-STAT CG4 LACTIC ACID, ED    Imaging Review Dg Abd Acute W/chest  05/17/2014   CLINICAL DATA:  One week history abdominal pain  EXAM: DG ABDOMEN ACUTE W/ 1V CHEST  COMPARISON:  Chest radiograph January 29, 2014; CT abdomen and pelvis December 01, 2013  FINDINGS: PA chest: There is fibrotic change in the upper lobes/apices bilaterally, stable. Lungs elsewhere clear. The heart size is normal. Pulmonary vascularity is distorted due to the scarring and fibrosis in the upper lobes. The pulmonary vascular appear stable.+ No adenopathy.  Supine and upright abdomen: There is diffuse stool throughout  the colon. There is no bowel dilatation or air-fluid level suggesting obstruction. No free air. There are scattered foci of vascular calcification. There is arthropathy in each hip joint.  IMPRESSION: Diffuse stool throughout colon. No bowel obstruction or free air. Stable apical fibrotic type change with volume loss. No lung edema or consolidation.   Electronically Signed   By: Bretta Bang III M.D.   On: 05/17/2014 13:11     EKG Interpretation   Date/Time:  Thursday May 17 2014 11:35:33 EDT Ventricular Rate:  96 PR Interval:  198 QRS Duration: 138 QT Interval:  394 QTC Calculation: 497 R Axis:   -71 Text Interpretation:  Sinus rhythm with Premature atrial complexes with  Abberant conduction Right bundle branch block Left anterior fascicular  block Moderate voltage criteria for LVH, may be normal variant No  significant change since last tracing Confirmed by [facility]  MD, Alphonzo Lemmings  [phone]) on 05/17/2014 11:54:49 AM      MDM   Final diagnoses:  Abdominal pain  Gastritis    Patient presenting with a history of epigastric tenderness that started 3-4 days ago as well as loose stool that started at the same time. She is having 3 loose stools a day which she states is abnormal. She denies any nausea or vomiting. Patient also said with this epigastric pain she has a lower chest pain and tightness without shortness of breath, nausea or vomiting. Based on patient's report the lower chest pain is chronic which she was taking tramadol for but they took her off approximately 2 months ago and started on Cymbalta which has not improved her symptoms. Patient has a history of IBS as well as diabetes and hypertension. She denies any appetite changes, recent antibiotics or other changes in her medication. She does note she took a 14 day course of Lasix which finished on Monday for her lower extremity edema. Currently she states her swelling is improved from baseline.  She has chronic shortness of  breath but states that is no different than prior also denying URI symptoms or fever.  On exam patient has epigastric tenderness but no findings concerning for diverticulitis, appendicitis, cholecystitis or pancreatitis. EKG shows right bundle-branch block without acute changes. Lower suspicion for cardiac etiology however troponin will be sent to rule out ACS. Feel this could be gastritis related versus a flare in her IBS. At this time patient is displaying no symptoms warranting a CT scan of her abdomen. She denies any urinary symptoms.  CBC, CMP, lipase, troponin pending.  Acute abdominal series without acute findings.  1:40 PM All labs wnl.  After GI cocktail pt states her pain is gone.  Discussed increasing her omeprazole to bid for 2 weeks and will add carafate.  Will have pt f/u with PCP and given strict return precautions.  Gwyneth Sprout, MD 05/17/14 1424  Gwyneth Sprout, MD 05/17/14 1426

## 2014-05-17 NOTE — ED Notes (Signed)
Patient transported to X-ray 

## 2014-05-17 NOTE — ED Notes (Signed)
MD at bedside. 

## 2014-05-17 NOTE — ED Notes (Signed)
D/c home with her daughter- pt directed to pharmacy to pick up medications

## 2014-06-01 DIAGNOSIS — K219 Gastro-esophageal reflux disease without esophagitis: Secondary | ICD-10-CM | POA: Diagnosis not present

## 2014-06-01 DIAGNOSIS — R1084 Generalized abdominal pain: Secondary | ICD-10-CM | POA: Diagnosis not present

## 2014-06-01 DIAGNOSIS — Z09 Encounter for follow-up examination after completed treatment for conditions other than malignant neoplasm: Secondary | ICD-10-CM | POA: Diagnosis not present

## 2014-06-01 DIAGNOSIS — R600 Localized edema: Secondary | ICD-10-CM | POA: Diagnosis not present

## 2014-06-21 DIAGNOSIS — I6529 Occlusion and stenosis of unspecified carotid artery: Secondary | ICD-10-CM | POA: Diagnosis not present

## 2014-06-25 DIAGNOSIS — H25812 Combined forms of age-related cataract, left eye: Secondary | ICD-10-CM | POA: Diagnosis not present

## 2014-06-25 DIAGNOSIS — H2512 Age-related nuclear cataract, left eye: Secondary | ICD-10-CM | POA: Diagnosis not present

## 2014-06-29 DIAGNOSIS — E1129 Type 2 diabetes mellitus with other diabetic kidney complication: Secondary | ICD-10-CM | POA: Diagnosis not present

## 2014-06-29 DIAGNOSIS — E559 Vitamin D deficiency, unspecified: Secondary | ICD-10-CM | POA: Diagnosis not present

## 2014-07-03 DIAGNOSIS — E780 Pure hypercholesterolemia: Secondary | ICD-10-CM | POA: Diagnosis not present

## 2014-07-03 DIAGNOSIS — I10 Essential (primary) hypertension: Secondary | ICD-10-CM | POA: Diagnosis not present

## 2014-07-03 DIAGNOSIS — E1129 Type 2 diabetes mellitus with other diabetic kidney complication: Secondary | ICD-10-CM | POA: Diagnosis not present

## 2014-07-30 DIAGNOSIS — M79606 Pain in leg, unspecified: Secondary | ICD-10-CM | POA: Diagnosis not present

## 2014-07-30 DIAGNOSIS — R0602 Shortness of breath: Secondary | ICD-10-CM | POA: Diagnosis not present

## 2014-07-30 DIAGNOSIS — E041 Nontoxic single thyroid nodule: Secondary | ICD-10-CM | POA: Diagnosis not present

## 2014-07-30 DIAGNOSIS — I779 Disorder of arteries and arterioles, unspecified: Secondary | ICD-10-CM | POA: Diagnosis not present

## 2014-08-02 DIAGNOSIS — E119 Type 2 diabetes mellitus without complications: Secondary | ICD-10-CM | POA: Diagnosis not present

## 2014-08-27 DIAGNOSIS — M79606 Pain in leg, unspecified: Secondary | ICD-10-CM | POA: Diagnosis not present

## 2014-08-27 DIAGNOSIS — R0602 Shortness of breath: Secondary | ICD-10-CM | POA: Diagnosis not present

## 2014-08-27 DIAGNOSIS — E041 Nontoxic single thyroid nodule: Secondary | ICD-10-CM | POA: Diagnosis not present

## 2014-08-30 DIAGNOSIS — R0602 Shortness of breath: Secondary | ICD-10-CM | POA: Diagnosis not present

## 2014-09-01 DIAGNOSIS — K5900 Constipation, unspecified: Secondary | ICD-10-CM | POA: Diagnosis not present

## 2014-09-01 DIAGNOSIS — R739 Hyperglycemia, unspecified: Secondary | ICD-10-CM | POA: Diagnosis not present

## 2014-09-01 DIAGNOSIS — R1013 Epigastric pain: Secondary | ICD-10-CM | POA: Diagnosis not present

## 2014-09-07 DIAGNOSIS — R0602 Shortness of breath: Secondary | ICD-10-CM | POA: Diagnosis not present

## 2014-09-07 DIAGNOSIS — D869 Sarcoidosis, unspecified: Secondary | ICD-10-CM | POA: Diagnosis not present

## 2014-09-11 DIAGNOSIS — I10 Essential (primary) hypertension: Secondary | ICD-10-CM | POA: Diagnosis not present

## 2014-09-11 DIAGNOSIS — I6529 Occlusion and stenosis of unspecified carotid artery: Secondary | ICD-10-CM | POA: Diagnosis not present

## 2014-09-11 DIAGNOSIS — E041 Nontoxic single thyroid nodule: Secondary | ICD-10-CM | POA: Diagnosis not present

## 2014-09-11 DIAGNOSIS — I509 Heart failure, unspecified: Secondary | ICD-10-CM | POA: Diagnosis not present

## 2014-09-11 DIAGNOSIS — R0602 Shortness of breath: Secondary | ICD-10-CM | POA: Diagnosis not present

## 2014-09-11 DIAGNOSIS — N183 Chronic kidney disease, stage 3 (moderate): Secondary | ICD-10-CM | POA: Diagnosis not present

## 2014-09-11 DIAGNOSIS — E119 Type 2 diabetes mellitus without complications: Secondary | ICD-10-CM | POA: Diagnosis not present

## 2014-09-11 DIAGNOSIS — M79606 Pain in leg, unspecified: Secondary | ICD-10-CM | POA: Diagnosis not present

## 2014-09-26 DIAGNOSIS — I10 Essential (primary) hypertension: Secondary | ICD-10-CM | POA: Diagnosis not present

## 2014-09-26 DIAGNOSIS — N289 Disorder of kidney and ureter, unspecified: Secondary | ICD-10-CM | POA: Diagnosis not present

## 2014-09-26 DIAGNOSIS — E780 Pure hypercholesterolemia: Secondary | ICD-10-CM | POA: Diagnosis not present

## 2014-09-26 DIAGNOSIS — E559 Vitamin D deficiency, unspecified: Secondary | ICD-10-CM | POA: Diagnosis not present

## 2014-09-26 DIAGNOSIS — E1165 Type 2 diabetes mellitus with hyperglycemia: Secondary | ICD-10-CM | POA: Diagnosis not present

## 2014-09-26 DIAGNOSIS — E1129 Type 2 diabetes mellitus with other diabetic kidney complication: Secondary | ICD-10-CM | POA: Diagnosis not present

## 2014-10-02 DIAGNOSIS — D638 Anemia in other chronic diseases classified elsewhere: Secondary | ICD-10-CM | POA: Diagnosis not present

## 2014-10-02 DIAGNOSIS — I509 Heart failure, unspecified: Secondary | ICD-10-CM | POA: Diagnosis not present

## 2014-10-02 DIAGNOSIS — E876 Hypokalemia: Secondary | ICD-10-CM | POA: Diagnosis not present

## 2014-10-02 DIAGNOSIS — N183 Chronic kidney disease, stage 3 (moderate): Secondary | ICD-10-CM | POA: Diagnosis not present

## 2014-10-03 DIAGNOSIS — G5601 Carpal tunnel syndrome, right upper limb: Secondary | ICD-10-CM | POA: Diagnosis not present

## 2014-10-03 DIAGNOSIS — E1129 Type 2 diabetes mellitus with other diabetic kidney complication: Secondary | ICD-10-CM | POA: Diagnosis not present

## 2014-10-03 DIAGNOSIS — I509 Heart failure, unspecified: Secondary | ICD-10-CM | POA: Diagnosis not present

## 2014-10-03 DIAGNOSIS — E559 Vitamin D deficiency, unspecified: Secondary | ICD-10-CM | POA: Diagnosis not present

## 2014-10-03 DIAGNOSIS — I10 Essential (primary) hypertension: Secondary | ICD-10-CM | POA: Diagnosis not present

## 2014-10-03 DIAGNOSIS — Z23 Encounter for immunization: Secondary | ICD-10-CM | POA: Diagnosis not present

## 2014-10-03 DIAGNOSIS — G63 Polyneuropathy in diseases classified elsewhere: Secondary | ICD-10-CM | POA: Diagnosis not present

## 2014-10-03 DIAGNOSIS — N289 Disorder of kidney and ureter, unspecified: Secondary | ICD-10-CM | POA: Diagnosis not present

## 2014-10-03 DIAGNOSIS — M19012 Primary osteoarthritis, left shoulder: Secondary | ICD-10-CM | POA: Diagnosis not present

## 2014-10-03 DIAGNOSIS — M150 Primary generalized (osteo)arthritis: Secondary | ICD-10-CM | POA: Diagnosis not present

## 2014-10-09 DIAGNOSIS — N183 Chronic kidney disease, stage 3 (moderate): Secondary | ICD-10-CM | POA: Diagnosis not present

## 2014-10-18 ENCOUNTER — Encounter (HOSPITAL_BASED_OUTPATIENT_CLINIC_OR_DEPARTMENT_OTHER): Payer: Self-pay | Admitting: *Deleted

## 2014-10-18 ENCOUNTER — Emergency Department (HOSPITAL_BASED_OUTPATIENT_CLINIC_OR_DEPARTMENT_OTHER)
Admission: EM | Admit: 2014-10-18 | Discharge: 2014-10-19 | Disposition: A | Discharge status: Home / Self Care | Payer: Medicare Other | Attending: Emergency Medicine | Admitting: Emergency Medicine

## 2014-10-18 DIAGNOSIS — Z79899 Other long term (current) drug therapy: Secondary | ICD-10-CM | POA: Diagnosis not present

## 2014-10-18 DIAGNOSIS — Z7982 Long term (current) use of aspirin: Secondary | ICD-10-CM | POA: Diagnosis not present

## 2014-10-18 DIAGNOSIS — Z862 Personal history of diseases of the blood and blood-forming organs and certain disorders involving the immune mechanism: Secondary | ICD-10-CM | POA: Diagnosis not present

## 2014-10-18 DIAGNOSIS — R109 Unspecified abdominal pain: Secondary | ICD-10-CM | POA: Insufficient documentation

## 2014-10-18 DIAGNOSIS — Z8739 Personal history of other diseases of the musculoskeletal system and connective tissue: Secondary | ICD-10-CM | POA: Insufficient documentation

## 2014-10-18 DIAGNOSIS — E780 Pure hypercholesterolemia: Secondary | ICD-10-CM | POA: Insufficient documentation

## 2014-10-18 DIAGNOSIS — I10 Essential (primary) hypertension: Secondary | ICD-10-CM | POA: Insufficient documentation

## 2014-10-18 DIAGNOSIS — R739 Hyperglycemia, unspecified: Secondary | ICD-10-CM

## 2014-10-18 DIAGNOSIS — E1165 Type 2 diabetes mellitus with hyperglycemia: Secondary | ICD-10-CM | POA: Insufficient documentation

## 2014-10-18 DIAGNOSIS — Z87891 Personal history of nicotine dependence: Secondary | ICD-10-CM | POA: Insufficient documentation

## 2014-10-18 DIAGNOSIS — R05 Cough: Secondary | ICD-10-CM | POA: Diagnosis not present

## 2014-10-18 LAB — CBG MONITORING, ED: Glucose-Capillary: 535 mg/dL — ABNORMAL HIGH (ref 65–99)

## 2014-10-18 LAB — COMPREHENSIVE METABOLIC PANEL
ALBUMIN: 3.8 g/dL (ref 3.5–5.0)
ALK PHOS: 79 U/L (ref 38–126)
ALT: 13 U/L — ABNORMAL LOW (ref 14–54)
ANION GAP: 9 (ref 5–15)
AST: 18 U/L (ref 15–41)
BUN: 27 mg/dL — ABNORMAL HIGH (ref 6–20)
CALCIUM: 9.5 mg/dL (ref 8.9–10.3)
CHLORIDE: 96 mmol/L — AB (ref 101–111)
CO2: 28 mmol/L (ref 22–32)
Creatinine, Ser: 1.78 mg/dL — ABNORMAL HIGH (ref 0.44–1.00)
GFR calc non Af Amer: 24 mL/min — ABNORMAL LOW (ref >60)
GFR, EST AFRICAN AMERICAN: 28 mL/min — AB (ref >60)
Glucose, Bld: 588 mg/dL (ref 65–99)
Potassium: 4.9 mmol/L (ref 3.5–5.1)
Sodium: 133 mmol/L — ABNORMAL LOW (ref 135–145)
Total Bilirubin: 0.5 mg/dL (ref 0.3–1.2)
Total Protein: 6.7 g/dL (ref 6.5–8.1)

## 2014-10-18 LAB — CBC WITH DIFFERENTIAL/PLATELET
BASOS PCT: 0 % (ref 0–1)
Basophils Absolute: 0 10*3/uL (ref 0.0–0.1)
EOS ABS: 0.2 10*3/uL (ref 0.0–0.7)
Eosinophils Relative: 4 % (ref 0–5)
HCT: 37.4 % (ref 36.0–46.0)
HEMOGLOBIN: 12.4 g/dL (ref 12.0–15.0)
LYMPHS ABS: 1.2 10*3/uL (ref 0.7–4.0)
Lymphocytes Relative: 21 % (ref 12–46)
MCH: 30.8 pg (ref 26.0–34.0)
MCHC: 33.2 g/dL (ref 30.0–36.0)
MCV: 92.8 fL (ref 78.0–100.0)
MONO ABS: 0.7 10*3/uL (ref 0.1–1.0)
Monocytes Relative: 12 % (ref 3–12)
NEUTROS PCT: 63 % (ref 43–77)
Neutro Abs: 3.7 10*3/uL (ref 1.7–7.7)
PLATELETS: 181 10*3/uL (ref 150–400)
RBC: 4.03 MIL/uL (ref 3.87–5.11)
RDW: 12 % (ref 11.5–15.5)
WBC: 5.8 10*3/uL (ref 4.0–10.5)

## 2014-10-18 LAB — URINALYSIS, ROUTINE W REFLEX MICROSCOPIC
Bilirubin Urine: NEGATIVE
Glucose, UA: >1000 mg/dL — AB
Hgb urine dipstick: NEGATIVE
Ketones, ur: NEGATIVE mg/dL
Nitrite: NEGATIVE
Protein, ur: NEGATIVE mg/dL
SPECIFIC GRAVITY, URINE: 1.02 (ref 1.005–1.030)
UROBILINOGEN UA: 0.2 mg/dL (ref 0.0–1.0)
pH: 7 (ref 5.0–8.0)

## 2014-10-18 LAB — LIPASE, BLOOD: Lipase: 55 U/L — ABNORMAL HIGH (ref 22–51)

## 2014-10-18 LAB — URINE MICROSCOPIC-ADD ON

## 2014-10-18 MED ORDER — INSULIN ASPART 100 UNIT/ML ~~LOC~~ SOLN
SUBCUTANEOUS | Status: AC
  Administered 2014-10-18: 10 [IU] via INTRAVENOUS
  Filled 2014-10-18: qty 1

## 2014-10-18 MED ORDER — INSULIN ASPART 100 UNIT/ML IV SOLN
10.0000 [IU] | Freq: Once | INTRAVENOUS | Status: AC
  Administered 2014-10-18: 10 [IU] via INTRAVENOUS

## 2014-10-18 MED ORDER — SODIUM CHLORIDE 0.9 % IV BOLUS (SEPSIS)
500.0000 mL | Freq: Once | INTRAVENOUS | Status: AC
  Administered 2014-10-18: 500 mL via INTRAVENOUS

## 2014-10-18 NOTE — ED Notes (Signed)
CRITICAL VALUE ALERT  Critical value received:  CBG 588  Date of notification:  10/18/2014  Time of notification:  2150  Critical value read back:Yes.    Nurse who received alert:  je,r  MD notified (1st page):  Littie Deeds  Time of first page:  2150  MD notified (2nd page):  Time of second page:  Responding MD:  gentry  Time MD responded:  2150

## 2014-10-18 NOTE — ED Provider Notes (Signed)
CSN: [id number]     Arrival date & time 10/18/14  1908 History  This chart was scribed for Mirian Mo, MD by Doreatha Martin, ED Scribe. This patient was seen in room MH07/MH07 and the patient's care was started at 9:06 PM.     Chief Complaint  Patient presents with  . Hyperglycemia   Patient is a 81 y.o. female presenting with hyperglycemia. The history is provided by the patient. No language interpreter was used.  Hyperglycemia Blood sugar level PTA:  569 Severity:  Moderate Duration:  1 hour Progression:  Unchanged Chronicity:  Recurrent Diabetes status:  Controlled with oral medications Current diabetic therapy:  Glipizide  Relieved by:  Oral agents Associated symptoms: abdominal pain   Associated symptoms: no dysuria, no fever, no nausea and no vomiting    HPI Comments: Elizabeth Keller is a 81 y.o. female with Hx of DM, HTN, hypercholesterolemia, DDD, mild systolic heart failure who presents to the Emergency Department complaining of moderate  hyperglycemia (569 at home, 535 at triage) onset one hour ago with associated mild abdominal pain, dry cough. Pts DM is controlled with 10mg  bid glipizide. Last dose was this morning. Pt notes that she drinks orange juice when her CBG is low. She notes that she drinks sugar in her coffee and sometimes consumes carb heavy foods. Last cardiac stress test was 08/27/14. Pt states that she has no other symptoms. She denies nausea, vomiting, fever, dysuria.   Past Medical History  Diagnosis Date  . Diabetes mellitus   . Hypertension   . Sarcoidosis   . Hypercholesteremia   . Degenerative disk disease   . Spinal stenosis    Past Surgical History  Procedure Laterality Date  . Eye surgery      cataract   No family history on file. Social History  Substance Use Topics  . Smoking status: Former Games developer  . Smokeless tobacco: None  . Alcohol Use: No   OB History    No data available     Review of Systems  Constitutional: Negative for fever.   Respiratory: Positive for cough.   Gastrointestinal: Positive for abdominal pain. Negative for nausea and vomiting.  Genitourinary: Negative for dysuria.  Allergic/Immunologic:       Hyperglycemia   All other systems reviewed and are negative.  Allergies  Dicyclomine; Gabapentin; and Metformin and related  Home Medications   Prior to Admission medications   Medication Sig Start Date End Date Taking? Authorizing Provider  Potassium (POTASSIMIN PO) Take by mouth.   Yes Historical Provider, MD  aspirin 81 MG tablet Take 81 mg by mouth daily.    Historical Provider, MD  bisacodyl (FLEET) 10 MG/30ML ENEM Place 10 mg rectally once.    Historical Provider, MD  clonazePAM (KLONOPIN) 0.5 MG tablet Take 0.5 mg by mouth 2 (two) times daily as needed. For anxiety    Historical Provider, MD  diclofenac sodium (VOLTAREN) 1 % GEL Apply 2 g topically as needed.    Historical Provider, MD  dicyclomine (BENTYL) 20 MG tablet Take 20 mg by mouth every 6 (six) hours.    Historical Provider, MD  DULoxetine HCl (CYMBALTA PO) Take by mouth.    Historical Provider, MD  glipiZIDE (GLUCOTROL) 10 MG tablet Take 10 mg by mouth 2 (two) times daily before a meal.    Historical Provider, MD  megestrol (MEGACE) 20 MG tablet Take 20 mg by mouth daily.    Historical Provider, MD  mirtazapine (REMERON) 30 MG tablet Take 30 mg  by mouth at bedtime.    Historical Provider, MD  mometasone-formoterol (DULERA) 100-5 MCG/ACT AERO Inhale 2 puffs into the lungs 2 (two) times daily.    Historical Provider, MD  NIFEdipine (PROCARDIA-XL/ADALAT CC) 30 MG 24 hr tablet Take 30 mg by mouth daily.    Historical Provider, MD  omeprazole (PRILOSEC) 20 MG capsule Take 20 mg by mouth daily.    Historical Provider, MD  predniSONE (DELTASONE) 5 MG tablet Take 5 mg by mouth daily with breakfast.    Historical Provider, MD  simvastatin (ZOCOR) 40 MG tablet Take 40 mg by mouth every evening.    Historical Provider, MD  sucralfate (CARAFATE) 1 G  tablet Take 1 tablet (1 g total) by mouth 4 (four) times daily -  with meals and at bedtime. 05/17/14   Gwyneth Sprout, MD  TRAMADOL HCL PO Take by mouth.    Historical Provider, MD   BP 142/66 mmHg  Pulse 92  Temp(Src) 98.8 F (37.1 C) (Oral)  Resp 18  Ht 5\' 6"  (1.676 m)  Wt 151 lb (68.493 kg)  BMI 24.38 kg/m2  SpO2 97% Physical Exam  Constitutional: She is oriented to person, place, and time. She appears well-developed and well-nourished.  HENT:  Head: Normocephalic and atraumatic.  Right Ear: External ear normal.  Left Ear: External ear normal.  Eyes: Conjunctivae and EOM are normal. Pupils are equal, round, and reactive to light.  Neck: Normal range of motion. Neck supple.  Cardiovascular: Normal rate, regular rhythm, normal heart sounds and intact distal pulses.   Pulmonary/Chest: Effort normal and breath sounds normal.  Abdominal: Soft. Bowel sounds are normal. There is no tenderness.  Musculoskeletal: Normal range of motion.  Neurological: She is alert and oriented to person, place, and time.  Skin: Skin is warm and dry.  Vitals reviewed.   ED Course  Procedures (including critical care time) DIAGNOSTIC STUDIES: Oxygen Saturation is 100% on RA, normal by my interpretation.    COORDINATION OF CARE: 9:16 PM Discussed treatment plan with pt at bedside and pt agreed to plan.   Labs Review Labs Reviewed  URINALYSIS, ROUTINE W REFLEX MICROSCOPIC (NOT AT [facility]) - Abnormal; Notable for the following:    APPearance CLOUDY (*)    Glucose, UA >1000 (*)    Leukocytes, UA SMALL (*)    All other components within normal limits  COMPREHENSIVE METABOLIC PANEL - Abnormal; Notable for the following:    Sodium 133 (*)    Chloride 96 (*)    Glucose, Bld 588 (*)    BUN 27 (*)    Creatinine, Ser 1.78 (*)    ALT 13 (*)    GFR calc non Af Amer 24 (*)    GFR calc Af Amer 28 (*)    All other components within normal limits  LIPASE, BLOOD - Abnormal; Notable for the following:     Lipase 55 (*)    All other components within normal limits  URINE MICROSCOPIC-ADD ON - Abnormal; Notable for the following:    Squamous Epithelial / LPF FEW (*)    All other components within normal limits  CBG MONITORING, ED - Abnormal; Notable for the following:    Glucose-Capillary 535 (*)    All other components within normal limits  CBG MONITORING, ED - Abnormal; Notable for the following:    Glucose-Capillary 460 (*)    All other components within normal limits  CBG MONITORING, ED - Abnormal; Notable for the following:    Glucose-Capillary 293 (*)  All other components within normal limits  CBC WITH DIFFERENTIAL/PLATELET    Imaging Review No results found. I have personally reviewed and evaluated these images and lab results as part of my medical decision-making.   EKG Interpretation None      MDM   Final diagnoses:  Hyperglycemia    81 y.o. female with pertinent PMH of DM, Htn presents with asymptomatic hyperglycemia at home.  Glucose elevated to 569 at home.  Pt asymptomatic on arrival.  Has a longstanding ho dietary noncompliance.  Workup as above demonstrated hyperglycemia without DKA.also with mild elevation in creatinine compared to baseline, however is normally mildly elevated.  She was given 10 units of insulin and had appropriate relief  Of hyperglycemia.  Feel pt stable to dc home to fu with PCP  I have reviewed all laboratory and imaging studies if ordered as above  1. Hyperglycemia          Mirian Mo, MD 10/19/14 [phone]

## 2014-10-18 NOTE — ED Notes (Signed)
CBG was 569 an hour ago. No symptoms per daughter.

## 2014-10-19 DIAGNOSIS — E1165 Type 2 diabetes mellitus with hyperglycemia: Secondary | ICD-10-CM | POA: Diagnosis not present

## 2014-10-19 LAB — CBG MONITORING, ED
GLUCOSE-CAPILLARY: 293 mg/dL — AB (ref 65–99)
GLUCOSE-CAPILLARY: 460 mg/dL — AB (ref 65–99)

## 2014-10-19 MED ORDER — INSULIN ASPART 100 UNIT/ML IV SOLN: 10.0000 [IU] | Freq: Once | INTRAVENOUS | Status: DC

## 2014-10-19 MED ORDER — IPRATROPIUM-ALBUTEROL 0.5-2.5 (3) MG/3ML IN SOLN
3.0000 mL | Freq: Once | RESPIRATORY_TRACT | Status: AC
  Administered 2014-10-19: 3 mL via RESPIRATORY_TRACT
  Filled 2014-10-19: qty 3

## 2014-10-19 MED ORDER — SODIUM CHLORIDE 0.9 % IV BOLUS (SEPSIS): 500.0000 mL | Freq: Once | INTRAVENOUS | Status: DC

## 2014-10-19 NOTE — Discharge Instructions (Signed)
Hyperglycemia °Hyperglycemia occurs when the glucose (sugar) in your blood is too high. Hyperglycemia can happen for many reasons, but it most often happens to people who do not know they have diabetes or are not managing their diabetes properly.  °CAUSES  °Whether you have diabetes or not, there are other causes of hyperglycemia. Hyperglycemia can occur when you have diabetes, but it can also occur in other situations that you might not be as aware of, such as: °Diabetes °· If you have diabetes and are having problems controlling your blood glucose, hyperglycemia could occur because of some of the following reasons: °¨ Not following your meal plan. °¨ Not taking your diabetes medications or not taking it properly. °¨ Exercising less or doing less activity than you normally do. °¨ Being sick. °Pre-diabetes °· This cannot be ignored. Before people develop Type 2 diabetes, they almost always have "pre-diabetes." This is when your blood glucose levels are higher than normal, but not yet high enough to be diagnosed as diabetes. Research has shown that some long-term damage to the body, especially the heart and circulatory system, may already be occurring during pre-diabetes. If you take action to manage your blood glucose when you have pre-diabetes, you may delay or prevent Type 2 diabetes from developing. °Stress °· If you have diabetes, you may be "diet" controlled or on oral medications or insulin to control your diabetes. However, you may find that your blood glucose is higher than usual in the hospital whether you have diabetes or not. This is often referred to as "stress hyperglycemia." Stress can elevate your blood glucose. This happens because of hormones put out by the body during times of stress. If stress has been the cause of your high blood glucose, it can be followed regularly by your caregiver. That way he/she can make sure your hyperglycemia does not continue to get worse or progress to  diabetes. °Steroids °· Steroids are medications that act on the infection fighting system (immune system) to block inflammation or infection. One side effect can be a rise in blood glucose. Most people can produce enough extra insulin to allow for this rise, but for those who cannot, steroids make blood glucose levels go even higher. It is not unusual for steroid treatments to "uncover" diabetes that is developing. It is not always possible to determine if the hyperglycemia will go away after the steroids are stopped. A special blood test called an A1c is sometimes done to determine if your blood glucose was elevated before the steroids were started. °SYMPTOMS °· Thirsty. °· Frequent urination. °· Dry mouth. °· Blurred vision. °· Tired or fatigue. °· Weakness. °· Sleepy. °· Tingling in feet or leg. °DIAGNOSIS  °Diagnosis is made by monitoring blood glucose in one or all of the following ways: °· A1c test. This is a chemical found in your blood. °· Fingerstick blood glucose monitoring. °· Laboratory results. °TREATMENT  °First, knowing the cause of the hyperglycemia is important before the hyperglycemia can be treated. Treatment may include, but is not be limited to: °· Education. °· Change or adjustment in medications. °· Change or adjustment in meal plan. °· Treatment for an illness, infection, etc. °· More frequent blood glucose monitoring. °· Change in exercise plan. °· Decreasing or stopping steroids. °· Lifestyle changes. °HOME CARE INSTRUCTIONS  °· Test your blood glucose as directed. °· Exercise regularly. Your caregiver will give you instructions about exercise. Pre-diabetes or diabetes which comes on with stress is helped by exercising. °· Eat wholesome,   balanced meals. Eat often and at regular, fixed times. Your caregiver or nutritionist will give you a meal plan to guide your sugar intake. °· Being at an ideal weight is important. If needed, losing as little as 10 to 15 pounds may help improve blood  glucose levels. °SEEK MEDICAL CARE IF:  °· You have questions about medicine, activity, or diet. °· You continue to have symptoms (problems such as increased thirst, urination, or weight gain). °SEEK IMMEDIATE MEDICAL CARE IF:  °· You are vomiting or have diarrhea. °· Your breath smells fruity. °· You are breathing faster or slower. °· You are very sleepy or incoherent. °· You have numbness, tingling, or pain in your feet or hands. °· You have chest pain. °· Your symptoms get worse even though you have been following your caregiver's orders. °· If you have any other questions or concerns. °Document Released: 07/22/2000 Document Revised: 04/20/2011 Document Reviewed: 05/25/2011 °ExitCare® Patient Information ©2015 [employer]. This information is not intended to replace advice given to you by your health care provider. Make sure you discuss any questions you have with your health care provider. °Diabetes Mellitus and Food °It is important for you to manage your blood sugar (glucose) level. Your blood glucose level can be greatly affected by what you eat. Eating healthier foods in the appropriate amounts throughout the day at about the same time each day will help you control your blood glucose level. It can also help slow or prevent worsening of your diabetes mellitus. Healthy eating may even help you improve the level of your blood pressure and reach or maintain a healthy weight.  °HOW CAN FOOD AFFECT ME? °Carbohydrates °Carbohydrates affect your blood glucose level more than any other type of food. Your dietitian will help you determine how many carbohydrates to eat at each meal and teach you how to count carbohydrates. Counting carbohydrates is important to keep your blood glucose at a healthy level, especially if you are using insulin or taking certain medicines for diabetes mellitus. °Alcohol °Alcohol can cause sudden decreases in blood glucose (hypoglycemia), especially if you use insulin or take certain  medicines for diabetes mellitus. Hypoglycemia can be a life-threatening condition. Symptoms of hypoglycemia (sleepiness, dizziness, and disorientation) are similar to symptoms of having too much alcohol.  °If your health care provider has given you approval to drink alcohol, do so in moderation and use the following guidelines: °· Women should not have more than one drink per day, and men should not have more than two drinks per day. One drink is equal to: °¨ 12 oz of beer. °¨ 5 oz of wine. °¨ 1½ oz of hard liquor. °· Do not drink on an empty stomach. °· Keep yourself hydrated. Have water, diet soda, or unsweetened iced tea. °· Regular soda, juice, and other mixers might contain a lot of carbohydrates and should be counted. °WHAT FOODS ARE NOT RECOMMENDED? °As you make food choices, it is important to remember that all foods are not the same. Some foods have fewer nutrients per serving than other foods, even though they might have the same number of calories or carbohydrates. It is difficult to get your body what it needs when you eat foods with fewer nutrients. Examples of foods that you should avoid that are high in calories and carbohydrates but low in nutrients include: °· Trans fats (most processed foods list trans fats on the Nutrition Facts label). °· Regular soda. °· Juice. °· Candy. °· Sweets, such as cake, pie,   doughnuts, and cookies. °· Fried foods. °WHAT FOODS CAN I EAT? °Have nutrient-rich foods, which will nourish your body and keep you healthy. The food you should eat also will depend on several factors, including: °· The calories you need. °· The medicines you take. °· Your weight. °· Your blood glucose level. °· Your blood pressure level. °· Your cholesterol level. °You also should eat a variety of foods, including: °· Protein, such as meat, poultry, fish, tofu, nuts, and seeds (lean animal proteins are best). °· Fruits. °· Vegetables. °· Dairy products, such as milk, cheese, and yogurt (low fat is  best). °· Breads, grains, pasta, cereal, rice, and beans. °· Fats such as olive oil, trans fat-free margarine, canola oil, avocado, and olives. °DOES EVERYONE WITH DIABETES MELLITUS HAVE THE SAME MEAL PLAN? °Because every person with diabetes mellitus is different, there is not one meal plan that works for everyone. It is very important that you meet with a dietitian who will help you create a meal plan that is just right for you. °Document Released: 10/23/2004 Document Revised: 01/31/2013 Document Reviewed: 12/23/2012 °ExitCare® Patient Information ©2015 [employer]. This information is not intended to replace advice given to you by your health care provider. Make sure you discuss any questions you have with your health care provider. ° °

## 2014-10-20 DIAGNOSIS — I509 Heart failure, unspecified: Secondary | ICD-10-CM | POA: Diagnosis present

## 2014-10-20 DIAGNOSIS — Z9111 Patient's noncompliance with dietary regimen: Secondary | ICD-10-CM | POA: Diagnosis not present

## 2014-10-20 DIAGNOSIS — E1165 Type 2 diabetes mellitus with hyperglycemia: Secondary | ICD-10-CM | POA: Diagnosis not present

## 2014-10-20 DIAGNOSIS — R739 Hyperglycemia, unspecified: Secondary | ICD-10-CM | POA: Diagnosis not present

## 2014-10-20 DIAGNOSIS — E860 Dehydration: Secondary | ICD-10-CM | POA: Diagnosis present

## 2014-10-20 DIAGNOSIS — J45909 Unspecified asthma, uncomplicated: Secondary | ICD-10-CM | POA: Diagnosis not present

## 2014-10-20 DIAGNOSIS — N179 Acute kidney failure, unspecified: Secondary | ICD-10-CM | POA: Diagnosis not present

## 2014-10-20 DIAGNOSIS — R079 Chest pain, unspecified: Secondary | ICD-10-CM | POA: Diagnosis not present

## 2014-10-20 DIAGNOSIS — K589 Irritable bowel syndrome without diarrhea: Secondary | ICD-10-CM | POA: Diagnosis not present

## 2014-10-20 DIAGNOSIS — E1365 Other specified diabetes mellitus with hyperglycemia: Secondary | ICD-10-CM | POA: Diagnosis not present

## 2014-10-20 DIAGNOSIS — I10 Essential (primary) hypertension: Secondary | ICD-10-CM | POA: Diagnosis present

## 2014-10-26 DIAGNOSIS — E041 Nontoxic single thyroid nodule: Secondary | ICD-10-CM | POA: Diagnosis not present

## 2014-10-30 DIAGNOSIS — E876 Hypokalemia: Secondary | ICD-10-CM | POA: Diagnosis not present

## 2014-10-30 DIAGNOSIS — R0602 Shortness of breath: Secondary | ICD-10-CM | POA: Diagnosis not present

## 2014-10-30 DIAGNOSIS — I509 Heart failure, unspecified: Secondary | ICD-10-CM | POA: Diagnosis not present

## 2014-10-30 DIAGNOSIS — I779 Disorder of arteries and arterioles, unspecified: Secondary | ICD-10-CM | POA: Diagnosis not present

## 2014-10-30 DIAGNOSIS — N959 Unspecified menopausal and perimenopausal disorder: Secondary | ICD-10-CM | POA: Diagnosis not present

## 2014-10-30 DIAGNOSIS — N183 Chronic kidney disease, stage 3 (moderate): Secondary | ICD-10-CM | POA: Diagnosis not present

## 2014-11-07 DIAGNOSIS — Z87448 Personal history of other diseases of urinary system: Secondary | ICD-10-CM | POA: Diagnosis not present

## 2014-11-07 DIAGNOSIS — E1165 Type 2 diabetes mellitus with hyperglycemia: Secondary | ICD-10-CM | POA: Diagnosis not present

## 2014-11-07 DIAGNOSIS — Z09 Encounter for follow-up examination after completed treatment for conditions other than malignant neoplasm: Secondary | ICD-10-CM | POA: Diagnosis not present

## 2014-11-07 DIAGNOSIS — K580 Irritable bowel syndrome with diarrhea: Secondary | ICD-10-CM | POA: Diagnosis not present

## 2014-12-11 DIAGNOSIS — I509 Heart failure, unspecified: Secondary | ICD-10-CM | POA: Diagnosis not present

## 2014-12-11 DIAGNOSIS — N183 Chronic kidney disease, stage 3 (moderate): Secondary | ICD-10-CM | POA: Diagnosis not present

## 2014-12-25 DIAGNOSIS — Z23 Encounter for immunization: Secondary | ICD-10-CM | POA: Diagnosis not present

## 2014-12-25 DIAGNOSIS — D869 Sarcoidosis, unspecified: Secondary | ICD-10-CM | POA: Diagnosis not present

## 2014-12-25 DIAGNOSIS — R0602 Shortness of breath: Secondary | ICD-10-CM | POA: Diagnosis not present

## 2014-12-26 DIAGNOSIS — H26492 Other secondary cataract, left eye: Secondary | ICD-10-CM | POA: Diagnosis not present

## 2014-12-26 DIAGNOSIS — H04123 Dry eye syndrome of bilateral lacrimal glands: Secondary | ICD-10-CM | POA: Diagnosis not present

## 2015-01-17 DIAGNOSIS — E559 Vitamin D deficiency, unspecified: Secondary | ICD-10-CM | POA: Diagnosis not present

## 2015-01-17 DIAGNOSIS — E1129 Type 2 diabetes mellitus with other diabetic kidney complication: Secondary | ICD-10-CM | POA: Diagnosis not present

## 2015-01-29 DIAGNOSIS — I10 Essential (primary) hypertension: Secondary | ICD-10-CM | POA: Diagnosis not present

## 2015-01-29 DIAGNOSIS — N183 Chronic kidney disease, stage 3 (moderate): Secondary | ICD-10-CM | POA: Diagnosis not present

## 2015-01-29 DIAGNOSIS — D631 Anemia in chronic kidney disease: Secondary | ICD-10-CM | POA: Diagnosis not present

## 2015-01-29 DIAGNOSIS — N390 Urinary tract infection, site not specified: Secondary | ICD-10-CM | POA: Diagnosis not present

## 2015-02-08 DIAGNOSIS — L853 Xerosis cutis: Secondary | ICD-10-CM | POA: Diagnosis not present

## 2015-02-08 DIAGNOSIS — E7800 Pure hypercholesterolemia, unspecified: Secondary | ICD-10-CM | POA: Diagnosis not present

## 2015-02-08 DIAGNOSIS — E1165 Type 2 diabetes mellitus with hyperglycemia: Secondary | ICD-10-CM | POA: Diagnosis not present

## 2015-02-08 DIAGNOSIS — D649 Anemia, unspecified: Secondary | ICD-10-CM | POA: Diagnosis not present

## 2015-02-08 DIAGNOSIS — L309 Dermatitis, unspecified: Secondary | ICD-10-CM | POA: Diagnosis not present

## 2015-02-08 DIAGNOSIS — I10 Essential (primary) hypertension: Secondary | ICD-10-CM | POA: Diagnosis not present

## 2015-02-08 DIAGNOSIS — E1129 Type 2 diabetes mellitus with other diabetic kidney complication: Secondary | ICD-10-CM | POA: Diagnosis not present

## 2015-02-08 DIAGNOSIS — N189 Chronic kidney disease, unspecified: Secondary | ICD-10-CM | POA: Diagnosis not present

## 2015-02-10 HISTORY — PX: EYE SURGERY: SHX253

## 2015-03-07 DIAGNOSIS — T460X4A Poisoning by cardiac-stimulant glycosides and drugs of similar action, undetermined, initial encounter: Secondary | ICD-10-CM | POA: Diagnosis not present

## 2015-03-07 DIAGNOSIS — I10 Essential (primary) hypertension: Secondary | ICD-10-CM | POA: Diagnosis not present

## 2015-03-07 DIAGNOSIS — E559 Vitamin D deficiency, unspecified: Secondary | ICD-10-CM | POA: Diagnosis not present

## 2015-03-07 DIAGNOSIS — N183 Chronic kidney disease, stage 3 (moderate): Secondary | ICD-10-CM | POA: Diagnosis not present

## 2015-03-14 DIAGNOSIS — D631 Anemia in chronic kidney disease: Secondary | ICD-10-CM | POA: Diagnosis not present

## 2015-03-14 DIAGNOSIS — N390 Urinary tract infection, site not specified: Secondary | ICD-10-CM | POA: Diagnosis not present

## 2015-03-14 DIAGNOSIS — N183 Chronic kidney disease, stage 3 (moderate): Secondary | ICD-10-CM | POA: Diagnosis not present

## 2015-03-14 DIAGNOSIS — I10 Essential (primary) hypertension: Secondary | ICD-10-CM | POA: Diagnosis not present

## 2015-04-01 DIAGNOSIS — R0602 Shortness of breath: Secondary | ICD-10-CM | POA: Diagnosis not present

## 2015-04-01 DIAGNOSIS — I509 Heart failure, unspecified: Secondary | ICD-10-CM | POA: Diagnosis not present

## 2015-04-01 DIAGNOSIS — D869 Sarcoidosis, unspecified: Secondary | ICD-10-CM | POA: Diagnosis not present

## 2015-05-27 DIAGNOSIS — E1165 Type 2 diabetes mellitus with hyperglycemia: Secondary | ICD-10-CM | POA: Diagnosis not present

## 2015-05-27 DIAGNOSIS — E7800 Pure hypercholesterolemia, unspecified: Secondary | ICD-10-CM | POA: Diagnosis not present

## 2015-05-27 DIAGNOSIS — E1129 Type 2 diabetes mellitus with other diabetic kidney complication: Secondary | ICD-10-CM | POA: Diagnosis not present

## 2015-05-27 DIAGNOSIS — N189 Chronic kidney disease, unspecified: Secondary | ICD-10-CM | POA: Diagnosis not present

## 2015-05-27 DIAGNOSIS — D649 Anemia, unspecified: Secondary | ICD-10-CM | POA: Diagnosis not present

## 2015-06-06 DIAGNOSIS — N183 Chronic kidney disease, stage 3 (moderate): Secondary | ICD-10-CM | POA: Diagnosis not present

## 2015-06-11 DIAGNOSIS — N390 Urinary tract infection, site not specified: Secondary | ICD-10-CM | POA: Diagnosis not present

## 2015-06-11 DIAGNOSIS — I10 Essential (primary) hypertension: Secondary | ICD-10-CM | POA: Diagnosis not present

## 2015-06-11 DIAGNOSIS — D631 Anemia in chronic kidney disease: Secondary | ICD-10-CM | POA: Diagnosis not present

## 2015-06-11 DIAGNOSIS — N183 Chronic kidney disease, stage 3 (moderate): Secondary | ICD-10-CM | POA: Diagnosis not present

## 2015-06-12 DIAGNOSIS — R5381 Other malaise: Secondary | ICD-10-CM | POA: Diagnosis not present

## 2015-06-12 DIAGNOSIS — E7800 Pure hypercholesterolemia, unspecified: Secondary | ICD-10-CM | POA: Diagnosis not present

## 2015-06-12 DIAGNOSIS — R531 Weakness: Secondary | ICD-10-CM | POA: Diagnosis not present

## 2015-06-12 DIAGNOSIS — E1122 Type 2 diabetes mellitus with diabetic chronic kidney disease: Secondary | ICD-10-CM | POA: Diagnosis not present

## 2015-06-12 DIAGNOSIS — I10 Essential (primary) hypertension: Secondary | ICD-10-CM | POA: Diagnosis not present

## 2015-06-12 DIAGNOSIS — K5900 Constipation, unspecified: Secondary | ICD-10-CM | POA: Diagnosis not present

## 2015-06-12 DIAGNOSIS — R197 Diarrhea, unspecified: Secondary | ICD-10-CM | POA: Diagnosis not present

## 2015-06-12 DIAGNOSIS — K580 Irritable bowel syndrome with diarrhea: Secondary | ICD-10-CM | POA: Diagnosis not present

## 2015-06-12 DIAGNOSIS — F411 Generalized anxiety disorder: Secondary | ICD-10-CM | POA: Diagnosis not present

## 2015-06-20 DIAGNOSIS — I130 Hypertensive heart and chronic kidney disease with heart failure and stage 1 through stage 4 chronic kidney disease, or unspecified chronic kidney disease: Secondary | ICD-10-CM | POA: Diagnosis not present

## 2015-06-20 DIAGNOSIS — F329 Major depressive disorder, single episode, unspecified: Secondary | ICD-10-CM | POA: Diagnosis not present

## 2015-06-20 DIAGNOSIS — E1142 Type 2 diabetes mellitus with diabetic polyneuropathy: Secondary | ICD-10-CM | POA: Diagnosis not present

## 2015-06-20 DIAGNOSIS — M6281 Muscle weakness (generalized): Secondary | ICD-10-CM | POA: Diagnosis not present

## 2015-06-20 DIAGNOSIS — I509 Heart failure, unspecified: Secondary | ICD-10-CM | POA: Diagnosis not present

## 2015-06-20 DIAGNOSIS — N189 Chronic kidney disease, unspecified: Secondary | ICD-10-CM | POA: Diagnosis not present

## 2015-07-05 DIAGNOSIS — I130 Hypertensive heart and chronic kidney disease with heart failure and stage 1 through stage 4 chronic kidney disease, or unspecified chronic kidney disease: Secondary | ICD-10-CM | POA: Diagnosis not present

## 2015-07-05 DIAGNOSIS — F329 Major depressive disorder, single episode, unspecified: Secondary | ICD-10-CM | POA: Diagnosis not present

## 2015-07-05 DIAGNOSIS — E1142 Type 2 diabetes mellitus with diabetic polyneuropathy: Secondary | ICD-10-CM | POA: Diagnosis not present

## 2015-07-05 DIAGNOSIS — M6281 Muscle weakness (generalized): Secondary | ICD-10-CM | POA: Diagnosis not present

## 2015-07-05 DIAGNOSIS — N189 Chronic kidney disease, unspecified: Secondary | ICD-10-CM | POA: Diagnosis not present

## 2015-07-05 DIAGNOSIS — I509 Heart failure, unspecified: Secondary | ICD-10-CM | POA: Diagnosis not present

## 2015-07-09 DIAGNOSIS — E559 Vitamin D deficiency, unspecified: Secondary | ICD-10-CM | POA: Diagnosis not present

## 2015-07-09 DIAGNOSIS — R0602 Shortness of breath: Secondary | ICD-10-CM | POA: Diagnosis not present

## 2015-07-09 DIAGNOSIS — E782 Mixed hyperlipidemia: Secondary | ICD-10-CM | POA: Diagnosis not present

## 2015-07-09 DIAGNOSIS — E119 Type 2 diabetes mellitus without complications: Secondary | ICD-10-CM | POA: Diagnosis not present

## 2015-07-09 DIAGNOSIS — I509 Heart failure, unspecified: Secondary | ICD-10-CM | POA: Diagnosis not present

## 2015-07-10 DIAGNOSIS — I130 Hypertensive heart and chronic kidney disease with heart failure and stage 1 through stage 4 chronic kidney disease, or unspecified chronic kidney disease: Secondary | ICD-10-CM | POA: Diagnosis not present

## 2015-07-10 DIAGNOSIS — F329 Major depressive disorder, single episode, unspecified: Secondary | ICD-10-CM | POA: Diagnosis not present

## 2015-07-10 DIAGNOSIS — N189 Chronic kidney disease, unspecified: Secondary | ICD-10-CM | POA: Diagnosis not present

## 2015-07-10 DIAGNOSIS — E1142 Type 2 diabetes mellitus with diabetic polyneuropathy: Secondary | ICD-10-CM | POA: Diagnosis not present

## 2015-07-10 DIAGNOSIS — M6281 Muscle weakness (generalized): Secondary | ICD-10-CM | POA: Diagnosis not present

## 2015-07-10 DIAGNOSIS — I509 Heart failure, unspecified: Secondary | ICD-10-CM | POA: Diagnosis not present

## 2015-07-12 DIAGNOSIS — I130 Hypertensive heart and chronic kidney disease with heart failure and stage 1 through stage 4 chronic kidney disease, or unspecified chronic kidney disease: Secondary | ICD-10-CM | POA: Diagnosis not present

## 2015-07-12 DIAGNOSIS — I509 Heart failure, unspecified: Secondary | ICD-10-CM | POA: Diagnosis not present

## 2015-07-12 DIAGNOSIS — F329 Major depressive disorder, single episode, unspecified: Secondary | ICD-10-CM | POA: Diagnosis not present

## 2015-07-12 DIAGNOSIS — M6281 Muscle weakness (generalized): Secondary | ICD-10-CM | POA: Diagnosis not present

## 2015-07-12 DIAGNOSIS — N189 Chronic kidney disease, unspecified: Secondary | ICD-10-CM | POA: Diagnosis not present

## 2015-07-12 DIAGNOSIS — E1142 Type 2 diabetes mellitus with diabetic polyneuropathy: Secondary | ICD-10-CM | POA: Diagnosis not present

## 2015-07-17 DIAGNOSIS — I130 Hypertensive heart and chronic kidney disease with heart failure and stage 1 through stage 4 chronic kidney disease, or unspecified chronic kidney disease: Secondary | ICD-10-CM | POA: Diagnosis not present

## 2015-07-17 DIAGNOSIS — I509 Heart failure, unspecified: Secondary | ICD-10-CM | POA: Diagnosis not present

## 2015-07-17 DIAGNOSIS — F329 Major depressive disorder, single episode, unspecified: Secondary | ICD-10-CM | POA: Diagnosis not present

## 2015-07-17 DIAGNOSIS — N189 Chronic kidney disease, unspecified: Secondary | ICD-10-CM | POA: Diagnosis not present

## 2015-07-17 DIAGNOSIS — M6281 Muscle weakness (generalized): Secondary | ICD-10-CM | POA: Diagnosis not present

## 2015-07-17 DIAGNOSIS — E1142 Type 2 diabetes mellitus with diabetic polyneuropathy: Secondary | ICD-10-CM | POA: Diagnosis not present

## 2015-07-19 DIAGNOSIS — N189 Chronic kidney disease, unspecified: Secondary | ICD-10-CM | POA: Diagnosis not present

## 2015-07-19 DIAGNOSIS — I130 Hypertensive heart and chronic kidney disease with heart failure and stage 1 through stage 4 chronic kidney disease, or unspecified chronic kidney disease: Secondary | ICD-10-CM | POA: Diagnosis not present

## 2015-07-19 DIAGNOSIS — I509 Heart failure, unspecified: Secondary | ICD-10-CM | POA: Diagnosis not present

## 2015-07-19 DIAGNOSIS — E1142 Type 2 diabetes mellitus with diabetic polyneuropathy: Secondary | ICD-10-CM | POA: Diagnosis not present

## 2015-07-19 DIAGNOSIS — M6281 Muscle weakness (generalized): Secondary | ICD-10-CM | POA: Diagnosis not present

## 2015-07-19 DIAGNOSIS — F329 Major depressive disorder, single episode, unspecified: Secondary | ICD-10-CM | POA: Diagnosis not present

## 2015-07-24 DIAGNOSIS — I2781 Cor pulmonale (chronic): Secondary | ICD-10-CM | POA: Diagnosis not present

## 2015-07-24 DIAGNOSIS — R300 Dysuria: Secondary | ICD-10-CM | POA: Diagnosis not present

## 2015-07-24 DIAGNOSIS — N183 Chronic kidney disease, stage 3 (moderate): Secondary | ICD-10-CM | POA: Diagnosis not present

## 2015-07-24 DIAGNOSIS — R8271 Bacteriuria: Secondary | ICD-10-CM | POA: Diagnosis not present

## 2015-07-24 DIAGNOSIS — E782 Mixed hyperlipidemia: Secondary | ICD-10-CM | POA: Diagnosis not present

## 2015-07-24 DIAGNOSIS — E119 Type 2 diabetes mellitus without complications: Secondary | ICD-10-CM | POA: Diagnosis not present

## 2015-07-26 DIAGNOSIS — M6281 Muscle weakness (generalized): Secondary | ICD-10-CM | POA: Diagnosis not present

## 2015-07-26 DIAGNOSIS — E1142 Type 2 diabetes mellitus with diabetic polyneuropathy: Secondary | ICD-10-CM | POA: Diagnosis not present

## 2015-07-26 DIAGNOSIS — N189 Chronic kidney disease, unspecified: Secondary | ICD-10-CM | POA: Diagnosis not present

## 2015-07-26 DIAGNOSIS — F329 Major depressive disorder, single episode, unspecified: Secondary | ICD-10-CM | POA: Diagnosis not present

## 2015-07-26 DIAGNOSIS — I130 Hypertensive heart and chronic kidney disease with heart failure and stage 1 through stage 4 chronic kidney disease, or unspecified chronic kidney disease: Secondary | ICD-10-CM | POA: Diagnosis not present

## 2015-07-26 DIAGNOSIS — I509 Heart failure, unspecified: Secondary | ICD-10-CM | POA: Diagnosis not present

## 2015-07-30 DIAGNOSIS — E109 Type 1 diabetes mellitus without complications: Secondary | ICD-10-CM | POA: Diagnosis not present

## 2015-07-30 DIAGNOSIS — D869 Sarcoidosis, unspecified: Secondary | ICD-10-CM | POA: Diagnosis not present

## 2015-07-30 DIAGNOSIS — R600 Localized edema: Secondary | ICD-10-CM | POA: Diagnosis not present

## 2015-07-31 DIAGNOSIS — I509 Heart failure, unspecified: Secondary | ICD-10-CM | POA: Diagnosis not present

## 2015-07-31 DIAGNOSIS — F329 Major depressive disorder, single episode, unspecified: Secondary | ICD-10-CM | POA: Diagnosis not present

## 2015-07-31 DIAGNOSIS — E1142 Type 2 diabetes mellitus with diabetic polyneuropathy: Secondary | ICD-10-CM | POA: Diagnosis not present

## 2015-07-31 DIAGNOSIS — I130 Hypertensive heart and chronic kidney disease with heart failure and stage 1 through stage 4 chronic kidney disease, or unspecified chronic kidney disease: Secondary | ICD-10-CM | POA: Diagnosis not present

## 2015-07-31 DIAGNOSIS — M6281 Muscle weakness (generalized): Secondary | ICD-10-CM | POA: Diagnosis not present

## 2015-07-31 DIAGNOSIS — N189 Chronic kidney disease, unspecified: Secondary | ICD-10-CM | POA: Diagnosis not present

## 2015-09-13 DIAGNOSIS — E1122 Type 2 diabetes mellitus with diabetic chronic kidney disease: Secondary | ICD-10-CM | POA: Diagnosis not present

## 2015-09-13 DIAGNOSIS — E7800 Pure hypercholesterolemia, unspecified: Secondary | ICD-10-CM | POA: Diagnosis not present

## 2015-09-13 DIAGNOSIS — I10 Essential (primary) hypertension: Secondary | ICD-10-CM | POA: Diagnosis not present

## 2015-09-13 DIAGNOSIS — E042 Nontoxic multinodular goiter: Secondary | ICD-10-CM | POA: Diagnosis not present

## 2015-09-17 DIAGNOSIS — R0602 Shortness of breath: Secondary | ICD-10-CM | POA: Diagnosis not present

## 2015-09-17 DIAGNOSIS — R0989 Other specified symptoms and signs involving the circulatory and respiratory systems: Secondary | ICD-10-CM | POA: Diagnosis not present

## 2015-09-17 DIAGNOSIS — M79606 Pain in leg, unspecified: Secondary | ICD-10-CM | POA: Diagnosis not present

## 2015-09-17 DIAGNOSIS — N183 Chronic kidney disease, stage 3 (moderate): Secondary | ICD-10-CM | POA: Diagnosis not present

## 2015-09-18 DIAGNOSIS — F411 Generalized anxiety disorder: Secondary | ICD-10-CM | POA: Diagnosis not present

## 2015-09-18 DIAGNOSIS — E1165 Type 2 diabetes mellitus with hyperglycemia: Secondary | ICD-10-CM | POA: Diagnosis not present

## 2015-09-18 DIAGNOSIS — M8580 Other specified disorders of bone density and structure, unspecified site: Secondary | ICD-10-CM | POA: Diagnosis not present

## 2015-09-18 DIAGNOSIS — S59901A Unspecified injury of right elbow, initial encounter: Secondary | ICD-10-CM | POA: Diagnosis not present

## 2015-09-18 DIAGNOSIS — Z9181 History of falling: Secondary | ICD-10-CM | POA: Diagnosis not present

## 2015-09-18 DIAGNOSIS — S4991XA Unspecified injury of right shoulder and upper arm, initial encounter: Secondary | ICD-10-CM | POA: Diagnosis not present

## 2015-09-18 DIAGNOSIS — D649 Anemia, unspecified: Secondary | ICD-10-CM | POA: Diagnosis not present

## 2015-09-18 DIAGNOSIS — J929 Pleural plaque without asbestos: Secondary | ICD-10-CM | POA: Diagnosis not present

## 2015-09-18 DIAGNOSIS — S8011XA Contusion of right lower leg, initial encounter: Secondary | ICD-10-CM | POA: Diagnosis not present

## 2015-09-18 DIAGNOSIS — M19011 Primary osteoarthritis, right shoulder: Secondary | ICD-10-CM | POA: Diagnosis not present

## 2015-09-18 DIAGNOSIS — I10 Essential (primary) hypertension: Secondary | ICD-10-CM | POA: Diagnosis not present

## 2015-09-18 DIAGNOSIS — S6991XA Unspecified injury of right wrist, hand and finger(s), initial encounter: Secondary | ICD-10-CM | POA: Diagnosis not present

## 2015-09-18 DIAGNOSIS — S0093XA Contusion of unspecified part of head, initial encounter: Secondary | ICD-10-CM | POA: Diagnosis not present

## 2015-09-18 DIAGNOSIS — H578 Other specified disorders of eye and adnexa: Secondary | ICD-10-CM | POA: Diagnosis not present

## 2015-09-18 DIAGNOSIS — E1122 Type 2 diabetes mellitus with diabetic chronic kidney disease: Secondary | ICD-10-CM | POA: Diagnosis not present

## 2015-09-18 DIAGNOSIS — M79601 Pain in right arm: Secondary | ICD-10-CM | POA: Diagnosis not present

## 2015-09-18 DIAGNOSIS — S59911A Unspecified injury of right forearm, initial encounter: Secondary | ICD-10-CM | POA: Diagnosis not present

## 2015-09-24 DIAGNOSIS — R0602 Shortness of breath: Secondary | ICD-10-CM | POA: Diagnosis not present

## 2015-09-24 DIAGNOSIS — M79606 Pain in leg, unspecified: Secondary | ICD-10-CM | POA: Diagnosis not present

## 2015-09-24 DIAGNOSIS — R0989 Other specified symptoms and signs involving the circulatory and respiratory systems: Secondary | ICD-10-CM | POA: Diagnosis not present

## 2015-10-08 DIAGNOSIS — I739 Peripheral vascular disease, unspecified: Secondary | ICD-10-CM | POA: Diagnosis not present

## 2015-10-08 DIAGNOSIS — I6529 Occlusion and stenosis of unspecified carotid artery: Secondary | ICD-10-CM | POA: Diagnosis not present

## 2015-10-08 DIAGNOSIS — I10 Essential (primary) hypertension: Secondary | ICD-10-CM | POA: Diagnosis not present

## 2015-10-08 DIAGNOSIS — R0602 Shortness of breath: Secondary | ICD-10-CM | POA: Diagnosis not present

## 2015-12-03 DIAGNOSIS — N183 Chronic kidney disease, stage 3 (moderate): Secondary | ICD-10-CM | POA: Diagnosis not present

## 2015-12-03 DIAGNOSIS — D869 Sarcoidosis, unspecified: Secondary | ICD-10-CM | POA: Diagnosis not present

## 2015-12-19 ENCOUNTER — Emergency Department (HOSPITAL_BASED_OUTPATIENT_CLINIC_OR_DEPARTMENT_OTHER): Payer: Medicare Other

## 2015-12-19 ENCOUNTER — Emergency Department (HOSPITAL_BASED_OUTPATIENT_CLINIC_OR_DEPARTMENT_OTHER)
Admission: EM | Admit: 2015-12-19 | Discharge: 2015-12-19 | Disposition: A | Discharge status: Home / Self Care | Payer: Medicare Other | Attending: Emergency Medicine | Admitting: Emergency Medicine

## 2015-12-19 ENCOUNTER — Encounter (HOSPITAL_BASED_OUTPATIENT_CLINIC_OR_DEPARTMENT_OTHER): Payer: Self-pay | Admitting: Emergency Medicine

## 2015-12-19 DIAGNOSIS — I10 Essential (primary) hypertension: Secondary | ICD-10-CM | POA: Insufficient documentation

## 2015-12-19 DIAGNOSIS — Z7982 Long term (current) use of aspirin: Secondary | ICD-10-CM | POA: Insufficient documentation

## 2015-12-19 DIAGNOSIS — Z87891 Personal history of nicotine dependence: Secondary | ICD-10-CM | POA: Insufficient documentation

## 2015-12-19 DIAGNOSIS — K5900 Constipation, unspecified: Secondary | ICD-10-CM | POA: Diagnosis not present

## 2015-12-19 DIAGNOSIS — R079 Chest pain, unspecified: Secondary | ICD-10-CM | POA: Diagnosis not present

## 2015-12-19 DIAGNOSIS — J849 Interstitial pulmonary disease, unspecified: Secondary | ICD-10-CM | POA: Diagnosis not present

## 2015-12-19 DIAGNOSIS — E119 Type 2 diabetes mellitus without complications: Secondary | ICD-10-CM | POA: Insufficient documentation

## 2015-12-19 DIAGNOSIS — Z79899 Other long term (current) drug therapy: Secondary | ICD-10-CM | POA: Diagnosis not present

## 2015-12-19 DIAGNOSIS — R1011 Right upper quadrant pain: Secondary | ICD-10-CM | POA: Insufficient documentation

## 2015-12-19 DIAGNOSIS — R0602 Shortness of breath: Secondary | ICD-10-CM | POA: Insufficient documentation

## 2015-12-19 DIAGNOSIS — Z7984 Long term (current) use of oral hypoglycemic drugs: Secondary | ICD-10-CM | POA: Insufficient documentation

## 2015-12-19 DIAGNOSIS — R109 Unspecified abdominal pain: Secondary | ICD-10-CM | POA: Diagnosis present

## 2015-12-19 DIAGNOSIS — R0789 Other chest pain: Secondary | ICD-10-CM | POA: Diagnosis not present

## 2015-12-19 HISTORY — DX: Irritable bowel syndrome, unspecified: K58.9

## 2015-12-19 HISTORY — DX: Gastro-esophageal reflux disease without esophagitis: K21.9

## 2015-12-19 LAB — CBC WITH DIFFERENTIAL/PLATELET
BASOS ABS: 0 10*3/uL (ref 0.0–0.1)
BASOS PCT: 0 %
EOS ABS: 0.2 10*3/uL (ref 0.0–0.7)
Eosinophils Relative: 4 %
HCT: 34.4 % — ABNORMAL LOW (ref 36.0–46.0)
HEMOGLOBIN: 11.7 g/dL — AB (ref 12.0–15.0)
Lymphocytes Relative: 19 %
Lymphs Abs: 1.2 10*3/uL (ref 0.7–4.0)
MCH: 31.5 pg (ref 26.0–34.0)
MCHC: 34 g/dL (ref 30.0–36.0)
MCV: 92.7 fL (ref 78.0–100.0)
Monocytes Absolute: 0.7 10*3/uL (ref 0.1–1.0)
Monocytes Relative: 12 %
NEUTROS PCT: 65 %
Neutro Abs: 3.9 10*3/uL (ref 1.7–7.7)
Platelets: 193 10*3/uL (ref 150–400)
RBC: 3.71 MIL/uL — AB (ref 3.87–5.11)
RDW: 13.3 % (ref 11.5–15.5)
WBC: 6 10*3/uL (ref 4.0–10.5)

## 2015-12-19 LAB — COMPREHENSIVE METABOLIC PANEL
ALBUMIN: 4 g/dL (ref 3.5–5.0)
ALK PHOS: 67 U/L (ref 38–126)
ALT: 13 U/L — AB (ref 14–54)
ANION GAP: 9 (ref 5–15)
AST: 20 U/L (ref 15–41)
BUN: 24 mg/dL — ABNORMAL HIGH (ref 6–20)
CALCIUM: 9.8 mg/dL (ref 8.9–10.3)
CO2: 29 mmol/L (ref 22–32)
Chloride: 101 mmol/L (ref 101–111)
Creatinine, Ser: 1.35 mg/dL — ABNORMAL HIGH (ref 0.44–1.00)
GFR calc Af Amer: 39 mL/min — ABNORMAL LOW (ref >60)
GFR calc non Af Amer: 33 mL/min — ABNORMAL LOW (ref >60)
GLUCOSE: 239 mg/dL — AB (ref 65–99)
Potassium: 3.9 mmol/L (ref 3.5–5.1)
SODIUM: 139 mmol/L (ref 135–145)
Total Bilirubin: 0.4 mg/dL (ref 0.3–1.2)
Total Protein: 7 g/dL (ref 6.5–8.1)

## 2015-12-19 LAB — LIPASE, BLOOD: Lipase: 27 U/L (ref 11–51)

## 2015-12-19 LAB — TROPONIN I: Troponin I: <0.03 ng/mL (ref <0.03)

## 2015-12-19 LAB — URINALYSIS, ROUTINE W REFLEX MICROSCOPIC
BILIRUBIN URINE: NEGATIVE
Glucose, UA: NEGATIVE mg/dL
Hgb urine dipstick: NEGATIVE
Ketones, ur: NEGATIVE mg/dL
Leukocytes, UA: NEGATIVE
NITRITE: NEGATIVE
PH: 7.5 (ref 5.0–8.0)
Protein, ur: NEGATIVE mg/dL
SPECIFIC GRAVITY, URINE: 1.007 (ref 1.005–1.030)

## 2015-12-19 LAB — BRAIN NATRIURETIC PEPTIDE: B Natriuretic Peptide: 155 pg/mL — ABNORMAL HIGH (ref 0.0–100.0)

## 2015-12-19 NOTE — ED Notes (Signed)
Pt was assisted to BR by daughter and EMT; hat was placed for urine collection, but urine missed the hat. Unable to obtain specimen at this time; MD aware.

## 2015-12-19 NOTE — ED Notes (Signed)
Patient went to restroom, hat placed in toilet to collect urine sample, no urine was collected into hat. RN notified. Patient placed back on cardiac monitor.

## 2015-12-19 NOTE — ED Provider Notes (Signed)
[facility] Provider Note   CSN: [id number] Arrival date & time: 12/19/15  0919     History   Chief Complaint Chief Complaint  Patient presents with  . Abdominal Pain    HPI Elizabeth RoupVerna Keller is a 82 y.o. female.  HPI   Pain in right side for over one week, but this AM called with chest pain, felt like might lose breath but didn't.  Today had a "funny feeling" in her chest. Denies pressure, squeezing. Difficult to describe other than "funny feeling."   Right flank pain for last week, but didn't have the pain today.  Reports it is there when she turns on her side, severe with laying on that sid.e   Cardiologist Dr. Arnette FeltsMike Duran Nephrology Dr. Romeo AppleBen Past Medical History:  Diagnosis Date  . Degenerative disk disease   . Diabetes mellitus   . GERD (gastroesophageal reflux disease)   . Hypercholesteremia   . Hypertension   . IBS (irritable bowel syndrome)   . Sarcoidosis (HCC)   . Spinal stenosis     There are no active problems to display for this patient.   Past Surgical History:  Procedure Laterality Date  . EYE SURGERY     cataract    OB History    No data available       Home Medications    Prior to Admission medications   Medication Sig Start Date End Date Taking? Authorizing Provider  albuterol (PROVENTIL HFA;VENTOLIN HFA) 108 (90 Base) MCG/ACT inhaler Inhale into the lungs every 6 (six) hours as needed for wheezing or shortness of breath.   Yes Historical Provider, MD  Calcium & Magnesium Carbonates (MYLANTA PO) Take by mouth as needed.   Yes Historical Provider, MD  ipratropium-albuterol (DUONEB) 0.5-2.5 (3) MG/3ML SOLN Take 3 mLs by nebulization every 6 (six) hours as needed.   Yes Historical Provider, MD  Lactobacillus (PROBIOTIC ACIDOPHILUS PO) Take 1 tablet by mouth daily.   Yes Historical Provider, MD  Magnesium 400 MG TABS Take 400 mg by mouth.   Yes Historical Provider, MD  metFORMIN (GLUCOPHAGE) 500 MG tablet Take by mouth daily with  breakfast.   Yes Historical Provider, MD  polyethylene glycol (MIRALAX / GLYCOLAX) packet Take 17 g by mouth daily.   Yes Historical Provider, MD  potassium chloride SA (K-DUR,KLOR-CON) 20 MEQ tablet Take 20 mEq by mouth daily.   Yes Historical Provider, MD  torsemide (DEMADEX) 20 MG tablet Take 20 mg by mouth daily.   Yes Historical Provider, MD  triamcinolone cream (KENALOG) 0.5 % Apply 1 application topically 2 (two) times daily.   Yes Historical Provider, MD  aspirin 81 MG tablet Take 81 mg by mouth daily.    Historical Provider, MD  bisacodyl (FLEET) 10 MG/30ML ENEM Place 10 mg rectally once.    Historical Provider, MD  clonazePAM (KLONOPIN) 0.5 MG tablet Take 0.5 mg by mouth 2 (two) times daily as needed. 1/2 tab q am; whole tab at 2pm; may take 1/2 tab between morning and 2pm dose, then delay 2pm dose till 7pm For anxiety    Historical Provider, MD  diclofenac sodium (VOLTAREN) 1 % GEL Apply 2 g topically as needed.    Historical Provider, MD  dicyclomine (BENTYL) 20 MG tablet Take 20 mg by mouth every 6 (six) hours.    Historical Provider, MD  DULoxetine HCl (CYMBALTA PO) Take 60 mg by mouth daily.     Historical Provider, MD  glipiZIDE (GLUCOTROL) 10 MG tablet Take 10 mg  by mouth daily.     Historical Provider, MD  megestrol (MEGACE) 20 MG tablet Take 20 mg by mouth 2 (two) times daily.     Historical Provider, MD  mirtazapine (REMERON) 30 MG tablet Take 30 mg by mouth at bedtime.    Historical Provider, MD  mometasone-formoterol (DULERA) 100-5 MCG/ACT AERO Inhale 2 puffs into the lungs 2 (two) times daily.    Historical Provider, MD  NIFEdipine (PROCARDIA-XL/ADALAT CC) 30 MG 24 hr tablet Take 30 mg by mouth daily.    Historical Provider, MD  omeprazole (PRILOSEC) 20 MG capsule Take 40 mg by mouth daily.     Historical Provider, MD  Potassium (POTASSIMIN PO) Take by mouth.    Historical Provider, MD  predniSONE (DELTASONE) 5 MG tablet Take 5 mg by mouth daily with breakfast.    Historical  Provider, MD  simvastatin (ZOCOR) 40 MG tablet Take 40 mg by mouth every evening.    Historical Provider, MD  sucralfate (CARAFATE) 1 G tablet Take 1 tablet (1 g total) by mouth 4 (four) times daily -  with meals and at bedtime. 05/17/14   Gwyneth Sprout, MD  TRAMADOL HCL PO Take by mouth.    Historical Provider, MD    Family History History reviewed. No pertinent family history.  Social History Social History  Substance Use Topics  . Smoking status: Former Games developer  . Smokeless tobacco: Never Used  . Alcohol use No     Allergies   Dicyclomine; Gabapentin; and Metformin and related   Review of Systems Review of Systems  Constitutional: Negative for appetite change and fever.  HENT: Negative for congestion.   Eyes: Negative for visual disturbance.  Respiratory: Negative for cough (sometimes chronic) and shortness of breath ("like it might start but didn't").   Cardiovascular: Positive for chest pain ("funny feeling").  Gastrointestinal: Positive for constipation (small  BM today and yesterday). Negative for abdominal pain (feels funny in stomach most of the time, hx of stomach problems, gas on stomach, IBS), diarrhea, nausea and vomiting.  Genitourinary: Positive for flank pain. Negative for dysuria, vaginal bleeding and vaginal discharge.  Musculoskeletal: Negative for back pain.  Skin: Negative for rash.  Neurological: Negative for headaches.     Physical Exam Updated Vital Signs BP 141/79   Pulse 84   Temp 98.3 F (36.8 C)   Resp 16   Ht 5\' 4"  (1.626 m)   Wt 150 lb (68 kg)   SpO2 97%   BMI 25.75 kg/m   Physical Exam  Constitutional: She is oriented to person, place, and time. She appears well-developed and well-nourished. No distress.  HENT:  Head: Normocephalic and atraumatic.  Eyes: Conjunctivae and EOM are normal.  Neck: Normal range of motion.  Cardiovascular: Normal rate, regular rhythm, normal heart sounds and intact distal pulses.  Exam reveals no gallop  and no friction rub.   No murmur heard. Pulmonary/Chest: Effort normal and breath sounds normal. No respiratory distress. She has no wheezes. She has no rales. She exhibits tenderness (right lower chest wall pain).  Abdominal: Soft. She exhibits no distension. There is tenderness (mild RUQ tenderness,). There is no guarding.  Musculoskeletal: She exhibits edema. She exhibits no tenderness.  Neurological: She is alert and oriented to person, place, and time.  Skin: Skin is warm and dry. No rash noted. She is not diaphoretic. No erythema.  Nursing note and vitals reviewed.    ED Treatments / Results  Labs (all labs ordered are listed, but only abnormal  results are displayed) Labs Reviewed  CBC WITH DIFFERENTIAL/PLATELET - Abnormal; Notable for the following:       Result Value   RBC 3.71 (*)    Hemoglobin 11.7 (*)    HCT 34.4 (*)    All other components within normal limits  COMPREHENSIVE METABOLIC PANEL - Abnormal; Notable for the following:    Glucose, Bld 239 (*)    BUN 24 (*)    Creatinine, Ser 1.35 (*)    ALT 13 (*)    GFR calc non Af Amer 33 (*)    GFR calc Af Amer 39 (*)    All other components within normal limits  BRAIN NATRIURETIC PEPTIDE - Abnormal; Notable for the following:    B Natriuretic Peptide 155.0 (*)    All other components within normal limits  URINE CULTURE  TROPONIN I  LIPASE, BLOOD  URINALYSIS, ROUTINE W REFLEX MICROSCOPIC (NOT AT [facility])  TROPONIN I    EKG  EKG Interpretation  Date/Time:  Thursday December 19 2015 10:30:22 EST Ventricular Rate:  93 PR Interval:    QRS Duration: 158 QT Interval:  375 QTC Calculation: 467 R Axis:   -75 Text Interpretation:  Sinus tachycardia Atrial premature complexes Probable left atrial enlargement RBBB and LAFB Left ventricular hypertrophy Artifact in lead(s) I II III aVR aVL aVF and baseline wander in lead(s) II III aVF No significant change since last tracing Confirmed by [facility] MD, Keevan Wolz ([id number]) on  12/19/2015 10:37:04 AM       Radiology Dg Chest 2 View  Result Date: 12/19/2015 CLINICAL DATA:  Pain. EXAM: CHEST  2 VIEW COMPARISON:  10/20/2014 . FINDINGS: Prominence of the thoracic aorta again noted. Aortic aneurysm cannot be excluded. No change from prior exams. Stable mild cardiomegaly. Biapical interstitial prominence with pleural thickening noted. These findings are consistent chronic interstitial lung disease with pleural parenchymal scarring. No acute pulmonary disease. Degenerative changes thoracic spine. IMPRESSION: 1. Chronic bilateral upper lobe interstitial lung disease with prominent changes of pleural parenchymal scarring. 2. Stable cardiomegaly. Stable prominence of the thoracic aorta. Thoracic aortic aneurysm cannot be excluded . Electronically Signed   By: Maisie Fus  Register   On: 12/19/2015 10:48   US Abdomen Limited Ruq  Result Date: 12/19/2015 CLINICAL DATA:  2-3 weeks of right upper quadrant pain. History of diabetes, hypercholesterolemia, sarcoidosis. EXAM: US ABDOMEN LIMITED - RIGHT UPPER QUADRANT COMPARISON:  Limited right upper quadrant ultrasound of Jun 14, 2013 FINDINGS: Gallbladder: No gallstones or wall thickening visualized. No sonographic Murphy sign noted by sonographer. Common bile duct: Diameter: 5.3 mm Liver: No focal lesion identified. Within normal limits in parenchymal echogenicity. Two small lymph nodes measuring 1.4 and 1.9 cm are observed in the hilum of the liver. IMPRESSION: Normal limited right upper quadrant ultrasound. Further evaluation with nuclear medicine hepatobiliary scanning and gallbladder ejection fraction determination may be useful if chronic cholecystitis is suspected. Electronically Signed   By: David  [place] M.D.   On: 12/19/2015 13:00    Procedures Procedures (including critical care time)  Medications Ordered in ED Medications - No data to display   Initial Impression / Assessment and Plan / ED Course  I have reviewed the triage  vital signs and the nursing notes.  Pertinent labs & imaging results that were available during my care of the patient were reviewed by me and considered in my medical decision making (see chart for details).  Clinical Course    82 year old female with history of CHF, sarcoidosis, diabetes, hypertension, hyperlipidemia presents with  concern for right upper quadrant discomfort for 1 week, and a "funny feeling" in her chest this morning. EKG shows artifact, however suspect sinus rhythm, with similar right bundle branch block and left anterior fascicular block to prior.  CMP shows no significant changes. Lipase WNL. Urinalysis without signs of UTI. Chest x-ray shows no sign of pneumonia or mediastinal widening. Low suspicion for dissection given this, normal pulses bilaterally. Patient without tachypnea, without hypoxia, without asymmetric leg swelling, and low suspicion for pulmonary embolus. Troponin x 2 negative and doubt ACS, no return of "funny feeling in chest" in ED. Given nonspecific symptoms, and lack of same here feel outpatient follow up with Cardiologist is appropriate.  BNP 151 and doubt acute CHF exacerbation as etiology of symptoms.  Ordered RUQ US which shows no sign of GB disease as etiology of right sided pain. Suspect this is musculoskeletal. Recommend PCP follow-up as well as follow-up with cardiologist.  Final Clinical Impressions(s) / ED Diagnoses   Final diagnoses:  Right upper quadrant abdominal pain  Chest pain, unspecified type    New Prescriptions Discharge Medication List as of 12/19/2015  3:13 PM       Alvira MondayErin Belisa Eichholz, MD 12/19/15 [phone]

## 2015-12-19 NOTE — ED Triage Notes (Signed)
Pt c/o RUQ (side) pain; sts feels like a knot x approx 2 wks; also c/o "funny feeling" in mid chest this a.m. Denies that it was pain, [facility]

## 2015-12-20 DIAGNOSIS — D649 Anemia, unspecified: Secondary | ICD-10-CM | POA: Diagnosis not present

## 2015-12-20 DIAGNOSIS — E1122 Type 2 diabetes mellitus with diabetic chronic kidney disease: Secondary | ICD-10-CM | POA: Diagnosis not present

## 2015-12-20 DIAGNOSIS — E1165 Type 2 diabetes mellitus with hyperglycemia: Secondary | ICD-10-CM | POA: Diagnosis not present

## 2015-12-20 DIAGNOSIS — E559 Vitamin D deficiency, unspecified: Secondary | ICD-10-CM | POA: Diagnosis not present

## 2015-12-21 LAB — URINE CULTURE

## 2015-12-22 ENCOUNTER — Telehealth (HOSPITAL_BASED_OUTPATIENT_CLINIC_OR_DEPARTMENT_OTHER): Payer: Self-pay

## 2015-12-22 NOTE — Telephone Encounter (Signed)
No Abx needed for UC per A. Harris PA-C

## 2015-12-22 NOTE — Progress Notes (Signed)
ED Antimicrobial Stewardship Positive Culture Follow Up   [name] is an 82 y.o. female who [facility] with a chief complaint of  Chief Complaint  Patient presents with  . Abdominal Pain    Recent Results (from the past 720 hour(s))  Urine culture     Status: Abnormal   Collection Time: 12/19/15  2:42 PM  Result Value Ref Range Status   Specimen Description URINE, RANDOM  Final   Special Requests NONE  Final   Culture 60,000 COLONIES/mL ESCHERICHIA COLI (A)  Final   Report Status 12/21/2015 FINAL  Final   Organism ID, Bacteria ESCHERICHIA COLI (A)  Final      Susceptibility   Escherichia coli - MIC*    AMPICILLIN 8 SENSITIVE Sensitive     CEFAZOLIN <=4 SENSITIVE Sensitive     CEFTRIAXONE <=1 SENSITIVE Sensitive     CIPROFLOXACIN <=0.25 SENSITIVE Sensitive     GENTAMICIN <=1 SENSITIVE Sensitive     IMIPENEM <=0.25 SENSITIVE Sensitive     NITROFURANTOIN <=16 SENSITIVE Sensitive     TRIMETH/SULFA <=20 SENSITIVE Sensitive     AMPICILLIN/SULBACTAM 4 SENSITIVE Sensitive     PIP/TAZO <=4 SENSITIVE Sensitive     Extended ESBL NEGATIVE Sensitive     * 60,000 COLONIES/mL ESCHERICHIA COLI   No signs of infection, no abx indicated  ED Provider: Arthor CaptainAbigail Harris, Langston MaskerPa-C  Rosselyn Martha A Rontae Inglett 12/22/2015, 8:54 AM Infectious Diseases Pharmacist Phone# [phone]

## 2015-12-23 DIAGNOSIS — N183 Chronic kidney disease, stage 3 (moderate): Secondary | ICD-10-CM | POA: Diagnosis not present

## 2015-12-23 DIAGNOSIS — N390 Urinary tract infection, site not specified: Secondary | ICD-10-CM | POA: Diagnosis not present

## 2015-12-23 DIAGNOSIS — D631 Anemia in chronic kidney disease: Secondary | ICD-10-CM | POA: Diagnosis not present

## 2015-12-23 DIAGNOSIS — I10 Essential (primary) hypertension: Secondary | ICD-10-CM | POA: Diagnosis not present

## 2015-12-25 DIAGNOSIS — E1165 Type 2 diabetes mellitus with hyperglycemia: Secondary | ICD-10-CM | POA: Diagnosis not present

## 2015-12-25 DIAGNOSIS — E1122 Type 2 diabetes mellitus with diabetic chronic kidney disease: Secondary | ICD-10-CM | POA: Diagnosis not present

## 2015-12-25 DIAGNOSIS — R5383 Other fatigue: Secondary | ICD-10-CM | POA: Diagnosis not present

## 2015-12-25 DIAGNOSIS — R0602 Shortness of breath: Secondary | ICD-10-CM | POA: Diagnosis not present

## 2015-12-25 DIAGNOSIS — I452 Bifascicular block: Secondary | ICD-10-CM | POA: Diagnosis not present

## 2015-12-25 DIAGNOSIS — R600 Localized edema: Secondary | ICD-10-CM | POA: Diagnosis not present

## 2015-12-25 DIAGNOSIS — I10 Essential (primary) hypertension: Secondary | ICD-10-CM | POA: Diagnosis not present

## 2015-12-25 DIAGNOSIS — D649 Anemia, unspecified: Secondary | ICD-10-CM | POA: Diagnosis not present

## 2015-12-25 DIAGNOSIS — N289 Disorder of kidney and ureter, unspecified: Secondary | ICD-10-CM | POA: Diagnosis not present

## 2015-12-25 DIAGNOSIS — F411 Generalized anxiety disorder: Secondary | ICD-10-CM | POA: Diagnosis not present

## 2016-03-14 ENCOUNTER — Encounter (HOSPITAL_COMMUNITY): Payer: Self-pay | Admitting: Certified Nurse Midwife

## 2016-03-14 ENCOUNTER — Emergency Department (HOSPITAL_COMMUNITY): Payer: Medicare Other

## 2016-03-14 ENCOUNTER — Inpatient Hospital Stay (HOSPITAL_COMMUNITY)
Admission: EM | Admit: 2016-03-14 | Discharge: 2016-03-19 | DRG: 871 | Disposition: A | Discharge status: Home Health | Payer: Medicare Other | Attending: Internal Medicine | Admitting: Internal Medicine

## 2016-03-14 DIAGNOSIS — I10 Essential (primary) hypertension: Secondary | ICD-10-CM | POA: Diagnosis not present

## 2016-03-14 DIAGNOSIS — E118 Type 2 diabetes mellitus with unspecified complications: Secondary | ICD-10-CM | POA: Diagnosis not present

## 2016-03-14 DIAGNOSIS — F419 Anxiety disorder, unspecified: Secondary | ICD-10-CM | POA: Diagnosis present

## 2016-03-14 DIAGNOSIS — Z66 Do not resuscitate: Secondary | ICD-10-CM | POA: Diagnosis present

## 2016-03-14 DIAGNOSIS — E7800 Pure hypercholesterolemia, unspecified: Secondary | ICD-10-CM | POA: Diagnosis present

## 2016-03-14 DIAGNOSIS — R627 Adult failure to thrive: Secondary | ICD-10-CM | POA: Diagnosis present

## 2016-03-14 DIAGNOSIS — D649 Anemia, unspecified: Secondary | ICD-10-CM | POA: Diagnosis not present

## 2016-03-14 DIAGNOSIS — R0600 Dyspnea, unspecified: Secondary | ICD-10-CM | POA: Diagnosis not present

## 2016-03-14 DIAGNOSIS — J1000 Influenza due to other identified influenza virus with unspecified type of pneumonia: Secondary | ICD-10-CM | POA: Diagnosis present

## 2016-03-14 DIAGNOSIS — Z87891 Personal history of nicotine dependence: Secondary | ICD-10-CM | POA: Diagnosis not present

## 2016-03-14 DIAGNOSIS — D869 Sarcoidosis, unspecified: Secondary | ICD-10-CM | POA: Diagnosis present

## 2016-03-14 DIAGNOSIS — J189 Pneumonia, unspecified organism: Secondary | ICD-10-CM

## 2016-03-14 DIAGNOSIS — E785 Hyperlipidemia, unspecified: Secondary | ICD-10-CM | POA: Diagnosis present

## 2016-03-14 DIAGNOSIS — Z7982 Long term (current) use of aspirin: Secondary | ICD-10-CM

## 2016-03-14 DIAGNOSIS — I5032 Chronic diastolic (congestive) heart failure: Secondary | ICD-10-CM | POA: Diagnosis present

## 2016-03-14 DIAGNOSIS — S06350A Traumatic hemorrhage of left cerebrum without loss of consciousness, initial encounter: Secondary | ICD-10-CM | POA: Diagnosis present

## 2016-03-14 DIAGNOSIS — R296 Repeated falls: Secondary | ICD-10-CM | POA: Diagnosis present

## 2016-03-14 DIAGNOSIS — A419 Sepsis, unspecified organism: Secondary | ICD-10-CM | POA: Diagnosis not present

## 2016-03-14 DIAGNOSIS — N183 Chronic kidney disease, stage 3 unspecified: Secondary | ICD-10-CM | POA: Diagnosis present

## 2016-03-14 DIAGNOSIS — K219 Gastro-esophageal reflux disease without esophagitis: Secondary | ICD-10-CM | POA: Diagnosis present

## 2016-03-14 DIAGNOSIS — Z888 Allergy status to other drugs, medicaments and biological substances status: Secondary | ICD-10-CM | POA: Diagnosis not present

## 2016-03-14 DIAGNOSIS — I6789 Other cerebrovascular disease: Secondary | ICD-10-CM | POA: Diagnosis not present

## 2016-03-14 DIAGNOSIS — R6510 Systemic inflammatory response syndrome (SIRS) of non-infectious origin without acute organ dysfunction: Secondary | ICD-10-CM | POA: Diagnosis not present

## 2016-03-14 DIAGNOSIS — G9341 Metabolic encephalopathy: Secondary | ICD-10-CM | POA: Diagnosis present

## 2016-03-14 DIAGNOSIS — Z7984 Long term (current) use of oral hypoglycemic drugs: Secondary | ICD-10-CM

## 2016-03-14 DIAGNOSIS — I611 Nontraumatic intracerebral hemorrhage in hemisphere, cortical: Secondary | ICD-10-CM | POA: Diagnosis not present

## 2016-03-14 DIAGNOSIS — J111 Influenza due to unidentified influenza virus with other respiratory manifestations: Secondary | ICD-10-CM | POA: Diagnosis not present

## 2016-03-14 DIAGNOSIS — M6281 Muscle weakness (generalized): Secondary | ICD-10-CM

## 2016-03-14 DIAGNOSIS — Y92009 Unspecified place in unspecified non-institutional (private) residence as the place of occurrence of the external cause: Secondary | ICD-10-CM

## 2016-03-14 DIAGNOSIS — F4489 Other dissociative and conversion disorders: Secondary | ICD-10-CM | POA: Diagnosis not present

## 2016-03-14 DIAGNOSIS — E1122 Type 2 diabetes mellitus with diabetic chronic kidney disease: Secondary | ICD-10-CM | POA: Diagnosis present

## 2016-03-14 DIAGNOSIS — Z79899 Other long term (current) drug therapy: Secondary | ICD-10-CM

## 2016-03-14 DIAGNOSIS — R4182 Altered mental status, unspecified: Secondary | ICD-10-CM | POA: Diagnosis not present

## 2016-03-14 DIAGNOSIS — J101 Influenza due to other identified influenza virus with other respiratory manifestations: Secondary | ICD-10-CM | POA: Diagnosis not present

## 2016-03-14 DIAGNOSIS — I130 Hypertensive heart and chronic kidney disease with heart failure and stage 1 through stage 4 chronic kidney disease, or unspecified chronic kidney disease: Secondary | ICD-10-CM | POA: Diagnosis present

## 2016-03-14 DIAGNOSIS — I619 Nontraumatic intracerebral hemorrhage, unspecified: Secondary | ICD-10-CM

## 2016-03-14 DIAGNOSIS — S06359A Traumatic hemorrhage of left cerebrum with loss of consciousness of unspecified duration, initial encounter: Secondary | ICD-10-CM | POA: Diagnosis not present

## 2016-03-14 DIAGNOSIS — W19XXXA Unspecified fall, initial encounter: Secondary | ICD-10-CM | POA: Diagnosis present

## 2016-03-14 DIAGNOSIS — K589 Irritable bowel syndrome without diarrhea: Secondary | ICD-10-CM | POA: Diagnosis present

## 2016-03-14 DIAGNOSIS — I5030 Unspecified diastolic (congestive) heart failure: Secondary | ICD-10-CM | POA: Diagnosis not present

## 2016-03-14 DIAGNOSIS — E119 Type 2 diabetes mellitus without complications: Secondary | ICD-10-CM | POA: Diagnosis not present

## 2016-03-14 LAB — COMPREHENSIVE METABOLIC PANEL
ALT: 52 U/L (ref 14–54)
AST: 69 U/L — AB (ref 15–41)
Albumin: 3.8 g/dL (ref 3.5–5.0)
Alkaline Phosphatase: 99 U/L (ref 38–126)
Anion gap: 11 (ref 5–15)
BILIRUBIN TOTAL: 0.4 mg/dL (ref 0.3–1.2)
BUN: 12 mg/dL (ref 6–20)
CHLORIDE: 101 mmol/L (ref 101–111)
CO2: 24 mmol/L (ref 22–32)
CREATININE: 1.21 mg/dL — AB (ref 0.44–1.00)
Calcium: 9.9 mg/dL (ref 8.9–10.3)
GFR, EST AFRICAN AMERICAN: 44 mL/min — AB (ref >60)
GFR, EST NON AFRICAN AMERICAN: 38 mL/min — AB (ref >60)
Glucose, Bld: 216 mg/dL — ABNORMAL HIGH (ref 65–99)
POTASSIUM: 4 mmol/L (ref 3.5–5.1)
Sodium: 136 mmol/L (ref 135–145)
TOTAL PROTEIN: 6.7 g/dL (ref 6.5–8.1)

## 2016-03-14 LAB — LACTIC ACID, PLASMA: Lactic Acid, Venous: 1 mmol/L (ref 0.5–1.9)

## 2016-03-14 LAB — CBC WITH DIFFERENTIAL/PLATELET
Basophils Absolute: 0 10*3/uL (ref 0.0–0.1)
Basophils Relative: 0 %
EOS ABS: 0.1 10*3/uL (ref 0.0–0.7)
EOS PCT: 1 %
HCT: 35.3 % — ABNORMAL LOW (ref 36.0–46.0)
HEMOGLOBIN: 11.7 g/dL — AB (ref 12.0–15.0)
Lymphocytes Relative: 14 %
Lymphs Abs: 1.1 10*3/uL (ref 0.7–4.0)
MCH: 30.8 pg (ref 26.0–34.0)
MCHC: 33.1 g/dL (ref 30.0–36.0)
MCV: 92.9 fL (ref 78.0–100.0)
Monocytes Absolute: 0.6 10*3/uL (ref 0.1–1.0)
Monocytes Relative: 8 %
NEUTROS PCT: 77 %
Neutro Abs: 5.7 10*3/uL (ref 1.7–7.7)
PLATELETS: 153 10*3/uL (ref 150–400)
RBC: 3.8 MIL/uL — AB (ref 3.87–5.11)
RDW: 14.1 % (ref 11.5–15.5)
WBC: 7.4 10*3/uL (ref 4.0–10.5)

## 2016-03-14 LAB — INFLUENZA PANEL BY PCR (TYPE A & B)
INFLAPCR: POSITIVE — AB
INFLBPCR: NEGATIVE

## 2016-03-14 LAB — PROCALCITONIN: PROCALCITONIN: 0.22 ng/mL

## 2016-03-14 LAB — I-STAT TROPONIN, ED: TROPONIN I, POC: 0.01 ng/mL (ref 0.00–0.08)

## 2016-03-14 LAB — APTT: aPTT: 34 seconds (ref 24–36)

## 2016-03-14 LAB — PROTIME-INR
INR: 1.25
PROTHROMBIN TIME: 15.8 s — AB (ref 11.4–15.2)

## 2016-03-14 LAB — I-STAT CG4 LACTIC ACID, ED: Lactic Acid, Venous: 1.69 mmol/L (ref 0.5–1.9)

## 2016-03-14 LAB — CBG MONITORING, ED: Glucose-Capillary: 186 mg/dL — ABNORMAL HIGH (ref 65–99)

## 2016-03-14 MED ORDER — LEVALBUTEROL HCL 0.63 MG/3ML IN NEBU: 0.6300 mg | INHALATION_SOLUTION | RESPIRATORY_TRACT | Status: DC | PRN

## 2016-03-14 MED ORDER — ONDANSETRON HCL 4 MG/2ML IJ SOLN: 4.0000 mg | Freq: Four times a day (QID) | INTRAMUSCULAR | Status: DC | PRN

## 2016-03-14 MED ORDER — ALBUTEROL SULFATE (2.5 MG/3ML) 0.083% IN NEBU: 2.5000 mg | INHALATION_SOLUTION | Freq: Four times a day (QID) | RESPIRATORY_TRACT | Status: DC | PRN

## 2016-03-14 MED ORDER — SODIUM CHLORIDE 0.9 % IV BOLUS (SEPSIS)
500.0000 mL | Freq: Once | INTRAVENOUS | Status: AC
  Administered 2016-03-14: 500 mL via INTRAVENOUS

## 2016-03-14 MED ORDER — DEXTROSE 5 % IV SOLN
1.0000 g | INTRAVENOUS | Status: DC
  Administered 2016-03-15 – 2016-03-18 (×4): 1 g via INTRAVENOUS
  Filled 2016-03-14 (×5): qty 10

## 2016-03-14 MED ORDER — ONDANSETRON HCL 4 MG PO TABS: 4.0000 mg | ORAL_TABLET | Freq: Four times a day (QID) | ORAL | Status: DC | PRN

## 2016-03-14 MED ORDER — POLYETHYLENE GLYCOL 3350 17 G PO PACK
17.0000 g | PACK | Freq: Every day | ORAL | Status: DC
  Administered 2016-03-15 – 2016-03-19 (×5): 17 g via ORAL
  Filled 2016-03-14 (×5): qty 1

## 2016-03-14 MED ORDER — DULOXETINE HCL 60 MG PO CPEP
60.0000 mg | ORAL_CAPSULE | Freq: Every day | ORAL | Status: DC
  Administered 2016-03-15 – 2016-03-19 (×5): 60 mg via ORAL
  Filled 2016-03-14 (×6): qty 1

## 2016-03-14 MED ORDER — ACETAMINOPHEN 325 MG PO TABS
650.0000 mg | ORAL_TABLET | Freq: Once | ORAL | Status: AC
  Administered 2016-03-14: 650 mg via ORAL
  Filled 2016-03-14: qty 2

## 2016-03-14 MED ORDER — AZITHROMYCIN 250 MG PO TABS
500.0000 mg | ORAL_TABLET | Freq: Once | ORAL | Status: AC
  Administered 2016-03-14: 500 mg via ORAL
  Filled 2016-03-14: qty 2

## 2016-03-14 MED ORDER — DEXTROSE 5 % IV SOLN
1.0000 g | Freq: Once | INTRAVENOUS | Status: AC
  Administered 2016-03-14: 1 g via INTRAVENOUS
  Filled 2016-03-14: qty 10

## 2016-03-14 MED ORDER — CLONAZEPAM 0.5 MG PO TABS
0.2500 mg | ORAL_TABLET | Freq: Two times a day (BID) | ORAL | Status: DC | PRN
  Administered 2016-03-15 – 2016-03-16 (×2): 0.5 mg via ORAL
  Filled 2016-03-14 (×2): qty 1

## 2016-03-14 MED ORDER — OSELTAMIVIR PHOSPHATE 30 MG PO CAPS
30.0000 mg | ORAL_CAPSULE | Freq: Once | ORAL | Status: AC
  Administered 2016-03-14: 30 mg via ORAL
  Filled 2016-03-14 (×2): qty 1

## 2016-03-14 MED ORDER — MAGNESIUM OXIDE 400 (241.3 MG) MG PO TABS
400.0000 mg | ORAL_TABLET | Freq: Every day | ORAL | Status: DC
  Administered 2016-03-15 – 2016-03-19 (×5): 400 mg via ORAL
  Filled 2016-03-14 (×6): qty 1

## 2016-03-14 MED ORDER — ASPIRIN EC 81 MG PO TBEC
81.0000 mg | DELAYED_RELEASE_TABLET | Freq: Every day | ORAL | Status: DC
  Administered 2016-03-15 – 2016-03-19 (×5): 81 mg via ORAL
  Filled 2016-03-14 (×5): qty 1

## 2016-03-14 MED ORDER — NIFEDIPINE ER OSMOTIC RELEASE 30 MG PO TB24
30.0000 mg | ORAL_TABLET | Freq: Every day | ORAL | Status: DC
  Administered 2016-03-15 – 2016-03-19 (×5): 30 mg via ORAL
  Filled 2016-03-14 (×7): qty 1

## 2016-03-14 MED ORDER — MIRTAZAPINE 30 MG PO TABS
30.0000 mg | ORAL_TABLET | Freq: Every day | ORAL | Status: DC
Start: 2016-03-15 — End: 2016-03-19
  Administered 2016-03-15 – 2016-03-18 (×4): 30 mg via ORAL
  Filled 2016-03-14 (×5): qty 1

## 2016-03-14 MED ORDER — ACETAMINOPHEN 650 MG RE SUPP: 650.0000 mg | Freq: Four times a day (QID) | RECTAL | Status: DC | PRN

## 2016-03-14 MED ORDER — GUAIFENESIN ER 600 MG PO TB12
600.0000 mg | ORAL_TABLET | Freq: Two times a day (BID) | ORAL | Status: DC
  Administered 2016-03-15 – 2016-03-19 (×9): 600 mg via ORAL
  Filled 2016-03-14 (×10): qty 1

## 2016-03-14 MED ORDER — RISAQUAD PO CAPS
1.0000 | ORAL_CAPSULE | Freq: Every day | ORAL | Status: DC
  Administered 2016-03-15 – 2016-03-19 (×5): 1 via ORAL
  Filled 2016-03-14 (×6): qty 1

## 2016-03-14 MED ORDER — POTASSIUM CHLORIDE CRYS ER 20 MEQ PO TBCR
20.0000 meq | EXTENDED_RELEASE_TABLET | Freq: Every day | ORAL | Status: DC
  Administered 2016-03-15 – 2016-03-19 (×5): 20 meq via ORAL
  Filled 2016-03-14 (×5): qty 1

## 2016-03-14 MED ORDER — ACETAMINOPHEN 325 MG PO TABS
650.0000 mg | ORAL_TABLET | Freq: Four times a day (QID) | ORAL | Status: DC | PRN
  Administered 2016-03-16: 650 mg via ORAL
  Administered 2016-03-17: 325 mg via ORAL
  Filled 2016-03-14 (×2): qty 2

## 2016-03-14 MED ORDER — AZITHROMYCIN 500 MG PO TABS
250.0000 mg | ORAL_TABLET | Freq: Every day | ORAL | Status: AC
  Administered 2016-03-15 – 2016-03-18 (×4): 250 mg via ORAL
  Filled 2016-03-14 (×4): qty 1

## 2016-03-14 MED ORDER — MEGESTROL ACETATE 20 MG PO TABS
20.0000 mg | ORAL_TABLET | Freq: Every day | ORAL | Status: DC
  Administered 2016-03-15 – 2016-03-19 (×5): 20 mg via ORAL
  Filled 2016-03-14 (×7): qty 1

## 2016-03-14 MED ORDER — BISMUTH SUBSALICYLATE 262 MG/15ML PO SUSP: 30.0000 mL | Freq: Four times a day (QID) | ORAL | Status: DC | PRN

## 2016-03-14 MED ORDER — INSULIN ASPART 100 UNIT/ML ~~LOC~~ SOLN
0.0000 [IU] | SUBCUTANEOUS | Status: DC
  Administered 2016-03-14: 2 [IU] via SUBCUTANEOUS
  Administered 2016-03-15: 1 [IU] via SUBCUTANEOUS
  Administered 2016-03-15 – 2016-03-16 (×4): 2 [IU] via SUBCUTANEOUS
  Administered 2016-03-16 (×3): 1 [IU] via SUBCUTANEOUS
  Administered 2016-03-16: 3 [IU] via SUBCUTANEOUS
  Administered 2016-03-16: 7 [IU] via SUBCUTANEOUS
  Administered 2016-03-17 (×2): 3 [IU] via SUBCUTANEOUS
  Administered 2016-03-17 (×3): 1 [IU] via SUBCUTANEOUS
  Administered 2016-03-17 – 2016-03-18 (×3): 2 [IU] via SUBCUTANEOUS
  Administered 2016-03-18: 3 [IU] via SUBCUTANEOUS
  Administered 2016-03-18: 2 [IU] via SUBCUTANEOUS
  Administered 2016-03-18: 1 [IU] via SUBCUTANEOUS
  Administered 2016-03-19: 2 [IU] via SUBCUTANEOUS
  Administered 2016-03-19 (×2): 1 [IU] via SUBCUTANEOUS
  Administered 2016-03-19: 2 [IU] via SUBCUTANEOUS
  Filled 2016-03-14 (×3): qty 1

## 2016-03-14 MED ORDER — PANTOPRAZOLE SODIUM 40 MG PO TBEC
40.0000 mg | DELAYED_RELEASE_TABLET | Freq: Every day | ORAL | Status: DC
  Administered 2016-03-15 – 2016-03-19 (×5): 40 mg via ORAL
  Filled 2016-03-14 (×5): qty 1

## 2016-03-14 MED ORDER — TORSEMIDE 5 MG PO TABS
5.0000 mg | ORAL_TABLET | Freq: Every day | ORAL | Status: DC
Start: 2016-03-15 — End: 2016-03-19
  Administered 2016-03-15 – 2016-03-19 (×5): 5 mg via ORAL
  Filled 2016-03-14 (×7): qty 1

## 2016-03-14 MED ORDER — LIDOCAINE 5 % EX PTCH
1.0000 | MEDICATED_PATCH | Freq: Every day | CUTANEOUS | Status: DC | PRN
  Filled 2016-03-14: qty 1

## 2016-03-14 MED ORDER — SIMVASTATIN 40 MG PO TABS
40.0000 mg | ORAL_TABLET | Freq: Every evening | ORAL | Status: DC
  Administered 2016-03-15 – 2016-03-18 (×4): 40 mg via ORAL
  Filled 2016-03-14 (×5): qty 1

## 2016-03-14 MED ORDER — VITAMIN D 1000 UNITS PO TABS
1000.0000 [IU] | ORAL_TABLET | Freq: Every day | ORAL | Status: DC
  Administered 2016-03-15 – 2016-03-19 (×5): 1000 [IU] via ORAL
  Filled 2016-03-14 (×5): qty 1

## 2016-03-14 MED ORDER — OSELTAMIVIR PHOSPHATE 30 MG PO CAPS
30.0000 mg | ORAL_CAPSULE | Freq: Two times a day (BID) | ORAL | Status: DC
  Administered 2016-03-15 – 2016-03-19 (×9): 30 mg via ORAL
  Filled 2016-03-14 (×9): qty 1

## 2016-03-14 NOTE — Consult Note (Signed)
Dr Madilyn Hookees called for consult in regards to patient Elizabeth Keller Spikes Reports patient is septic with pneumonia and possible influenza Has small acute hemorrhage. Pleasantly confused. Slight decrease in strength BLE. Suspect related to sepsis vs. Hemorrhage.  She is being admitted for sepsis. Would like recs for monitoring hemorrhage. Rec neuro checks to ensure no worsening at least q 4 hours Can repeat CT scan in the am to ensure no worsening Dr Madilyn Hookees will call if she needs anything further.

## 2016-03-14 NOTE — ED Notes (Signed)
Clicked off in error by De NurseKaren Revis Whalin at 17:32 order complete and document v/s.

## 2016-03-14 NOTE — ED Triage Notes (Signed)
[facility] today and altered LOC. Pt is usually ambulatory but family states she has fallen 5 times today and has periodic changes in orientation.

## 2016-03-14 NOTE — Progress Notes (Addendum)
Pharmacy Antibiotic Note  Elizabeth Keller is a 83 y.o. female admitted on 03/14/2016 with sepsis, pneumonia and possible influenza. Pharmacy has been consulted for Ceftriaxone and Azithromycin dosing CAP.  1st doses of ceftriaxone and oral azithromycin already given tonight in ED.   Plan: Ceftriaxone 1 g IV q24h Azithromycin 250 mg po daily x 4 days Pharmacy will sign off- no adjustments required if renal function changes.    Height: 5\' 7"  (170.2 cm) Weight: 150 lb (68 kg) IBW/kg (Calculated) : 61.6  Temp (24hrs), Avg:101.8 F (38.8 C), Min:101.8 F (38.8 C), Max:101.8 F (38.8 C)   Recent Labs Lab 03/14/16 1716 03/14/16 1729  WBC 7.4  --   CREATININE 1.21*  --   LATICACIDVEN  --  1.69    Estimated Creatinine Clearance: 30.1 mL/min (by C-G formula based on SCr of 1.21 mg/dL (H)).    Allergies  Allergen Reactions  . Dicyclomine Other (See Comments)    dizziness  . Gabapentin Other (See Comments)    dizziness  . Metformin And Related Nausea And Vomiting  . Lyrica [Pregabalin] Other (See Comments)  . Topiramate Other (See Comments)    Thank you for allowing pharmacy to be a part of this patient's care.  Noah Delaineuth Eilish Mcdaniel, RPh Clinical Pharmacist Pager: [phone] 03/14/2016 8:44 PM

## 2016-03-14 NOTE — ED Provider Notes (Signed)
[facility]-EMERGENCY DEPT Provider Note   CSN: [id number] Arrival date & time: 03/14/16  1552     History   Chief Complaint Chief Complaint  Patient presents with  . Altered Mental Status  . Fall    HPI Elizabeth Keller is a 83 y.o. female.  The history is provided by the EMS personnel and a relative. No language interpreter was used.   Elizabeth Keller is a 82 y.o. female who presents to the Emergency Department complaining of confusion, frequent falls.  Level  V caveat due to confusion.  Hx is provided by EMS and the patient's daughter.  Reports she has been confused since this morning with waxing and waning mental status. She is typically alert and oriented 4 and ambulates at home with a walker. Today she has been intermittently confused and she has had 5 falls. No known head injuries. They note that she drags her legs and shuffles when she walks. She has had decreased appetite. She has chronic intermittent abdominal pain, unchanged from baseline. No reports of fevers, cough, vomiting. Past Medical History:  Diagnosis Date  . Degenerative disk disease   . Diabetes mellitus   . GERD (gastroesophageal reflux disease)   . Hypercholesteremia   . Hypertension   . IBS (irritable bowel syndrome)   . Sarcoidosis (HCC)   . Spinal stenosis     Patient Active Problem List   Diagnosis Date Noted  . Sepsis (HCC) 03/14/2016  . Influenza A 03/14/2016  . CAP (community acquired pneumonia) 03/14/2016  . ICH (intracerebral hemorrhage) (HCC) 03/14/2016  . Diabetes mellitus type II, controlled (HCC) 03/14/2016  . Chronic kidney disease, stage III (moderate) 03/14/2016  . Anemia 03/14/2016  . Hypertension 03/14/2016    Past Surgical History:  Procedure Laterality Date  . EYE SURGERY     cataract    OB History    No data available       Home Medications    Prior to Admission medications   Medication Sig Start Date End Date Taking? Authorizing Provider  albuterol (PROVENTIL HFA;VENTOLIN  HFA) 108 (90 Base) MCG/ACT inhaler Inhale into the lungs every 6 (six) hours as needed for wheezing or shortness of breath.   Yes Historical Provider, MD  albuterol (PROVENTIL) (2.5 MG/3ML) 0.083% nebulizer solution Take 2.5 mg by nebulization every 6 (six) hours as needed for wheezing or shortness of breath.   Yes Historical Provider, MD  aspirin 81 MG tablet Take 81 mg by mouth daily.   Yes Historical Provider, MD  bismuth subsalicylate (PEPTO BISMOL) 262 MG/15ML suspension Take 30 mLs by mouth every 6 (six) hours as needed for indigestion.    Yes Historical Provider, MD  Calcium & Magnesium Carbonates (MYLANTA PO) Take by mouth as needed.   Yes Historical Provider, MD  calcium carbonate (TUMS - DOSED IN MG ELEMENTAL CALCIUM) 500 MG chewable tablet Chew 1 tablet by mouth daily as needed for indigestion or heartburn.   Yes Historical Provider, MD  cholecalciferol (VITAMIN D) 1000 units tablet Take 1,000 Units by mouth daily.   Yes Historical Provider, MD  clonazePAM (KLONOPIN) 0.5 MG tablet Take 0.25-0.5 mg by mouth 2 (two) times daily as needed. 1/2 tab q am; whole tab at 2pm; may take 1/2 tab between morning and 2pm dose, then delay 2pm dose till 7pm For anxiety   Yes Historical Provider, MD  diclofenac sodium (VOLTAREN) 1 % GEL Apply 2 g topically as needed.   Yes Historical Provider, MD  DULoxetine HCl (CYMBALTA PO) Take 60  mg by mouth daily.    Yes Historical Provider, MD  glipiZIDE (GLUCOTROL) 10 MG tablet Take 5-10 mg by mouth daily before breakfast. Takes 1 tab in am and 1/2 tab in pm   Yes Historical Provider, MD  Lactobacillus (PROBIOTIC ACIDOPHILUS PO) Take 1 tablet by mouth daily.   Yes Historical Provider, MD  lidocaine (LIDODERM) 5 % Place 1 patch onto the skin daily as needed for pain. 12/13/13  Yes Historical Provider, MD  Magnesium 400 MG TABS Take 400 mg by mouth daily.    Yes Historical Provider, MD  megestrol (MEGACE) 20 MG tablet Take 20 mg by mouth daily.    Yes Historical  Provider, MD  metFORMIN (GLUCOPHAGE) 500 MG tablet Take 500 mg by mouth daily with breakfast.    Yes Historical Provider, MD  mirtazapine (REMERON) 30 MG tablet Take 30 mg by mouth at bedtime.   Yes Historical Provider, MD  NIFEdipine (PROCARDIA-XL/ADALAT CC) 30 MG 24 hr tablet Take 30 mg by mouth daily.   Yes Historical Provider, MD  omeprazole (PRILOSEC) 20 MG capsule Take 40 mg by mouth daily.    Yes Historical Provider, MD  polyethylene glycol (MIRALAX / GLYCOLAX) packet Take 17 g by mouth daily.   Yes Historical Provider, MD  potassium chloride SA (K-DUR,KLOR-CON) 20 MEQ tablet Take 20 mEq by mouth daily.   Yes Historical Provider, MD  Probiotic Product (PROBIOTIC FORMULA) CAPS Take 1 capsule by mouth daily.   Yes Historical Provider, MD  simvastatin (ZOCOR) 40 MG tablet Take 40 mg by mouth every evening.   Yes Historical Provider, MD  torsemide (DEMADEX) 5 MG tablet Take 5 mg by mouth daily.   Yes Historical Provider, MD    Family History No family history on file.  Social History Social History  Substance Use Topics  . Smoking status: Former Games developer  . Smokeless tobacco: Never Used  . Alcohol use No     Allergies   Dicyclomine; Gabapentin; Metformin and related; Lyrica [pregabalin]; and Topiramate   Review of Systems Review of Systems  Unable to perform ROS: Mental status change     Physical Exam Updated Vital Signs BP 128/64   Pulse 100   Temp 101.8 F (38.8 C) (Rectal)   Resp 25   Ht 5\' 7"  (1.702 m)   Wt 150 lb (68 kg)   SpO2 96%   BMI 23.49 kg/m   Physical Exam  Constitutional: She appears well-developed and well-nourished.  HENT:  Head: Normocephalic and atraumatic.  Cardiovascular: Regular rhythm.   No murmur heard. Tachycardic  Pulmonary/Chest: Effort normal and breath sounds normal. No respiratory distress.  Abdominal: Soft. There is no tenderness. There is no rebound and no guarding.  Musculoskeletal: She exhibits no tenderness.  2+ pitting edema  to bilateral lower extremities  Neurological: She is alert.  Disoriented to place and time. 3 out of 5 strength in bilateral lower extremities. 5 out of 5 strength in bilateral upper extremities. Pleasantly confused.  Skin: Skin is warm and dry.  Psychiatric: She has a normal mood and affect. Her behavior is normal.  Nursing note and vitals reviewed.    ED Treatments / Results  Labs (all labs ordered are listed, but only abnormal results are displayed) Labs Reviewed  COMPREHENSIVE METABOLIC PANEL - Abnormal; Notable for the following:       Result Value   Glucose, Bld 216 (*)    Creatinine, Ser 1.21 (*)    AST 69 (*)    GFR calc non  Af Amer 38 (*)    GFR calc Af Amer 44 (*)    All other components within normal limits  CBC WITH DIFFERENTIAL/PLATELET - Abnormal; Notable for the following:    RBC 3.80 (*)    Hemoglobin 11.7 (*)    HCT 35.3 (*)    All other components within normal limits  INFLUENZA PANEL BY PCR (TYPE A & B) - Abnormal; Notable for the following:    Influenza A By PCR POSITIVE (*)    All other components within normal limits  PROTIME-INR - Abnormal; Notable for the following:    Prothrombin Time 15.8 (*)    All other components within normal limits  BRAIN NATRIURETIC PEPTIDE - Abnormal; Notable for the following:    B Natriuretic Peptide 197.2 (*)    All other components within normal limits  CBG MONITORING, ED - Abnormal; Notable for the following:    Glucose-Capillary 186 (*)    All other components within normal limits  CULTURE, BLOOD (ROUTINE X 2)  CULTURE, BLOOD (ROUTINE X 2)  URINE CULTURE  CULTURE, EXPECTORATED SPUTUM-ASSESSMENT  LACTIC ACID, PLASMA  PROCALCITONIN  APTT  URINALYSIS, ROUTINE W REFLEX MICROSCOPIC  LACTIC ACID, PLASMA  CBC  BASIC METABOLIC PANEL  STREP PNEUMONIAE URINARY ANTIGEN  LEGIONELLA PNEUMOPHILA SEROGP 1 UR AG  TROPONIN I  TROPONIN I  I-STAT TROPOININ, ED  I-STAT CG4 LACTIC ACID, ED  I-STAT CG4 LACTIC ACID, ED     EKG  EKG Interpretation  Date/Time:  Saturday March 14 2016 16:27:11 EST Ventricular Rate:  109 PR Interval:    QRS Duration: 155 QT Interval:  372 QTC Calculation: 501 R Axis:   -79 Text Interpretation:  undetermined rhythmRBBB and LAFB Inferior infarct, acute Artifact in lead(s) I II III aVR aVL aVF V1 V2 V3 V4 V5 V6 significant artifact limiting interpretation Confirmed by Lincoln Brigham [phone]) on 03/14/2016 4:46:44 PM       Radiology Ct Head Wo Contrast  Result Date: 03/14/2016 CLINICAL DATA:  Multiple falls today, altered level of consciousness. EXAM: CT HEAD WITHOUT CONTRAST TECHNIQUE: Contiguous axial images were obtained from the base of the skull through the vertex without intravenous contrast. COMPARISON:  Head CT dated 01/29/2014. FINDINGS: Brain: There is generalized age related parenchymal atrophy with commensurate dilatation of the ventricles and sulci. Chronic small vessel ischemic changes noted within the bilateral periventricular and subcortical white matter regions. Small amount of acute hemorrhage is seen within the body of the left lateral ventricle and layering within the posterior horn of the left lateral ventricle. No parenchymal or extra-axial hemorrhage. Vascular: There are chronic calcified atherosclerotic changes of the large vessels at the skull base. No unexpected hyperdense vessel. Skull: Normal. Negative for fracture or focal lesion. Sinuses/Orbits: No acute finding. Other: Scalp hematoma overlying the right occipital bone, measuring approximately 1.6 cm thickness. No underlying fracture. IMPRESSION: 1. Small amount of acute hemorrhage within the left lateral ventricle. 2. No parenchymal or extra-axial hemorrhage. No evidence of parenchymal edema or mass effect. 3. Scalp hematoma overlying the right occipital bone, measuring approximately 1.6 cm thickness. No underlying fracture. 4. Atrophy and chronic ischemic changes in the white matter. Critical Value/emergent  results were called by telephone at the time of interpretation on 03/14/2016 at 6:45 pm to Dr. Tilden Fossa , who verbally acknowledged these results. Needs Electronically Signed   By: Bary Richard M.D.   On: 03/14/2016 18:49   Dg Chest Port 1 View  Result Date: 03/14/2016 CLINICAL DATA:  Altered mental status  EXAM: PORTABLE CHEST 1 VIEW COMPARISON:  12/19/2015 chest radiograph. FINDINGS: Stable cardiomediastinal silhouette with top-normal heart size and aortic atherosclerosis. No pneumothorax. No pleural effusion. There is chronic severe bronchiectasis and volume loss in the apical upper lobes, right greater than left. Patchy opacity in the peripheral right mid lung appears increased. Stable mild scarring at the left lung base. IMPRESSION: Patchy opacity in the peripheral right mid lung appears increased, cannot exclude a pneumonia. This finding is superimposed on chronic severe bronchiectasis and volume loss in the apical upper lobes. Recommend follow-up PA and lateral post treatment chest radiographs in 4-6 weeks. Aortic atherosclerosis. Electronically Signed   By: Delbert PhenixJason A Poff M.D.   On: 03/14/2016 17:28    Procedures Procedures (including critical care time) CRITICAL CARE Performed by: Tilden FossaElizabeth Bertha Earwood   Total critical care time: 30 minutes  Critical care time was exclusive of separately billable procedures and treating other patients.  Critical care was necessary to treat or prevent imminent or life-threatening deterioration.  Critical care was time spent personally by me on the following activities: development of treatment plan with patient and/or surrogate as well as nursing, discussions with consultants, evaluation of patient's response to treatment, examination of patient, obtaining history from patient or surrogate, ordering and performing treatments and interventions, ordering and review of laboratory studies, ordering and review of radiographic studies, pulse oximetry and re-evaluation  of patient's condition.   Medications Ordered in ED Medications  ondansetron (ZOFRAN) tablet 4 mg (not administered)    Or  ondansetron (ZOFRAN) injection 4 mg (not administered)  acetaminophen (TYLENOL) tablet 650 mg (not administered)    Or  acetaminophen (TYLENOL) suppository 650 mg (not administered)  levalbuterol (XOPENEX) nebulizer solution 0.63 mg (not administered)  insulin aspart (novoLOG) injection 0-9 Units (0 Units Subcutaneous Not Given 03/14/16 2336)  azithromycin (ZITHROMAX) tablet 250 mg (not administered)  cefTRIAXone (ROCEPHIN) 1 g in dextrose 5 % 50 mL IVPB (not administered)  guaiFENesin (MUCINEX) 12 hr tablet 600 mg (not administered)  oseltamivir (TAMIFLU) capsule 30 mg (not administered)  bismuth subsalicylate (PEPTO BISMOL) 262 MG/15ML suspension 30 mL (not administered)  lidocaine (LIDODERM) 5 % 1 patch (not administered)  torsemide (DEMADEX) tablet 5 mg (not administered)  magnesium oxide (MAG-OX) tablet 400 mg (not administered)  polyethylene glycol (MIRALAX / GLYCOLAX) packet 17 g (not administered)  potassium chloride SA (K-DUR,KLOR-CON) CR tablet 20 mEq (not administered)  DULoxetine (CYMBALTA) DR capsule 60 mg (not administered)  aspirin EC tablet 81 mg (not administered)  megestrol (MEGACE) tablet 20 mg (not administered)  mirtazapine (REMERON) tablet 30 mg (not administered)  cholecalciferol (VITAMIN D) tablet 1,000 Units (not administered)  acidophilus (RISAQUAD) capsule 1 capsule (not administered)  clonazePAM (KLONOPIN) tablet 0.25-0.5 mg (not administered)  NIFEdipine (PROCARDIA-XL/ADALAT CC) 24 hr tablet 30 mg (not administered)  simvastatin (ZOCOR) tablet 40 mg (not administered)  pantoprazole (PROTONIX) EC tablet 40 mg (not administered)  sodium chloride 0.9 % bolus 500 mL (0 mLs Intravenous Stopped 03/14/16 2119)  acetaminophen (TYLENOL) tablet 650 mg (650 mg Oral Given 03/14/16 1711)  cefTRIAXone (ROCEPHIN) 1 g in dextrose 5 % 50 mL IVPB (0 g  Intravenous Stopped 03/14/16 2119)  azithromycin (ZITHROMAX) tablet 500 mg (500 mg Oral Given 03/14/16 1739)  oseltamivir (TAMIFLU) capsule 30 mg (30 mg Oral Given 03/14/16 2208)     Initial Impression / Assessment and Plan / ED Course  I have reviewed the triage vital signs and the nursing notes.  Pertinent labs & imaging results that were available during my  care of the patient were reviewed by me and considered in my medical decision making (see chart for details).     Pt from home following one day of frequent falls and confusion.  She is pleasantly confused in the ED, tachypneic and tachycardic.  CXR concerning for pna - she was treated with abx for CAP and started on tamiflu for possible influenza.  CT head with small IVH.  D/w Neurosurgery - recommendation for repeat head CT in am.  Hospitalist consulted for admission for further treatment.  Patient and family updated of findings of studies and recommendation for admission and they are in agreement with plan.    Final Clinical Impressions(s) / ED Diagnoses   Final diagnoses:  ICH (intracerebral hemorrhage) (HCC)    New Prescriptions New Prescriptions   No medications on file     Tilden Fossa, MD 03/15/16 1203

## 2016-03-14 NOTE — ED Notes (Signed)
Patient transported to CT 

## 2016-03-14 NOTE — H&P (Signed)
History and Physical    Elizabeth Keller QZE:[id number] DOB: [birth year] DOA: 03/14/2016  Referring MD/NP/PA:  Dr. Ralene Bathe PCP: Reeves Dam, MD  Patient coming from: Home via EMS  Chief Complaint: Falls  HPI: Elizabeth Keller is a 83 y.o. female with medical history significant of HTN, HLD, DM type II, CKD stage III and sarcoidosis; who presents from home with complaints of multiple falls and being acutely altered. At baseline the patient is noted to be ambulatory with walker and of sound mind. Patient is noted have somewhat of a shuffling gait. Sometime around 1 PM this afternoon she had 5 consecutive falls while trying to get up. Family noted that she was somewhat confused and her mental status, seemed to wax and wane. Other associated symptoms include chronic intermittent abdominal pain, constipation, leg swelling, decreased appetite, and productive cough(new). Patient had been taking over-the-counter cough medicine. Family denies knowledge of any fever, vomiting, chills, diarrhea, chest pain, or shortness of breath complaints.   ED Course: Upon admission into the emergency department patient was seen to be febrile up to 101.96F, heart rates 105-109, respirations 21-36, blood pressure maintained, O2 saturations is 90-96% on room air. Lab work revealed WBC 7.4, hemoglobin 11.7, platelets 153, BUN 12 creatinine 1.21, glucose 216, lactic acid 1.69, troponin 0.01. Glucose sepsis was initiated and the patient received 500 mL of normal saline, azithromycin, Rocephin, and Tamiflu. CT scan of the brain revealed a small left lateral ventricle hemorrhage. Neurosurgery was consulted and recommended neuro checks and repeat CT scan in a.m. TRH called to admit.  Review of Systems: As per HPI otherwise 10 point review of systems negative.   Past Medical History:  Diagnosis Date  . Degenerative disk disease   . Diabetes mellitus   . GERD (gastroesophageal reflux disease)   . Hypercholesteremia   . Hypertension     . IBS (irritable bowel syndrome)   . Sarcoidosis ([place])   . Spinal stenosis     Past Surgical History:  Procedure Laterality Date  . EYE SURGERY     cataract     reports that she has quit smoking. She has never used smokeless tobacco. She reports that she does not drink alcohol or use drugs.  Allergies  Allergen Reactions  . Dicyclomine Other (See Comments)    dizziness  . Gabapentin Other (See Comments)    dizziness  . Metformin And Related Nausea And Vomiting  . Lyrica [Pregabalin] Other (See Comments)  . Topiramate Other (See Comments)    No family history on file.  Prior to Admission medications   Medication Sig Start Date End Date Taking? Authorizing Provider  albuterol (PROVENTIL HFA;VENTOLIN HFA) 108 (90 Base) MCG/ACT inhaler Inhale into the lungs every 6 (six) hours as needed for wheezing or shortness of breath.   Yes Historical Provider, MD  albuterol (PROVENTIL) (2.5 MG/3ML) 0.083% nebulizer solution Take 2.5 mg by nebulization every 6 (six) hours as needed for wheezing or shortness of breath.   Yes Historical Provider, MD  aspirin 81 MG tablet Take 81 mg by mouth daily.   Yes Historical Provider, MD  bismuth subsalicylate (PEPTO BISMOL) 262 MG/15ML suspension Take 30 mLs by mouth every 6 (six) hours as needed for indigestion.    Yes Historical Provider, MD  Calcium & Magnesium Carbonates (MYLANTA PO) Take by mouth as needed.   Yes Historical Provider, MD  calcium carbonate (TUMS - DOSED IN MG ELEMENTAL CALCIUM) 500 MG chewable tablet Chew 1 tablet by mouth daily as needed for indigestion  or heartburn.   Yes Historical Provider, MD  cholecalciferol (VITAMIN D) 1000 units tablet Take 1,000 Units by mouth daily.   Yes Historical Provider, MD  clonazePAM (KLONOPIN) 0.5 MG tablet Take 0.25-0.5 mg by mouth 2 (two) times daily as needed. 1/2 tab q am; whole tab at 2pm; may take 1/2 tab between morning and 2pm dose, then delay 2pm dose till 7pm For anxiety   Yes Historical  Provider, MD  diclofenac sodium (VOLTAREN) 1 % GEL Apply 2 g topically as needed.   Yes Historical Provider, MD  DULoxetine HCl (CYMBALTA PO) Take 60 mg by mouth daily.    Yes Historical Provider, MD  glipiZIDE (GLUCOTROL) 10 MG tablet Take 5-10 mg by mouth daily before breakfast. Takes 1 tab in am and 1/2 tab in pm   Yes Historical Provider, MD  Lactobacillus (PROBIOTIC ACIDOPHILUS PO) Take 1 tablet by mouth daily.   Yes Historical Provider, MD  lidocaine (LIDODERM) 5 % Place 1 patch onto the skin daily as needed for pain. 12/13/13  Yes Historical Provider, MD  Magnesium 400 MG TABS Take 400 mg by mouth daily.    Yes Historical Provider, MD  megestrol (MEGACE) 20 MG tablet Take 20 mg by mouth daily.    Yes Historical Provider, MD  metFORMIN (GLUCOPHAGE) 500 MG tablet Take 500 mg by mouth daily with breakfast.    Yes Historical Provider, MD  mirtazapine (REMERON) 30 MG tablet Take 30 mg by mouth at bedtime.   Yes Historical Provider, MD  NIFEdipine (PROCARDIA-XL/ADALAT CC) 30 MG 24 hr tablet Take 30 mg by mouth daily.   Yes Historical Provider, MD  omeprazole (PRILOSEC) 20 MG capsule Take 40 mg by mouth daily.    Yes Historical Provider, MD  polyethylene glycol (MIRALAX / GLYCOLAX) packet Take 17 g by mouth daily.   Yes Historical Provider, MD  potassium chloride SA (K-DUR,KLOR-CON) 20 MEQ tablet Take 20 mEq by mouth daily.   Yes Historical Provider, MD  Probiotic Product (PROBIOTIC FORMULA) CAPS Take 1 capsule by mouth daily.   Yes Historical Provider, MD  simvastatin (ZOCOR) 40 MG tablet Take 40 mg by mouth every evening.   Yes Historical Provider, MD  torsemide (DEMADEX) 5 MG tablet Take 5 mg by mouth daily.   Yes Historical Provider, MD    Physical Exam:    Constitutional: Elderly female who is alert and pleasantly confused. Vitals:   03/14/16 1730 03/14/16 1745 03/14/16 1800 03/14/16 1845  BP: 141/79 137/98 131/73   Pulse: 109 109 105 108  Resp: 21 25 (!) 32 (!) 28  Temp:        TempSrc:      SpO2: 94% 94% 94% 94%  Weight:      Height:       Eyes: PERRL, lids and conjunctivae normal ENMT: Mucous membranes are moist. Posterior pharynx clear of any exudate or lesions.  Neck: normal, supple, no masses, no thyromegaly Respiratory: Tachypneic with decreased overall aeration positive crackles. Patient able to talk in complete sentences.  Cardiovascular: Tachycardic, positive SEM murmur. No rubs / gallops. +2 pitting lower extremity edema. 2+ pedal pulses. No carotid bruits.  Abdomen: no tenderness, no masses palpated. No hepatosplenomegaly. Bowel sounds positive.  Musculoskeletal: no clubbing / cyanosis. No joint deformity upper and lower extremities. Good ROM, no contractures. Normal muscle tone.  Skin:bruise on the left forehead, arm, and leg. Abrasion noted of the left ankle. Neurologic: CN 2-12 grossly intact. Sensation intact, DTR normal. Strength 4/5 in the bilateral lower  extremities and 4+ to 5/5 in the upper extremities.  Psychiatric: Confused Alert and oriented x 1. Normal mood.     Labs on Admission: I have personally reviewed following labs and imaging studies  CBC:  Recent Labs Lab 03/14/16 1716  WBC 7.4  NEUTROABS 5.7  HGB 11.7*  HCT 35.3*  MCV 92.9  PLT 378   Basic Metabolic Panel:  Recent Labs Lab 03/14/16 1716  NA 136  K 4.0  CL 101  CO2 24  GLUCOSE 216*  BUN 12  CREATININE 1.21*  CALCIUM 9.9   GFR: Estimated Creatinine Clearance: 30.1 mL/min (by C-G formula based on SCr of 1.21 mg/dL (H)). Liver Function Tests:  Recent Labs Lab 03/14/16 1716  AST 69*  ALT 52  ALKPHOS 99  BILITOT 0.4  PROT 6.7  ALBUMIN 3.8   No results for input(s): LIPASE, AMYLASE in the last 168 hours. No results for input(s): AMMONIA in the last 168 hours. Coagulation Profile: No results for input(s): INR, PROTIME in the last 168 hours. Cardiac Enzymes: No results for input(s): CKTOTAL, CKMB, CKMBINDEX, TROPONINI in the last 168 hours. BNP  (last 3 results) No results for input(s): PROBNP in the last 8760 hours. HbA1C: No results for input(s): HGBA1C in the last 72 hours. CBG: No results for input(s): GLUCAP in the last 168 hours. Lipid Profile: No results for input(s): CHOL, HDL, LDLCALC, TRIG, CHOLHDL, LDLDIRECT in the last 72 hours. Thyroid Function Tests: No results for input(s): TSH, T4TOTAL, FREET4, T3FREE, THYROIDAB in the last 72 hours. Anemia Panel: No results for input(s): VITAMINB12, FOLATE, FERRITIN, TIBC, IRON, RETICCTPCT in the last 72 hours. Urine analysis:    Component Value Date/Time   COLORURINE YELLOW 12/19/2015 [place] 12/19/2015 1442   LABSPEC 1.007 12/19/2015 1442   PHURINE 7.5 12/19/2015 1442   GLUCOSEU NEGATIVE 12/19/2015 1442   HGBUR NEGATIVE 12/19/2015 1442   BILIRUBINUR NEGATIVE 12/19/2015 1442   KETONESUR NEGATIVE 12/19/2015 1442   PROTEINUR NEGATIVE 12/19/2015 1442   UROBILINOGEN 0.2 10/18/2014 2310   NITRITE NEGATIVE 12/19/2015 1442   LEUKOCYTESUR NEGATIVE 12/19/2015 1442   Sepsis Labs: No results found for this or any previous visit (from the past 240 hour(s)).   Radiological Exams on Admission: Ct Head Wo Contrast  Result Date: 03/14/2016 CLINICAL DATA:  Multiple falls today, altered level of consciousness. EXAM: CT HEAD WITHOUT CONTRAST TECHNIQUE: Contiguous axial images were obtained from the base of the skull through the vertex without intravenous contrast. COMPARISON:  Head CT dated 01/29/2014. FINDINGS: Brain: There is generalized age related parenchymal atrophy with commensurate dilatation of the ventricles and sulci. Chronic small vessel ischemic changes noted within the bilateral periventricular and subcortical white matter regions. Small amount of acute hemorrhage is seen within the body of the left lateral ventricle and layering within the posterior horn of the left lateral ventricle. No parenchymal or extra-axial hemorrhage. Vascular: There are chronic  calcified atherosclerotic changes of the large vessels at the skull base. No unexpected hyperdense vessel. Skull: Normal. Negative for fracture or focal lesion. Sinuses/Orbits: No acute finding. Other: Scalp hematoma overlying the right occipital bone, measuring approximately 1.6 cm thickness. No underlying fracture. IMPRESSION: 1. Small amount of acute hemorrhage within the left lateral ventricle. 2. No parenchymal or extra-axial hemorrhage. No evidence of parenchymal edema or mass effect. 3. Scalp hematoma overlying the right occipital bone, measuring approximately 1.6 cm thickness. No underlying fracture. 4. Atrophy and chronic ischemic changes in the white matter. Critical Value/emergent results were called by  telephone at the time of interpretation on 03/14/2016 at 6:45 pm to Dr. Quintella Reichert , who verbally acknowledged these results. Needs Electronically Signed   By: Franki Cabot M.D.   On: 03/14/2016 18:49   Dg Chest Port 1 View  Result Date: 03/14/2016 CLINICAL DATA:  Altered mental status EXAM: PORTABLE CHEST 1 VIEW COMPARISON:  12/19/2015 chest radiograph. FINDINGS: Stable cardiomediastinal silhouette with top-normal heart size and aortic atherosclerosis. No pneumothorax. No pleural effusion. There is chronic severe bronchiectasis and volume loss in the apical upper lobes, right greater than left. Patchy opacity in the peripheral right mid lung appears increased. Stable mild scarring at the left lung base. IMPRESSION: Patchy opacity in the peripheral right mid lung appears increased, cannot exclude a pneumonia. This finding is superimposed on chronic severe bronchiectasis and volume loss in the apical upper lobes. Recommend follow-up PA and lateral post treatment chest radiographs in 4-6 weeks. Aortic atherosclerosis. Electronically Signed   By: Ilona Sorrel M.D.   On: 03/14/2016 17:28    EKG: Independently reviewed. Significant artifact noted appears to have sinus rhythm with RBBB and  LAFB.  Assessment/Plan Sepsis 2/2 to suspected community-acquired pneumonia and/or Influenza: Acute. Patient presents altered with fever, tachycardia, and tachypnea. SIRS criteria are met. Lactic acid noted to be reassuring at 1.69. Chest x-ray revealing possible pneumonia. Sepsis protocol was initiated. - Admit to stepdown - Sepsis order set initiated - Droplet precautions - Follow-up cultures and influenza screen - Continue empiric antibiotics of Rocephin and azithromycin per pharmacy - Will continue Tamiflu if seen to be positive for influenza. - Mucinex - Tylenol prn fever  Traumatic ICH: Acute. Patient found to have a small left lateral ventricle hemorrhage. Neurosurgery consult and recommended repeat CT scan in a.m. and frequent neuro checks - Check PT/INR - neuro checks to 2 hours 6 - Goal systolic blood pressure less than 140 - Repeat CT scan at 7 AM  Diabetes mellitus type 2: On admission patient's blood glucose noted to be elevated at 216. Suspect some aspect of acute stress. Patient home medications consist of glipizide and metformin. - Hypoglycemic protocol - Holld glipizide and metformin  - CBGs with SSI  Question possible CHF: Due to the patient's recent cough and lower extremity swelling seen on physical exam. - Check BNP - May give additional IV Lasix, if needed  Essential Hypertension - Continue Procardia and torosemide  Anxiety - Continue Klonopin prn  Constipation: chronix - Continue MiraLAX prn - Consider need of enema    Chronic kidney disease stage III: Stable. Creatinine 1.21 which appears near baseline. - Continue to monitor   Sarcoidosis: He notes a history of pulmonary sarcoidosis but has not had any issues with this recently to their knowledge.   Anemia: Stable. Hemoglobin 11.7 on admission. - Recheck CBC in a.m.  DVT prophylaxis: SCDs Code Status: DNR Family Communication: Discussed plan of care with the patient and family present at  bedside Disposition Plan: TBD Consults called: none Admission status: Inpatient  Norval Morton MD [facility] Pager [phone]  If 7PM-7AM, please contact night-coverage [email or web address] Password [facility]  03/14/2016, 8:18 PM

## 2016-03-15 ENCOUNTER — Inpatient Hospital Stay (HOSPITAL_COMMUNITY): Payer: Medicare Other

## 2016-03-15 DIAGNOSIS — R6510 Systemic inflammatory response syndrome (SIRS) of non-infectious origin without acute organ dysfunction: Secondary | ICD-10-CM

## 2016-03-15 LAB — URINALYSIS, ROUTINE W REFLEX MICROSCOPIC
BILIRUBIN URINE: NEGATIVE
GLUCOSE, UA: NEGATIVE mg/dL
KETONES UR: NEGATIVE mg/dL
Nitrite: POSITIVE — AB
Protein, ur: NEGATIVE mg/dL
Specific Gravity, Urine: 1.01 (ref 1.005–1.030)
pH: 7 (ref 5.0–8.0)

## 2016-03-15 LAB — CBG MONITORING, ED
GLUCOSE-CAPILLARY: 161 mg/dL — AB (ref 65–99)
GLUCOSE-CAPILLARY: 148 mg/dL — AB (ref 65–99)
Glucose-Capillary: 190 mg/dL — ABNORMAL HIGH (ref 65–99)

## 2016-03-15 LAB — BASIC METABOLIC PANEL
ANION GAP: 9 (ref 5–15)
BUN: 11 mg/dL (ref 6–20)
CO2: 26 mmol/L (ref 22–32)
Calcium: 9.7 mg/dL (ref 8.9–10.3)
Chloride: 104 mmol/L (ref 101–111)
Creatinine, Ser: 1.08 mg/dL — ABNORMAL HIGH (ref 0.44–1.00)
GFR calc non Af Amer: 44 mL/min — ABNORMAL LOW (ref >60)
GFR, EST AFRICAN AMERICAN: 51 mL/min — AB (ref >60)
Glucose, Bld: 148 mg/dL — ABNORMAL HIGH (ref 65–99)
POTASSIUM: 4.3 mmol/L (ref 3.5–5.1)
SODIUM: 139 mmol/L (ref 135–145)

## 2016-03-15 LAB — STREP PNEUMONIAE URINARY ANTIGEN: STREP PNEUMO URINARY ANTIGEN: NEGATIVE

## 2016-03-15 LAB — CBC
HEMATOCRIT: 34.7 % — AB (ref 36.0–46.0)
HEMOGLOBIN: 11.6 g/dL — AB (ref 12.0–15.0)
MCH: 30.9 pg (ref 26.0–34.0)
MCHC: 33.4 g/dL (ref 30.0–36.0)
MCV: 92.5 fL (ref 78.0–100.0)
Platelets: 158 10*3/uL (ref 150–400)
RBC: 3.75 MIL/uL — AB (ref 3.87–5.11)
RDW: 13.8 % (ref 11.5–15.5)
WBC: 6.4 10*3/uL (ref 4.0–10.5)

## 2016-03-15 LAB — GLUCOSE, CAPILLARY
GLUCOSE-CAPILLARY: 130 mg/dL — AB (ref 65–99)
GLUCOSE-CAPILLARY: 183 mg/dL — AB (ref 65–99)

## 2016-03-15 LAB — LACTIC ACID, PLASMA: LACTIC ACID, VENOUS: 1.1 mmol/L (ref 0.5–1.9)

## 2016-03-15 LAB — BRAIN NATRIURETIC PEPTIDE: B NATRIURETIC PEPTIDE 5: 197.2 pg/mL — AB (ref 0.0–100.0)

## 2016-03-15 LAB — TROPONIN I

## 2016-03-15 MED ORDER — FUROSEMIDE 10 MG/ML IJ SOLN
20.0000 mg | INTRAMUSCULAR | Status: AC
  Administered 2016-03-15: 20 mg via INTRAVENOUS
  Filled 2016-03-15: qty 2

## 2016-03-15 NOTE — ED Notes (Signed)
Patient transported to CT 

## 2016-03-15 NOTE — Progress Notes (Signed)
CSW received call from patients daughter, [name], requesting information about discharge plans. CSW informed daughter of need for PT/OT evaluation. Daughter asked for weekday CSW to follow up with her. [name]- [phone]- [phone]  Stacy GardnerErin Audi Conover, LCSWA Clinical Social Worker [phone]

## 2016-03-15 NOTE — ED Notes (Signed)
CBG 161 

## 2016-03-15 NOTE — ED Notes (Signed)
Pulled pt.up in bed so pt.could eat breakfast.

## 2016-03-15 NOTE — Progress Notes (Signed)
PROGRESS NOTE    Elizabeth Keller  IEP:[id number] DOB: [birth year] DOA: 03/14/2016 PCP: Reeves Dam, MD  Brief Narrative:  Elizabeth Keller is a 83 y.o. female with medical history significant of HTN, HLD, DM type II, CKD stage III and sarcoidosis; who presents from home with complaints of multiple falls and being acutely altered. At baseline the patient is noted to be ambulatory with walker and of sound mind. Patient is noted have somewhat of a shuffling gait. Sometime around 1 PM this afternoon she had 5 consecutive falls while trying to get up. Family noted that she was somewhat confused and her mental status, seemed to wax and wane. Other associated symptoms include chronic intermittent abdominal pain, constipation, leg swelling, decreased appetite, and productive cough(new). Patient had been taking over-the-counter cough medicine. Family denies knowledge of any fever, vomiting, chills, diarrhea, chest pain, or shortness of breath complaints. She was worked up and Code Sepsis was called and she received IVF, Azithromycin and Tamiflu. CT Scan of Brain revealed a mall left Lateral Ventricle hemorrhage. Neurosurgery was contacted and recommended Neurochecks and to repeat CT Scan this Am. TRH called to admit and patient and she was found to be Influenza Positive.   Assessment & Plan:   Principal Problem:   Sepsis ([place]) Active Problems:   Influenza A   CAP (community acquired pneumonia)   ICH (intracerebral hemorrhage) ([place])   Diabetes mellitus type II, controlled ([place])   Chronic kidney disease, stage III (moderate)   Anemia   Hypertension  SIRS 2/2 to suspected Community-acquired pneumonia and Influenza A: -Acute. Patient presents altered with fever, tachycardia, and tachypnea. SIRS criteria are met. Lactic acid noted to be reassuring at 1.69. Chest x-ray revealing possible pneumonia. Sepsis protocol was initiated. - Stable for Telemetry - WBC not elevated and Afebrile;  - Procalcitonin 0.22;  Lactic Acid Level was 1.1 - Sepsis order set initiated; Repeat CXR in AM - C/w Droplet precautions now that  - Follow-up cultures showed NGTD < 24 hours; Urine Cx pending  - Breathing Tx with Levalbuterol q4hprn - Continue empiric antibiotics of Rocephin and azithromycin per pharmacy - Will continue Tamiflu as patient is positive for Influenza A - Mucinex - Tylenol prn fever  Traumatic ICH:  -Acute. Patient found to have a small left lateral ventricle hemorrhage.  -Neurosurgery consult and recommended repeat CT scan in a.m. and frequent neuro checks - PT/INR was 15.8/1.25 - neuro checks to 2 hours 6 - Goal systolic blood pressure less than 140 - Repeat CT Scan showed The blood seen in the left lateral ventricle previously is stable in overall quantity. However, there has been some redistribution from the body of the left lateral ventricle into the posterior horn and perhaps into the suprasellar cistern. No increased hemorrhage today. - Confused but no focal deficits noted.   Diabetes Mellitus Type 2: -On admission patient's blood glucose noted to be elevated at 216. Suspect some aspect of acute stress. Patient home medications consist of glipizide and metformin. - Hypoglycemic protocol - Holld glipizide and metformin  - CBGs with SSI  Question possible CHF:  -Due to the patient's recent cough and lower extremity swelling seen on physical exam. - Check BNP - May give additional IV Lasix, if needed  Essential Hypertension - Continue Procardia and torosemide  Anxiety - Continue Klonopin prn  Constipation: chronix - Continue MiraLAX prn - Consider need of enema    Chronic kidney disease stage III:  -Stable. Creatinine 1.08 - Continue to monitor  -  C/w Demadex  Sarcoidosis:  -She notes a history of pulmonary sarcoidosis but has not had any issues with this recently to their knowledge.   Anemia:  -Stable.  -Hemoglobin 11.7 on admission. Now 11.6 -Recheck CBC in  a.m.  DVT prophylaxis: SCDs Code Status: DO NOT RESUSCITATE Family Communication: Discussed with Daughter at bedside Disposition Plan: Inpateint; Will need PT/OT Eval  Consultants:   Neurosurgery   Procedures: CT Scan   Antimicrobials:  Anti-infectives    Start     Dose/Rate Route Frequency Ordered Stop   03/15/16 1800  cefTRIAXone (ROCEPHIN) 1 g in dextrose 5 % 50 mL IVPB     1 g 100 mL/hr over 30 Minutes Intravenous Every 24 hours 03/14/16 2052     03/15/16 1000  azithromycin (ZITHROMAX) tablet 250 mg     250 mg Oral Daily 03/14/16 2052 03/19/16 0959   03/15/16 1000  oseltamivir (TAMIFLU) capsule 30 mg     30 mg Oral 2 times daily 03/14/16 2232 03/20/16 0959   03/14/16 1830  oseltamivir (TAMIFLU) capsule 30 mg     30 mg Oral  Once 03/14/16 1816 03/14/16 2208   03/14/16 1730  cefTRIAXone (ROCEPHIN) 1 g in dextrose 5 % 50 mL IVPB     1 g 100 mL/hr over 30 Minutes Intravenous  Once 03/14/16 1715 03/14/16 2119   03/14/16 1730  azithromycin (ZITHROMAX) tablet 500 mg     500 mg Oral  Once 03/14/16 1715 03/14/16 1739     Subjective: Seen and examined at bedside and was doing well. No complaints and no SOB. Remains slightly confused.   Objective: Vitals:   03/15/16 1300 03/15/16 1310 03/15/16 1400 03/15/16 1543  BP: 158/94  147/95 112/81  Pulse: 103  100 95  Resp: 25  22 18   Temp:  98.9 F (37.2 C)  98.9 F (37.2 C)  TempSrc:  Oral  Oral  SpO2: 96%  94% 97%  Weight:      Height:        Intake/Output Summary (Last 24 hours) at 03/15/16 1801 Last data filed at 03/14/16 2119  Gross per 24 hour  Intake              550 ml  Output                0 ml  Net              550 ml   Filed Weights   03/14/16 1657  Weight: 68 kg (150 lb)   Examination: Physical Exam:  Constitutional: WN/WD obsese, NAD and appears calm and comfortable Eyes: Lids and conjunctivae normal, sclerae anicteric  ENMT: External Ears, Nose appear normal. Grossly normal hearing.  Neck: Appears  normal, supple, no cervical masses, normal ROM, no appreciable thyromegaly Respiratory: Diminished to auscultation bilaterally, no wheezing, rales, rhonchi or crackles. Normal respiratory effort and patient is not tachypenic. No accessory muscle use.  Cardiovascular: RRR, no murmurs / rubs / gallops. S1 and S2 auscultated.  Abdomen: Soft, non-tender, non-distended. No masses palpated. No appreciable hepatosplenomegaly. Bowel sounds positive x4 GU: Deferred. Musculoskeletal: No clubbing / cyanosis of digits/nails. No joint deformity upper and lower extremities.  Skin: No rashes, lesions, ulcers on limited skin eval. No induration; Warm and dry.  Neurologic: CN 2-12 grossly intact with no focal deficits. Sensation intact in all 4 Extremities,  Romberg sign cerebellar reflexes not assessed.  Psychiatric: Normal judgment and insight. Alert but not oriented. Normal mood and appropriate affect.  Data Reviewed: I have personally reviewed following labs and imaging studies  CBC:  Recent Labs Lab 03/14/16 1716 03/15/16 0413  WBC 7.4 6.4  NEUTROABS 5.7  --   HGB 11.7* 11.6*  HCT 35.3* 34.7*  MCV 92.9 92.5  PLT 153 400   Basic Metabolic Panel:  Recent Labs Lab 03/14/16 1716 03/15/16 0413  NA 136 139  K 4.0 4.3  CL 101 104  CO2 24 26  GLUCOSE 216* 148*  BUN 12 11  CREATININE 1.21* 1.08*  CALCIUM 9.9 9.7   GFR: Estimated Creatinine Clearance: 33.7 mL/min (by C-G formula based on SCr of 1.08 mg/dL (H)). Liver Function Tests:  Recent Labs Lab 03/14/16 1716  AST 69*  ALT 52  ALKPHOS 99  BILITOT 0.4  PROT 6.7  ALBUMIN 3.8   No results for input(s): LIPASE, AMYLASE in the last 168 hours. No results for input(s): AMMONIA in the last 168 hours. Coagulation Profile:  Recent Labs Lab 03/14/16 2122  INR 1.25   Cardiac Enzymes:  Recent Labs Lab 03/15/16 0413  TROPONINI <0.03   BNP (last 3 results) No results for input(s): PROBNP in the last 8760 hours. HbA1C: No  results for input(s): HGBA1C in the last 72 hours. CBG:  Recent Labs Lab 03/14/16 2311 03/15/16 0745 03/15/16 1318 03/15/16 1440 03/15/16 1758  GLUCAP 186* 190* 161* 148* 130*   Lipid Profile: No results for input(s): CHOL, HDL, LDLCALC, TRIG, CHOLHDL, LDLDIRECT in the last 72 hours. Thyroid Function Tests: No results for input(s): TSH, T4TOTAL, FREET4, T3FREE, THYROIDAB in the last 72 hours. Anemia Panel: No results for input(s): VITAMINB12, FOLATE, FERRITIN, TIBC, IRON, RETICCTPCT in the last 72 hours. Sepsis Labs:  Recent Labs Lab 03/14/16 1729 03/14/16 2122 03/15/16 0413  PROCALCITON  --  0.22  --   LATICACIDVEN 1.69 1.0 1.1    Recent Results (from the past 240 hour(s))  Blood Culture (routine x 2)     Status: None (Preliminary result)   Collection Time: 03/14/16  5:22 PM  Result Value Ref Range Status   Specimen Description BLOOD RIGHT HAND  Final   Special Requests IN PEDIATRIC BOTTLE 2CC  Final   Culture NO GROWTH < 24 HOURS  Final   Report Status PENDING  Incomplete  Blood Culture (routine x 2)     Status: None (Preliminary result)   Collection Time: 03/14/16  5:37 PM  Result Value Ref Range Status   Specimen Description BLOOD LEFT HAND  Final   Special Requests IN PEDIATRIC BOTTLE 2CC  Final   Culture NO GROWTH < 24 HOURS  Final   Report Status PENDING  Incomplete    Radiology Studies: Ct Head Wo Contrast  Result Date: 03/15/2016 CLINICAL DATA:  Follow-up intracranial hemorrhage. EXAM: CT HEAD WITHOUT CONTRAST TECHNIQUE: Contiguous axial images were obtained from the base of the skull through the vertex without intravenous contrast. COMPARISON:  March 14, 2016 FINDINGS: Brain: No subdural, epidural, or parenchymal hemorrhage. The acute blood products in the body of the left lateral ventricle on the previous study have decreased in quantity with only a tiny amount of blood remaining at this site. However, the blood in the body of the left lateral ventricle on  the previous study has redistributed into the posterior horn with an increase in this location. Slight high attenuation in the left suprasellar cistern may represent a tiny amount of redistributed blood as well. Overall, the amount of hemorrhage has not increased. The ventricles are prominent but stable. The sulci are  unchanged. White matter changes are identified. No acute cortical ischemia identified. The cerebellum and brainstem are normal. The basal cisterns are widely patent. Vascular: Calcified atherosclerosis seen in the intracranial carotid arteries. Skull: Normal. Negative for fracture or focal lesion. Sinuses/Orbits: No acute finding. Other: The posterior right scalp hematoma remains but is smaller in the interval. Extracranial soft tissues are otherwise stable. IMPRESSION: 1. The blood seen in the left lateral ventricle previously is stable in overall quantity. However, there has been some redistribution from the body of the left lateral ventricle into the posterior horn and perhaps into the suprasellar cistern. No increased hemorrhage today. Electronically Signed   By: Dorise Bullion III M.D   On: 03/15/2016 07:17   Ct Head Wo Contrast  Result Date: 03/14/2016 CLINICAL DATA:  Multiple falls today, altered level of consciousness. EXAM: CT HEAD WITHOUT CONTRAST TECHNIQUE: Contiguous axial images were obtained from the base of the skull through the vertex without intravenous contrast. COMPARISON:  Head CT dated 01/29/2014. FINDINGS: Brain: There is generalized age related parenchymal atrophy with commensurate dilatation of the ventricles and sulci. Chronic small vessel ischemic changes noted within the bilateral periventricular and subcortical white matter regions. Small amount of acute hemorrhage is seen within the body of the left lateral ventricle and layering within the posterior horn of the left lateral ventricle. No parenchymal or extra-axial hemorrhage. Vascular: There are chronic calcified  atherosclerotic changes of the large vessels at the skull base. No unexpected hyperdense vessel. Skull: Normal. Negative for fracture or focal lesion. Sinuses/Orbits: No acute finding. Other: Scalp hematoma overlying the right occipital bone, measuring approximately 1.6 cm thickness. No underlying fracture. IMPRESSION: 1. Small amount of acute hemorrhage within the left lateral ventricle. 2. No parenchymal or extra-axial hemorrhage. No evidence of parenchymal edema or mass effect. 3. Scalp hematoma overlying the right occipital bone, measuring approximately 1.6 cm thickness. No underlying fracture. 4. Atrophy and chronic ischemic changes in the white matter. Critical Value/emergent results were called by telephone at the time of interpretation on 03/14/2016 at 6:45 pm to Dr. Quintella Reichert , who verbally acknowledged these results. Needs Electronically Signed   By: Franki Cabot M.D.   On: 03/14/2016 18:49   Dg Chest Port 1 View  Result Date: 03/14/2016 CLINICAL DATA:  Altered mental status EXAM: PORTABLE CHEST 1 VIEW COMPARISON:  12/19/2015 chest radiograph. FINDINGS: Stable cardiomediastinal silhouette with top-normal heart size and aortic atherosclerosis. No pneumothorax. No pleural effusion. There is chronic severe bronchiectasis and volume loss in the apical upper lobes, right greater than left. Patchy opacity in the peripheral right mid lung appears increased. Stable mild scarring at the left lung base. IMPRESSION: Patchy opacity in the peripheral right mid lung appears increased, cannot exclude a pneumonia. This finding is superimposed on chronic severe bronchiectasis and volume loss in the apical upper lobes. Recommend follow-up PA and lateral post treatment chest radiographs in 4-6 weeks. Aortic atherosclerosis. Electronically Signed   By: Ilona Sorrel M.D.   On: 03/14/2016 17:28   Scheduled Meds: . acidophilus  1 capsule Oral Daily  . aspirin EC  81 mg Oral Daily  . azithromycin  250 mg Oral Daily    . cefTRIAXone (ROCEPHIN)  IV  1 g Intravenous Q24H  . cholecalciferol  1,000 Units Oral Daily  . DULoxetine  60 mg Oral Daily  . guaiFENesin  600 mg Oral BID  . insulin aspart  0-9 Units Subcutaneous Q4H  . magnesium oxide  400 mg Oral Daily  . megestrol  20 mg Oral Daily  . mirtazapine  30 mg Oral QHS  . NIFEdipine  30 mg Oral Daily  . oseltamivir  30 mg Oral BID  . pantoprazole  40 mg Oral Daily  . polyethylene glycol  17 g Oral Daily  . potassium chloride SA  20 mEq Oral Daily  . simvastatin  40 mg Oral QPM  . torsemide  5 mg Oral Daily   Continuous Infusions:   LOS: 1 day   Kerney Elbe, DO [facility] Pager [phone]  If 7PM-7AM, please contact night-coverage [email or web address] Password [email or web address] 03/15/2016, 6:01 PM

## 2016-03-15 NOTE — ED Notes (Signed)
Pt eating [place] tray

## 2016-03-16 LAB — COMPREHENSIVE METABOLIC PANEL
ALT: 33 U/L (ref 14–54)
AST: 51 U/L — AB (ref 15–41)
Albumin: 3.2 g/dL — ABNORMAL LOW (ref 3.5–5.0)
Alkaline Phosphatase: 82 U/L (ref 38–126)
Anion gap: 9 (ref 5–15)
BUN: 13 mg/dL (ref 6–20)
CHLORIDE: 103 mmol/L (ref 101–111)
CO2: 26 mmol/L (ref 22–32)
CREATININE: 1.02 mg/dL — AB (ref 0.44–1.00)
Calcium: 9 mg/dL (ref 8.9–10.3)
GFR calc Af Amer: 54 mL/min — ABNORMAL LOW (ref >60)
GFR calc non Af Amer: 47 mL/min — ABNORMAL LOW (ref >60)
Glucose, Bld: 137 mg/dL — ABNORMAL HIGH (ref 65–99)
Potassium: 4.3 mmol/L (ref 3.5–5.1)
SODIUM: 138 mmol/L (ref 135–145)
Total Bilirubin: 1.1 mg/dL (ref 0.3–1.2)
Total Protein: 6.1 g/dL — ABNORMAL LOW (ref 6.5–8.1)

## 2016-03-16 LAB — CBC WITH DIFFERENTIAL/PLATELET
BASOS ABS: 0 10*3/uL (ref 0.0–0.1)
Basophils Relative: 1 %
EOS PCT: 3 %
Eosinophils Absolute: 0.2 10*3/uL (ref 0.0–0.7)
HCT: 34.8 % — ABNORMAL LOW (ref 36.0–46.0)
Hemoglobin: 11.6 g/dL — ABNORMAL LOW (ref 12.0–15.0)
LYMPHS PCT: 10 %
Lymphs Abs: 0.5 10*3/uL — ABNORMAL LOW (ref 0.7–4.0)
MCH: 30.9 pg (ref 26.0–34.0)
MCHC: 33.3 g/dL (ref 30.0–36.0)
MCV: 92.6 fL (ref 78.0–100.0)
Monocytes Absolute: 0.8 10*3/uL (ref 0.1–1.0)
Monocytes Relative: 14 %
NEUTROS ABS: 3.8 10*3/uL (ref 1.7–7.7)
Neutrophils Relative %: 72 %
PLATELETS: 152 10*3/uL (ref 150–400)
RBC: 3.76 MIL/uL — AB (ref 3.87–5.11)
RDW: 14.2 % (ref 11.5–15.5)
WBC: 5.3 10*3/uL (ref 4.0–10.5)

## 2016-03-16 LAB — URINE CULTURE

## 2016-03-16 LAB — GLUCOSE, CAPILLARY
GLUCOSE-CAPILLARY: 141 mg/dL — AB (ref 65–99)
GLUCOSE-CAPILLARY: 194 mg/dL — AB (ref 65–99)
Glucose-Capillary: 131 mg/dL — ABNORMAL HIGH (ref 65–99)
Glucose-Capillary: 145 mg/dL — ABNORMAL HIGH (ref 65–99)
Glucose-Capillary: 343 mg/dL — ABNORMAL HIGH (ref 65–99)

## 2016-03-16 LAB — LEGIONELLA PNEUMOPHILA SEROGP 1 UR AG: L. PNEUMOPHILA SEROGP 1 UR AG: NEGATIVE

## 2016-03-16 LAB — MAGNESIUM: Magnesium: 2.2 mg/dL (ref 1.7–2.4)

## 2016-03-16 LAB — PHOSPHORUS: Phosphorus: 3.5 mg/dL (ref 2.5–4.6)

## 2016-03-16 MED ORDER — FUROSEMIDE 10 MG/ML IJ SOLN
20.0000 mg | Freq: Once | INTRAMUSCULAR | Status: AC
  Administered 2016-03-16: 20 mg via INTRAVENOUS
  Filled 2016-03-16: qty 2

## 2016-03-16 MED ORDER — IPRATROPIUM-ALBUTEROL 0.5-2.5 (3) MG/3ML IN SOLN: 3.0000 mL | RESPIRATORY_TRACT | Status: DC | PRN

## 2016-03-16 MED ORDER — IPRATROPIUM-ALBUTEROL 0.5-2.5 (3) MG/3ML IN SOLN: 3.0000 mL | Freq: Four times a day (QID) | RESPIRATORY_TRACT | Status: DC

## 2016-03-16 MED ORDER — LEVALBUTEROL HCL 0.63 MG/3ML IN NEBU: 0.6300 mg | INHALATION_SOLUTION | Freq: Three times a day (TID) | RESPIRATORY_TRACT | Status: DC

## 2016-03-16 NOTE — Progress Notes (Addendum)
PROGRESS NOTE    Elizabeth Keller  JEH:[id number] DOB: 06/27/1923 DOA: 03/14/2016 PCP: Reeves Dam, MD  Brief Narrative:  Elizabeth Keller is a 83 y.o. female with medical history significant of HTN, HLD, DM type II, CKD stage III and sarcoidosis; who presents from home with complaints of multiple falls and being acutely altered. At baseline the patient is noted to be ambulatory with walker and of sound mind. Patient is noted have somewhat of a shuffling gait. Sometime around 1 PM this afternoon she had 5 consecutive falls while trying to get up. Family noted that she was somewhat confused and her mental status, seemed to wax and wane. Other associated symptoms include chronic intermittent abdominal pain, constipation, leg swelling, decreased appetite, and productive cough(new). Patient had been taking over-the-counter cough medicine. Family denies knowledge of any fever, vomiting, chills, diarrhea, chest pain, or shortness of breath complaints. She was worked up and Code Sepsis was called and she received IVF, Azithromycin and Tamiflu. CT Scan of Brain revealed a mall left Lateral Ventricle hemorrhage. Neurosurgery was contacted and recommended Neurochecks and to repeat CT Scan this Am. TRH called to admit and patient and she was found to be Influenza Positive. PT evaluated and recommends SNF.  Assessment & Plan:   Principal Problem:   Sepsis ([place]) Active Problems:   Influenza A   CAP (community acquired pneumonia)   ICH (intracerebral hemorrhage) ([place])   Diabetes mellitus type II, controlled ([place])   Chronic kidney disease, stage III (moderate)   Anemia   Hypertension  SIRS 2/2 to suspected Community-acquired pneumonia and Influenza A: -Acute. Patient presents altered with fever, tachycardia, and tachypnea. SIRS criteria are met. Lactic acid noted to be reassuring at 1.69. Chest x-ray revealing possible pneumonia. Sepsis protocol was initiated. - Stable for Telemetry - WBC not elevated and  Afebrile;  WBC was 5.3 and Temperature was 97.9 - Procalcitonin 0.22; Lactic Acid Level was 1.1 - Sepsis order set initiated; Repeat CXR in AM - C/w Droplet precautions now that + for Influenza - Follow-up cultures showed NGTD at 2 days; Urine Cx <10,000  - Breathing Tx with Levalbuterol q4hprn and with DuoNeb 3 mL q4hprn  - Continue empiric antibiotics of Rocephin and azithromycin per pharmacy - Will continue Tamiflu as patient is positive for Influenza A - Mucinex 600 mg po BID - Tylenol prn fever - PT/OT Consulted and Recommend SNF  Traumatic ICH from Fall:  -Acute. Patient found to have a small left lateral ventricle hemorrhage.  -Neurosurgery consult and recommended repeat CT scan in a.m. and frequent neuro checks - PT/INR was 15.8/1.25 - neuro checks to 2 hours 6 - Goal systolic blood pressure less than 140 - Repeat CT Scan showed The blood seen in the left lateral ventricle previously is stable in overall quantity. However, there has been some redistribution from the body of the left lateral ventricle into the posterior horn and perhaps into the suprasellar cistern. No increased hemorrhage today. - No focal deficits noted.  - PT/OT recommend SNF  Diabetes Mellitus Type 2: -On admission patient's blood glucose noted to be elevated at 216. Suspect some aspect of acute stress. Patient home medications consist of glipizide and metformin. - Hypoglycemic protocol - Hold glipizide and metformin  - CBGs with Sensitive Novolog SSI AC - CBG's ranging from 131-194  Question possible CHF:  -Due to the patient's recent cough and lower extremity swelling seen on physical exam. - BNP 197.2 - Was given IV Lasix 20 mg for 1 dose  this Am; - Continue to Monitor Volume Status; Appeared overloaded - Check ECHOCardiogram  Essential Hypertension - Continue Nifedipine 30 mg po Daily and Torsemide 5 mg Daily  Anxiety - Continue Klonopin 0.25-0.5 mg po BID prn  Constipation: chronix -  Continue MiraLAX prn - Consider need of enema    Chronic kidney disease stage III:  -Stable. Creatinine 1.02 - Continue to monitor  - C/w Demadex  Sarcoidosis:  -She notes a history of pulmonary sarcoidosis but has not had any issues with this recently to their knowledge.   Anemia:  -Stable.  -Hemoglobin 11.7 on admission. Now 11.6 -Recheck CBC in a.m.  DVT prophylaxis: SCDs Code Status: DO NOT RESUSCITATE Family Communication: Discussed with Daughter at bedside Disposition Plan: Likely SNF at D/C  Consultants:   Neurosurgery   Procedures: CT Scan   Antimicrobials:  Anti-infectives    Start     Dose/Rate Route Frequency Ordered Stop   03/15/16 1800  cefTRIAXone (ROCEPHIN) 1 g in dextrose 5 % 50 mL IVPB     1 g 100 mL/hr over 30 Minutes Intravenous Every 24 hours 03/14/16 2052     03/15/16 1000  azithromycin (ZITHROMAX) tablet 250 mg     250 mg Oral Daily 03/14/16 2052 03/19/16 0959   03/15/16 1000  oseltamivir (TAMIFLU) capsule 30 mg     30 mg Oral 2 times daily 03/14/16 2232 03/20/16 0959   03/14/16 1830  oseltamivir (TAMIFLU) capsule 30 mg     30 mg Oral  Once 03/14/16 1816 03/14/16 2208   03/14/16 1730  cefTRIAXone (ROCEPHIN) 1 g in dextrose 5 % 50 mL IVPB     1 g 100 mL/hr over 30 Minutes Intravenous  Once 03/14/16 1715 03/14/16 2119   03/14/16 1730  azithromycin (ZITHROMAX) tablet 500 mg     500 mg Oral  Once 03/14/16 1715 03/14/16 1739     Subjective: Seen and examined at bedside and was eating breakfast. No Complaints except cough. Mild SOB. Has swelling in legs. No other concerns or complaints.   Objective: Vitals:   03/15/16 1543 03/15/16 2150 03/16/16 0429 03/16/16 1452  BP: 112/81 124/65 (!) 147/69 129/60  Pulse: 95 93 95 91  Resp: 18 18 18 18   Temp: 98.9 F (37.2 C) 98.2 F (36.8 C) 98.9 F (37.2 C) 97.9 F (36.6 C)  TempSrc: Oral Oral Oral Oral  SpO2: 97% 93% 95% 97%  Weight:      Height:        Intake/Output Summary (Last 24 hours)  at 03/16/16 1618 Last data filed at 03/16/16 1132  Gross per 24 hour  Intake              650 ml  Output                0 ml  Net              650 ml   Filed Weights   03/14/16 1657  Weight: 68 kg (150 lb)   Examination: Physical Exam:  Constitutional: WN/WD obsese, NAD and appears calm and comfortable Eyes: Lids and conjunctivae normal, sclerae anicteric  ENMT: External Ears, Nose appear normal. Grossly normal hearing.  Neck: Appears normal, supple, no cervical masses, normal ROM, no appreciable thyromegaly Respiratory: Diminished to auscultation bilaterally, no wheezing, rales, rhonchi or crackles. Normal respiratory effort and patient is not tachypenic. No accessory muscle use.  Cardiovascular: RRR, no murmurs / rubs / gallops. S1 and S2 auscultated. 1+ lower extremity edema. Abdomen:  Soft, non-tender, non-distended. No masses palpated. No appreciable hepatosplenomegaly. Bowel sounds positive x4 GU: Deferred. Musculoskeletal: No clubbing / cyanosis of digits/nails. No joint deformity upper and lower extremities.  Skin: No rashes, lesions, ulcers on limited skin eval. No induration; Warm and dry.  Neurologic: CN 2-12 grossly intact with no focal deficits. Sensation intact in all 4 Extremities,  Romberg sign cerebellar reflexes not assessed.  Psychiatric: Normal judgment and insight. Awake and Alert but not oriented x3. Normal mood and appropriate affect.   Data Reviewed: I have personally reviewed following labs and imaging studies  CBC:  Recent Labs Lab 03/14/16 1716 03/15/16 0413 03/16/16 0429  WBC 7.4 6.4 5.3  NEUTROABS 5.7  --  3.8  HGB 11.7* 11.6* 11.6*  HCT 35.3* 34.7* 34.8*  MCV 92.9 92.5 92.6  PLT 153 158 073   Basic Metabolic Panel:  Recent Labs Lab 03/14/16 1716 03/15/16 0413 03/16/16 0429  NA 136 139 138  K 4.0 4.3 4.3  CL 101 104 103  CO2 24 26 26   GLUCOSE 216* 148* 137*  BUN 12 11 13   CREATININE 1.21* 1.08* 1.02*  CALCIUM 9.9 9.7 9.0  MG  --    --  2.2  PHOS  --   --  3.5   GFR: Estimated Creatinine Clearance: 35.6 mL/min (by C-G formula based on SCr of 1.02 mg/dL (H)). Liver Function Tests:  Recent Labs Lab 03/14/16 1716 03/16/16 0429  AST 69* 51*  ALT 52 33  ALKPHOS 99 82  BILITOT 0.4 1.1  PROT 6.7 6.1*  ALBUMIN 3.8 3.2*   No results for input(s): LIPASE, AMYLASE in the last 168 hours. No results for input(s): AMMONIA in the last 168 hours. Coagulation Profile:  Recent Labs Lab 03/14/16 2122  INR 1.25   Cardiac Enzymes:  Recent Labs Lab 03/15/16 0413  TROPONINI <0.03   BNP (last 3 results) No results for input(s): PROBNP in the last 8760 hours. HbA1C: No results for input(s): HGBA1C in the last 72 hours. CBG:  Recent Labs Lab 03/15/16 2037 03/16/16 0015 03/16/16 0431 03/16/16 0801 03/16/16 1224  GLUCAP 183* 145* 131* 141* 194*   Lipid Profile: No results for input(s): CHOL, HDL, LDLCALC, TRIG, CHOLHDL, LDLDIRECT in the last 72 hours. Thyroid Function Tests: No results for input(s): TSH, T4TOTAL, FREET4, T3FREE, THYROIDAB in the last 72 hours. Anemia Panel: No results for input(s): VITAMINB12, FOLATE, FERRITIN, TIBC, IRON, RETICCTPCT in the last 72 hours. Sepsis Labs:  Recent Labs Lab 03/14/16 1729 03/14/16 2122 03/15/16 0413  PROCALCITON  --  0.22  --   LATICACIDVEN 1.69 1.0 1.1    Recent Results (from the past 240 hour(s))  Blood Culture (routine x 2)     Status: None (Preliminary result)   Collection Time: 03/14/16  5:22 PM  Result Value Ref Range Status   Specimen Description BLOOD RIGHT HAND  Final   Special Requests IN PEDIATRIC BOTTLE 2CC  Final   Culture NO GROWTH 2 DAYS  Final   Report Status PENDING  Incomplete  Blood Culture (routine x 2)     Status: None (Preliminary result)   Collection Time: 03/14/16  5:37 PM  Result Value Ref Range Status   Specimen Description BLOOD LEFT HAND  Final   Special Requests IN PEDIATRIC BOTTLE 2CC  Final   Culture NO GROWTH 2 DAYS   Final   Report Status PENDING  Incomplete  Urine culture     Status: Abnormal   Collection Time: 03/15/16  6:19 AM  Result  Value Ref Range Status   Specimen Description URINE, CATHETERIZED  Final   Special Requests ADDED 0701  Final   Culture <10,000 COLONIES/mL INSIGNIFICANT GROWTH (A)  Final   Report Status 03/16/2016 FINAL  Final    Radiology Studies: Ct Head Wo Contrast  Result Date: 03/15/2016 CLINICAL DATA:  Follow-up intracranial hemorrhage. EXAM: CT HEAD WITHOUT CONTRAST TECHNIQUE: Contiguous axial images were obtained from the base of the skull through the vertex without intravenous contrast. COMPARISON:  March 14, 2016 FINDINGS: Brain: No subdural, epidural, or parenchymal hemorrhage. The acute blood products in the body of the left lateral ventricle on the previous study have decreased in quantity with only a tiny amount of blood remaining at this site. However, the blood in the body of the left lateral ventricle on the previous study has redistributed into the posterior horn with an increase in this location. Slight high attenuation in the left suprasellar cistern may represent a tiny amount of redistributed blood as well. Overall, the amount of hemorrhage has not increased. The ventricles are prominent but stable. The sulci are unchanged. White matter changes are identified. No acute cortical ischemia identified. The cerebellum and brainstem are normal. The basal cisterns are widely patent. Vascular: Calcified atherosclerosis seen in the intracranial carotid arteries. Skull: Normal. Negative for fracture or focal lesion. Sinuses/Orbits: No acute finding. Other: The posterior right scalp hematoma remains but is smaller in the interval. Extracranial soft tissues are otherwise stable. IMPRESSION: 1. The blood seen in the left lateral ventricle previously is stable in overall quantity. However, there has been some redistribution from the body of the left lateral ventricle into the posterior  horn and perhaps into the suprasellar cistern. No increased hemorrhage today. Electronically Signed   By: Dorise Bullion III M.D   On: 03/15/2016 07:17   Ct Head Wo Contrast  Result Date: 03/14/2016 CLINICAL DATA:  Multiple falls today, altered level of consciousness. EXAM: CT HEAD WITHOUT CONTRAST TECHNIQUE: Contiguous axial images were obtained from the base of the skull through the vertex without intravenous contrast. COMPARISON:  Head CT dated 01/29/2014. FINDINGS: Brain: There is generalized age related parenchymal atrophy with commensurate dilatation of the ventricles and sulci. Chronic small vessel ischemic changes noted within the bilateral periventricular and subcortical white matter regions. Small amount of acute hemorrhage is seen within the body of the left lateral ventricle and layering within the posterior horn of the left lateral ventricle. No parenchymal or extra-axial hemorrhage. Vascular: There are chronic calcified atherosclerotic changes of the large vessels at the skull base. No unexpected hyperdense vessel. Skull: Normal. Negative for fracture or focal lesion. Sinuses/Orbits: No acute finding. Other: Scalp hematoma overlying the right occipital bone, measuring approximately 1.6 cm thickness. No underlying fracture. IMPRESSION: 1. Small amount of acute hemorrhage within the left lateral ventricle. 2. No parenchymal or extra-axial hemorrhage. No evidence of parenchymal edema or mass effect. 3. Scalp hematoma overlying the right occipital bone, measuring approximately 1.6 cm thickness. No underlying fracture. 4. Atrophy and chronic ischemic changes in the white matter. Critical Value/emergent results were called by telephone at the time of interpretation on 03/14/2016 at 6:45 pm to Dr. Quintella Reichert , who verbally acknowledged these results. Needs Electronically Signed   By: Franki Cabot M.D.   On: 03/14/2016 18:49   Dg Chest Port 1 View  Result Date: 03/14/2016 CLINICAL DATA:  Altered  mental status EXAM: PORTABLE CHEST 1 VIEW COMPARISON:  12/19/2015 chest radiograph. FINDINGS: Stable cardiomediastinal silhouette with top-normal heart size and aortic  atherosclerosis. No pneumothorax. No pleural effusion. There is chronic severe bronchiectasis and volume loss in the apical upper lobes, right greater than left. Patchy opacity in the peripheral right mid lung appears increased. Stable mild scarring at the left lung base. IMPRESSION: Patchy opacity in the peripheral right mid lung appears increased, cannot exclude a pneumonia. This finding is superimposed on chronic severe bronchiectasis and volume loss in the apical upper lobes. Recommend follow-up PA and lateral post treatment chest radiographs in 4-6 weeks. Aortic atherosclerosis. Electronically Signed   By: Ilona Sorrel M.D.   On: 03/14/2016 17:28   Scheduled Meds: . acidophilus  1 capsule Oral Daily  . aspirin EC  81 mg Oral Daily  . azithromycin  250 mg Oral Daily  . cefTRIAXone (ROCEPHIN)  IV  1 g Intravenous Q24H  . cholecalciferol  1,000 Units Oral Daily  . DULoxetine  60 mg Oral Daily  . guaiFENesin  600 mg Oral BID  . insulin aspart  0-9 Units Subcutaneous Q4H  . magnesium oxide  400 mg Oral Daily  . megestrol  20 mg Oral Daily  . mirtazapine  30 mg Oral QHS  . NIFEdipine  30 mg Oral Daily  . oseltamivir  30 mg Oral BID  . pantoprazole  40 mg Oral Daily  . polyethylene glycol  17 g Oral Daily  . potassium chloride SA  20 mEq Oral Daily  . simvastatin  40 mg Oral QPM  . torsemide  5 mg Oral Daily   Continuous Infusions:   LOS: 2 days   Kerney Elbe, DO [facility] Pager [phone]  If 7PM-7AM, please contact night-coverage [email or web address] Password [email or web address] 03/16/2016, 4:18 PM

## 2016-03-16 NOTE — Evaluation (Signed)
Occupational Therapy Evaluation Patient Details Name: Elizabeth Keller: [id number] DOB: Apr 26, 1923 Today's Date: 03/16/2016    History of Present Illness Elizabeth Keller is a 83 y.o. female with medical history significant of HTN, HLD, DM type II, CKD stage III and sarcoidosis; who presents from home with complaints of multiple falls and being acutely AMS. Found to be septic with influenza, CAP, and small left lateral ventricle hemorrhage    Clinical Impression   This 83 yo female admitted with above presents to acute OT with deficits below (see OT problem list) thus affecting her PLOF of being totally mobile at a RW level pta and able to self toilet. She will benefit from acute OT with follow up OT at SNF to get back to her PLOF.    Follow Up Recommendations  SNF;Supervision/Assistance - 24 hour    Equipment Recommendations  Other (comment) (TBD at next venue)       Precautions / Restrictions Precautions Precautions: Fall Restrictions Weight Bearing Restrictions: No      Mobility Bed Mobility Overal bed mobility: Needs Assistance Bed Mobility: Supine to Sit     Supine to sit: Mod assist;HOB elevated     General bed mobility comments: cues for sequencing and use of rail  Transfers Overall transfer level: Needs assistance Equipment used: Rolling walker (2 wheeled) Transfers: Sit to/from UGI CorporationStand;Stand Pivot Transfers Sit to Stand: Mod assist Stand pivot transfers: Max assist            Balance Overall balance assessment: Needs assistance Sitting-balance support: Bilateral upper extremity supported;Feet supported Sitting balance-Leahy Scale: Poor   Postural control: Posterior lean Standing balance support: Bilateral upper extremity supported;During functional activity Standing balance-Leahy Scale: Poor                              ADL Overall ADL's : Needs assistance/impaired Eating/Feeding: Independent (sitting in recliner)   Grooming:  Supervision/safety;Set up (sitting in recliner)   Upper Body Bathing: Set up;Supervision/ safety (sitting in recliner)   Lower Body Bathing: Maximal assistance (Mod A sit<>stand)   Upper Body Dressing : Maximal assistance (supported sitting)   Lower Body Dressing: Total assistance (max A sit<>stand)   Toilet Transfer: Moderate assistance;Stand-pivot;RW;BSC   Toileting- Clothing Manipulation and Hygiene: Maximal assistance (Mod A sit<>stand)                         Pertinent Vitals/Pain Pain Assessment: Faces Faces Pain Scale: Hurts little more Pain Location: LLE at ankle Pain Descriptors / Indicators: Cramping Pain Intervention(s): Monitored during session;Other (comment) (massage)     Hand Dominance Right   Extremity/Trunk Assessment Upper Extremity Assessment Upper Extremity Assessment: Generalized weakness   Lower Extremity Assessment Lower Extremity Assessment: Defer to PT evaluation       Communication Communication Communication: No difficulties   Cognition Arousal/Alertness: Awake/alert Behavior During Therapy: Anxious (about being up on her feet and being on BSC and not in bathroom (not safe to ambulate her there)) Overall Cognitive Status: Impaired/Different from baseline Area of Impairment: Safety/judgement;Problem solving         Safety/Judgement: Decreased awareness of safety;Decreased awareness of deficits   Problem Solving: Difficulty sequencing;Requires verbal cues;Requires tactile cues General Comments: She is asked me to give her the aspirin that is right over there, when asked where she is she says the hospital              Home Living Family/patient  expects to be discharged to:: Private residence Living Arrangements: Children Available Help at Discharge: Family;Available 24 hours/day                         Home Equipment: Walker - 4 wheels          Prior Functioning/Environment Level of Independence: Needs  assistance  Gait / Transfers Assistance Needed: Pt walked around house with 4 wheeled RW by herself ADL's / Homemaking Assistance Needed: Pt was able to do her own toileting, dtrs helped with B/D every morning; family ( 6 grown kids help with IADLs)            OT Problem List: Decreased strength;Impaired balance (sitting and/or standing);Decreased cognition;Decreased safety awareness   OT Treatment/Interventions: Self-care/ADL training;Patient/family education;Balance training;Therapeutic activities    OT Goals(Current goals can be found in the care plan section) Acute Rehab OT Goals Patient Stated Goal: to be able to go home OT Goal Formulation: With patient/family Time For Goal Achievement: 03/30/16 Potential to Achieve Goals: Good  OT Frequency: Min 2X/week              End of Session Equipment Utilized During Treatment: Gait belt;Rolling walker Nurse Communication: Mobility status  Activity Tolerance: Patient tolerated treatment well Patient left: in chair;with call bell/phone within reach;with chair alarm set;with family/visitor present   Time: 1449-1534 OT Time Calculation (min): 45 min Charges:  OT General Charges $OT Visit: 1 Procedure OT Evaluation $OT Eval Moderate Complexity: 1 Procedure OT Treatments $Self Care/Home Management : 23-37 mins  Evette Georges [phone] 03/16/2016, 3:54 PM

## 2016-03-17 ENCOUNTER — Inpatient Hospital Stay (HOSPITAL_COMMUNITY): Payer: Medicare Other

## 2016-03-17 DIAGNOSIS — I5030 Unspecified diastolic (congestive) heart failure: Secondary | ICD-10-CM

## 2016-03-17 DIAGNOSIS — R0600 Dyspnea, unspecified: Secondary | ICD-10-CM

## 2016-03-17 LAB — GLUCOSE, CAPILLARY
GLUCOSE-CAPILLARY: 124 mg/dL — AB (ref 65–99)
GLUCOSE-CAPILLARY: 130 mg/dL — AB (ref 65–99)
GLUCOSE-CAPILLARY: 157 mg/dL — AB (ref 65–99)
GLUCOSE-CAPILLARY: 235 mg/dL — AB (ref 65–99)
Glucose-Capillary: 136 mg/dL — ABNORMAL HIGH (ref 65–99)
Glucose-Capillary: 200 mg/dL — ABNORMAL HIGH (ref 65–99)
Glucose-Capillary: 235 mg/dL — ABNORMAL HIGH (ref 65–99)
Glucose-Capillary: 226 mg/dL — ABNORMAL HIGH (ref 65–99)

## 2016-03-17 LAB — CBC WITH DIFFERENTIAL/PLATELET
BASOS PCT: 0 %
Basophils Absolute: 0 10*3/uL (ref 0.0–0.1)
EOS ABS: 0.3 10*3/uL (ref 0.0–0.7)
EOS PCT: 6 %
HCT: 34.2 % — ABNORMAL LOW (ref 36.0–46.0)
Hemoglobin: 11.3 g/dL — ABNORMAL LOW (ref 12.0–15.0)
Lymphocytes Relative: 14 %
Lymphs Abs: 0.8 10*3/uL (ref 0.7–4.0)
MCH: 30.5 pg (ref 26.0–34.0)
MCHC: 33 g/dL (ref 30.0–36.0)
MCV: 92.2 fL (ref 78.0–100.0)
Monocytes Absolute: 0.6 10*3/uL (ref 0.1–1.0)
Monocytes Relative: 11 %
Neutro Abs: 3.8 10*3/uL (ref 1.7–7.7)
Neutrophils Relative %: 69 %
PLATELETS: 151 10*3/uL (ref 150–400)
RBC: 3.71 MIL/uL — AB (ref 3.87–5.11)
RDW: 13.8 % (ref 11.5–15.5)
WBC: 5.5 10*3/uL (ref 4.0–10.5)

## 2016-03-17 LAB — COMPREHENSIVE METABOLIC PANEL
ALT: 27 U/L (ref 14–54)
ANION GAP: 14 (ref 5–15)
AST: 31 U/L (ref 15–41)
Albumin: 2.9 g/dL — ABNORMAL LOW (ref 3.5–5.0)
Alkaline Phosphatase: 79 U/L (ref 38–126)
BUN: 12 mg/dL (ref 6–20)
CHLORIDE: 99 mmol/L — AB (ref 101–111)
CO2: 27 mmol/L (ref 22–32)
Calcium: 9 mg/dL (ref 8.9–10.3)
Creatinine, Ser: 1.04 mg/dL — ABNORMAL HIGH (ref 0.44–1.00)
GFR calc Af Amer: 53 mL/min — ABNORMAL LOW (ref >60)
GFR calc non Af Amer: 46 mL/min — ABNORMAL LOW (ref >60)
Glucose, Bld: 136 mg/dL — ABNORMAL HIGH (ref 65–99)
Potassium: 3.5 mmol/L (ref 3.5–5.1)
SODIUM: 140 mmol/L (ref 135–145)
Total Bilirubin: 0.5 mg/dL (ref 0.3–1.2)
Total Protein: 5.7 g/dL — ABNORMAL LOW (ref 6.5–8.1)

## 2016-03-17 LAB — ECHOCARDIOGRAM COMPLETE
HEIGHTINCHES: 67 in
WEIGHTICAEL: 2400 [oz_av]

## 2016-03-17 LAB — PHOSPHORUS: PHOSPHORUS: 3.4 mg/dL (ref 2.5–4.6)

## 2016-03-17 LAB — MAGNESIUM: Magnesium: 2.1 mg/dL (ref 1.7–2.4)

## 2016-03-17 MED ORDER — PERFLUTREN LIPID MICROSPHERE
1.0000 mL | INTRAVENOUS | Status: AC | PRN
  Administered 2016-03-17: 2 mL via INTRAVENOUS
  Filled 2016-03-17: qty 10

## 2016-03-17 NOTE — Evaluation (Signed)
Physical Therapy Evaluation Patient Details Name: Elizabeth Keller MRN: [id number] DOB: 12-06-1923 Today's Date: 03/17/2016   History of Present Illness  Elizabeth Keller is a 83 y.o. female with medical history significant of HTN, HLD, DM type II, CKD stage III and sarcoidosis; who presents from home with complaints of multiple falls and being acutely AMS. Found to be septic with influenza, CAP, and small left lateral ventricle hemorrhage   Clinical Impression  Pt pleasantly confused, aware of date and has difficulty arriving at birthdate but able to state with increased time. Pt normally walking limited distance at home with rollator but currently mod-max assist for all mobility and only able to pivot to chair. Pt with significant weakness limiting all mobility, impaired ability for transfers, gait and functional mobility who will benefit from acute therapy to maximize mobility, function and independence to decrease burden of care.     Follow Up Recommendations SNF;Supervision/Assistance - 24 hour    Equipment Recommendations       Recommendations for Other Services       Precautions / Restrictions Precautions Precautions: Fall Restrictions Weight Bearing Restrictions: No      Mobility  Bed Mobility Overal bed mobility: Needs Assistance Bed Mobility: Rolling;Sidelying to Sit Rolling: Max assist Sidelying to sit: Max assist       General bed mobility comments: max assist with hand over hand placement on rail and no initiation for rotation to roll. Assist to bring legs off of bed and elevate trunk  Transfers Overall transfer level: Needs assistance   Transfers: Sit to/from Stand;Squat Pivot Transfers Sit to Stand: Mod assist   Squat pivot transfers: Max assist     General transfer comment: pt with cues for hand placement, anterior translation and safety with assist to rise. Pt unable to take steps in standing, sat then rose again for squat pivot with bobath technique to chair  with max multimodal cueing  Ambulation/Gait                Stairs            Wheelchair Mobility    Modified Rankin (Stroke Patients Only)       Balance Overall balance assessment: Needs assistance   Sitting balance-Leahy Scale: Poor       Standing balance-Leahy Scale: Poor                               Pertinent Vitals/Pain Pain Score: 4  Pain Location: LLE Pain Descriptors / Indicators: Sore Pain Intervention(s): Limited activity within patient's tolerance;Repositioned;Monitored during session    Home Living Family/patient expects to be discharged to:: Skilled nursing facility Living Arrangements: Children Available Help at Discharge: Family;Available PRN/intermittently Type of Home: House Home Access: Stairs to enter     Home Layout: One level Home Equipment: Environmental consultant - 4 wheels      Prior Function Level of Independence: Needs assistance   Gait / Transfers Assistance Needed: Pt walked around house with 4 wheeled RW by herself grossly 30'  ADL's / Homemaking Assistance Needed: Pt was able to do her own toileting, dtrs helped with B/D every morning; family ( 6 grown kids help with IADLs)  Comments: daughter Elizabeth Keller POA present and states 24 hr sufficient care not currently available and she is agreeable to SNF     Hand Dominance        Extremity/Trunk Assessment   Upper Extremity Assessment Upper Extremity Assessment: Generalized weakness  Lower Extremity Assessment Lower Extremity Assessment: Generalized weakness    Cervical / Trunk Assessment Cervical / Trunk Assessment: Kyphotic  Communication   Communication: No difficulties  Cognition Arousal/Alertness: Awake/alert Behavior During Therapy: Flat affect Overall Cognitive Status: Impaired/Different from baseline Area of Impairment: Safety/judgement;Problem solving;Orientation Orientation Level: Place       Safety/Judgement: Decreased awareness of  safety;Decreased awareness of deficits   Problem Solving: Difficulty sequencing;Requires verbal cues;Requires tactile cues General Comments: pt states she is in [place], unable to recognize where chair is in the room, difficulty following commands    General Comments      Exercises     Assessment/Plan    PT Assessment Patient needs continued PT services  PT Problem List Decreased strength;Decreased mobility;Decreased safety awareness;Decreased activity tolerance;Decreased cognition;Decreased balance;Decreased knowledge of use of DME;Decreased coordination          PT Treatment Interventions Gait training;Therapeutic exercise;Patient/family education;Balance training;Functional mobility training;DME instruction;Therapeutic activities    PT Goals (Current goals can be found in the Care Plan section)  Acute Rehab PT Goals Patient Stated Goal: return home PT Goal Formulation: With patient/family Time For Goal Achievement: 03/31/16 Potential to Achieve Goals: Fair    Frequency Min 3X/week   Barriers to discharge Decreased caregiver support      Co-evaluation               End of Session Equipment Utilized During Treatment: Gait belt Activity Tolerance: Patient tolerated treatment well Patient left: in chair;with chair alarm set;with call bell/phone within reach;with family/visitor present Nurse Communication: Mobility status;Precautions         Time: [id number] PT Time Calculation (min) (ACUTE ONLY): 18 min   Charges:   PT Evaluation $PT Eval Moderate Complexity: 1 Procedure     PT G Codes:        Oree Mirelez B Franz Svec 03/17/2016, 1:15 PM  Delaney MeigsMaija Tabor Glanda Spanbauer, PT [phone]

## 2016-03-17 NOTE — NC FL2 (Signed)
[place] MEDICAID FL2 LEVEL OF CARE SCREENING TOOL     IDENTIFICATION  Patient Name: Elizabeth Keller Birthdate: Jul 07, 1923 Sex: female Admission Date (Current Location): 03/14/2016  [facility] and [place] Number:  Producer, television/film/video and Address:  [facility] [place], [address] [place], [place] [address]      Provider Number: [id number]  Attending Physician Name and Address:  Merlene Laughter, DO  Relative Name and Phone Number:       Current Level of Care: Hospital Recommended Level of Care: Skilled Nursing Facility Prior Approval Number:    Date Approved/Denied:   PASRR Number: [id number]  Discharge Plan: Home    Current Diagnoses: Patient Active Problem List   Diagnosis Date Noted  . Sepsis (HCC) 03/14/2016  . Influenza A 03/14/2016  . CAP (community acquired pneumonia) 03/14/2016  . ICH (intracerebral hemorrhage) (HCC) 03/14/2016  . Diabetes mellitus type II, controlled (HCC) 03/14/2016  . Chronic kidney disease, stage III (moderate) 03/14/2016  . Anemia 03/14/2016  . Hypertension 03/14/2016    Orientation RESPIRATION BLADDER Height & Weight     Self, Time, Situation, Place  Normal Incontinent Weight: 150 lb (68 kg) Height:  5\' 7"  (170.2 cm)  BEHAVIORAL SYMPTOMS/MOOD NEUROLOGICAL BOWEL NUTRITION STATUS      Continent Diet (see DC summary)  AMBULATORY STATUS COMMUNICATION OF NEEDS Skin   Extensive Assist Verbally Normal                       Personal Care Assistance Level of Assistance  Bathing, Dressing Bathing Assistance: Maximum assistance   Dressing Assistance: Maximum assistance     Functional Limitations Info             SPECIAL CARE FACTORS FREQUENCY  PT (By licensed PT), OT (By licensed OT)     PT Frequency: 5/wk OT Frequency: 5/wk            Contractures      Additional Factors Info  Code Status, Allergies, Isolation Precautions, Insulin Sliding Scale, Psychotropic Code Status Info: DNR Allergies  Info: Dicyclomine, Gabapentin, Metformin And Related, Lyrica Pregabalin, Topiramate Psychotropic Info: cymbalta Insulin Sliding Scale Info: 6/day Isolation Precautions Info: droplet     Current Medications (03/17/2016):  This is the current hospital active medication list Current Facility-Administered Medications  Medication Dose Route Frequency Provider Last Rate Last Dose  . acetaminophen (TYLENOL) tablet 650 mg  650 mg Oral Q6H PRN Clydie Braun, MD   650 mg at 03/16/16 1922   Or  . acetaminophen (TYLENOL) suppository 650 mg  650 mg Rectal Q6H PRN Clydie Braun, MD      . acidophilus (RISAQUAD) capsule 1 capsule  1 capsule Oral Daily Clydie Braun, MD   1 capsule at 03/16/16 0901  . aspirin EC tablet 81 mg  81 mg Oral Daily Rondell Burtis Junes, MD   81 mg at 03/16/16 0900  . azithromycin (ZITHROMAX) tablet 250 mg  250 mg Oral Daily Clydie Braun, MD   250 mg at 03/16/16 0901  . bismuth subsalicylate (PEPTO BISMOL) 262 MG/15ML suspension 30 mL  30 mL Oral Q6H PRN Clydie Braun, MD      . cefTRIAXone (ROCEPHIN) 1 g in dextrose 5 % 50 mL IVPB  1 g Intravenous Q24H Clydie Braun, MD   1 g at 03/16/16 1840  . cholecalciferol (VITAMIN D) tablet 1,000 Units  1,000 Units Oral Daily Clydie Braun, MD   1,000 Units  at 03/16/16 0900  . clonazePAM (KLONOPIN) tablet 0.25-0.5 mg  0.25-0.5 mg Oral BID PRN Clydie Braunondell A [name], MD   0.5 mg at 03/16/16 1922  . DULoxetine (CYMBALTA) DR capsule 60 mg  60 mg Oral Daily Clydie Braunondell A [name], MD   60 mg at 03/16/16 0900  . guaiFENesin (MUCINEX) 12 hr tablet 600 mg  600 mg Oral BID Clydie Braunondell A [name], MD   600 mg at 03/16/16 2124  . insulin aspart (novoLOG) injection 0-9 Units  0-9 Units Subcutaneous Q4H Clydie Braunondell A [name], MD   1 Units at 03/17/16 0511  . ipratropium-albuterol (DUONEB) 0.5-2.5 (3) MG/3ML nebulizer solution 3 mL  3 mL Nebulization Q4H PRN Omair Latif Sheikh, DO      . levalbuterol (XOPENEX) nebulizer solution 0.63 mg  0.63 mg Nebulization Q4H PRN Rondell  A Katrinka BlazingSmith, MD      . lidocaine (LIDODERM) 5 % 1 patch  1 patch Transdermal Daily PRN Clydie Braunondell A [name], MD      . magnesium oxide (MAG-OX) tablet 400 mg  400 mg Oral Daily Rondell Burtis JunesA [name], MD   400 mg at 03/16/16 0900  . megestrol (MEGACE) tablet 20 mg  20 mg Oral Daily Clydie Braunondell A [name], MD   20 mg at 03/16/16 [id number]  . mirtazapine (REMERON) tablet 30 mg  30 mg Oral QHS Clydie Braunondell A [name], MD   30 mg at 03/16/16 2124  . NIFEdipine (PROCARDIA-XL/ADALAT CC) 24 hr tablet 30 mg  30 mg Oral Daily Clydie Braunondell A [name], MD   30 mg at 03/16/16 [id number]  . ondansetron (ZOFRAN) tablet 4 mg  4 mg Oral Q6H PRN Clydie Braunondell A [name], MD       Or  . ondansetron (ZOFRAN) injection 4 mg  4 mg Intravenous Q6H PRN Clydie Braunondell A [name], MD      . oseltamivir (TAMIFLU) capsule 30 mg  30 mg Oral BID Clydie Braunondell A [name], MD   30 mg at 03/16/16 2124  . pantoprazole (PROTONIX) EC tablet 40 mg  40 mg Oral Daily Clydie Braunondell A [name], MD   40 mg at 03/16/16 0901  . polyethylene glycol (MIRALAX / GLYCOLAX) packet 17 g  17 g Oral Daily Clydie Braunondell A [name], MD   17 g at 03/16/16 0858  . potassium chloride SA (K-DUR,KLOR-CON) CR tablet 20 mEq  20 mEq Oral Daily Clydie Braunondell A [name], MD   20 mEq at 03/16/16 0901  . simvastatin (ZOCOR) tablet 40 mg  40 mg Oral QPM Clydie Braunondell A [name], MD   40 mg at 03/16/16 1840  . torsemide (DEMADEX) tablet 5 mg  5 mg Oral Daily Clydie Braunondell A [name], MD   5 mg at 03/16/16 0500     Discharge Medications: Please see discharge summary for a list of discharge medications.  Relevant Imaging Results:  Relevant Lab Results:   Additional Information SS#: [id number]  Burna SisUris, Gokul Waybright H, LCSW

## 2016-03-17 NOTE — Care Management Important Message (Signed)
Important Message  Patient Details  Name: Elizabeth RoupVerna Keller MRN: [id number] Date of Birth: [birth year]   Medicare Important Message Given:  Yes    Lawerance Sabalebbie Sharif Rendell, RN 03/17/2016, 1:13 PM

## 2016-03-17 NOTE — Progress Notes (Signed)
CSW had spoken with [name] this morning who claimed she was patients POA and HCPOA- she [place] CSW review.  Paperwork states that [name] does have power to make medical decisions if there is not an otherwise appointed HCPOA in which case the appointed HCPOA makes medical decisions- we have record to pt other dtr [name] having HCPOA.  CSW contacted [name] to discussed- she confirms she is HCPOA and would like to take patient home with home services at time of DC.  CSW updated MD and RNCM  CSW signing off  Burna SisJenna H. Danyella Mcginty, LCSW Clinical Social Worker [phone]

## 2016-03-17 NOTE — Progress Notes (Signed)
PROGRESS NOTE    Elizabeth Keller  GDJ:[id number] DOB: 12-08-1923 DOA: 03/14/2016 PCP: Reeves Dam, MD  Brief Narrative:  Elizabeth Keller is a 83 y.o. female with medical history significant of HTN, HLD, DM type II, CKD stage III and sarcoidosis; who presents from home with complaints of multiple falls and being acutely altered. At baseline the patient is noted to be ambulatory with walker and of sound mind. Patient is noted have somewhat of a shuffling gait. Sometime around 1 PM this afternoon she had 5 consecutive falls while trying to get up. Family noted that she was somewhat confused and her mental status, seemed to wax and wane. Other associated symptoms include chronic intermittent abdominal pain, constipation, leg swelling, decreased appetite, and productive cough(new). Patient had been taking over-the-counter cough medicine. Family denies knowledge of any fever, vomiting, chills, diarrhea, chest pain, or shortness of breath complaints. She was worked up and Code Sepsis was called and she received IVF, Azithromycin and Tamiflu. CT Scan of Brain revealed a mall left Lateral Ventricle hemorrhage. Neurosurgery was contacted and recommended Neurochecks and to repeat CT Scan this Am. TRH called to admit and patient and she was found to be Influenza Positive and had suspected PNA. Also had ECHO which showed Grade 1 Diastolic Dysfunction. PT evaluated and recommends SNF however daughter wants to take her Home with Home Health.   Assessment & Plan:   Principal Problem:   Sepsis ([place]) Active Problems:   Influenza A   CAP (community acquired pneumonia)   ICH (intracerebral hemorrhage) ([place])   Diabetes mellitus type II, controlled ([place])   Chronic kidney disease, stage III (moderate)   Anemia   Hypertension  SIRS 2/2 to suspected Community-acquired pneumonia and Influenza A: -Acute. Patient presents altered with fever, tachycardia, and tachypnea. SIRS criteria are met. Lactic acid noted to be  reassuring at 1.69. Chest x-ray revealing possible pneumonia. Sepsis protocol was initiated. - Stable for Telemetry - WBC not elevated and Afebrile;  WBC was 5.5 and Temperature was 98.8 today - Procalcitonin 0.22; Lactic Acid Level was 1.1 - Sepsis order set initiated; Repeat CXR in AM - C/w Droplet precautions now that + for Influenza - Follow-up cultures showed NGTD at 2 days; Urine Cx <10,000  - Breathing Tx with Levalbuterol q4hprn and with DuoNeb 3 mL q4hprn  - Continue empiric antibiotics of Rocephin and azithromycin per pharmacy for suspected CAP - Will continue Tamiflu as patient is positive for Influenza A - Mucinex 600 mg po BID - Tylenol prn fever - PT/OT Consulted and Recommend SNF; However Daughter and HCPOA wants to take her home with Home Health.  Traumatic ICH from Fall:  -Acute. Patient found to have a small left lateral ventricle hemorrhage.  -Neurosurgery consult and recommended repeat CT scan in a.m. and frequent neuro checks - PT/INR was 15.8/1.25 - neuro checks to 2 hours 6 - Goal systolic blood pressure less than 140 - Repeat CT Scan showed The blood seen in the left lateral ventricle previously is stable in overall quantity. However, there has been some redistribution from the body of the left lateral ventricle into the posterior horn and perhaps into the suprasellar cistern. No increased hemorrhage today. - No focal deficits noted.  - PT/OT recommend SNF but daughter and HCPOA wants to take her home.   Diabetes Mellitus Type 2: -On admission patient's blood glucose noted to be elevated at 216. Suspect some aspect of acute stress. Patient home medications consist of glipizide and metformin. - Hypoglycemic protocol -  Hold glipizide and metformin  - CBGs with Sensitive Novolog SSI AC - CBG's ranging from 073-710  Diastolic CHF:  -Due to the patient's recent cough and lower extremity swelling seen on physical exam. - BNP 197.2 - Was given IV Lasix 20 mg for  1 dose on 03/16/16; - Continue to Monitor Volume Status; Appeared overloaded - ECHOCardiogram showed EF of 55-60% with hypokinesis of the apical mycoardium and doppler parameters that were consistent with abnormal ventricular relaxation Grade 1 Diastolic Dysfunction. - May need More IV lasix in addition to home Torsemide - Strict I's and O's, Daily weights  Essential Hypertension - Continue Nifedipine 30 mg po Daily and Torsemide 5 mg Daily  Anxiety - Continue Klonopin 0.25-0.5 mg po BID prn  Constipation: chronix - Continue MiraLAX prn - Consider need of enema    Chronic kidney disease stage III:  -Stable. Creatinine 1.04 - Continue to monitor  - C/w Demadex 5 mg po Daily  Sarcoidosis:  -She notes a history of pulmonary sarcoidosis but has not had any issues with this recently to their knowledge.   Anemia:  -Stable.  -Hemoglobin 11.7 on admission. Now 11.3 -Recheck CBC in a.m.  DVT prophylaxis: SCDs Code Status: DO NOT RESUSCITATE Family Communication: Discussed with Daughter at bedside Disposition Plan: Home with [place] if Stable.   Consultants:   Neurosurgery   Procedures: CT Scan ECHOCARDIOGRAM Study Conclusions  - Left ventricle: The cavity size was normal. Systolic function was   normal. The estimated ejection fraction was in the range of 55%   to 60%. There is hypokinesis of the apical myocardium. Doppler   parameters are consistent with abnormal left ventricular   relaxation (grade 1 diastolic dysfunction). - Aortic valve: Trileaflet; moderately thickened, moderately   calcified leaflets. - Mitral valve: Mild prolapse, involving the anterior leaflet. - Pulmonic valve: There was moderate regurgitation. - Pulmonary arteries: Systolic pressure was mildly increased. PA   peak pressure: 35 mm Hg (S).    Antimicrobials:  Anti-infectives    Start     Dose/Rate Route Frequency Ordered Stop   03/15/16 1800  cefTRIAXone (ROCEPHIN) 1 g in dextrose 5 %  50 mL IVPB     1 g 100 mL/hr over 30 Minutes Intravenous Every 24 hours 03/14/16 2052     03/15/16 1000  azithromycin (ZITHROMAX) tablet 250 mg     250 mg Oral Daily 03/14/16 2052 03/19/16 0959   03/15/16 1000  oseltamivir (TAMIFLU) capsule 30 mg     30 mg Oral 2 times daily 03/14/16 2232 03/20/16 0959   03/14/16 1830  oseltamivir (TAMIFLU) capsule 30 mg     30 mg Oral  Once 03/14/16 1816 03/14/16 2208   03/14/16 1730  cefTRIAXone (ROCEPHIN) 1 g in dextrose 5 % 50 mL IVPB     1 g 100 mL/hr over 30 Minutes Intravenous  Once 03/14/16 1715 03/14/16 2119   03/14/16 1730  azithromycin (ZITHROMAX) tablet 500 mg     500 mg Oral  Once 03/14/16 1715 03/14/16 1739     Subjective: Seen and examined at bedside and was getting ECHO done. States she still feels bad and is coughing. Daughter noticed less swelling. No other complaints or concerns and daughter wants to take her home rather than going to SNF.   Objective: Vitals:   03/17/16 1551 03/17/16 1723 03/17/16 1801 03/17/16 2115  BP: (!) 86/54 130/70  129/74  Pulse: 94  88 92  Resp: 18   18  Temp: 98.6 F (37  C)   98.8 F (37.1 C)  TempSrc: Oral   Oral  SpO2: 96%   92%  Weight:      Height:        Intake/Output Summary (Last 24 hours) at 03/17/16 2135 Last data filed at 03/17/16 1859  Gross per 24 hour  Intake               50 ml  Output                0 ml  Net               50 ml   Filed Weights   03/14/16 1657  Weight: 68 kg (150 lb)   Examination: Physical Exam:  Constitutional: WN/WD obsese, NAD and appears calm and comfortable Eyes: Lids and conjunctivae normal, sclerae anicteric  ENMT: External Ears, Nose appear normal. Grossly normal hearing.  Neck: Appears normal, supple, no cervical masses, normal ROM, no appreciable thyromegaly Respiratory: Diminished to auscultation bilaterally, no wheezing, rales, rhonchi or crackles. Normal respiratory effort and patient is not tachypenic. No accessory muscle use. Has a wet  sounding cough.  Cardiovascular: RRR, no murmurs / rubs / gallops. S1 and S2 auscultated. 1+ lower extremity edema. Abdomen: Soft, non-tender, non-distended. No masses palpated. No appreciable hepatosplenomegaly. Bowel sounds positive x4 GU: Deferred. Musculoskeletal: No clubbing / cyanosis of digits/nails. No joint deformity upper and lower extremities.  Skin: No rashes, lesions, ulcers on limited skin eval. No induration; Warm and dry.  Neurologic: CN 2-12 grossly intact with no focal deficits. Sensation intact in all 4 Extremities,  Romberg sign cerebellar reflexes not assessed.  Psychiatric: Normal judgment and insight. Awake and Alert but not oriented x3. Normal mood and appropriate affect.   Data Reviewed: I have personally reviewed following labs and imaging studies  CBC:  Recent Labs Lab 03/14/16 1716 03/15/16 0413 03/16/16 0429 03/17/16 0607  WBC 7.4 6.4 5.3 5.5  NEUTROABS 5.7  --  3.8 3.8  HGB 11.7* 11.6* 11.6* 11.3*  HCT 35.3* 34.7* 34.8* 34.2*  MCV 92.9 92.5 92.6 92.2  PLT 153 158 152 856   Basic Metabolic Panel:  Recent Labs Lab 03/14/16 1716 03/15/16 0413 03/16/16 0429 03/17/16 0607  NA 136 139 138 140  K 4.0 4.3 4.3 3.5  CL 101 104 103 99*  CO2 24 26 26 27   GLUCOSE 216* 148* 137* 136*  BUN 12 11 13 12   CREATININE 1.21* 1.08* 1.02* 1.04*  CALCIUM 9.9 9.7 9.0 9.0  MG  --   --  2.2 2.1  PHOS  --   --  3.5 3.4   GFR: Estimated Creatinine Clearance: 35 mL/min (by C-G formula based on SCr of 1.04 mg/dL (H)). Liver Function Tests:  Recent Labs Lab 03/14/16 1716 03/16/16 0429 03/17/16 0607  AST 69* 51* 31  ALT 52 33 27  ALKPHOS 99 82 79  BILITOT 0.4 1.1 0.5  PROT 6.7 6.1* 5.7*  ALBUMIN 3.8 3.2* 2.9*   No results for input(s): LIPASE, AMYLASE in the last 168 hours. No results for input(s): AMMONIA in the last 168 hours. Coagulation Profile:  Recent Labs Lab 03/14/16 2122  INR 1.25   Cardiac Enzymes:  Recent Labs Lab 03/15/16 0413    TROPONINI <0.03   BNP (last 3 results) No results for input(s): PROBNP in the last 8760 hours. HbA1C: No results for input(s): HGBA1C in the last 72 hours. CBG:  Recent Labs Lab 03/17/16 0504 03/17/16 0825 03/17/16 1214 03/17/16 1702 03/17/16  2114  GLUCAP 130* 136* 200* 235* 226*   Lipid Profile: No results for input(s): CHOL, HDL, LDLCALC, TRIG, CHOLHDL, LDLDIRECT in the last 72 hours. Thyroid Function Tests: No results for input(s): TSH, T4TOTAL, FREET4, T3FREE, THYROIDAB in the last 72 hours. Anemia Panel: No results for input(s): VITAMINB12, FOLATE, FERRITIN, TIBC, IRON, RETICCTPCT in the last 72 hours. Sepsis Labs:  Recent Labs Lab 03/14/16 1729 03/14/16 2122 03/15/16 0413  PROCALCITON  --  0.22  --   LATICACIDVEN 1.69 1.0 1.1    Recent Results (from the past 240 hour(s))  Blood Culture (routine x 2)     Status: None (Preliminary result)   Collection Time: 03/14/16  5:22 PM  Result Value Ref Range Status   Specimen Description BLOOD RIGHT HAND  Final   Special Requests IN PEDIATRIC BOTTLE 2CC  Final   Culture NO GROWTH 3 DAYS  Final   Report Status PENDING  Incomplete  Blood Culture (routine x 2)     Status: None (Preliminary result)   Collection Time: 03/14/16  5:37 PM  Result Value Ref Range Status   Specimen Description BLOOD LEFT HAND  Final   Special Requests IN PEDIATRIC BOTTLE 2CC  Final   Culture NO GROWTH 3 DAYS  Final   Report Status PENDING  Incomplete  Urine culture     Status: Abnormal   Collection Time: 03/15/16  6:19 AM  Result Value Ref Range Status   Specimen Description URINE, CATHETERIZED  Final   Special Requests ADDED 0701  Final   Culture <10,000 COLONIES/mL INSIGNIFICANT GROWTH (A)  Final   Report Status 03/16/2016 FINAL  Final    Radiology Studies: No results found. Scheduled Meds: . acidophilus  1 capsule Oral Daily  . aspirin EC  81 mg Oral Daily  . azithromycin  250 mg Oral Daily  . cefTRIAXone (ROCEPHIN)  IV  1 g  Intravenous Q24H  . cholecalciferol  1,000 Units Oral Daily  . DULoxetine  60 mg Oral Daily  . guaiFENesin  600 mg Oral BID  . insulin aspart  0-9 Units Subcutaneous Q4H  . magnesium oxide  400 mg Oral Daily  . megestrol  20 mg Oral Daily  . mirtazapine  30 mg Oral QHS  . NIFEdipine  30 mg Oral Daily  . oseltamivir  30 mg Oral BID  . pantoprazole  40 mg Oral Daily  . polyethylene glycol  17 g Oral Daily  . potassium chloride SA  20 mEq Oral Daily  . simvastatin  40 mg Oral QPM  . torsemide  5 mg Oral Daily   Continuous Infusions:   LOS: 3 days   Kerney Elbe, DO [facility] Pager [phone]  If 7PM-7AM, please contact night-coverage [email or web address] Password [email or web address] 03/17/2016, 9:35 PM

## 2016-03-17 NOTE — Progress Notes (Signed)
Inpatient Diabetes Program Recommendations  AACE/ADA: New Consensus Statement on Inpatient Glycemic Control (2015)  Target Ranges:  Prepandial:   less than 140 mg/dL      Peak postprandial:   less than 180 mg/dL (1-2 hours)      Critically ill patients:  140 - 180 mg/dL   Lab Results  Component Value Date   GLUCAP 200 (H) 03/17/2016    Review of Glycemic Control Results for Ileana RoupERRELL, Elizabeth Keller (MRN [id number]) as of 03/17/2016 12:19  Ref. Range 03/16/2016 21:03 03/17/2016 00:31 03/17/2016 05:04 03/17/2016 08:25 03/17/2016 12:14  Glucose-Capillary Latest Ref Range: 65 - 99 mg/dL 253235 (H) 664124 (H) 403130 (H) 136 (H) 200 (H)   Diabetes history: DM2 Outpatient Diabetes medications: Glucotrol 10 mg am + 5 mg pm + Metformin 500 mg q am Current orders for Inpatient glycemic control: Novolog 0-9 units q 4 hrs.  Inpatient Diabetes Program Recommendations:   Noted A1c @ [facility] 8.8 on 12/20/15. Please consider on D/C: -D/C Glucotrol due to risk of hypoglycemia -D/C Metformin due to decreased GRF -Consider Prandin due to only taking if she eats  Thank you, Darel HongJudy E. Nabiha Planck, RN, MSN, CDE Inpatient Glycemic Control Team Team Pager [phone] (8am-5pm) 03/17/2016 12:36 PM

## 2016-03-17 NOTE — Clinical Social Work Note (Signed)
Clinical Social Work Assessment  Patient Details  Name: Elizabeth Keller MRN: [id number] Date of Birth: 1923/05/10  Date of referral:  03/17/16               Reason for consult:  Facility Placement                Permission sought to share information with:  Ocean[facility]ility Contact Representative Permission granted to share information::  Yes, Release of Information Signed  Name::     Elizabeth Keller, Music therapistLinda  Agency::  SNF  Relationship::  dtrs  Contact Information:     Housing/Transportation Living arrangements for the past 2 months:  Apartment Source of Information:  Adult Children Patient Interpreter Needed:  None Criminal Activity/Legal Involvement Pertinent to Current Situation/Hospitalization:  No - Comment as needed Significant Relationships:  Adult Children Lives with:  Adult Children (son) Do you feel safe going back to the place where you live?    Need for family participation in patient care:  Yes (Comment) (assistance with decision making and help with ADLs at home)  Care giving concerns:  Per pt dtr Elizabeth Keller pt lives at apartment with her son who is the primary caregiver.  Elizabeth Keller states that pt needs assistance with ADLs and depends on 3 adult dtrs for more personal care assistance (clothing, bathing, etc).  Elizabeth Keller states pt was able to mobilize at home with a walker and she would need to return to this level of functioning to return home safely.   Social Worker assessment / plan:  CSW spoke with pt dtr Elizabeth Keller concerning plan for pt at time of DC.  CSW discussed SNF and SNF referral process.  Elizabeth Keller feels strongly that pt would need 24 hour help at this time and does not think that this can be provided at home since pt son cannot assist with more personal care needs.  Employment status:   Retired Health and safety inspectornsurance information:  Medicare PT Recommendations:  Skilled Nursing Facility Information / Referral to community resources:  Skilled Nursing Facility  Patient/Family's Response to care:  Pt dtr Elizabeth Keller  is agreeable to SNF and hopeful for [facility] which is near their home.  Elizabeth Keller states she is POA and that her POA paperwork also specifies her as HCPOA- hospital currently has HCPOA paperwork stating that her sister Elizabeth Keller is HCPOA but Elizabeth Keller will bring in paperwork today stating otherwise.  Elizabeth Keller stating Elizabeth Keller is adamantly against SNF placement.  CSW to review paperwork and discuss with supervisor to see who would have decision making power if patient unable to make these decisions at this time.   Patient/Family's Understanding of and Emotional Response to Diagnosis, Current Treatment, and Prognosis:  Family is divided on what to do with patient at this time but all want what is best for her.  Emotional Assessment Appearance:  Appears stated age Attitude/Demeanor/Rapport:    Affect (typically observed):    Orientation:  Oriented to Self, Oriented to Place, Oriented to  Time, Oriented to Situation Alcohol / Substance use:  Not Applicable Psych involvement (Current and /or in the community):  No (Comment)  Discharge Needs  Concerns to be addressed:  Care Coordination Readmission within the last 30 days:  No Current discharge risk:  Physical Impairment Barriers to Discharge:  Continued Medical Work up   [employer], Elizabeth Yousuf H, LCSW 03/17/2016, 9:15 AM

## 2016-03-17 NOTE — Progress Notes (Signed)
  Echocardiogram 2D Echocardiogram with Definity has been performed.  Cathie BeamsGREGORY, Mazen Marcin 03/17/2016, 9:39 AM

## 2016-03-17 NOTE — Care Management Note (Addendum)
Case Management Note  Patient Details  Name: Elizabeth Keller MRN: [id number] Date of Birth: 08/30/1923  Subjective/Objective:                 Spoke with patient's daughter Allayne StackSiscero, [place] (referred by CSW). Patient and family would like to go home at DC. Daughter states she is familiar with [facility], and has used them in the past and would like to use them at DC. Also states that patient has a bedside commode, but would need a lightweight wheelchair and hospital bed.    Action/Plan:  Plan for DC to home, will need lightweight WC and hospital bed and HH orders with [facility]. [facility] for discharge anticipated Wednesday. Patient will DC with daughter Benay PikeSicero to [address] [place] [id number] 2/8 Update Lupita LeashDonna, [facility] clinical liaison that patient has DC order today. Patient's daughter on way to pick her up. Placed PTAR paperwork on front of chart, highlighted daughter's address. Explained to Sprint Nextel CorporationErin RN that patient may need PTAR and provided with number to call when she is ready for DC.   Expected Discharge Date:                  Expected Discharge Plan:  Home w Home Health Services  In-House Referral:  Clinical Social Work  Discharge planning Services  CM Consult  Post Acute Care Choice:    Choice offered to:     DME Arranged:    DME Agency:     HH Arranged:    HH Agency:  [facility]  Status of Service:  In process, will continue to follow  If discussed at Long Length of Stay Meetings, dates discussed:    Additional Comments:  Lawerance SabalDebbie Masha Orbach, RN 03/17/2016, 2:11 PM

## 2016-03-18 ENCOUNTER — Inpatient Hospital Stay (HOSPITAL_COMMUNITY): Payer: Medicare Other

## 2016-03-18 DIAGNOSIS — A419 Sepsis, unspecified organism: Principal | ICD-10-CM

## 2016-03-18 DIAGNOSIS — N183 Chronic kidney disease, stage 3 (moderate): Secondary | ICD-10-CM

## 2016-03-18 DIAGNOSIS — J101 Influenza due to other identified influenza virus with other respiratory manifestations: Secondary | ICD-10-CM

## 2016-03-18 LAB — COMPREHENSIVE METABOLIC PANEL
ALBUMIN: 2.9 g/dL — AB (ref 3.5–5.0)
ALT: 25 U/L (ref 14–54)
ANION GAP: 12 (ref 5–15)
AST: 28 U/L (ref 15–41)
Alkaline Phosphatase: 83 U/L (ref 38–126)
BILIRUBIN TOTAL: 0.5 mg/dL (ref 0.3–1.2)
BUN: 10 mg/dL (ref 6–20)
CO2: 29 mmol/L (ref 22–32)
Calcium: 8.9 mg/dL (ref 8.9–10.3)
Chloride: 99 mmol/L — ABNORMAL LOW (ref 101–111)
Creatinine, Ser: 1 mg/dL (ref 0.44–1.00)
GFR calc non Af Amer: 48 mL/min — ABNORMAL LOW (ref >60)
GFR, EST AFRICAN AMERICAN: 56 mL/min — AB (ref >60)
GLUCOSE: 168 mg/dL — AB (ref 65–99)
POTASSIUM: 4 mmol/L (ref 3.5–5.1)
SODIUM: 140 mmol/L (ref 135–145)
TOTAL PROTEIN: 5.9 g/dL — AB (ref 6.5–8.1)

## 2016-03-18 LAB — CBC WITH DIFFERENTIAL/PLATELET
BASOS PCT: 0 %
Basophils Absolute: 0 10*3/uL (ref 0.0–0.1)
EOS ABS: 0.3 10*3/uL (ref 0.0–0.7)
Eosinophils Relative: 6 %
HCT: 35.5 % — ABNORMAL LOW (ref 36.0–46.0)
Hemoglobin: 11.7 g/dL — ABNORMAL LOW (ref 12.0–15.0)
Lymphocytes Relative: 15 %
Lymphs Abs: 0.8 10*3/uL (ref 0.7–4.0)
MCH: 30.5 pg (ref 26.0–34.0)
MCHC: 33 g/dL (ref 30.0–36.0)
MCV: 92.7 fL (ref 78.0–100.0)
MONO ABS: 0.7 10*3/uL (ref 0.1–1.0)
Monocytes Relative: 14 %
Neutro Abs: 3.3 10*3/uL (ref 1.7–7.7)
Neutrophils Relative %: 65 %
Platelets: 161 10*3/uL (ref 150–400)
RBC: 3.83 MIL/uL — ABNORMAL LOW (ref 3.87–5.11)
RDW: 14.3 % (ref 11.5–15.5)
WBC: 5.1 10*3/uL (ref 4.0–10.5)

## 2016-03-18 LAB — GLUCOSE, CAPILLARY
GLUCOSE-CAPILLARY: 157 mg/dL — AB (ref 65–99)
GLUCOSE-CAPILLARY: 204 mg/dL — AB (ref 65–99)
Glucose-Capillary: 148 mg/dL — ABNORMAL HIGH (ref 65–99)
Glucose-Capillary: 111 mg/dL — ABNORMAL HIGH (ref 65–99)
Glucose-Capillary: 197 mg/dL — ABNORMAL HIGH (ref 65–99)

## 2016-03-18 LAB — PHOSPHORUS: PHOSPHORUS: 3.2 mg/dL (ref 2.5–4.6)

## 2016-03-18 LAB — MAGNESIUM: Magnesium: 2.3 mg/dL (ref 1.7–2.4)

## 2016-03-18 NOTE — Progress Notes (Signed)
PROGRESS NOTE    Elizabeth Keller  IRJ:[id number] DOB: Jul 19, 1923 DOA: 03/14/2016 PCP: Reeves Dam, MD  Brief Narrative:  Elizabeth Keller is a 83 y.o. female with medical history significant of HTN, HLD, DM type II, CKD stage III and sarcoidosis; who presents from home with complaints of multiple falls and being acutely altered. At baseline the patient is noted to be ambulatory with walker and of sound mind. Patient is noted have somewhat of a shuffling gait. Sometime around 1 PM this afternoon she had 5 consecutive falls while trying to get up. Family noted that she was somewhat confused and her mental status, seemed to wax and wane. Other associated symptoms include chronic intermittent abdominal pain, constipation, leg swelling, decreased appetite, and productive cough(new). Patient had been taking over-the-counter cough medicine. Family denies knowledge of any fever, vomiting, chills, diarrhea, chest pain, or shortness of breath complaints. She was worked up and Code Sepsis was called and she received IVF, Azithromycin and Tamiflu. CT Scan of Brain revealed a mall left Lateral Ventricle hemorrhage. Neurosurgery was contacted and recommended Neurochecks and to repeat CT Scan this Am. TRH called to admit and patient and she was found to be Influenza Positive and had suspected PNA. Also had ECHO which showed Grade 1 Diastolic Dysfunction. PT evaluated and recommends SNF however daughter wants to take her Home with Home Health.   Assessment & Plan:   Principal Problem:   Sepsis ([place]) Active Problems:   Influenza A   CAP (community acquired pneumonia)   ICH (intracerebral hemorrhage) ([place])   Diabetes mellitus type II, controlled ([place])   Chronic kidney disease, stage III (moderate)   Anemia   Hypertension  SIRS 2/2 to suspected Community-acquired pneumonia and Influenza A: -Acute. Patient presents altered with fever, tachycardia, and tachypnea. SIRS criteria are met. Lactic acid noted to be  reassuring at 1.69. Chest x-ray revealing possible pneumonia. Sepsis protocol was initiated. - Stable for Telemetry - WBC not elevated and Afebrile;  WBC was 5.5 and Temperature was 98.8 today - Procalcitonin 0.22; Lactic Acid Level was 1.1 - Sepsis order set initiated; Repeat CXR in AM - C/w Droplet precautions now that + for Influenza - Follow-up cultures showed NGTD at 2 days; Urine Cx <10,000  - Breathing Tx with Levalbuterol q4hprn and with DuoNeb 3 mL q4hprn  - Continue empiric antibiotics of Rocephin and azithromycin per pharmacy for suspected CAP - Will continue Tamiflu as patient is positive for Influenza A - Mucinex 600 mg po BID - Tylenol prn fever - PT/OT Consulted and Recommend SNF; However Daughter and HCPOA wants to take her home with Home Health. Will have swallow eval, patient look very weak  Traumatic ICH from Fall:  -Acute. Patient found to have a small left lateral ventricle hemorrhage.  -Neurosurgery consult and recommended repeat CT scan in a.m. and frequent neuro checks - PT/INR was 15.8/1.25 - neuro checks to 2 hours 6 - Goal systolic blood pressure less than 140 - Repeat CT Scan showed The blood seen in the left lateral ventricle previously is stable in overall quantity. However, there has been some redistribution from the body of the left lateral ventricle into the posterior horn and perhaps into the suprasellar cistern. No increased hemorrhage today. - No focal deficits noted.  - PT/OT recommend SNF but daughter and HCPOA wants to take her home.   Diabetes Mellitus Type 2: -On admission patient's blood glucose noted to be elevated at 216. Suspect some aspect of acute stress. Patient home medications  consist of glipizide and metformin. - Hypoglycemic protocol - Hold glipizide and metformin  - CBGs with Sensitive Novolog SSI AC - CBG's ranging from 737-106  Diastolic CHF:  -Due to the patient's recent cough and lower extremity swelling seen on physical  exam. - BNP 197.2 - Was given IV Lasix 20 mg for 1 dose on 03/16/16; - Continue to Monitor Volume Status; Appeared overloaded - ECHOCardiogram showed EF of 55-60% with hypokinesis of the apical mycoardium and doppler parameters that were consistent with abnormal ventricular relaxation Grade 1 Diastolic Dysfunction. - May need More IV lasix in addition to home Torsemide - Strict I's and O's, Daily weights  Essential Hypertension - Continue Nifedipine 30 mg po Daily and Torsemide 5 mg Daily  Anxiety - Continue Klonopin 0.25-0.5 mg po BID prn  Constipation: chronix - Continue MiraLAX prn - Consider need of enema    Chronic kidney disease stage III:  -Stable. Creatinine 1.04 - Continue to monitor  - C/w Demadex 5 mg po Daily  Sarcoidosis:  -She notes a history of pulmonary sarcoidosis but has not had any issues with this recently to their knowledge.  Chronic changes on cxr  Anemia:  -Stable.  -Hemoglobin 11.7 on admission. Now 11.3 -Recheck CBC in a.m.  DVT prophylaxis: SCDs Code Status: DO NOT RESUSCITATE Family Communication: Discussed with Daughter at bedside Disposition Plan: Home with [place] if Stable, likely on 2/8  Consultants:   Neurosurgery   Procedures: CT Scan ECHOCARDIOGRAM Study Conclusions  - Left ventricle: The cavity size was normal. Systolic function was   normal. The estimated ejection fraction was in the range of 55%   to 60%. There is hypokinesis of the apical myocardium. Doppler   parameters are consistent with abnormal left ventricular   relaxation (grade 1 diastolic dysfunction). - Aortic valve: Trileaflet; moderately thickened, moderately   calcified leaflets. - Mitral valve: Mild prolapse, involving the anterior leaflet. - Pulmonic valve: There was moderate regurgitation. - Pulmonary arteries: Systolic pressure was mildly increased. PA   peak pressure: 35 mm Hg (S).    Antimicrobials:  Anti-infectives    Start     Dose/Rate  Route Frequency Ordered Stop   03/15/16 1800  cefTRIAXone (ROCEPHIN) 1 g in dextrose 5 % 50 mL IVPB     1 g 100 mL/hr over 30 Minutes Intravenous Every 24 hours 03/14/16 2052     03/15/16 1000  azithromycin (ZITHROMAX) tablet 250 mg     250 mg Oral Daily 03/14/16 2052 03/18/16 1024   03/15/16 1000  oseltamivir (TAMIFLU) capsule 30 mg     30 mg Oral 2 times daily 03/14/16 2232 03/20/16 0959   03/14/16 1830  oseltamivir (TAMIFLU) capsule 30 mg     30 mg Oral  Once 03/14/16 1816 03/14/16 2208   03/14/16 1730  cefTRIAXone (ROCEPHIN) 1 g in dextrose 5 % 50 mL IVPB     1 g 100 mL/hr over 30 Minutes Intravenous  Once 03/14/16 1715 03/14/16 2119   03/14/16 1730  azithromycin (ZITHROMAX) tablet 500 mg     500 mg Oral  Once 03/14/16 1715 03/14/16 1739     Subjective: Seen and examined at bedside and was getting ECHO done. States she still feels bad and is coughing. Daughter noticed less swelling. No other complaints or concerns and daughter wants to take her home rather than going to SNF.   Objective: Vitals:   03/17/16 1723 03/17/16 1801 03/17/16 2115 03/18/16 0406  BP: 130/70  129/74 135/70  Pulse:  88 92 (!) 101  Resp:   18 19  Temp:   98.8 F (37.1 C) 100.1 F (37.8 C)  TempSrc:   Oral Oral  SpO2:   92% 93%  Weight:      Height:        Intake/Output Summary (Last 24 hours) at 03/18/16 1058 Last data filed at 03/17/16 1859  Gross per 24 hour  Intake               50 ml  Output                0 ml  Net               50 ml   Filed Weights   03/14/16 1657  Weight: 68 kg (150 lb)   Examination: Physical Exam:  Constitutional: WN/WD obsese, NAD and appears calm and comfortable Eyes: Lids and conjunctivae normal, sclerae anicteric  ENMT: External Ears, Nose appear normal. Grossly normal hearing.  Neck: Appears normal, supple, no cervical masses, normal ROM, no appreciable thyromegaly Respiratory: Diminished to auscultation bilaterally, no wheezing, rales, rhonchi or crackles.  Normal respiratory effort and patient is not tachypenic. No accessory muscle use. Has a wet sounding cough.  Cardiovascular: RRR, no murmurs / rubs / gallops. S1 and S2 auscultated. 1+ lower extremity edema. Abdomen: Soft, non-tender, non-distended. No masses palpated. No appreciable hepatosplenomegaly. Bowel sounds positive x4 GU: Deferred. Musculoskeletal: No clubbing / cyanosis of digits/nails. No joint deformity upper and lower extremities.  Skin: No rashes, lesions, ulcers on limited skin eval. No induration; Warm and dry.  Neurologic: CN 2-12 grossly intact with no focal deficits. Sensation intact in all 4 Extremities,  Romberg sign cerebellar reflexes not assessed.  Psychiatric: Normal judgment and insight. Awake and Alert but not oriented x3. Normal mood and appropriate affect.   Data Reviewed: I have personally reviewed following labs and imaging studies  CBC:  Recent Labs Lab 03/14/16 1716 03/15/16 0413 03/16/16 0429 03/17/16 0607 03/18/16 0612  WBC 7.4 6.4 5.3 5.5 5.1  NEUTROABS 5.7  --  3.8 3.8 3.3  HGB 11.7* 11.6* 11.6* 11.3* 11.7*  HCT 35.3* 34.7* 34.8* 34.2* 35.5*  MCV 92.9 92.5 92.6 92.2 92.7  PLT 153 158 152 151 628   Basic Metabolic Panel:  Recent Labs Lab 03/14/16 1716 03/15/16 0413 03/16/16 0429 03/17/16 0607 03/18/16 0612  NA 136 139 138 140 140  K 4.0 4.3 4.3 3.5 4.0  CL 101 104 103 99* 99*  CO2 24 26 26 27 29   GLUCOSE 216* 148* 137* 136* 168*  BUN 12 11 13 12 10   CREATININE 1.21* 1.08* 1.02* 1.04* 1.00  CALCIUM 9.9 9.7 9.0 9.0 8.9  MG  --   --  2.2 2.1 2.3  PHOS  --   --  3.5 3.4 3.2   GFR: Estimated Creatinine Clearance: 36.4 mL/min (by C-G formula based on SCr of 1 mg/dL). Liver Function Tests:  Recent Labs Lab 03/14/16 1716 03/16/16 0429 03/17/16 0607 03/18/16 0612  AST 69* 51* 31 28  ALT 52 33 27 25  ALKPHOS 99 82 79 83  BILITOT 0.4 1.1 0.5 0.5  PROT 6.7 6.1* 5.7* 5.9*  ALBUMIN 3.8 3.2* 2.9* 2.9*   No results for input(s):  LIPASE, AMYLASE in the last 168 hours. No results for input(s): AMMONIA in the last 168 hours. Coagulation Profile:  Recent Labs Lab 03/14/16 2122  INR 1.25   Cardiac Enzymes:  Recent Labs Lab 03/15/16 0413  TROPONINI <0.03  BNP (last 3 results) No results for input(s): PROBNP in the last 8760 hours. HbA1C: No results for input(s): HGBA1C in the last 72 hours. CBG:  Recent Labs Lab 03/17/16 1702 03/17/16 2114 03/17/16 2358 03/18/16 0401 03/18/16 0808  GLUCAP 235* 226* 157* 157* 148*   Lipid Profile: No results for input(s): CHOL, HDL, LDLCALC, TRIG, CHOLHDL, LDLDIRECT in the last 72 hours. Thyroid Function Tests: No results for input(s): TSH, T4TOTAL, FREET4, T3FREE, THYROIDAB in the last 72 hours. Anemia Panel: No results for input(s): VITAMINB12, FOLATE, FERRITIN, TIBC, IRON, RETICCTPCT in the last 72 hours. Sepsis Labs:  Recent Labs Lab 03/14/16 1729 03/14/16 2122 03/15/16 0413  PROCALCITON  --  0.22  --   LATICACIDVEN 1.69 1.0 1.1    Recent Results (from the past 240 hour(s))  Blood Culture (routine x 2)     Status: None (Preliminary result)   Collection Time: 03/14/16  5:22 PM  Result Value Ref Range Status   Specimen Description BLOOD RIGHT HAND  Final   Special Requests IN PEDIATRIC BOTTLE 2CC  Final   Culture NO GROWTH 3 DAYS  Final   Report Status PENDING  Incomplete  Blood Culture (routine x 2)     Status: None (Preliminary result)   Collection Time: 03/14/16  5:37 PM  Result Value Ref Range Status   Specimen Description BLOOD LEFT HAND  Final   Special Requests IN PEDIATRIC BOTTLE 2CC  Final   Culture NO GROWTH 3 DAYS  Final   Report Status PENDING  Incomplete  Urine culture     Status: Abnormal   Collection Time: 03/15/16  6:19 AM  Result Value Ref Range Status   Specimen Description URINE, CATHETERIZED  Final   Special Requests ADDED 0701  Final   Culture <10,000 COLONIES/mL INSIGNIFICANT GROWTH (A)  Final   Report Status 03/16/2016  FINAL  Final    Radiology Studies: Dg Chest Port 1 View  Result Date: 03/18/2016 CLINICAL DATA:  Followup pneumonia EXAM: PORTABLE CHEST 1 VIEW COMPARISON:  03/14/2016 and multiple previous FINDINGS: Heart size remains mildly enlarged. Aortic atherosclerosis again noted. Advanced chronic pleural and parenchymal scarring in the upper lungs again demonstrated. Whereas this is chronic, there could certainly be some acute inflammation hidden amongst the extensive chronic changes. Patchy infiltrate questioned in the peripheral right upper lung is diminishing. No worsening or new findings. IMPRESSION: Diminishing patchy density in the peripheral right upper lung. Chronic extensive pleuroparenchymal scarring in the upper lungs. Acute inflammatory changes could be hidden within these extensive chronic changes. Electronically Signed   By: Nelson Chimes M.D.   On: 03/18/2016 08:28   Scheduled Meds: . acidophilus  1 capsule Oral Daily  . aspirin EC  81 mg Oral Daily  . cefTRIAXone (ROCEPHIN)  IV  1 g Intravenous Q24H  . cholecalciferol  1,000 Units Oral Daily  . DULoxetine  60 mg Oral Daily  . guaiFENesin  600 mg Oral BID  . insulin aspart  0-9 Units Subcutaneous Q4H  . magnesium oxide  400 mg Oral Daily  . megestrol  20 mg Oral Daily  . mirtazapine  30 mg Oral QHS  . NIFEdipine  30 mg Oral Daily  . oseltamivir  30 mg Oral BID  . pantoprazole  40 mg Oral Daily  . polyethylene glycol  17 g Oral Daily  . potassium chloride SA  20 mEq Oral Daily  . simvastatin  40 mg Oral QPM  . torsemide  5 mg Oral Daily   Continuous Infusions:  LOS: 4 days   Arminta Gamm, MD PhD [facility] Pager [phone]  If 7PM-7AM, please contact night-coverage [email or web address] Password [email or web address] 03/18/2016, 10:58 AM

## 2016-03-19 DIAGNOSIS — E119 Type 2 diabetes mellitus without complications: Secondary | ICD-10-CM

## 2016-03-19 DIAGNOSIS — I611 Nontraumatic intracerebral hemorrhage in hemisphere, cortical: Secondary | ICD-10-CM

## 2016-03-19 DIAGNOSIS — J189 Pneumonia, unspecified organism: Secondary | ICD-10-CM

## 2016-03-19 LAB — CBC
HEMATOCRIT: 34.1 % — AB (ref 36.0–46.0)
HEMOGLOBIN: 11 g/dL — AB (ref 12.0–15.0)
MCH: 29.9 pg (ref 26.0–34.0)
MCHC: 32.3 g/dL (ref 30.0–36.0)
MCV: 92.7 fL (ref 78.0–100.0)
Platelets: 173 10*3/uL (ref 150–400)
RBC: 3.68 MIL/uL — ABNORMAL LOW (ref 3.87–5.11)
RDW: 14 % (ref 11.5–15.5)
WBC: 5.2 10*3/uL (ref 4.0–10.5)

## 2016-03-19 LAB — BASIC METABOLIC PANEL
ANION GAP: 11 (ref 5–15)
BUN: 10 mg/dL (ref 6–20)
CALCIUM: 8.7 mg/dL — AB (ref 8.9–10.3)
CHLORIDE: 100 mmol/L — AB (ref 101–111)
CO2: 26 mmol/L (ref 22–32)
CREATININE: 0.98 mg/dL (ref 0.44–1.00)
GFR calc non Af Amer: 49 mL/min — ABNORMAL LOW (ref >60)
GFR, EST AFRICAN AMERICAN: 57 mL/min — AB (ref >60)
Glucose, Bld: 146 mg/dL — ABNORMAL HIGH (ref 65–99)
Potassium: 4.2 mmol/L (ref 3.5–5.1)
SODIUM: 137 mmol/L (ref 135–145)

## 2016-03-19 LAB — TSH: TSH: 2.719 u[IU]/mL (ref 0.350–4.500)

## 2016-03-19 LAB — GLUCOSE, CAPILLARY
GLUCOSE-CAPILLARY: 141 mg/dL — AB (ref 65–99)
GLUCOSE-CAPILLARY: 152 mg/dL — AB (ref 65–99)
Glucose-Capillary: 142 mg/dL — ABNORMAL HIGH (ref 65–99)
Glucose-Capillary: 157 mg/dL — ABNORMAL HIGH (ref 65–99)

## 2016-03-19 LAB — CULTURE, BLOOD (ROUTINE X 2)
Culture: NO GROWTH
Culture: NO GROWTH

## 2016-03-19 LAB — MAGNESIUM: MAGNESIUM: 2.4 mg/dL (ref 1.7–2.4)

## 2016-03-19 MED ORDER — GUAIFENESIN ER 600 MG PO TB12: 600.0000 mg | ORAL_TABLET | Freq: Two times a day (BID) | ORAL | 0 refills | Status: DC

## 2016-03-19 MED ORDER — DOXYCYCLINE HYCLATE 100 MG PO TABS: 100.0000 mg | ORAL_TABLET | Freq: Two times a day (BID) | ORAL | 0 refills | Status: AC

## 2016-03-19 NOTE — Evaluation (Signed)
Clinical/Bedside Swallow Evaluation Patient Details  Name: Elizabeth Keller MRN: [id number] Date of Birth: [birth year]  Today's Date: 03/19/2016 Time: SLP Start Time (ACUTE ONLY): 0813 SLP Stop Time (ACUTE ONLY): 0827 SLP Time Calculation (min) (ACUTE ONLY): 14 min  Past Medical History:  Past Medical History:  Diagnosis Date  . Degenerative disk disease   . Diabetes mellitus   . GERD (gastroesophageal reflux disease)   . Hypercholesteremia   . Hypertension   . IBS (irritable bowel syndrome)   . Sarcoidosis ([facility])   . Spinal stenosis    Past Surgical History:  Past Surgical History:  Procedure Laterality Date  . EYE SURGERY     cataract   HPI:  Elizabeth Keller a 83 y.o.femalewith medical history significant of HTN, HLD, GERD, DM type II, CKDstage III andsarcoidosis; who presents from home with complaints of multiple falls and being acutely altered which is not her baseline per MD note. CT scan of the brain revealed a small left lateral ventricle hemorrhage.    Assessment / Plan / Recommendation Clinical Impression  SLP noted intermittent coughing and chest congestion prior to the bedside swallow evaluation. Oral motor assessment was [facility]. Ms. Fiero exhibited inconsistent immediate and delayed coughing after given trials of thin liquids via cup/straw likely due to current flu and PNA. Prolonged oral transit and mastication evident during regular solids (cracker); pt's dentures were unavailable at this time and daughter stated pt does not use when eating. RN administered medications to the pt with thin liquids and puree; pt experienced difficulty coordinating the puree texture with pills. Advised RN to utilize thin liquids for medication administration and to score larger pills. Pt appeared to have some degree of visual deficits which necessitated feeding set-up/assistance. Due to prlonged mastication, recommend Dys 3 solids, thin liquids (straws ok), meds whole with liquids. ST will f/u  for treatment to assess swallowing safety/efficiency and diet tolerance.    Aspiration Risk  Mild aspiration risk    Diet Recommendation Dysphagia 3 (Mech soft);Thin liquid   Liquid Administration via: Cup;Straw Medication Administration: Whole meds with liquid (score larger pills) Supervision: Staff to assist with self feeding;Intermittent supervision to cue for compensatory strategies Compensations: Minimize environmental distractions;Slow rate;Small sips/bites Postural Changes: Seated upright at 90 degrees    Other  Recommendations Oral Care Recommendations: Oral care BID   Follow up Recommendations Other (comment) (TBD)      Frequency and Duration min 1x/week  1 week       Prognosis Prognosis for Safe Diet Advancement: Good Barriers to Reach Goals: Cognitive deficits      Swallow Study   General HPI: Elizabeth Keller a 83 y.o.femalewith medical history significant of HTN, HLD, GERD, DM type II, CKDstage III andsarcoidosis; who presents from home with complaints of multiple falls and being acutely altered which is not her baseline per MD note. CT scan of the brain revealed a small left lateral ventricle hemorrhage.  Type of Study: Bedside Swallow Evaluation Previous Swallow Assessment: none Diet Prior to this Study: Regular;Thin liquids Temperature Spikes Noted: No Respiratory Status: Room air History of Recent Intubation: No Behavior/Cognition: Alert;Cooperative;Pleasant mood Oral Cavity Assessment: Within Functional Limits Oral Care Completed by SLP: No Oral Cavity - Dentition: Dentures, not available (dtr does not use when eating) Vision: Impaired for self-feeding Self-Feeding Abilities: Needs assist;Needs set up;Other (Comment) (vision questionable) Patient Positioning: Upright in chair Baseline Vocal Quality: Normal Volitional Cough: Congested Volitional Swallow: Able to elicit    Oral/Motor/Sensory Function Overall Oral Motor/Sensory Function: Within  functional  limits   Ice Chips Ice chips: Not tested   Thin Liquid Thin Liquid: Impaired Presentation: Cup;Straw Oral Phase Impairments: Other (comment) (none) Oral Phase Functional Implications: Other (comment) (none) Pharyngeal  Phase Impairments: Cough - Immediate;Cough - Delayed    Nectar Thick Nectar Thick Liquid: Not tested   Honey Thick Honey Thick Liquid: Not tested   Puree Puree: Not tested   Solid   GO   Solid: Impaired Presentation: Self Fed Oral Phase Impairments: Impaired mastication Oral Phase Functional Implications: Impaired mastication;Prolonged oral transit;Other (comment) (prolonged mastication) Pharyngeal Phase Impairments: Other (comments) (none)        [name] 03/19/2016,9:41 AM

## 2016-03-19 NOTE — Progress Notes (Signed)
Patient discharge teaching given to patient and her daughter, including activity, diet, follow-up appoints, and medications. Patients daughter verbalized understanding of all discharge instructions. IV access was d/c'd. Vitals are stable. Skin is intact except as charted in most recent assessments. Pt to be escorted out and driven home by PTAR.

## 2016-03-19 NOTE — Discharge Summary (Addendum)
Discharge Summary  Elizabeth Keller ZOX:[id number] DOB: [birth year]  PCP: Agustina Caroli, MD  Admit date: 03/14/2016 Discharge date: 03/19/2016  Time spent: >14mins, from 11:15 to 11:50am  Recommendations for Outpatient Follow-up:  1. F/u with PMD within a week  for hospital discharge follow up, repeat cbc/bmp at follow up 2. F/u with pulmonology for sarcoidosis   Discharge Diagnoses:  Active Hospital Problems   Diagnosis Date Noted  . Sepsis (HCC) 03/14/2016  . Influenza A 03/14/2016  . CAP (community acquired pneumonia) 03/14/2016  . ICH (intracerebral hemorrhage) (HCC) 03/14/2016  . Diabetes mellitus type II, controlled (HCC) 03/14/2016  . Chronic kidney disease, stage III (moderate) 03/14/2016  . Anemia 03/14/2016  . Hypertension 03/14/2016    Resolved Hospital Problems   Diagnosis Date Noted Date Resolved  No resolved problems to display.    Discharge Condition: stable  Diet recommendation: heart healthy/carb modified  Filed Weights   03/14/16 1657  Weight: 68 kg (150 lb)    History of present illness:  PCP: Agustina Caroli, MD  Patient coming from: Home via EMS  Chief Complaint: Falls  HPI: Elizabeth Keller is a 83 y.o. female with medical history significant of HTN, HLD, DM type II, CKD stage III and sarcoidosis; who presents from home with complaints of multiple falls and being acutely altered. At baseline the patient is noted to be ambulatory with walker and of sound mind. Patient is noted have somewhat of a shuffling gait. Sometime around 1 PM this afternoon she had 5 consecutive falls while trying to get up. Family noted that she was somewhat confused and her mental status, seemed to wax and wane. Other associated symptoms include chronic intermittent abdominal pain, constipation, leg swelling, decreased appetite, and productive cough(new). Patient had been taking over-the-counter cough medicine. Family denies knowledge of any fever, vomiting, chills, diarrhea,  chest pain, or shortness of breath complaints.   ED Course: Upon admission into the emergency department patient was seen to be febrile up to 101.40F, heart rates 105-109, respirations 21-36, blood pressure maintained, O2 saturations is 90-96% on room air. Lab work revealed WBC 7.4, hemoglobin 11.7, platelets 153, BUN 12 creatinine 1.21, glucose 216, lactic acid 1.69, troponin 0.01. Glucose sepsis was initiated and the patient received 500 mL of normal saline, azithromycin, Rocephin, and Tamiflu. CT scan of the brain revealed a small left lateral ventricle hemorrhage. Neurosurgery was consulted and recommended neuro checks and repeat CT scan in a.m. [facility] called to admit.  Hospital Course:  Principal Problem:   Sepsis (HCC) Active Problems:   Influenza A   CAP (community acquired pneumonia)   ICH (intracerebral hemorrhage) (HCC)   Diabetes mellitus type II, controlled (HCC)   Chronic kidney disease, stage III (moderate)   Anemia   Hypertension   sepsis 2/2 to suspected Community-acquired pneumonia and Influenza A: -Patient presents with confusion, metabolic encephalopathy, with fever 101.8, tachycardia in 100's, and tachypnea RR in the 30's.Lactit 1.69. Chest x-ray revealing possible pneumonia. Sepsis protocol was initiated. - - Procalcitonin 0.22; Lactic Acid Level was  1.69-1.1 - - C/w Droplet precautions now that + for Influenza A - Follow-up cultures showed NGTD at 2 days; Urine Cx <10,000 , sputum culture was not collected - she is treated with  Rocephin and azithromycin per pharmacy for suspected CAP - she is treated with Tamiflu as patient is positive for Influenza A - Mucinex 600 mg po BID - improving, repeat cxr showed improvement. Clinically she improved as well, mental status back to baseline, fever  has resolved. She is discharged on oral doxycycline for two days and out patient follow up with pmd and pulmonology - swallow eval :  Dysphagia 3 (Mech soft);Thin liquid   Liquid  Administration via: Cup;Straw Medication Administration: Whole meds with liquid (score larger pills) Supervision: Staff to assist with self feeding;Intermittent supervision to cue for compensatory strategies Compensations: Minimize environmental distractions;Slow rate;Small sips/bites Postural Changes: Seated upright at 90 degrees   Traumatic ICH from Fall:  -Acute. Patient found to have a small left lateral ventricle hemorrhage.  -Neurosurgery consult and recommended repeat CT scan in a.m. and frequent neuro checks - PT/INR was 15.8/1.25 - Goal systolic blood pressure less than 140 - Repeat CT Scan showed The blood seen in the left lateral ventricle previously is stable in overall quantity. However, there has been some redistribution from the body of the left lateral ventricle into the posterior horn and perhaps into the suprasellar cistern. No increased hemorrhage today. - No focal deficits noted.  - PT/OT recommend SNF but daughter and HCPOA wants to take her home.   Diabetes Mellitus Type 2: -On admission patient's blood glucose noted to be elevated at 216.Suspect some aspect of acute stress. Patient home medications consist of glipizide and metformin. - Hypoglycemic protocol - glipizide and metformin held in the hospital , resumed at discharge  Chronic Diastolic CHF:  -Due to the patient's recent cough and lower extremity swelling seen on physical exam. - BNP 197.2 - Was given IV Lasix 20 mg for 1 dose on 03/16/16; -  ECHOCardiogram showed EF of 55-60% with hypokinesis of the apical mycoardium and doppler parameters that were consistent with abnormal ventricular relaxation Grade 1 Diastolic Dysfunction. - volume has been stable on home meds Demadex.  Essential Hypertension - Continue Nifedipine 30 mg po Daily and Torsemide 5 mg Daily  Anxiety - Continue Klonopin 0.25-0.5 mg po BID prn  Constipation: chronix - Continue MiraLAX prn - Consider need of enema   Chronic  kidney disease stage III:  -Stable. Creatinine 1.04 - Continue to monitor  - C/w Demadex 5 mg po Daily  Sarcoidosis:  -She notes a history of pulmonary sarcoidosis but has not had any issues with this recently to their knowledge.  Chronic changes on cxr -outpatient follow up with pulmonology  Anemia: -Stable.  -hgb stable around 11.  FTT: family declined snf  Code Status: DO NOT RESUSCITATE Family Communication: Discussed with Daughter at bedside Disposition Plan: Home with Home Health  on 2/8  Consultants:   Neurosurgery   Procedures: CT Scan ECHOCARDIOGRAM Study Conclusions  - Left ventricle: The cavity size was normal. Systolic function was normal. The estimated ejection fraction was in the range of 55% to 60%. There is hypokinesis of the apical myocardium. Doppler parameters are consistent with abnormal left ventricular relaxation (grade 1 diastolic dysfunction). - Aortic valve: Trileaflet; moderately thickened, moderately calcified leaflets. - Mitral valve: Mild prolapse, involving the anterior leaflet. - Pulmonic valve: There was moderate regurgitation. - Pulmonary arteries: Systolic pressure was mildly increased. PA peak pressure: 35 mm Hg (S).    Antimicrobials:            Anti-infectives    Start     Dose/Rate Route Frequency Ordered Stop   03/15/16 1800  cefTRIAXone (ROCEPHIN) 1 g in dextrose 5 % 50 mL IVPB     1 g 100 mL/hr over 30 Minutes Intravenous Every 24 hours 03/14/16 2052     03/15/16 1000  azithromycin (ZITHROMAX) tablet 250 mg  250 mg Oral Daily 03/14/16 2052 03/18/16 1024   03/15/16 1000  oseltamivir (TAMIFLU) capsule 30 mg     30 mg Oral 2 times daily 03/14/16 2232 03/20/16 0959   03/14/16 1830  oseltamivir (TAMIFLU) capsule 30 mg     30 mg Oral  Once 03/14/16 1816 03/14/16 2208   03/14/16 1730  cefTRIAXone (ROCEPHIN) 1 g in dextrose 5 % 50 mL IVPB     1 g 100 mL/hr over 30 Minutes Intravenous  Once  03/14/16 1715 03/14/16 2119   03/14/16 1730  azithromycin (ZITHROMAX) tablet 500 mg     500 mg Oral  Once 03/14/16 1715 03/14/16 1739      Discharge Exam: BP 117/65   Pulse 98   Temp 99.5 F (37.5 C) (Oral)   Resp 18   Ht 5\' 7"  (1.702 m)   Wt 68 kg (150 lb)   SpO2 93%   BMI 23.49 kg/m   General: frail, but NAD, pleasant Cardiovascular: RRR Respiratory: crackles, possible related to underline sarcoidosis Extremity: no edema  Discharge Instructions You were cared for by a hospitalist during your hospital stay. If you have any questions about your discharge medications or the care you received while you were in the hospital after you are discharged, you can call the unit and asked to speak with the hospitalist on call if the hospitalist that took care of you is not available. Once you are discharged, your primary care physician will handle any further medical issues. Please note that NO REFILLS for any discharge medications will be authorized once you are discharged, as it is imperative that you return to your primary care physician (or establish a relationship with a primary care physician if you do not have one) for your aftercare needs so that they can reassess your need for medications and monitor your lab values.  Discharge Instructions    Diet general    Complete by:  As directed    Dysphagia 3 (Mech soft);Thin liquid   Liquid Administration via: Cup;Straw Medication Administration: Whole meds with liquid (score larger pills) Supervision: Staff to assist with self feeding;Intermittent supervision to cue for compensatory strategies Compensations: Minimize environmental distractions;Slow rate;Small sips/bites Postural Changes: Seated upright at 90 degrees  Carb modified   Increase activity slowly    Complete by:  As directed      Allergies as of 03/19/2016      Reactions   Dicyclomine Other (See Comments)   dizziness   Gabapentin Other (See Comments)   dizziness    Metformin And Related Nausea And Vomiting   Lyrica [pregabalin] Other (See Comments)   Topiramate Other (See Comments)      Medication List    STOP taking these medications   bismuth subsalicylate 262 MG/15ML suspension Commonly known as:  PEPTO BISMOL   MYLANTA PO     TAKE these medications   albuterol 108 (90 Base) MCG/ACT inhaler Commonly known as:  PROVENTIL HFA;VENTOLIN HFA Inhale into the lungs every 6 (six) hours as needed for wheezing or shortness of breath.   albuterol (2.5 MG/3ML) 0.083% nebulizer solution Commonly known as:  PROVENTIL Take 2.5 mg by nebulization every 6 (six) hours as needed for wheezing or shortness of breath.   aspirin 81 MG tablet Take 81 mg by mouth daily.   calcium carbonate 500 MG chewable tablet Commonly known as:  TUMS - dosed in mg elemental calcium Chew 1 tablet by mouth daily as needed for indigestion or heartburn.   cholecalciferol 1000  units tablet Commonly known as:  VITAMIN D Take 1,000 Units by mouth daily.   clonazePAM 0.5 MG tablet Commonly known as:  KLONOPIN Take 0.25-0.5 mg by mouth 2 (two) times daily as needed. 1/2 tab q am; whole tab at 2pm; may take 1/2 tab between morning and 2pm dose, then delay 2pm dose till 7pm For anxiety   CYMBALTA PO Take 60 mg by mouth daily.   diclofenac sodium 1 % Gel Commonly known as:  VOLTAREN Apply 2 g topically as needed.   doxycycline 100 MG tablet Commonly known as:  VIBRA-TABS Take 1 tablet (100 mg total) by mouth 2 (two) times daily.   glipiZIDE 10 MG tablet Commonly known as:  GLUCOTROL Take 5-10 mg by mouth daily before breakfast. Takes 1 tab in am and 1/2 tab in pm   guaiFENesin 600 MG 12 hr tablet Commonly known as:  MUCINEX Take 1 tablet (600 mg total) by mouth 2 (two) times daily.   lidocaine 5 % Commonly known as:  LIDODERM Place 1 patch onto the skin daily as needed for pain.   Magnesium 400 MG Tabs Take 400 mg by mouth daily.   megestrol 20 MG  tablet Commonly known as:  MEGACE Take 20 mg by mouth daily.   metFORMIN 500 MG tablet Commonly known as:  GLUCOPHAGE Take 500 mg by mouth daily with breakfast.   mirtazapine 30 MG tablet Commonly known as:  REMERON Take 30 mg by mouth at bedtime.   NIFEdipine 30 MG 24 hr tablet Commonly known as:  PROCARDIA-XL/ADALAT CC Take 30 mg by mouth daily.   omeprazole 20 MG capsule Commonly known as:  PRILOSEC Take 40 mg by mouth daily.   polyethylene glycol packet Commonly known as:  MIRALAX / GLYCOLAX Take 17 g by mouth daily.   potassium chloride SA 20 MEQ tablet Commonly known as:  K-DUR,KLOR-CON Take 20 mEq by mouth daily.   PROBIOTIC ACIDOPHILUS PO Take 1 tablet by mouth daily.   PROBIOTIC FORMULA Caps Take 1 capsule by mouth daily.   simvastatin 40 MG tablet Commonly known as:  ZOCOR Take 40 mg by mouth every evening.   torsemide 5 MG tablet Commonly known as:  DEMADEX Take 5 mg by mouth daily.            Durable Medical Equipment        Start     Ordered   03/18/16 1053  For home use only DME Hospital bed  Once    Question Answer Comment  Patient has (list medical condition): weakness   The above medical condition requires: Patient requires the ability to reposition frequently   Bed type Semi-electric      03/18/16 1053   03/17/16 2143  For home use only DME lightweight manual wheelchair with seat cushion  Once    Comments:  Patient suffers from Recurrent Falls which impairs their ability to perform daily activities like Ambulation in the home.  A Cane/walking aid will not resolve issue with performing activities of daily living. A wheelchair will allow patient to safely perform daily activities. Patient is not able to propel themselves in the home using a standard weight wheelchair due to weakness. Patient can self propel in the lightweight wheelchair.  Accessories: elevating leg rests (ELRs), wheel locks, extensions and anti-tippers.   03/17/16 2143      Allergies  Allergen Reactions  . Dicyclomine Other (See Comments)    dizziness  . Gabapentin Other (See Comments)    dizziness  .  Metformin And Related Nausea And Vomiting  . Lyrica [Pregabalin] Other (See Comments)  . Topiramate Other (See Comments)   Follow-up Information    HICKS, KRISTIN D, MD. Call in 1 week(s).   Specialty:  Internal Medicine Why:  Call to scheudle appointment within 1 week       BEAUFORD,WAYNE, MD Follow up in 1 month(s).   Specialty:  Pulmonary Disease Why:  for cough, h/o sarcoidosis Contact information: [facility] [address] [place] [address] [phone]        [facility] Follow up.   Why:  hospital bed and wheelchair Contact information: [name] [place] [address] [phone]        [facility] Follow up.   Why:  RN PT OT aide for home health.  Contact information: [address] [place] [address] [phone]            The results of significant diagnostics from this hospitalization (including imaging, microbiology, ancillary and laboratory) are listed below for reference.    Significant Diagnostic Studies: Ct Head Wo Contrast  Result Date: 03/15/2016 CLINICAL DATA:  Follow-up intracranial hemorrhage. EXAM: CT HEAD WITHOUT CONTRAST TECHNIQUE: Contiguous axial images were obtained from the base of the skull through the vertex without intravenous contrast. COMPARISON:  March 14, 2016 FINDINGS: Brain: No subdural, epidural, or parenchymal hemorrhage. The acute blood products in the body of the left lateral ventricle on the previous study have decreased in quantity with only a tiny amount of blood remaining at this site. However, the blood in the body of the left lateral ventricle on the previous study has redistributed into the posterior horn with an increase in this location. Slight high attenuation in the left suprasellar cistern may represent a  tiny amount of redistributed blood as well. Overall, the amount of hemorrhage has not increased. The ventricles are prominent but stable. The sulci are unchanged. White matter changes are identified. No acute cortical ischemia identified. The cerebellum and brainstem are normal. The basal cisterns are widely patent. Vascular: Calcified atherosclerosis seen in the intracranial carotid arteries. Skull: Normal. Negative for fracture or focal lesion. Sinuses/Orbits: No acute finding. Other: The posterior right scalp hematoma remains but is smaller in the interval. Extracranial soft tissues are otherwise stable. IMPRESSION: 1. The blood seen in the left lateral ventricle previously is stable in overall quantity. However, there has been some redistribution from the body of the left lateral ventricle into the posterior horn and perhaps into the suprasellar cistern. No increased hemorrhage today. Electronically Signed   By: Gerome Sam III M.D   On: 03/15/2016 07:17   Ct Head Wo Contrast  Result Date: 03/14/2016 CLINICAL DATA:  Multiple falls today, altered level of consciousness. EXAM: CT HEAD WITHOUT CONTRAST TECHNIQUE: Contiguous axial images were obtained from the base of the skull through the vertex without intravenous contrast. COMPARISON:  Head CT dated 01/29/2014. FINDINGS: Brain: There is generalized age related parenchymal atrophy with commensurate dilatation of the ventricles and sulci. Chronic small vessel ischemic changes noted within the bilateral periventricular and subcortical white matter regions. Small amount of acute hemorrhage is seen within the body of the left lateral ventricle and layering within the posterior horn of the left lateral ventricle. No parenchymal or extra-axial hemorrhage. Vascular: There are chronic calcified atherosclerotic changes of the large vessels at the skull base. No unexpected hyperdense vessel. Skull: Normal. Negative for fracture or focal lesion. Sinuses/Orbits: No  acute  finding. Other: Scalp hematoma overlying the right occipital bone, measuring approximately 1.6 cm thickness. No underlying fracture. IMPRESSION: 1. Small amount of acute hemorrhage within the left lateral ventricle. 2. No parenchymal or extra-axial hemorrhage. No evidence of parenchymal edema or mass effect. 3. Scalp hematoma overlying the right occipital bone, measuring approximately 1.6 cm thickness. No underlying fracture. 4. Atrophy and chronic ischemic changes in the white matter. Critical Value/emergent results were called by telephone at the time of interpretation on 03/14/2016 at 6:45 pm to Dr. Tilden Fossa , who verbally acknowledged these results. Needs Electronically Signed   By: Bary Richard M.D.   On: 03/14/2016 18:49   Dg Chest Port 1 View  Result Date: 03/18/2016 CLINICAL DATA:  Followup pneumonia EXAM: PORTABLE CHEST 1 VIEW COMPARISON:  03/14/2016 and multiple previous FINDINGS: Heart size remains mildly enlarged. Aortic atherosclerosis again noted. Advanced chronic pleural and parenchymal scarring in the upper lungs again demonstrated. Whereas this is chronic, there could certainly be some acute inflammation hidden amongst the extensive chronic changes. Patchy infiltrate questioned in the peripheral right upper lung is diminishing. No worsening or new findings. IMPRESSION: Diminishing patchy density in the peripheral right upper lung. Chronic extensive pleuroparenchymal scarring in the upper lungs. Acute inflammatory changes could be hidden within these extensive chronic changes. Electronically Signed   By: Paulina Fusi M.D.   On: 03/18/2016 08:28   Dg Chest Port 1 View  Result Date: 03/14/2016 CLINICAL DATA:  Altered mental status EXAM: PORTABLE CHEST 1 VIEW COMPARISON:  12/19/2015 chest radiograph. FINDINGS: Stable cardiomediastinal silhouette with top-normal heart size and aortic atherosclerosis. No pneumothorax. No pleural effusion. There is chronic severe bronchiectasis and volume  loss in the apical upper lobes, right greater than left. Patchy opacity in the peripheral right mid lung appears increased. Stable mild scarring at the left lung base. IMPRESSION: Patchy opacity in the peripheral right mid lung appears increased, cannot exclude a pneumonia. This finding is superimposed on chronic severe bronchiectasis and volume loss in the apical upper lobes. Recommend follow-up PA and lateral post treatment chest radiographs in 4-6 weeks. Aortic atherosclerosis. Electronically Signed   By: Delbert Phenix M.D.   On: 03/14/2016 17:28    Microbiology: Recent Results (from the past 240 hour(s))  Blood Culture (routine x 2)     Status: None (Preliminary result)   Collection Time: 03/14/16  5:22 PM  Result Value Ref Range Status   Specimen Description BLOOD RIGHT HAND  Final   Special Requests IN PEDIATRIC BOTTLE 2CC  Final   Culture NO GROWTH 4 DAYS  Final   Report Status PENDING  Incomplete  Blood Culture (routine x 2)     Status: None (Preliminary result)   Collection Time: 03/14/16  5:37 PM  Result Value Ref Range Status   Specimen Description BLOOD LEFT HAND  Final   Special Requests IN PEDIATRIC BOTTLE 2CC  Final   Culture NO GROWTH 4 DAYS  Final   Report Status PENDING  Incomplete  Urine culture     Status: Abnormal   Collection Time: 03/15/16  6:19 AM  Result Value Ref Range Status   Specimen Description URINE, CATHETERIZED  Final   Special Requests ADDED 0701  Final   Culture <10,000 COLONIES/mL INSIGNIFICANT GROWTH (A)  Final   Report Status 03/16/2016 FINAL  Final     Labs: Basic Metabolic Panel:  Recent Labs Lab 03/15/16 0413 03/16/16 0429 03/17/16 0607 03/18/16 0612 03/19/16 0444  NA 139 138 140 140 137  K 4.3  4.3 3.5 4.0 4.2  CL 104 103 99* 99* 100*  CO2 26 26 27 29 26   GLUCOSE 148* 137* 136* 168* 146*  BUN 11 13 12 10 10   CREATININE 1.08* 1.02* 1.04* 1.00 0.98  CALCIUM 9.7 9.0 9.0 8.9 8.7*  MG  --  2.2 2.1 2.3 2.4  PHOS  --  3.5 3.4 3.2  --     Liver Function Tests:  Recent Labs Lab 03/14/16 1716 03/16/16 0429 03/17/16 0607 03/18/16 0612  AST 69* 51* 31 28  ALT 52 33 27 25  ALKPHOS 99 82 79 83  BILITOT 0.4 1.1 0.5 0.5  PROT 6.7 6.1* 5.7* 5.9*  ALBUMIN 3.8 3.2* 2.9* 2.9*   No results for input(s): LIPASE, AMYLASE in the last 168 hours. No results for input(s): AMMONIA in the last 168 hours. CBC:  Recent Labs Lab 03/14/16 1716 03/15/16 0413 03/16/16 0429 03/17/16 0607 03/18/16 0612 03/19/16 0444  WBC 7.4 6.4 5.3 5.5 5.1 5.2  NEUTROABS 5.7  --  3.8 3.8 3.3  --   HGB 11.7* 11.6* 11.6* 11.3* 11.7* 11.0*  HCT 35.3* 34.7* 34.8* 34.2* 35.5* 34.1*  MCV 92.9 92.5 92.6 92.2 92.7 92.7  PLT 153 158 152 151 161 173   Cardiac Enzymes:  Recent Labs Lab 03/15/16 0413  TROPONINI <0.03   BNP: BNP (last 3 results)  Recent Labs  12/19/15 1100 03/14/16 1716  BNP 155.0* 197.2*    ProBNP (last 3 results) No results for input(s): PROBNP in the last 8760 hours.  CBG:  Recent Labs Lab 03/18/16 1727 03/18/16 2008 03/19/16 0008 03/19/16 0419 03/19/16 0754  GLUCAP 111* 197* 141* 152* 142*       Signed:  Korbin Notaro MD, PhD  [facility] 03/19/2016, 11:58 AM

## 2016-03-19 NOTE — Progress Notes (Signed)
Notified ny department director that family (non POA) giving push back about DC plan. CM spoke with POA: [name],[name] Daughter [phone]   Ms. Katrinka BlazingSmith parking car, on way in to hospital to take patient home. She states she will speak with family about DC upon arrival.

## 2016-03-19 NOTE — Progress Notes (Signed)
OT Cancellation Note  Patient Details Name: Ileana RoupVerna Burgner MRN: [id number] DOB: [birth year]   Cancelled Treatment:     Reason pt not seen/OT treatment not completed. Pt with d/c summary in chart and currently be d/c home with family today.  Mariam DollarBarnhill, Amika Tassin Beth Dixon, OTR/L 03/19/2016, 12:12 PM

## 2016-03-23 DIAGNOSIS — K219 Gastro-esophageal reflux disease without esophagitis: Secondary | ICD-10-CM | POA: Diagnosis not present

## 2016-03-23 DIAGNOSIS — J09X1 Influenza due to identified novel influenza A virus with pneumonia: Secondary | ICD-10-CM | POA: Diagnosis not present

## 2016-03-23 DIAGNOSIS — F419 Anxiety disorder, unspecified: Secondary | ICD-10-CM | POA: Diagnosis not present

## 2016-03-23 DIAGNOSIS — D869 Sarcoidosis, unspecified: Secondary | ICD-10-CM | POA: Diagnosis not present

## 2016-03-23 DIAGNOSIS — K589 Irritable bowel syndrome without diarrhea: Secondary | ICD-10-CM | POA: Diagnosis not present

## 2016-03-23 DIAGNOSIS — E785 Hyperlipidemia, unspecified: Secondary | ICD-10-CM | POA: Diagnosis not present

## 2016-03-23 DIAGNOSIS — L89620 Pressure ulcer of left heel, unstageable: Secondary | ICD-10-CM | POA: Diagnosis not present

## 2016-03-23 DIAGNOSIS — E1122 Type 2 diabetes mellitus with diabetic chronic kidney disease: Secondary | ICD-10-CM | POA: Diagnosis not present

## 2016-03-23 DIAGNOSIS — D631 Anemia in chronic kidney disease: Secondary | ICD-10-CM | POA: Diagnosis not present

## 2016-03-23 DIAGNOSIS — I129 Hypertensive chronic kidney disease with stage 1 through stage 4 chronic kidney disease, or unspecified chronic kidney disease: Secondary | ICD-10-CM | POA: Diagnosis not present

## 2016-03-23 DIAGNOSIS — Z7984 Long term (current) use of oral hypoglycemic drugs: Secondary | ICD-10-CM | POA: Diagnosis not present

## 2016-03-23 DIAGNOSIS — N183 Chronic kidney disease, stage 3 (moderate): Secondary | ICD-10-CM | POA: Diagnosis not present

## 2016-03-24 DIAGNOSIS — I129 Hypertensive chronic kidney disease with stage 1 through stage 4 chronic kidney disease, or unspecified chronic kidney disease: Secondary | ICD-10-CM | POA: Diagnosis not present

## 2016-03-24 DIAGNOSIS — N183 Chronic kidney disease, stage 3 (moderate): Secondary | ICD-10-CM | POA: Diagnosis not present

## 2016-03-24 DIAGNOSIS — D631 Anemia in chronic kidney disease: Secondary | ICD-10-CM | POA: Diagnosis not present

## 2016-03-24 DIAGNOSIS — J09X1 Influenza due to identified novel influenza A virus with pneumonia: Secondary | ICD-10-CM | POA: Diagnosis not present

## 2016-03-24 DIAGNOSIS — E1122 Type 2 diabetes mellitus with diabetic chronic kidney disease: Secondary | ICD-10-CM | POA: Diagnosis not present

## 2016-03-24 DIAGNOSIS — L89620 Pressure ulcer of left heel, unstageable: Secondary | ICD-10-CM | POA: Diagnosis not present

## 2016-03-25 ENCOUNTER — Telehealth: Payer: Self-pay | Admitting: Internal Medicine

## 2016-03-25 NOTE — Telephone Encounter (Signed)
Elizabeth Keller from [facility] is calling patient needs nursing and PT, patient was just discharged from hospital. [facility]  will need to discharge them if Elizabeth Keller is unable to ok orders. Call back [phone] ext [phone]

## 2016-03-25 NOTE — Telephone Encounter (Signed)
Caller name: Wynona CanesChristine  Relation to pt: PT  From [facility]  Call back number: [phone]    Reason for call:  Verbal orders for PT 2x 6, Physical Therapy informed patient will establish 04/03/16

## 2016-03-26 NOTE — Telephone Encounter (Signed)
Spoke w/ Melissa, verbal orders given . 

## 2016-03-26 NOTE — Telephone Encounter (Signed)
Ok to continue PT and nursing.

## 2016-03-27 DIAGNOSIS — J09X1 Influenza due to identified novel influenza A virus with pneumonia: Secondary | ICD-10-CM | POA: Diagnosis not present

## 2016-03-27 DIAGNOSIS — D631 Anemia in chronic kidney disease: Secondary | ICD-10-CM | POA: Diagnosis not present

## 2016-03-27 DIAGNOSIS — L89620 Pressure ulcer of left heel, unstageable: Secondary | ICD-10-CM | POA: Diagnosis not present

## 2016-03-27 DIAGNOSIS — N183 Chronic kidney disease, stage 3 (moderate): Secondary | ICD-10-CM | POA: Diagnosis not present

## 2016-03-27 DIAGNOSIS — I129 Hypertensive chronic kidney disease with stage 1 through stage 4 chronic kidney disease, or unspecified chronic kidney disease: Secondary | ICD-10-CM | POA: Diagnosis not present

## 2016-03-27 DIAGNOSIS — E1122 Type 2 diabetes mellitus with diabetic chronic kidney disease: Secondary | ICD-10-CM | POA: Diagnosis not present

## 2016-03-30 DIAGNOSIS — J09X1 Influenza due to identified novel influenza A virus with pneumonia: Secondary | ICD-10-CM | POA: Diagnosis not present

## 2016-03-30 DIAGNOSIS — I129 Hypertensive chronic kidney disease with stage 1 through stage 4 chronic kidney disease, or unspecified chronic kidney disease: Secondary | ICD-10-CM | POA: Diagnosis not present

## 2016-03-30 DIAGNOSIS — E1122 Type 2 diabetes mellitus with diabetic chronic kidney disease: Secondary | ICD-10-CM | POA: Diagnosis not present

## 2016-03-30 DIAGNOSIS — N183 Chronic kidney disease, stage 3 (moderate): Secondary | ICD-10-CM | POA: Diagnosis not present

## 2016-03-30 DIAGNOSIS — D631 Anemia in chronic kidney disease: Secondary | ICD-10-CM | POA: Diagnosis not present

## 2016-03-30 DIAGNOSIS — L89620 Pressure ulcer of left heel, unstageable: Secondary | ICD-10-CM | POA: Diagnosis not present

## 2016-03-31 DIAGNOSIS — E1122 Type 2 diabetes mellitus with diabetic chronic kidney disease: Secondary | ICD-10-CM | POA: Diagnosis not present

## 2016-03-31 DIAGNOSIS — L89620 Pressure ulcer of left heel, unstageable: Secondary | ICD-10-CM | POA: Diagnosis not present

## 2016-03-31 DIAGNOSIS — J09X1 Influenza due to identified novel influenza A virus with pneumonia: Secondary | ICD-10-CM | POA: Diagnosis not present

## 2016-03-31 DIAGNOSIS — I129 Hypertensive chronic kidney disease with stage 1 through stage 4 chronic kidney disease, or unspecified chronic kidney disease: Secondary | ICD-10-CM | POA: Diagnosis not present

## 2016-03-31 DIAGNOSIS — N183 Chronic kidney disease, stage 3 (moderate): Secondary | ICD-10-CM | POA: Diagnosis not present

## 2016-03-31 DIAGNOSIS — D631 Anemia in chronic kidney disease: Secondary | ICD-10-CM | POA: Diagnosis not present

## 2016-04-01 ENCOUNTER — Telehealth: Payer: Self-pay | Admitting: Family

## 2016-04-01 DIAGNOSIS — L89620 Pressure ulcer of left heel, unstageable: Secondary | ICD-10-CM | POA: Diagnosis not present

## 2016-04-01 DIAGNOSIS — E1122 Type 2 diabetes mellitus with diabetic chronic kidney disease: Secondary | ICD-10-CM | POA: Diagnosis not present

## 2016-04-01 DIAGNOSIS — J09X1 Influenza due to identified novel influenza A virus with pneumonia: Secondary | ICD-10-CM | POA: Diagnosis not present

## 2016-04-01 DIAGNOSIS — N183 Chronic kidney disease, stage 3 (moderate): Secondary | ICD-10-CM | POA: Diagnosis not present

## 2016-04-01 DIAGNOSIS — I129 Hypertensive chronic kidney disease with stage 1 through stage 4 chronic kidney disease, or unspecified chronic kidney disease: Secondary | ICD-10-CM | POA: Diagnosis not present

## 2016-04-01 DIAGNOSIS — D631 Anemia in chronic kidney disease: Secondary | ICD-10-CM | POA: Diagnosis not present

## 2016-04-01 NOTE — Telephone Encounter (Signed)
Caller name: Victorino DikeJennifer Relationship to patient: [facility] Can be reached: [phone] Pharmacy:  Reason for call: Patient is scheduled to come in on Friday for her New Patient Appointment with Sandford Crazemelissa O'Sullivan. [facility] ask that both of her heals be looked at because she has ulcers on both and they need a treatment plan. They also state they need an order for a Child psychotherapistocial Worker. Victorino DikeJennifer can be contacted after patient is seen.

## 2016-04-02 DIAGNOSIS — J09X1 Influenza due to identified novel influenza A virus with pneumonia: Secondary | ICD-10-CM | POA: Diagnosis not present

## 2016-04-02 DIAGNOSIS — I129 Hypertensive chronic kidney disease with stage 1 through stage 4 chronic kidney disease, or unspecified chronic kidney disease: Secondary | ICD-10-CM | POA: Diagnosis not present

## 2016-04-02 DIAGNOSIS — E1122 Type 2 diabetes mellitus with diabetic chronic kidney disease: Secondary | ICD-10-CM | POA: Diagnosis not present

## 2016-04-02 DIAGNOSIS — N183 Chronic kidney disease, stage 3 (moderate): Secondary | ICD-10-CM | POA: Diagnosis not present

## 2016-04-02 DIAGNOSIS — D631 Anemia in chronic kidney disease: Secondary | ICD-10-CM | POA: Diagnosis not present

## 2016-04-02 DIAGNOSIS — L89620 Pressure ulcer of left heel, unstageable: Secondary | ICD-10-CM | POA: Diagnosis not present

## 2016-04-03 ENCOUNTER — Ambulatory Visit (INDEPENDENT_AMBULATORY_CARE_PROVIDER_SITE_OTHER): Payer: Medicare Other | Admitting: Family

## 2016-04-03 ENCOUNTER — Encounter (HOSPITAL_BASED_OUTPATIENT_CLINIC_OR_DEPARTMENT_OTHER): Payer: Self-pay | Admitting: *Deleted

## 2016-04-03 ENCOUNTER — Encounter: Payer: Self-pay | Admitting: Family

## 2016-04-03 ENCOUNTER — Inpatient Hospital Stay (HOSPITAL_BASED_OUTPATIENT_CLINIC_OR_DEPARTMENT_OTHER)
Admission: EM | Admit: 2016-04-03 | Discharge: 2016-04-06 | DRG: 641 | Disposition: A | Discharge status: Home Health | Payer: Medicare Other | Attending: Internal Medicine | Admitting: Internal Medicine

## 2016-04-03 ENCOUNTER — Emergency Department (HOSPITAL_BASED_OUTPATIENT_CLINIC_OR_DEPARTMENT_OTHER): Payer: Medicare Other

## 2016-04-03 VITALS — BP 130/65 | HR 105 | Resp 18 | Wt 153.0 lb

## 2016-04-03 DIAGNOSIS — E1129 Type 2 diabetes mellitus with other diabetic kidney complication: Secondary | ICD-10-CM | POA: Diagnosis present

## 2016-04-03 DIAGNOSIS — N289 Disorder of kidney and ureter, unspecified: Secondary | ICD-10-CM

## 2016-04-03 DIAGNOSIS — K589 Irritable bowel syndrome without diarrhea: Secondary | ICD-10-CM | POA: Diagnosis present

## 2016-04-03 DIAGNOSIS — R079 Chest pain, unspecified: Secondary | ICD-10-CM | POA: Diagnosis not present

## 2016-04-03 DIAGNOSIS — R109 Unspecified abdominal pain: Secondary | ICD-10-CM

## 2016-04-03 DIAGNOSIS — Z9181 History of falling: Secondary | ICD-10-CM

## 2016-04-03 DIAGNOSIS — I129 Hypertensive chronic kidney disease with stage 1 through stage 4 chronic kidney disease, or unspecified chronic kidney disease: Secondary | ICD-10-CM | POA: Diagnosis present

## 2016-04-03 DIAGNOSIS — Z7984 Long term (current) use of oral hypoglycemic drugs: Secondary | ICD-10-CM

## 2016-04-03 DIAGNOSIS — I4510 Unspecified right bundle-branch block: Secondary | ICD-10-CM | POA: Diagnosis present

## 2016-04-03 DIAGNOSIS — M1990 Unspecified osteoarthritis, unspecified site: Secondary | ICD-10-CM | POA: Diagnosis present

## 2016-04-03 DIAGNOSIS — N183 Chronic kidney disease, stage 3 unspecified: Secondary | ICD-10-CM | POA: Diagnosis present

## 2016-04-03 DIAGNOSIS — R1084 Generalized abdominal pain: Secondary | ICD-10-CM | POA: Diagnosis not present

## 2016-04-03 DIAGNOSIS — D638 Anemia in other chronic diseases classified elsewhere: Secondary | ICD-10-CM | POA: Diagnosis present

## 2016-04-03 DIAGNOSIS — D869 Sarcoidosis, unspecified: Secondary | ICD-10-CM | POA: Diagnosis not present

## 2016-04-03 DIAGNOSIS — Z79899 Other long term (current) drug therapy: Secondary | ICD-10-CM

## 2016-04-03 DIAGNOSIS — L89322 Pressure ulcer of left buttock, stage 2: Secondary | ICD-10-CM | POA: Diagnosis not present

## 2016-04-03 DIAGNOSIS — L8990 Pressure ulcer of unspecified site, unspecified stage: Secondary | ICD-10-CM

## 2016-04-03 DIAGNOSIS — Z87891 Personal history of nicotine dependence: Secondary | ICD-10-CM

## 2016-04-03 DIAGNOSIS — K219 Gastro-esophageal reflux disease without esophagitis: Secondary | ICD-10-CM | POA: Diagnosis present

## 2016-04-03 DIAGNOSIS — L89302 Pressure ulcer of unspecified buttock, stage 2: Secondary | ICD-10-CM | POA: Diagnosis present

## 2016-04-03 DIAGNOSIS — E785 Hyperlipidemia, unspecified: Secondary | ICD-10-CM | POA: Diagnosis present

## 2016-04-03 DIAGNOSIS — Z8249 Family history of ischemic heart disease and other diseases of the circulatory system: Secondary | ICD-10-CM

## 2016-04-03 DIAGNOSIS — E875 Hyperkalemia: Principal | ICD-10-CM | POA: Diagnosis present

## 2016-04-03 DIAGNOSIS — Z7982 Long term (current) use of aspirin: Secondary | ICD-10-CM

## 2016-04-03 DIAGNOSIS — E7800 Pure hypercholesterolemia, unspecified: Secondary | ICD-10-CM | POA: Diagnosis present

## 2016-04-03 DIAGNOSIS — E1121 Type 2 diabetes mellitus with diabetic nephropathy: Secondary | ICD-10-CM | POA: Diagnosis not present

## 2016-04-03 DIAGNOSIS — Z66 Do not resuscitate: Secondary | ICD-10-CM | POA: Diagnosis present

## 2016-04-03 DIAGNOSIS — E1122 Type 2 diabetes mellitus with diabetic chronic kidney disease: Secondary | ICD-10-CM | POA: Diagnosis present

## 2016-04-03 DIAGNOSIS — R072 Precordial pain: Secondary | ICD-10-CM | POA: Diagnosis not present

## 2016-04-03 DIAGNOSIS — J45909 Unspecified asthma, uncomplicated: Secondary | ICD-10-CM | POA: Diagnosis present

## 2016-04-03 DIAGNOSIS — R935 Abnormal findings on diagnostic imaging of other abdominal regions, including retroperitoneum: Secondary | ICD-10-CM | POA: Diagnosis not present

## 2016-04-03 DIAGNOSIS — L89312 Pressure ulcer of right buttock, stage 2: Secondary | ICD-10-CM | POA: Diagnosis present

## 2016-04-03 DIAGNOSIS — I700 Atherosclerosis of aorta: Secondary | ICD-10-CM | POA: Diagnosis present

## 2016-04-03 DIAGNOSIS — E876 Hypokalemia: Secondary | ICD-10-CM | POA: Diagnosis not present

## 2016-04-03 DIAGNOSIS — Z888 Allergy status to other drugs, medicaments and biological substances status: Secondary | ICD-10-CM

## 2016-04-03 LAB — LIPASE, BLOOD: LIPASE: 24 U/L (ref 11–51)

## 2016-04-03 LAB — CBC WITH DIFFERENTIAL/PLATELET
BAND NEUTROPHILS: 1 %
BASOS ABS: 0 10*3/uL (ref 0.0–0.1)
BASOS PCT: 0 %
EOS PCT: 4 %
Eosinophils Absolute: 0.4 10*3/uL (ref 0.0–0.7)
HEMATOCRIT: 30.2 % — AB (ref 36.0–46.0)
Hemoglobin: 10 g/dL — ABNORMAL LOW (ref 12.0–15.0)
LYMPHS ABS: 0.6 10*3/uL — AB (ref 0.7–4.0)
Lymphocytes Relative: 6 %
MCH: 31.1 pg (ref 26.0–34.0)
MCHC: 33.1 g/dL (ref 30.0–36.0)
MCV: 93.8 fL (ref 78.0–100.0)
MONO ABS: 0.8 10*3/uL (ref 0.1–1.0)
Monocytes Relative: 8 %
NEUTROS ABS: 8 10*3/uL — AB (ref 1.7–7.7)
NEUTROS PCT: 81 %
PLATELETS: 342 10*3/uL (ref 150–400)
RBC: 3.22 MIL/uL — ABNORMAL LOW (ref 3.87–5.11)
RDW: 14.8 % (ref 11.5–15.5)
WBC: 9.8 10*3/uL (ref 4.0–10.5)

## 2016-04-03 LAB — COMPREHENSIVE METABOLIC PANEL
ALT: 26 U/L (ref 14–54)
AST: 25 U/L (ref 15–41)
Albumin: 2.9 g/dL — ABNORMAL LOW (ref 3.5–5.0)
Alkaline Phosphatase: 84 U/L (ref 38–126)
Anion gap: 12 (ref 5–15)
BUN: 39 mg/dL — AB (ref 6–20)
CO2: 15 mmol/L — ABNORMAL LOW (ref 22–32)
CREATININE: 1.25 mg/dL — AB (ref 0.44–1.00)
Calcium: 9.1 mg/dL (ref 8.9–10.3)
Chloride: 112 mmol/L — ABNORMAL HIGH (ref 101–111)
GFR, EST AFRICAN AMERICAN: 43 mL/min — AB (ref >60)
GFR, EST NON AFRICAN AMERICAN: 37 mL/min — AB (ref >60)
Glucose, Bld: 146 mg/dL — ABNORMAL HIGH (ref 65–99)
POTASSIUM: 6 mmol/L — AB (ref 3.5–5.1)
Sodium: 139 mmol/L (ref 135–145)
TOTAL PROTEIN: 6.9 g/dL (ref 6.5–8.1)
Total Bilirubin: 0.9 mg/dL (ref 0.3–1.2)

## 2016-04-03 LAB — URINALYSIS, ROUTINE W REFLEX MICROSCOPIC
GLUCOSE, UA: NEGATIVE mg/dL
HGB URINE DIPSTICK: NEGATIVE
KETONES UR: NEGATIVE mg/dL
Leukocytes, UA: NEGATIVE
Nitrite: NEGATIVE
PROTEIN: NEGATIVE mg/dL
Specific Gravity, Urine: 1.017 (ref 1.005–1.030)
pH: 6 (ref 5.0–8.0)

## 2016-04-03 LAB — TROPONIN I
Troponin I: <0.03 ng/mL (ref <0.03)
Troponin I: <0.03 ng/mL (ref <0.03)

## 2016-04-03 MED ORDER — CLONAZEPAM 0.5 MG PO TABS
0.5000 mg | ORAL_TABLET | Freq: Once | ORAL | Status: AC
  Administered 2016-04-03: 0.5 mg via ORAL
  Filled 2016-04-03: qty 1

## 2016-04-03 MED ORDER — MEGESTROL ACETATE 20 MG PO TABS: 20.0000 mg | ORAL_TABLET | Freq: Every day | ORAL | 0 refills | Status: DC

## 2016-04-03 MED ORDER — ALBUTEROL SULFATE (2.5 MG/3ML) 0.083% IN NEBU
5.0000 mg | INHALATION_SOLUTION | Freq: Once | RESPIRATORY_TRACT | Status: AC
  Administered 2016-04-03: 5 mg via RESPIRATORY_TRACT
  Filled 2016-04-03: qty 6

## 2016-04-03 MED ORDER — ACETAMINOPHEN 325 MG PO TABS
650.0000 mg | ORAL_TABLET | Freq: Four times a day (QID) | ORAL | Status: DC | PRN
  Administered 2016-04-05 – 2016-04-06 (×3): 650 mg via ORAL
  Filled 2016-04-03 (×3): qty 2

## 2016-04-03 MED ORDER — FAMOTIDINE IN NACL 20-0.9 MG/50ML-% IV SOLN
20.0000 mg | INTRAVENOUS | Status: DC
  Administered 2016-04-03: 20 mg via INTRAVENOUS
  Filled 2016-04-03: qty 50

## 2016-04-03 MED ORDER — ONDANSETRON HCL 4 MG/2ML IJ SOLN: 4.0000 mg | Freq: Four times a day (QID) | INTRAMUSCULAR | Status: DC | PRN

## 2016-04-03 MED ORDER — SODIUM POLYSTYRENE SULFONATE 15 GM/60ML PO SUSP
15.0000 g | Freq: Once | ORAL | Status: AC
  Administered 2016-04-03: 15 g via ORAL
  Filled 2016-04-03: qty 60

## 2016-04-03 MED ORDER — SODIUM CHLORIDE 0.9 % IV SOLN
INTRAVENOUS | Status: AC
  Administered 2016-04-03: 21:00:00 via INTRAVENOUS

## 2016-04-03 MED ORDER — SODIUM CHLORIDE 0.9 % IV SOLN
1.0000 g | Freq: Once | INTRAVENOUS | Status: AC
  Administered 2016-04-03: 1 g via INTRAVENOUS
  Filled 2016-04-03: qty 10

## 2016-04-03 MED ORDER — SODIUM CHLORIDE 0.9 % IV SOLN
Freq: Once | INTRAVENOUS | Status: AC
  Administered 2016-04-03: 17:00:00 via INTRAVENOUS

## 2016-04-03 NOTE — ED Notes (Signed)
Pt was focused on lower abd during initial assessment. Now pt is pointing to central chest and states the pain moved to chest. Pt states pain is intermittent comes and goes moving to other places.

## 2016-04-03 NOTE — Progress Notes (Signed)
Pre visit review using our clinic review tool, if applicable. No additional management support is needed unless otherwise documented below in the visit note. 

## 2016-04-03 NOTE — ED Triage Notes (Addendum)
She was admitted last week with flu. Here today with abdominal pain that started yesterday. She is screaming with pain. Her grandson is rubbing her mid abdomen and she tells him it makes her feel better. When he stops she tells him to keep rubbing. Family states her mental status has changed.

## 2016-04-03 NOTE — ED Provider Notes (Signed)
Received transfer of patient care at end of shift by Melburn HakeNicole Nadeau, PA-C pending CT abdomen results. PA had already spoken to admitting physician Dr. Roda ShuttersXu who was awaiting CT results to complete admission process.  CT abdomen was negative. Dr. Roda ShuttersXu spoke to Dr. Donnald GarrePfeiffer regarding this patient's admission and she has a bed.   Georgiana ShoreJessica B Stephan Nelis, PA-C 04/03/16 1836    Arby BarretteMarcy Pfeiffer, MD 04/18/16 [phone]

## 2016-04-03 NOTE — Progress Notes (Signed)
Subjective:    Patient ID: Elizabeth Keller, female    DOB: [birth year], 83 y.o.   MRN: [id number]  HPI  Elizabeth Keller is a 83 yr old female with pmhx significan t for HTN, HLD, DM2, CKD stage III and sarcoidosis who presented to the ED with falls and AMS.  who presents today to establish care and for hospital follow up. Discharge summary is reviewed.  Pt was admitted 2/3- 03/19/16 with sepsis, influenza, CAP. She was treated for influenza, pna and also found to have a small left lateral ventricular hemorrhage.  Neurosurgery was consulted during her hospitalization.   Today the patient reports abdominal pain.   She has been "hollering out in pain."  Daughter reports that her appetite has not been good- eating very little. She is unable to quantify of further describe her pain when asked.    Daughter reports that she has some memory loss.  Patient lives with daughter and grandson.  Per daughter they declined placement at the time of her hospital discharge.   She is not walking due to the pain in her heels. She apparently was walking prior to admission.  She is being followed by a home health RN.    No results found for: HGBA1C  She was followed by Dr. Christeen Douglas until she left her practice.   Review of Systems ROS  Unable to obtain a complete ROS due to patient's dementia.     Past Medical History:  Diagnosis Date  . Arthritis   . Asthma   . Degenerative disk disease   . Diabetes mellitus   . GERD (gastroesophageal reflux disease)   . Hypercholesteremia   . Hypertension   . IBS (irritable bowel syndrome)   . Sarcoidosis (HCC)   . Spinal stenosis      Social History   Social History  . Marital status: Divorced    Spouse name: N/A  . Number of children: N/A  . Years of education: N/A   Occupational History  . Not on file.   Social History Main Topics  . Smoking status: Former Games developer  . Smokeless tobacco: Never Used  . Alcohol use No  . Drug use: No  . Sexual activity: Not on  file   Other Topics Concern  . Not on file   Social History Narrative  . No narrative on file    Past Surgical History:  Procedure Laterality Date  . EYE SURGERY  2017   cataract / glaucoma    History reviewed. No pertinent family history.  Allergies  Allergen Reactions  . Dicyclomine Other (See Comments)    dizziness  . Gabapentin Other (See Comments)    dizziness  . Metformin And Related Nausea And Vomiting  . Lyrica [Pregabalin] Other (See Comments)  . Topiramate Other (See Comments)    Current Outpatient Prescriptions on File Prior to Visit  Medication Sig Dispense Refill  . albuterol (PROVENTIL HFA;VENTOLIN HFA) 108 (90 Base) MCG/ACT inhaler Inhale into the lungs every 6 (six) hours as needed for wheezing or shortness of breath.    Marland Kitchen albuterol (PROVENTIL) (2.5 MG/3ML) 0.083% nebulizer solution Take 2.5 mg by nebulization every 6 (six) hours as needed for wheezing or shortness of breath.    Marland Kitchen aspirin 81 MG tablet Take 81 mg by mouth daily.    . calcium carbonate (TUMS - DOSED IN MG ELEMENTAL CALCIUM) 500 MG chewable tablet Chew 1 tablet by mouth daily as needed for indigestion or heartburn.    . cholecalciferol (VITAMIN  D) 1000 units tablet Take 1,000 Units by mouth daily.    . clonazePAM (KLONOPIN) 0.5 MG tablet Take 0.25-0.5 mg by mouth 2 (two) times daily as needed. 1/2 tab q am; whole tab at 2pm; may take 1/2 tab between morning and 2pm dose, then delay 2pm dose till 7pm For anxiety    . diclofenac sodium (VOLTAREN) 1 % GEL Apply 2 g topically as needed.    . DULoxetine HCl (CYMBALTA PO) Take 60 mg by mouth daily.     Marland Kitchen. glipiZIDE (GLUCOTROL) 10 MG tablet Take 5-10 mg by mouth daily before breakfast. Takes 1 tab in am and 1/2 tab in pm    . lidocaine (LIDODERM) 5 % Place 1 patch onto the skin daily as needed for pain.    . megestrol (MEGACE) 20 MG tablet Take 20 mg by mouth daily.     . metFORMIN (GLUCOPHAGE) 500 MG tablet Take 500 mg by mouth daily with breakfast.      . mirtazapine (REMERON) 30 MG tablet Take 30 mg by mouth at bedtime.    Marland Kitchen. NIFEdipine (PROCARDIA-XL/ADALAT CC) 30 MG 24 hr tablet Take 30 mg by mouth daily.    Marland Kitchen. omeprazole (PRILOSEC) 20 MG capsule Take 40 mg by mouth daily.     . potassium chloride SA (K-DUR,KLOR-CON) 20 MEQ tablet Take 20 mEq by mouth daily.    . Probiotic Product (PROBIOTIC FORMULA) CAPS Take 1 capsule by mouth daily.    . simvastatin (ZOCOR) 40 MG tablet Take 40 mg by mouth every evening.    . torsemide (DEMADEX) 5 MG tablet Take 5 mg by mouth daily.    . polyethylene glycol (MIRALAX / GLYCOLAX) packet Take 17 g by mouth daily.     No current facility-administered medications on file prior to visit.     BP 130/65 (BP Location: Right Arm, Cuff Size: Normal)   Pulse (!) 105   Resp 18   Wt 153 lb (69.4 kg)   SpO2 100% Comment: ROOM AIR  BMI 23.96 kg/m     Objective:   Physical Exam  Constitutional: She appears well-developed and well-nourished. She appears distressed.  Cardiovascular: Normal rate and regular rhythm.   No murmur heard. Pulmonary/Chest: Effort normal and breath sounds normal. No respiratory distress. She has no wheezes.  Abdominal: There is tenderness in the right upper quadrant, right lower quadrant, epigastric area, left upper quadrant and left lower quadrant.  Neurological: She is alert.  confused  Skin:  Right heel has boggy fluid/blister overlying heel. Left heel has smaller blister/not boggy  Small approx 1 cm wide stage 2 ulcer left of sacrum, stage 2 ulcer right buttock approx 1 inch width  Incontinence of stool noted during exam.    Psychiatric: Cognition and memory are impaired.  Patient screaming intermittently during exam and interview          Assessment & Plan:  Abdominal Pain- reports severe abdominal pain, not eating drinking well per family.  Needs further evaluation and pain control, will refer to the ED given severity of her pain.    Decubitous ulcers- sacral-  advised RN to place duoderm q3 days and prn and ensure gel overlay to mattress and chair. For heel ulcers, float heels, paint with betadine daily and place heel protectors.  Will also refer to social work to evaluate and assist with community resources per home health RN request.   These orders called to Randol KernJennifer Harrison RN at [phone].  Refer to wound clinic as well.  30 minutes spent with pt and family today. >50% of this time was spent counseling them on work up for her abdominal pain and wound care.

## 2016-04-03 NOTE — ED Notes (Signed)
Patient transported to CT 

## 2016-04-03 NOTE — ED Provider Notes (Signed)
[facility] Provider Note   CSN: [id number] Arrival date & time: 04/03/16  1221     History   Chief Complaint Chief Complaint  Patient presents with  . Abdominal Pain    HPI Elizabeth Keller is a 83 y.o. female.  HPI   Patient is a 83 year old female with PMH of DM, HTN, sarcoidosis, asthma, IBS, GERD, CKD and CHF (EF 55-60%) who presents to the ED accompanied by family members with complaints of abdominal pain, onset yesterday. Patient's family member reports patient began complaining of intermittent abdominal pain last night which continued today resulting in him coming to the ED for evaluation. Upon questioning patient points to her mid sternal chest wall when she is asked to localize where her pain is at. She denies any abdominal pain at this time. She reports having intermittent pain to her chest and is unable to describe the pain further. Denies any aggravating or alleviating factors. Family reports patient was admitted to the hospital on 2/3 for influenza and was discharged home on 2/80. She is currently not on any antibiotics but states since she has been home from the hospital she has been more weak and has not been ambulating as frequently. Family also reports that patient has chronic pain all over which they state is unchanged. Denies fever, chills, headache, altered mental status, cough, shortness of breath, wheezing, vomiting, diarrhea, urinary symptoms, rash, leg swelling. Family member reports patient has been at her baseline mental status and denies any recent falls. Denies use of anticoagulants.  PCP- Dr. Peggyann Juba  Past Medical History:  Diagnosis Date  . Arthritis   . Asthma   . Degenerative disk disease   . Diabetes mellitus   . GERD (gastroesophageal reflux disease)   . Hypercholesteremia   . Hypertension   . IBS (irritable bowel syndrome)   . Sarcoidosis (HCC)   . Spinal stenosis     Patient Active Problem List   Diagnosis Date Noted  . Sepsis  (HCC) 03/14/2016  . Influenza A 03/14/2016  . CAP (community acquired pneumonia) 03/14/2016  . ICH (intracerebral hemorrhage) (HCC) 03/14/2016  . Diabetes mellitus type II, controlled (HCC) 03/14/2016  . Chronic kidney disease, stage III (moderate) 03/14/2016  . Anemia 03/14/2016  . Hypertension 03/14/2016    Past Surgical History:  Procedure Laterality Date  . EYE SURGERY  2017   cataract / glaucoma    OB History    No data available       Home Medications    Prior to Admission medications   Medication Sig Start Date End Date Taking? Authorizing Provider  albuterol (PROVENTIL HFA;VENTOLIN HFA) 108 (90 Base) MCG/ACT inhaler Inhale into the lungs every 6 (six) hours as needed for wheezing or shortness of breath.    Historical Provider, MD  albuterol (PROVENTIL) (2.5 MG/3ML) 0.083% nebulizer solution Take 2.5 mg by nebulization every 6 (six) hours as needed for wheezing or shortness of breath.    Historical Provider, MD  aspirin 81 MG tablet Take 81 mg by mouth daily.    Historical Provider, MD  calcium carbonate (TUMS - DOSED IN MG ELEMENTAL CALCIUM) 500 MG chewable tablet Chew 1 tablet by mouth daily as needed for indigestion or heartburn.    Historical Provider, MD  cholecalciferol (VITAMIN D) 1000 units tablet Take 1,000 Units by mouth daily.    Historical Provider, MD  clonazePAM (KLONOPIN) 0.5 MG tablet Take 0.25-0.5 mg by mouth 2 (two) times daily as needed. 1/2 tab q am; whole tab at  2pm; may take 1/2 tab between morning and 2pm dose, then delay 2pm dose till 7pm For anxiety    Historical Provider, MD  diclofenac sodium (VOLTAREN) 1 % GEL Apply 2 g topically as needed.    Historical Provider, MD  DULoxetine HCl (CYMBALTA PO) Take 60 mg by mouth daily.     Historical Provider, MD  glipiZIDE (GLUCOTROL) 10 MG tablet Take 5-10 mg by mouth daily before breakfast. Takes 1 tab in am and 1/2 tab in pm    Historical Provider, MD  lidocaine (LIDODERM) 5 % Place 1 patch onto the skin  daily as needed for pain. 12/13/13   Historical Provider, MD  megestrol (MEGACE) 20 MG tablet Take 1 tablet (20 mg total) by mouth daily. 04/03/16   Sandford CrazeMelissa O'Sullivan, NP  metFORMIN (GLUCOPHAGE) 500 MG tablet Take 500 mg by mouth daily with breakfast.     Historical Provider, MD  mirtazapine (REMERON) 30 MG tablet Take 30 mg by mouth at bedtime.    Historical Provider, MD  NIFEdipine (PROCARDIA-XL/ADALAT CC) 30 MG 24 hr tablet Take 30 mg by mouth daily.    Historical Provider, MD  omeprazole (PRILOSEC) 20 MG capsule Take 40 mg by mouth daily.     Historical Provider, MD  polyethylene glycol (MIRALAX / GLYCOLAX) packet Take 17 g by mouth daily.    Historical Provider, MD  potassium chloride SA (K-DUR,KLOR-CON) 20 MEQ tablet Take 20 mEq by mouth daily.    Historical Provider, MD  Probiotic Product (PROBIOTIC FORMULA) CAPS Take 1 capsule by mouth daily.    Historical Provider, MD  simvastatin (ZOCOR) 40 MG tablet Take 40 mg by mouth every evening.    Historical Provider, MD  torsemide (DEMADEX) 5 MG tablet Take 5 mg by mouth daily.    Historical Provider, MD    Family History No family history on file.  Social History Social History  Substance Use Topics  . Smoking status: Former Games developermoker  . Smokeless tobacco: Never Used  . Alcohol use No     Allergies   Dicyclomine; Gabapentin; Metformin and related; Lyrica [pregabalin]; and Topiramate   Review of Systems Review of Systems  Cardiovascular: Positive for chest pain.  Gastrointestinal: Positive for abdominal pain.  All other systems reviewed and are negative.    Physical Exam Updated Vital Signs BP 124/72   Pulse 97   Temp 98.2 F (36.8 C) (Oral)   Resp 23   Ht 5\' 7"  (1.702 m)   Wt 69.4 kg   SpO2 98%   BMI 23.96 kg/m   Physical Exam  Constitutional: She is oriented to person, place, and time. She appears well-developed and well-nourished. No distress.  HENT:  Head: Normocephalic and atraumatic.  Mouth/Throat: Uvula is  midline, oropharynx is clear and moist and mucous membranes are normal. No oropharyngeal exudate, posterior oropharyngeal edema, posterior oropharyngeal erythema or tonsillar abscesses. No tonsillar exudate.  Eyes: Conjunctivae and EOM are normal. Right eye exhibits no discharge. Left eye exhibits no discharge. No scleral icterus.  Neck: Normal range of motion. Neck supple.  Cardiovascular: Normal rate, regular rhythm, normal heart sounds and intact distal pulses.   Pulmonary/Chest: Effort normal and breath sounds normal. No respiratory distress. She has no wheezes. She has no rales. She exhibits tenderness (mild TTP over midsternal chest wall). She exhibits no laceration, no crepitus, no edema, no deformity, no swelling and no retraction.  Abdominal: Soft. Bowel sounds are normal. She exhibits no distension and no mass. There is no tenderness. There is  no rebound and no guarding. No hernia.  Musculoskeletal: Normal range of motion. She exhibits no edema.  Pt reports pain all over with light palpation.  Lymphadenopathy:    She has no cervical adenopathy.  Neurological: She is alert and oriented to person, place, and time. She has normal strength. No sensory deficit. GCS eye subscore is 4. GCS verbal subscore is 5. GCS motor subscore is 6.  Skin: Skin is warm and dry. No rash noted. She is not diaphoretic.  Nursing note and vitals reviewed.    ED Treatments / Results  Labs (all labs ordered are listed, but only abnormal results are displayed) Labs Reviewed  CBC WITH DIFFERENTIAL/PLATELET - Abnormal; Notable for the following:       Result Value   RBC 3.22 (*)    Hemoglobin 10.0 (*)    HCT 30.2 (*)    Neutro Abs 8.0 (*)    Lymphs Abs 0.6 (*)    All other components within normal limits  COMPREHENSIVE METABOLIC PANEL - Abnormal; Notable for the following:    Potassium 6.0 (*)    Chloride 112 (*)    CO2 15 (*)    Glucose, Bld 146 (*)    BUN 39 (*)    Creatinine, Ser 1.25 (*)    Albumin  2.9 (*)    GFR calc non Af Amer 37 (*)    GFR calc Af Amer 43 (*)    All other components within normal limits  URINALYSIS, ROUTINE W REFLEX MICROSCOPIC - Abnormal; Notable for the following:    Color, Urine AMBER (*)    Bilirubin Urine SMALL (*)    All other components within normal limits  LIPASE, BLOOD  TROPONIN I    EKG  EKG Interpretation  Date/Time:  Friday April 03 2016 14:02:49 EST Ventricular Rate:  96 PR Interval:  166 QRS Duration: 122 QT Interval:  388 QTC Calculation: 490 R Axis:   -70 Text Interpretation:  Normal sinus rhythm Left axis deviation Right bundle branch block Abnormal ECG similar to prior EKG  Confirmed by LIU MD, DANA [phone]) on 04/03/2016 2:10:52 PM       Radiology Dg Chest 2 View  Result Date: 04/03/2016 CLINICAL DATA:  Chest pain. EXAM: CHEST  2 VIEW COMPARISON:  03/18/2016 FINDINGS: The cardiac silhouette remains mildly enlarged. Extensive architectural distortion is again seen in the lung apices with volume loss, bronchiectasis, and pleuroparenchymal scarring, similar to the prior study. Previously described mild opacity in the right mid lung is stable to mildly improved compared to the most recent prior examination though still appears increased compared to an older baseline study of 12/19/2015. No definite new airspace consolidation, overt pulmonary edema, pleural effusion, or pneumothorax is identified. No acute osseous abnormality is seen. IMPRESSION: 1. Stable to slightly improved right midlung opacity. 2. Chronic interstitial scratch sec chronic upper lobe predominant interstitial lung disease. 3. No new abnormality. Electronically Signed   By: Sebastian Ache M.D.   On: 04/03/2016 16:04    Procedures Procedures (including critical care time)  Medications Ordered in ED Medications  sodium polystyrene (KAYEXALATE) 15 GM/60ML suspension 15 g (not administered)  albuterol (PROVENTIL) (2.5 MG/3ML) 0.083% nebulizer solution 5 mg (not  administered)  calcium gluconate 1 g in sodium chloride 0.9 % 100 mL IVPB (1 g Intravenous New Bag/Given 04/03/16 1635)  0.9 %  sodium chloride infusion ( Intravenous New Bag/Given 04/03/16 1635)     Initial Impression / Assessment and Plan / ED Course  I have  reviewed the triage vital signs and the nursing notes.  Pertinent labs & imaging results that were available during my care of the patient were reviewed by me and considered in my medical decision making (see chart for details).    Patient is a 83 year old female who presents to the ED accompanied by her family from home with complaint of abdominal pain. Patient's daughter reports she began plain abdominal pain last night. When questioning the patient regarding her pain she points to the middle of her chest to locate her pain. She states pain is intermittent, denies any aggravating or alleviating factors. Daughter reports since patient has been discharged from the hospital approximately 2 weeks ago for influenza A she has been more weak and has not been as active as she was prior to her admission. Denies fever, difficulty breathing, vomiting, diarrhea, urinary symptoms, leg swelling. VSS. Exam revealed frail elderly-appearing female in no acute distress. Patient reports pain all over her body with light palpation. Tenderness to palpation over midsternal chest wall. No focal abdominal tenderness present. Remaining exam unremarkable. Patient alert and oriented 3.  Chart review shows patient was admitted on 03/14/16 for suspected community acquired pneumonia and influenza which she was treated for. She was discharged home on 03/19/16.  EKG shows normal sinus rhythm with left axis deviation and right bundle-branch brought, no syncope changes from prior. Troponin negative. Hgb 10 (previous hgb during most recent admission 11). K6. BUN 39, Cr 1.25 (baseline BUN 10, Cr 0.98). CXR without any new abnormality. Discussed pt with Dr. Donnald Garre. Plan to start pt  on IVF, give albuterol neb, calcium gluconate and Kayexalate for hyperkalemia. Plan to admit pt for further management. On reevaluation, pt is resting comfortably in bed and denies any pain or complaints. Family at bedside reports they are concerned regarding pt's abdominal pain and are requesting CT. Daughter now reports they were evaluated by pt's PCP PTA for evaluation of abdominal pain. Although is not complaining of specific abdominal pain and no focal abdominal tenderness on exam, will order CT abdomen for further evaluation of abdominal pain due to pt with inconsistent exam/history. Consulted hospitalist for admission. I spoke with Dr. Roda Shutters who advised that they will accept the pt for admission but requests to re-consult her for admission orders once the CT abdomen is completed. Discussed results and plan for admission with pt and family.  Hand-off to Mathews Robinsons, PA-C. Plan to re-consult Dr. Roda Shutters with CT results.   Final Clinical Impressions(s) / ED Diagnoses   Final diagnoses:  Hyperkalemia  Renal insufficiency    New Prescriptions New Prescriptions   No medications on file     Barrett Henle, PA-C 04/03/16 1653    Arby Barrette, MD 04/18/16 [phone]

## 2016-04-03 NOTE — Progress Notes (Signed)
Elizabeth MartenVerna Terrellis a 83 y.o.femalewith medical history significant of HTN, HLD, DM type II, CKDstage III , diastolic chf andsarcoidosis who was recently admitted to the hospital with falls and found to have flu and CAP, family declined SNF placement from recent hospitalization. she returned home with home health, however, family report patient has continue to be weak with poor oral intake, she was evaluated by pmd today for hospital discharge follow up, due to patient 's c/o abdominal pain and FTT, patient is  Ref[facility]. ED course: her vital is stable, ct ab no acute findings, lab with hyperkalemia potassium 6, elevated cr at 1.25, ua no infection, ekg no acute changes, per EDP, patient is clinically dry, she is treated with ivf, kayexalate, [facility] hospitalist called to admit patient for dehydration, hyperkalemia, aki, FTT. Likely will need SNF placement. Patient is accepted to med tele bed.

## 2016-04-03 NOTE — H&P (Signed)
History and Physical    Elizabeth Keller RUE:[id number] DOB: 09/18/1923 DOA: 04/03/2016  PCP: Lemont Fillers., NP   Patient coming from: Home/[facility].  Chief Complaint: Abdominal pain  HPI: Elizabeth Keller is a 83 y.o. female with medical history significant of osteoarthritis, asthma, degenerative disc disease, spinal stenosis, type 2 diabetes, GERD, hypercholesterolemia, hypertension, IBS, sarcoidosis coming to the hospital from [facility] after presenting there with complaints of abdominal pain since yesterday evening.  Per patient's daughter, the patient started having abdominal pain yesterday evening. The pain is burning in nature and radiates to her chest. She denies dyspnea, palpitations, diaphoresis, PND, orthopnea, nausea, emesis, diarrhea, constipation, melena or hematochezia. No dysuria or frequency. However, the patient has been having decreased appetite. Her daughter states that the patient has not been eating or drinking as much as she used to prior to the last hospitalizations and her appetite has decreased. They have been trying to encourage her to eat and have been given her protein shakes.   Per patient's daughter, her mental status is about the same, but the patient's exercise capacity has diminished after the hospitalization. They are encouraging her to ambulate and the patient is getting physical therapy at home, but has not made much progress yet.  ED Course: The patient received normal saline, Kayexalate, albuterol nebulizer and calcium gluconate for a potassium level of 6.0. EKG showed left axis deviation and right bundle branch block which are not new. Her troponin level was normal, lipase was 24, sodium 139, potassium 6.0, chloride 112, CO2 was 15, BUN 39, creatinine 1.25 and glucose 146. LFTs were normal, except for albumin of 2.9 g/dL. UA showed small hemoglobin, but no other significant abnormality. Chest radiograph showed stable to improving right middle lobe opacity.  Imaging:  CT of the abdomen did not show any acute pathology, but showed aortic atherosclerosis.  Review of Systems: As per HPI otherwise 10 point review of systems negative.    Past Medical History:  Diagnosis Date  . Arthritis   . Asthma   . Degenerative disk disease   . Diabetes mellitus   . GERD (gastroesophageal reflux disease)   . Hypercholesteremia   . Hypertension   . IBS (irritable bowel syndrome)   . Sarcoidosis (HCC)   . Spinal stenosis     Past Surgical History:  Procedure Laterality Date  . EYE SURGERY  2017   cataract / glaucoma     reports that she has quit smoking. She has never used smokeless tobacco. She reports that she does not drink alcohol or use drugs.  Allergies  Allergen Reactions  . Dicyclomine Other (See Comments)    dizziness  . Gabapentin Other (See Comments)    dizziness  . Metformin And Related Nausea And Vomiting  . Lyrica [Pregabalin] Other (See Comments)  . Topiramate Other (See Comments)    Family History  Problem Relation Age of Onset  . Hypertension Other     Prior to Admission medications   Medication Sig Start Date End Date Taking? Authorizing Provider  albuterol (PROVENTIL HFA;VENTOLIN HFA) 108 (90 Base) MCG/ACT inhaler Inhale into the lungs every 6 (six) hours as needed for wheezing or shortness of breath.    Historical Provider, MD  albuterol (PROVENTIL) (2.5 MG/3ML) 0.083% nebulizer solution Take 2.5 mg by nebulization every 6 (six) hours as needed for wheezing or shortness of breath.    Historical Provider, MD  aspirin 81 MG tablet Take 81 mg by mouth daily.    Historical Provider, MD  calcium carbonate (  TUMS - DOSED IN MG ELEMENTAL CALCIUM) 500 MG chewable tablet Chew 1 tablet by mouth daily as needed for indigestion or heartburn.    Historical Provider, MD  cholecalciferol (VITAMIN D) 1000 units tablet Take 1,000 Units by mouth daily.    Historical Provider, MD  clonazePAM (KLONOPIN) 0.5 MG tablet Take 0.25-0.5 mg by mouth 2 (two)  times daily as needed. 1/2 tab q am; whole tab at 2pm; may take 1/2 tab between morning and 2pm dose, then delay 2pm dose till 7pm For anxiety    Historical Provider, MD  diclofenac sodium (VOLTAREN) 1 % GEL Apply 2 g topically as needed.    Historical Provider, MD  DULoxetine HCl (CYMBALTA PO) Take 60 mg by mouth daily.     Historical Provider, MD  glipiZIDE (GLUCOTROL) 10 MG tablet Take 5-10 mg by mouth daily before breakfast. Takes 1 tab in am and 1/2 tab in pm    Historical Provider, MD  lidocaine (LIDODERM) 5 % Place 1 patch onto the skin daily as needed for pain. 12/13/13   Historical Provider, MD  megestrol (MEGACE) 20 MG tablet Take 1 tablet (20 mg total) by mouth daily. 04/03/16   Sandford Craze, NP  metFORMIN (GLUCOPHAGE) 500 MG tablet Take 500 mg by mouth daily with breakfast.     Historical Provider, MD  mirtazapine (REMERON) 30 MG tablet Take 30 mg by mouth at bedtime.    Historical Provider, MD  NIFEdipine (PROCARDIA-XL/ADALAT CC) 30 MG 24 hr tablet Take 30 mg by mouth daily.    Historical Provider, MD  omeprazole (PRILOSEC) 20 MG capsule Take 40 mg by mouth daily.     Historical Provider, MD  polyethylene glycol (MIRALAX / GLYCOLAX) packet Take 17 g by mouth daily.    Historical Provider, MD  potassium chloride SA (K-DUR,KLOR-CON) 20 MEQ tablet Take 20 mEq by mouth daily.    Historical Provider, MD  Probiotic Product (PROBIOTIC FORMULA) CAPS Take 1 capsule by mouth daily.    Historical Provider, MD  simvastatin (ZOCOR) 40 MG tablet Take 40 mg by mouth every evening.    Historical Provider, MD  torsemide (DEMADEX) 5 MG tablet Take 5 mg by mouth daily.    Historical Provider, MD    Physical Exam:  Constitutional: NAD, calm, comfortable Vitals:   04/03/16 1900 04/03/16 1920 04/03/16 1930 04/03/16 2100  BP: 128/58 124/62 132/68 (!) 104/56  Pulse:  77 112 (!) 103  Resp: 21 24  20   Temp:   98 F (36.7 C) 98.1 F (36.7 C)  TempSrc:   Oral Oral  SpO2: 97% 95%  99%  Weight:     62.3 kg (137 lb 6.4 oz)  Height:       Eyes: PERRL, lids and conjunctivae normal ENMT: Mucous membranes and lips are mildly dry. Posterior pharynx clear of any exudate or lesions. Neck: normal, supple, no masses, no thyromegaly Respiratory: clear to auscultation bilaterally, no wheezing, no crackles. Normal respiratory effort. No accessory muscle use.  Cardiovascular: Regular rate and rhythm, no murmurs / rubs / gallops. No extremity edema. 2+ pedal pulses. No carotid bruits.  Abdomen: Bowel sounds positive. Soft, mild epigastric tenderness, no guarding/rebound/masses palpated. No hepatosplenomegaly.  Musculoskeletal: no clubbing / cyanosis. Good ROM, no contractures. Normal muscle tone.  Skin: Positive ecchymosis hand. Neurologic: CN 2-12 grossly intact. Sensation intact, DTR normal. Strength 5/5 in all 4.  Psychiatric: Normal judgment and insight. Alert and oriented x 3.   Labs on Admission: I have personally reviewed following labs  and imaging studies  CBC:  Recent Labs Lab 04/03/16 1435  WBC 9.8  NEUTROABS 8.0*  HGB 10.0*  HCT 30.2*  MCV 93.8  PLT 342   Basic Metabolic Panel:  Recent Labs Lab 04/03/16 1435  NA 139  K 6.0*  CL 112*  CO2 15*  GLUCOSE 146*  BUN 39*  CREATININE 1.25*  CALCIUM 9.1   GFR: Estimated Creatinine Clearance: 29.1 mL/min (by C-G formula based on SCr of 1.25 mg/dL (H)). Liver Function Tests:  Recent Labs Lab 04/03/16 1435  AST 25  ALT 26  ALKPHOS 84  BILITOT 0.9  PROT 6.9  ALBUMIN 2.9*    Recent Labs Lab 04/03/16 1435  LIPASE 24   No results for input(s): AMMONIA in the last 168 hours. Coagulation Profile: No results for input(s): INR, PROTIME in the last 168 hours. Cardiac Enzymes:  Recent Labs Lab 04/03/16 1435  TROPONINI <0.03   BNP (last 3 results) No results for input(s): PROBNP in the last 8760 hours. HbA1C: No results for input(s): HGBA1C in the last 72 hours. CBG: No results for input(s): GLUCAP in the last  168 hours. Lipid Profile: No results for input(s): CHOL, HDL, LDLCALC, TRIG, CHOLHDL, LDLDIRECT in the last 72 hours. Thyroid Function Tests: No results for input(s): TSH, T4TOTAL, FREET4, T3FREE, THYROIDAB in the last 72 hours. Anemia Panel: No results for input(s): VITAMINB12, FOLATE, FERRITIN, TIBC, IRON, RETICCTPCT in the last 72 hours. Urine analysis:    Component Value Date/Time   COLORURINE AMBER (A) 04/03/2016 1507   APPEARANCEUR CLEAR 04/03/2016 1507   LABSPEC 1.017 04/03/2016 1507   PHURINE 6.0 04/03/2016 1507   GLUCOSEU NEGATIVE 04/03/2016 1507   HGBUR NEGATIVE 04/03/2016 1507   BILIRUBINUR SMALL (A) 04/03/2016 1507   KETONESUR NEGATIVE 04/03/2016 1507   PROTEINUR NEGATIVE 04/03/2016 1507   UROBILINOGEN 0.2 10/18/2014 2310   NITRITE NEGATIVE 04/03/2016 1507   LEUKOCYTESUR NEGATIVE 04/03/2016 1507    Radiological Exams on Admission: Ct Abdomen Pelvis Wo Contrast  Result Date: 04/03/2016 CLINICAL DATA:  The lung bases are clear. EXAM: CT ABDOMEN AND PELVIS WITHOUT CONTRAST TECHNIQUE: Multidetector CT imaging of the abdomen and pelvis was performed following the standard protocol without IV contrast. COMPARISON:  12/01/2013 FINDINGS: Lower chest: No acute abnormality. Hepatobiliary: No focal liver abnormality is seen. No gallstones, gallbladder wall thickening, or biliary dilatation. Pancreas: Unremarkable. No pancreatic ductal dilatation or surrounding inflammatory changes. Spleen: Normal in size without focal abnormality. Adrenals/Urinary Tract: The adrenal glands are normal. No kidney mass or hydronephrosis identified. Urinary bladder is normal. Stomach/Bowel: The stomach is normal. The small bowel loops have a normal course and caliber without evidence for a bowel obstruction. Vascular/Lymphatic: Aortic atherosclerosis. No aneurysm. No upper abdominal or pelvic adenopathy. No inguinal adenopathy. Reproductive: Uterus and bilateral adnexa are unremarkable. Other: No abdominal  wall hernia or abnormality. No abdominopelvic ascites. Musculoskeletal: No acute or significant osseous abnormality peer IMPRESSION: 1. No acute findings within the abdomen or pelvis. 2. Aortic atherosclerosis. Electronically Signed   By: Signa Kell M.D.   On: 04/03/2016 17:50   Dg Chest 2 View  Result Date: 04/03/2016 CLINICAL DATA:  Chest pain. EXAM: CHEST  2 VIEW COMPARISON:  03/18/2016 FINDINGS: The cardiac silhouette remains mildly enlarged. Extensive architectural distortion is again seen in the lung apices with volume loss, bronchiectasis, and pleuroparenchymal scarring, similar to the prior study. Previously described mild opacity in the right mid lung is stable to mildly improved compared to the most recent prior examination though still appears increased  compared to an older baseline study of 12/19/2015. No definite new airspace consolidation, overt pulmonary edema, pleural effusion, or pneumothorax is identified. No acute osseous abnormality is seen. IMPRESSION: 1. Stable to slightly improved right midlung opacity. 2. Chronic interstitial scratch sec chronic upper lobe predominant interstitial lung disease. 3. No new abnormality. Electronically Signed   By: Sebastian AcheAllen  Grady M.D.   On: 04/03/2016 16:04   03/17/2016 Echo complete ------------------------------------------------------------------- LV EF: 55% -   60%  ------------------------------------------------------------------- Indications:      Dyspnea 786.09.  ------------------------------------------------------------------- History:   PMH:  Spinal stenosis. Sarcoidosis.  Risk factors: Hypertension. Diabetes mellitus. Dyslipidemia.  ------------------------------------------------------------------- Study Conclusions  - Left ventricle: The cavity size was normal. Systolic function was   normal. The estimated ejection fraction was in the range of 55%   to 60%. There is hypokinesis of the apical myocardium. Doppler    parameters are consistent with abnormal left ventricular   relaxation (grade 1 diastolic dysfunction). - Aortic valve: Trileaflet; moderately thickened, moderately   calcified leaflets. - Mitral valve: Mild prolapse, involving the anterior leaflet. - Pulmonic valve: There was moderate regurgitation. - Pulmonary arteries: Systolic pressure was mildly increased. PA   peak pressure: 35 mm Hg (S).   EKG: Independently reviewed. Vent. rate 96 BPM PR interval 166 ms QRS duration 122 ms QT/QTc 388/490 ms P-R-T axes 104 -70 18 Normal sinus rhythm Left axis deviation Right bundle branch block Abnormal ECG  Assessment/Plan Principal Problem:   Hyperkalemia Admit to telemetry/inpatient. Continue IV hydration. The patient was given Kayexalate, calcium gluconate and albuterol nebulizer earlier. Follow up potassium level.  Active Problems:   Atypical chest pain No changes on EKG. Continue cardiac monitoring. Trend troponin levels. Echo was done in 17 days ago.    Abdominal pain   GERD (gastroesophageal reflux disease) Single-dose famotidine 20 mg IVP. Continue daily PPI.    Diabetes mellitus type II, controlled (HCC) Carbohydrate modified diet. Continue metformin 500 mg by mouth daily. Continue  glipizide 10 mg by mouth in the morning and 5 mg in the evening.    Anemia Monitor hematocrit and hemoglobin.    Hypertension Continue nifedipine 30 mg by mouth daily. Continue torsemide 5 mg by mouth daily.    Pressure injury of skin Continue local care.    Asthma Asymptomatic at this time. Supplemental oxygen and bronchodilators as needed.    Hyperlipidemia Continue simvastatin 40 mg by mouth daily. Transaminases were within normal limits earlier.    DVT prophylaxis: SCDs. Code Status: DNR/DNI. Family Communication: Her daughter was present in the room. Disposition Plan: Admit for hyperkalemia treatment and troponin level trending. Consults called:  Admission  status: Observation/Telemetry.   Bobette Moavid Manuel Ortiz MD [facility] Pager [phone].  If 7PM-7AM, please contact night-coverage [email or web address] Password [email or web address]  04/03/2016, 9:36 PM

## 2016-04-03 NOTE — Patient Instructions (Signed)
Please proceed to the ER on the first floor.  

## 2016-04-03 NOTE — ED Notes (Signed)
IV attempted x2 without success.

## 2016-04-03 NOTE — ED Notes (Signed)
Pt's family called out for assistance with toileting.   They first told me we should use a BSC.  I got that and then they said she could not get up so I should use a bedpan.  I attempted to use a bedpan and she screamed every time she was moved slightly.  Daughter finally said that she had not been able to get up to bsc or walk for a week and she had been letting her use the diaper.  She then told me that we should just let her go in the diaper.  She has one on so she will let us know when she needs to be changed.

## 2016-04-04 DIAGNOSIS — N183 Chronic kidney disease, stage 3 (moderate): Secondary | ICD-10-CM

## 2016-04-04 DIAGNOSIS — D638 Anemia in other chronic diseases classified elsewhere: Secondary | ICD-10-CM | POA: Diagnosis present

## 2016-04-04 DIAGNOSIS — L89302 Pressure ulcer of unspecified buttock, stage 2: Secondary | ICD-10-CM | POA: Diagnosis not present

## 2016-04-04 DIAGNOSIS — R1084 Generalized abdominal pain: Secondary | ICD-10-CM | POA: Diagnosis not present

## 2016-04-04 DIAGNOSIS — Z9181 History of falling: Secondary | ICD-10-CM | POA: Diagnosis not present

## 2016-04-04 DIAGNOSIS — E1122 Type 2 diabetes mellitus with diabetic chronic kidney disease: Secondary | ICD-10-CM | POA: Diagnosis present

## 2016-04-04 DIAGNOSIS — K219 Gastro-esophageal reflux disease without esophagitis: Secondary | ICD-10-CM | POA: Diagnosis present

## 2016-04-04 DIAGNOSIS — D869 Sarcoidosis, unspecified: Secondary | ICD-10-CM | POA: Diagnosis present

## 2016-04-04 DIAGNOSIS — M1990 Unspecified osteoarthritis, unspecified site: Secondary | ICD-10-CM | POA: Diagnosis present

## 2016-04-04 DIAGNOSIS — L89322 Pressure ulcer of left buttock, stage 2: Secondary | ICD-10-CM | POA: Diagnosis present

## 2016-04-04 DIAGNOSIS — Z7984 Long term (current) use of oral hypoglycemic drugs: Secondary | ICD-10-CM | POA: Diagnosis not present

## 2016-04-04 DIAGNOSIS — E1121 Type 2 diabetes mellitus with diabetic nephropathy: Secondary | ICD-10-CM | POA: Diagnosis present

## 2016-04-04 DIAGNOSIS — I129 Hypertensive chronic kidney disease with stage 1 through stage 4 chronic kidney disease, or unspecified chronic kidney disease: Secondary | ICD-10-CM | POA: Diagnosis present

## 2016-04-04 DIAGNOSIS — E7800 Pure hypercholesterolemia, unspecified: Secondary | ICD-10-CM | POA: Diagnosis present

## 2016-04-04 DIAGNOSIS — K589 Irritable bowel syndrome without diarrhea: Secondary | ICD-10-CM | POA: Diagnosis present

## 2016-04-04 DIAGNOSIS — Z66 Do not resuscitate: Secondary | ICD-10-CM | POA: Diagnosis present

## 2016-04-04 DIAGNOSIS — Z888 Allergy status to other drugs, medicaments and biological substances status: Secondary | ICD-10-CM | POA: Diagnosis not present

## 2016-04-04 DIAGNOSIS — I700 Atherosclerosis of aorta: Secondary | ICD-10-CM | POA: Diagnosis present

## 2016-04-04 DIAGNOSIS — L89312 Pressure ulcer of right buttock, stage 2: Secondary | ICD-10-CM | POA: Diagnosis present

## 2016-04-04 DIAGNOSIS — Z87891 Personal history of nicotine dependence: Secondary | ICD-10-CM | POA: Diagnosis not present

## 2016-04-04 DIAGNOSIS — E875 Hyperkalemia: Secondary | ICD-10-CM | POA: Diagnosis not present

## 2016-04-04 DIAGNOSIS — I4510 Unspecified right bundle-branch block: Secondary | ICD-10-CM | POA: Diagnosis present

## 2016-04-04 DIAGNOSIS — Z8249 Family history of ischemic heart disease and other diseases of the circulatory system: Secondary | ICD-10-CM | POA: Diagnosis not present

## 2016-04-04 DIAGNOSIS — J45909 Unspecified asthma, uncomplicated: Secondary | ICD-10-CM | POA: Diagnosis present

## 2016-04-04 DIAGNOSIS — E1129 Type 2 diabetes mellitus with other diabetic kidney complication: Secondary | ICD-10-CM | POA: Diagnosis present

## 2016-04-04 DIAGNOSIS — E785 Hyperlipidemia, unspecified: Secondary | ICD-10-CM | POA: Diagnosis present

## 2016-04-04 LAB — CBC
HEMATOCRIT: 28.3 % — AB (ref 36.0–46.0)
HEMOGLOBIN: 9.1 g/dL — AB (ref 12.0–15.0)
MCH: 29.5 pg (ref 26.0–34.0)
MCHC: 32.2 g/dL (ref 30.0–36.0)
MCV: 91.9 fL (ref 78.0–100.0)
Platelets: 318 10*3/uL (ref 150–400)
RBC: 3.08 MIL/uL — ABNORMAL LOW (ref 3.87–5.11)
RDW: 14.9 % (ref 11.5–15.5)
WBC: 8.7 10*3/uL (ref 4.0–10.5)

## 2016-04-04 LAB — BASIC METABOLIC PANEL
ANION GAP: 10 (ref 5–15)
ANION GAP: 13 (ref 5–15)
BUN: 32 mg/dL — ABNORMAL HIGH (ref 6–20)
BUN: 26 mg/dL — ABNORMAL HIGH (ref 6–20)
CHLORIDE: 112 mmol/L — AB (ref 101–111)
CHLORIDE: 112 mmol/L — AB (ref 101–111)
CO2: 17 mmol/L — AB (ref 22–32)
CO2: 18 mmol/L — ABNORMAL LOW (ref 22–32)
CREATININE: 1.27 mg/dL — AB (ref 0.44–1.00)
Calcium: 9.5 mg/dL (ref 8.9–10.3)
Calcium: 9.2 mg/dL (ref 8.9–10.3)
Creatinine, Ser: 0.96 mg/dL (ref 0.44–1.00)
GFR calc Af Amer: 42 mL/min — ABNORMAL LOW (ref >60)
GFR calc Af Amer: 59 mL/min — ABNORMAL LOW (ref >60)
GFR calc non Af Amer: 36 mL/min — ABNORMAL LOW (ref >60)
GFR, EST NON AFRICAN AMERICAN: 51 mL/min — AB (ref >60)
Glucose, Bld: 110 mg/dL — ABNORMAL HIGH (ref 65–99)
Glucose, Bld: 128 mg/dL — ABNORMAL HIGH (ref 65–99)
POTASSIUM: 5.9 mmol/L — AB (ref 3.5–5.1)
POTASSIUM: 5.2 mmol/L — AB (ref 3.5–5.1)
SODIUM: 139 mmol/L (ref 135–145)
SODIUM: 143 mmol/L (ref 135–145)

## 2016-04-04 LAB — GLUCOSE, CAPILLARY
GLUCOSE-CAPILLARY: 129 mg/dL — AB (ref 65–99)
Glucose-Capillary: 133 mg/dL — ABNORMAL HIGH (ref 65–99)
Glucose-Capillary: 129 mg/dL — ABNORMAL HIGH (ref 65–99)
Glucose-Capillary: 119 mg/dL — ABNORMAL HIGH (ref 65–99)

## 2016-04-04 LAB — MAGNESIUM: MAGNESIUM: 2.5 mg/dL — AB (ref 1.7–2.4)

## 2016-04-04 LAB — TROPONIN I: Troponin I: <0.03 ng/mL (ref <0.03)

## 2016-04-04 MED ORDER — SODIUM CHLORIDE 0.9 % IV SOLN
INTRAVENOUS | Status: DC
  Administered 2016-04-05: 08:00:00 via INTRAVENOUS

## 2016-04-04 MED ORDER — METFORMIN HCL 500 MG PO TABS
500.0000 mg | ORAL_TABLET | Freq: Every day | ORAL | Status: DC
  Administered 2016-04-04 – 2016-04-06 (×3): 500 mg via ORAL
  Filled 2016-04-04 (×3): qty 1

## 2016-04-04 MED ORDER — FAMOTIDINE IN NACL 20-0.9 MG/50ML-% IV SOLN: 20.0000 mg | Freq: Once | INTRAVENOUS | Status: DC

## 2016-04-04 MED ORDER — PANTOPRAZOLE SODIUM 40 MG PO TBEC
40.0000 mg | DELAYED_RELEASE_TABLET | Freq: Every day | ORAL | Status: DC
  Administered 2016-04-04 – 2016-04-06 (×3): 40 mg via ORAL
  Filled 2016-04-04 (×3): qty 1

## 2016-04-04 MED ORDER — MIRTAZAPINE 15 MG PO TABS
30.0000 mg | ORAL_TABLET | Freq: Every day | ORAL | Status: DC
  Administered 2016-04-04 – 2016-04-05 (×2): 30 mg via ORAL
  Filled 2016-04-04 (×2): qty 2

## 2016-04-04 MED ORDER — DULOXETINE HCL 60 MG PO CPEP: 60.0000 mg | ORAL_CAPSULE | Freq: Every day | ORAL | Status: DC

## 2016-04-04 MED ORDER — ONDANSETRON HCL 4 MG/2ML IJ SOLN: 4.0000 mg | Freq: Four times a day (QID) | INTRAMUSCULAR | Status: DC | PRN

## 2016-04-04 MED ORDER — TORSEMIDE 5 MG PO TABS
5.0000 mg | ORAL_TABLET | Freq: Every day | ORAL | Status: DC
  Administered 2016-04-04 – 2016-04-06 (×3): 5 mg via ORAL
  Filled 2016-04-04 (×3): qty 1

## 2016-04-04 MED ORDER — SIMVASTATIN 40 MG PO TABS
40.0000 mg | ORAL_TABLET | Freq: Every evening | ORAL | Status: DC
  Administered 2016-04-04 – 2016-04-05 (×2): 40 mg via ORAL
  Filled 2016-04-04 (×2): qty 1

## 2016-04-04 MED ORDER — ONDANSETRON HCL 4 MG PO TABS: 4.0000 mg | ORAL_TABLET | Freq: Four times a day (QID) | ORAL | Status: DC | PRN

## 2016-04-04 MED ORDER — ASPIRIN EC 81 MG PO TBEC
81.0000 mg | DELAYED_RELEASE_TABLET | Freq: Every day | ORAL | Status: DC
  Administered 2016-04-04 – 2016-04-06 (×3): 81 mg via ORAL
  Filled 2016-04-04 (×3): qty 1

## 2016-04-04 MED ORDER — MEGESTROL ACETATE 20 MG PO TABS
20.0000 mg | ORAL_TABLET | Freq: Every day | ORAL | Status: DC
  Administered 2016-04-04 – 2016-04-06 (×3): 20 mg via ORAL
  Filled 2016-04-04 (×3): qty 1

## 2016-04-04 MED ORDER — GLIPIZIDE 5 MG PO TABS: 5.0000 mg | ORAL_TABLET | Freq: Every day | ORAL | Status: DC

## 2016-04-04 MED ORDER — GLIPIZIDE 10 MG PO TABS
10.0000 mg | ORAL_TABLET | Freq: Every day | ORAL | Status: DC
  Filled 2016-04-04 (×2): qty 1

## 2016-04-04 MED ORDER — GLIPIZIDE 5 MG PO TABS
5.0000 mg | ORAL_TABLET | Freq: Every day | ORAL | Status: DC
  Filled 2016-04-04 (×3): qty 1

## 2016-04-04 MED ORDER — SODIUM CHLORIDE 0.9 % IV SOLN
Freq: Once | INTRAVENOUS | Status: AC
  Administered 2016-04-04: 09:00:00 via INTRAVENOUS

## 2016-04-04 MED ORDER — CLONAZEPAM 0.5 MG PO TABS
0.2500 mg | ORAL_TABLET | Freq: Three times a day (TID) | ORAL | Status: DC | PRN
  Administered 2016-04-05 – 2016-04-06 (×3): 0.25 mg via ORAL
  Filled 2016-04-04 (×3): qty 1

## 2016-04-04 MED ORDER — CALCIUM CARBONATE ANTACID 500 MG PO CHEW: 1.0000 | CHEWABLE_TABLET | Freq: Every day | ORAL | Status: DC | PRN

## 2016-04-04 MED ORDER — SODIUM CHLORIDE 0.9 % IV SOLN
Freq: Once | INTRAVENOUS | Status: AC
  Administered 2016-04-04: 11:00:00 via INTRAVENOUS

## 2016-04-04 MED ORDER — POLYETHYLENE GLYCOL 3350 17 G PO PACK
17.0000 g | PACK | Freq: Every day | ORAL | Status: DC
  Administered 2016-04-04 – 2016-04-06 (×3): 17 g via ORAL
  Filled 2016-04-04 (×3): qty 1

## 2016-04-04 MED ORDER — ALBUTEROL SULFATE (2.5 MG/3ML) 0.083% IN NEBU: 2.5000 mg | INHALATION_SOLUTION | Freq: Four times a day (QID) | RESPIRATORY_TRACT | Status: DC | PRN

## 2016-04-04 MED ORDER — NIFEDIPINE ER OSMOTIC RELEASE 30 MG PO TB24
30.0000 mg | ORAL_TABLET | Freq: Every day | ORAL | Status: DC
  Administered 2016-04-04 – 2016-04-06 (×3): 30 mg via ORAL
  Filled 2016-04-04 (×3): qty 1

## 2016-04-04 NOTE — Plan of Care (Signed)
Problem: Education: Goal: Knowledge of [facility] Education information/materials will improve Outcome: Not Met (add Reason) Pt alert to self only

## 2016-04-04 NOTE — Progress Notes (Addendum)
Patient ID: Elizabeth Keller, female   DOB: 02-14-1923, 83 y.o.   MRN: [id number]  PROGRESS NOTE    Reilley Valentine  JXB:[id number] DOB: 1923/05/05 DOA: 04/03/2016  PCP: Lemont Fillers., NP   Brief Narrative:  83 y.o. female with medical history significant for osteoarthritis, asthma, degenerative disc disease, spinal stenosis, type 2 diabetes, GERD, hypercholesterolemia, hypertension, IBS, sarcoidosis who presented to [facility] with abdominal pain started the evening prior to the admission. Pain was burning in nature, radiating to chest area but no associated fevers, chills, nausea or vomiting. NO diarrhea or constipation. Pt did have decreased appetite for some time now.   On admission she was found to have potassium level of 6 and has gotten Kayexalate, albuterol nebulizer, calcium gluconate and her repeat potassium was 5.2. The 12 lead EKG showed left axis deviation and right bundle branch block which are not new. Her troponin level was normal, lipase was 24, sodium 139, creatinine 1.25 and glucose 146. LFTs were normal, except for albumin of 2.9 g/dL. UA showed small hemoglobin, but no other significant abnormality. Chest radiograph showed stable to improving right middle lobe opacity. CT of the abdomen did not show any acute pathology, but showed aortic atherosclerosis.  Assessment & Plan:   Principal Problem:   Hyperkalemia - She is on potassium supplementation at home and this was placed on hold - She was given Kayexalate, albuterol nebulizer, calcium gluconate on admission - Potassium is 5.2 this am - She is on torsemide so this will likely correct mild hyperkalemia this am (potassium 5.2) - No acute changes in 12 lead EKG  Active Problems:   Abdominal pain, generalized  - No acute findings on CT abdomen - No reports of abd pain this am    Diabetes mellitus type II with diabetic nephropathy without long term insulin use (HCC) - Continue metformin and glipizide    Essential  hypertension - Continue nifedipine and torsemide    CKD stage 3 - Baseline Cr in 10/2014 was 1.78 - Cr on this admission within baseline range    Anemia of chronic disease - Due to CKD - Hgb stable at 10    Dyslipidemia associated with type 2 DM - Continue Zocor     Bilateral buttock stage 2 pressure ulcer - Wound care per RN - Has barrier cream ordered    GERD (gastroesophageal reflux disease)   Asthma   Hyperlipidemia   Abdominal pain   Atypical chest pain     DVT prophylaxis: SCD's bilaterally  Code Status: DNR/DNI Family Communication: updated daughter at the bedside this am Disposition Plan: repeat potassium tomorrow and if better can possibly go home in am   Consultants:   PT  Procedures:   None   Antimicrobials:   None    Subjective: No overnight events.  Objective: Vitals:   04/03/16 1930 04/03/16 2100 04/04/16 0635 04/04/16 0958  BP: 132/68 (!) 104/56 127/65 130/62  Pulse: 112 (!) 103 (!) 103 99  Resp:  20 18 16   Temp: 98 F (36.7 C) 98.1 F (36.7 C) 98 F (36.7 C) 98.8 F (37.1 C)  TempSrc: Oral Oral Oral Oral  SpO2:  99% 98% 97%  Weight:  62.3 kg (137 lb 6.4 oz)    Height:        Intake/Output Summary (Last 24 hours) at 04/04/16 1117 Last data filed at 04/04/16 0958  Gross per 24 hour  Intake          1400.42 ml  Output  1125 ml  Net           275.42 ml   Filed Weights   04/03/16 1227 04/03/16 2100  Weight: 69.4 kg (153 lb) 62.3 kg (137 lb 6.4 oz)    Examination:  General exam: Appears calm and comfortable  Respiratory system: Clear to auscultation. Respiratory effort normal. Cardiovascular system: S1 & S2 heard, Rate controlled, soft +2/6SEM appreciated  Gastrointestinal system: Abdomen is nondistended, soft and nontender. No organomegaly or masses felt. Normal bowel sounds heard. Central nervous system: No focal neurological deficits. Extremities: Symmetric 5 x 5 power. +1 LE and pedal edema appreciated    Skin: No rashes, lesions or ulcers Psychiatry: Normal mood and behavior   Data Reviewed: I have personally reviewed following labs and imaging studies  CBC:  Recent Labs Lab 04/03/16 1435 04/04/16 0504  WBC 9.8 8.7  NEUTROABS 8.0*  --   HGB 10.0* 9.1*  HCT 30.2* 28.3*  MCV 93.8 91.9  PLT 342 318   Basic Metabolic Panel:  Recent Labs Lab 04/03/16 1435 04/03/16 2244 04/04/16 0504  NA 139 139 143  K 6.0* 5.9* 5.2*  CL 112* 112* 112*  CO2 15* 17* 18*  GLUCOSE 146* 110* 128*  BUN 39* 32* 26*  CREATININE 1.25* 1.27* 0.96  CALCIUM 9.1 9.5 9.2  MG  --  2.5*  --    GFR: Estimated Creatinine Clearance: 37.9 mL/min (by C-G formula based on SCr of 0.96 mg/dL). Liver Function Tests:  Recent Labs Lab 04/03/16 1435  AST 25  ALT 26  ALKPHOS 84  BILITOT 0.9  PROT 6.9  ALBUMIN 2.9*    Recent Labs Lab 04/03/16 1435  LIPASE 24   No results for input(s): AMMONIA in the last 168 hours. Coagulation Profile: No results for input(s): INR, PROTIME in the last 168 hours. Cardiac Enzymes:  Recent Labs Lab 04/03/16 1435 04/03/16 2105 04/04/16 0504  TROPONINI <0.03 <0.03 <0.03   BNP (last 3 results) No results for input(s): PROBNP in the last 8760 hours. HbA1C: No results for input(s): HGBA1C in the last 72 hours. CBG:  Recent Labs Lab 04/04/16 0815  GLUCAP 129*   Lipid Profile: No results for input(s): CHOL, HDL, LDLCALC, TRIG, CHOLHDL, LDLDIRECT in the last 72 hours. Thyroid Function Tests: No results for input(s): TSH, T4TOTAL, FREET4, T3FREE, THYROIDAB in the last 72 hours. Anemia Panel: No results for input(s): VITAMINB12, FOLATE, FERRITIN, TIBC, IRON, RETICCTPCT in the last 72 hours. Urine analysis:    Component Value Date/Time   COLORURINE AMBER (A) 04/03/2016 1507   APPEARANCEUR CLEAR 04/03/2016 1507   LABSPEC 1.017 04/03/2016 1507   PHURINE 6.0 04/03/2016 1507   GLUCOSEU NEGATIVE 04/03/2016 1507   HGBUR NEGATIVE 04/03/2016 1507   BILIRUBINUR  SMALL (A) 04/03/2016 1507   KETONESUR NEGATIVE 04/03/2016 1507   PROTEINUR NEGATIVE 04/03/2016 1507   UROBILINOGEN 0.2 10/18/2014 2310   NITRITE NEGATIVE 04/03/2016 1507   LEUKOCYTESUR NEGATIVE 04/03/2016 1507   Sepsis Labs: @LABRCNTIP (procalcitonin:4,lacticidven:4)   )No results found for this or any previous visit (from the past 240 hour(s)).    Radiology Studies: Ct Abdomen Pelvis Wo Contrast Result Date: 04/03/2016  1. No acute findings within the abdomen or pelvis. 2. Aortic atherosclerosis.   Dg Chest 2 View Result Date: 04/03/2016 1. Stable to slightly improved right midlung opacity. 2. Chronic interstitial scratch sec chronic upper lobe predominant interstitial lung disease. 3. No new abnormality.    Scheduled Meds: . aspirin EC  81 mg Oral Daily  .  glipiZIDE  10 mg Oral QAC breakfast  . glipiZIDE  5 mg Oral QAC supper  . megestrol  20 mg Oral Daily  .  metFORMIN  500 mg Oral Q breakfast  . mirtazapine  30 mg Oral QHS  . NIFEdipine  30 mg Oral Daily  . pantoprazole  40 mg Oral Daily  .  polyethylene glycol  17 g Oral Daily  . simvastatin  40 mg Oral QPM  . torsemide  5 mg Oral Daily   Continuous Infusions:   LOS: 0 days    Time spent: 25 minutes  Greater than 50% of the time spent on counseling and coordinating the care.   Manson PasseyEVINE, ALMA, MD [facility] Pager [phone]  If 7PM-7AM, please contact night-coverage [email or web address] Password [email or web address] 04/04/2016, 11:17 AM

## 2016-04-04 NOTE — Evaluation (Signed)
Physical Therapy Evaluation Patient Details Name: Elizabeth Keller MRN: [id number] DOB: 1923-06-08 Today's Date: 04/04/2016   History of Present Illness  Pt is a 83 yo female admitted through ED on 04/03/16 for Hyperkalemia, CKDIII, Abdominal Pain and abnormal chest pain. PMH signficant for HTN, HLD, DM 2, and sarcoidosis.   Clinical Impression  Pt presents with the above diagnosis and below deficits. Prior to admission, pt was total assist for bed mobs, transfers and all self-care. About a month ago, pt was able to perform some mobility including using a rollator and performing own transfers. Pt did, however, require assistance for self-care. Pt has make a significant decline over the past two weeks and is now Max-total assist for all mobility this session. Pt will benefit from SNF placement at discharge or placement in a long term care facility with 24hr care due to current level. Pt may benefit from palliative care consult due to current significant decline. Pt will benefit from continued acute PT services in order to maximize her functional outcomes prior to discharge to next venue of care.     Follow Up Recommendations SNF;Supervision/Assistance - 24 hour    Equipment Recommendations  None recommended by PT    Recommendations for Other Services Other (comment) (Palliative consult)     Precautions / Restrictions Precautions Precautions: Fall Restrictions Weight Bearing Restrictions: No      Mobility  Bed Mobility Overal bed mobility: Needs Assistance Bed Mobility: Supine to Sit     Supine to sit: Max assist;+2 for physical assistance;HOB elevated     General bed mobility comments: Max A with HOB elevated. pt does not initate movement  Transfers                 General transfer comment: Unable to assess as pt is unsafe EOB  Ambulation/Gait                Stairs            Wheelchair Mobility    Modified Rankin (Stroke Patients Only)       Balance  Overall balance assessment: Needs assistance Sitting-balance support: Bilateral upper extremity supported;Feet supported Sitting balance-Leahy Scale: Zero Sitting balance - Comments: Leans to right and requires Max A to maintain upright sitting.  Postural control: Right lateral lean;Posterior lean                                   Pertinent Vitals/Pain Pain Assessment: Faces Faces Pain Scale: Hurts even more Pain Location: LE's and Ue's with mobility Pain Descriptors / Indicators: Grimacing;Guarding;Moaning Pain Intervention(s): Monitored during session;Repositioned    Home Living Family/patient expects to be discharged to:: Private residence Living Arrangements: Children Available Help at Discharge: Family;Available PRN/intermittently Type of Home: House Home Access: Stairs to enter Entrance Stairs-Rails: None Entrance Stairs-Number of Steps: 3 Home Layout: One level Home Equipment: Walker - 4 wheels;Wheelchair - manual;Bedside commode      Prior Function Level of Independence: Needs assistance   Gait / Transfers Assistance Needed: Pt walked around house with 4 wheeled RW by herself grossly 30'  ADL's / Homemaking Assistance Needed: Pt was able to do her own toileting, dtrs helped with B/D every morning; family ( 6 grown kids help with IADLs)  Comments: Pt has been Max A for all mobility prior to the most current admission. Pt was at the above level prior to previous admission 03/17/16.  Hand Dominance   Dominant Hand: Right    Extremity/Trunk Assessment   Upper Extremity Assessment Upper Extremity Assessment: Difficult to assess due to impaired cognition    Lower Extremity Assessment Lower Extremity Assessment: Difficult to assess due to impaired cognition    Cervical / Trunk Assessment Cervical / Trunk Assessment: Kyphotic;Other exceptions Cervical / Trunk Exceptions: Cervical rotation and side bend to right and fixed in this positiong   Communication   Communication: No difficulties  Cognition Arousal/Alertness: Awake/alert Behavior During Therapy: WFL for tasks assessed/performed;Agitated Overall Cognitive Status: Impaired/Different from baseline Area of Impairment: Orientation;Memory;Following commands;Problem solving;Attention;Awareness Orientation Level: Disoriented to;Place;Time;Situation Current Attention Level: Selective;Alternating Memory: Decreased short-term memory Following Commands: Follows one step commands inconsistently   Awareness:  (Pre-Intellectual) Problem Solving: Slow processing;Decreased initiation;Requires verbal cues;Requires tactile cues;Difficulty sequencing General Comments: pt states she is in [place], unable to recognize where chair is in the room, difficulty following commands    General Comments General comments (skin integrity, edema, etc.): Pt's family is present. Reports current mobility is worse than previous home mobility.    Exercises     Assessment/Plan    PT Assessment Patient needs continued PT services  PT Problem List Decreased strength;Decreased range of motion;Decreased activity tolerance;Decreased balance;Decreased mobility;Decreased coordination;Decreased cognition;Decreased knowledge of use of DME;Decreased safety awareness;Pain       PT Treatment Interventions DME instruction;Functional mobility training;Therapeutic activities;Therapeutic exercise;Balance training;Patient/family education    PT Goals (Current goals can be found in the Care Plan section)  Acute Rehab PT Goals Patient Stated Goal: return home PT Goal Formulation: With patient/family Time For Goal Achievement: 04/11/16 Potential to Achieve Goals: Fair    Frequency Min 3X/week   Barriers to discharge Decreased caregiver support Will not have 24 hour care    Co-evaluation               End of Session   Activity Tolerance: Patient limited by fatigue;Treatment limited secondary to  agitation Patient left: in bed;with call bell/phone within reach;with bed alarm set;with family/visitor present Nurse Communication: Mobility status;Precautions PT Visit Diagnosis: Muscle weakness (generalized) (M62.81);Adult, failure to thrive (R62.7)         Time: [id number] PT Time Calculation (min) (ACUTE ONLY): 23 min   Charges:   PT Evaluation $PT Eval Moderate Complexity: 1 Procedure PT Treatments $Therapeutic Activity: 8-22 mins   PT G Codes:         Colin BroachSabra M. Brayton Baumgartner PT, DPT  [phone]  04/04/2016, 3:38 PM

## 2016-04-04 NOTE — Plan of Care (Signed)
Problem: Bowel/Gastric: Goal: Will not experience complications related to bowel motility Outcome: Progressing Large bowel movement last night following administration of kaexalate for hyperkalemia. Miralax administered per daily order. Will continue to monitor.

## 2016-04-04 NOTE — Plan of Care (Signed)
Problem: Nutrition: Goal: Adequate nutrition will be maintained Outcome: Progressing Megesterol administered per MD. Will continue to monitor PO intake and assist patient with meals. Per daughter patient ate about 25% of breakfast.

## 2016-04-04 NOTE — Progress Notes (Signed)
New Admission Note:  Arrival Method: By bed from ED around 2030 Mental Orientation: Alert to self Telemetry: [address], CCMD notified Assessment: Completed Skin: Completed, refer to flowsheets Iv: Left wrist NSL at 125mL Pain: Denies Tubes: None Safety Measures: Safety Fall Prevention Plan was given, discussed  Admission: Completed [address] Orientation: Patient has been orientated to the room, unit and the staff. Family: Daughter at bedside  Orders have been reviewed and implemented. Will continue to monitor the patient. Call light has been placed within reach and bed alarm has been activated.   Alfonse Rashristy Kazimierz Springborn, RN  Phone Number: [phone]

## 2016-04-05 LAB — GLUCOSE, CAPILLARY
GLUCOSE-CAPILLARY: 109 mg/dL — AB (ref 65–99)
GLUCOSE-CAPILLARY: 136 mg/dL — AB (ref 65–99)
GLUCOSE-CAPILLARY: 109 mg/dL — AB (ref 65–99)
GLUCOSE-CAPILLARY: 109 mg/dL — AB (ref 65–99)

## 2016-04-05 LAB — CBC
HEMATOCRIT: 30.6 % — AB (ref 36.0–46.0)
HEMOGLOBIN: 9.9 g/dL — AB (ref 12.0–15.0)
MCH: 30 pg (ref 26.0–34.0)
MCHC: 32.4 g/dL (ref 30.0–36.0)
MCV: 92.7 fL (ref 78.0–100.0)
Platelets: 311 10*3/uL (ref 150–400)
RBC: 3.3 MIL/uL — AB (ref 3.87–5.11)
RDW: 14.7 % (ref 11.5–15.5)
WBC: 7.6 10*3/uL (ref 4.0–10.5)

## 2016-04-05 LAB — BASIC METABOLIC PANEL
ANION GAP: 9 (ref 5–15)
BUN: 17 mg/dL (ref 6–20)
CALCIUM: 8.9 mg/dL (ref 8.9–10.3)
CO2: 18 mmol/L — AB (ref 22–32)
Chloride: 116 mmol/L — ABNORMAL HIGH (ref 101–111)
Creatinine, Ser: 0.88 mg/dL (ref 0.44–1.00)
GFR calc non Af Amer: 56 mL/min — ABNORMAL LOW (ref >60)
GLUCOSE: 107 mg/dL — AB (ref 65–99)
POTASSIUM: 4.5 mmol/L (ref 3.5–5.1)
Sodium: 143 mmol/L (ref 135–145)

## 2016-04-05 MED ORDER — ENSURE ENLIVE PO LIQD: 237.0000 mL | Freq: Two times a day (BID) | ORAL | Status: DC

## 2016-04-05 MED ORDER — LORAZEPAM 2 MG/ML IJ SOLN
0.5000 mg | Freq: Four times a day (QID) | INTRAMUSCULAR | Status: DC | PRN
  Filled 2016-04-05: qty 1

## 2016-04-05 NOTE — [place] FL2 (Signed)
[place] MEDICAID FL2 LEVEL OF CARE SCREENING TOOL     IDENTIFICATION  Patient Name: Elizabeth RoupVerna Locken Birthdate: 07/08/1923 Sex: female Admission Date (Current Location): 04/03/2016  [facility] [place]   Facility and Address:  [facility] [place], [address], [place], [place] [address]      Provider Number: [id number]  Attending Physician Name and Address:  Dorothea OgleIskra M Myers, MD  Relative Name and Phone Number:       Current Level of Care: Hospital Recommended Level of Care: Skilled Nursing Facility Prior Approval Number:    Date Approved/Denied:   PASRR Number: [id number]  Discharge Plan: Home    Current Diagnoses: Patient Active Problem List   Diagnosis Date Noted  . Pressure ulcer of unspecified buttock, stage 2 04/04/2016  . Generalized abdominal pain 04/04/2016  . Dyslipidemia associated with type 2 diabetes mellitus (HCC) 04/04/2016  . Hyperkalemia 04/03/2016  . Chronic kidney disease, stage III (moderate) 03/14/2016    Orientation RESPIRATION BLADDER Height & Weight     Self, Time, Situation, Place  Normal Incontinent Weight: 138 lb (62.6 kg) Height:  5\' 7"  (170.2 cm)  BEHAVIORAL SYMPTOMS/MOOD NEUROLOGICAL BOWEL NUTRITION STATUS      Continent Diet (See DC Summary)  AMBULATORY STATUS COMMUNICATION OF NEEDS Skin   Extensive Assist Verbally Normal                       Personal Care Assistance Level of Assistance  Bathing, Dressing Bathing Assistance: Maximum assistance   Dressing Assistance: Maximum assistance     Functional Limitations Info  Sight, Hearing, Speech Sight Info: Adequate Hearing Info: Adequate Speech Info: Adequate    SPECIAL CARE FACTORS FREQUENCY  PT (By licensed PT), OT (By licensed OT)     PT Frequency: 5x wk OT Frequency: 5x wk            Contractures Contractures Info: Not present    Additional Factors Info  Code Status Code Status Info: DNR Allergies Info: Dicyclomine,  Gabapentin, Metformin And Related, Lyrica Pregabalin, Topiramate Psychotropic Info: Cymbalta Insulin Sliding Scale Info: 6/day Isolation Precautions Info: droplet     Current Medications (04/05/2016):  This is the current hospital active medication list Current Facility-Administered Medications  Medication Dose Route Frequency Provider Last Rate Last Dose  . acetaminophen (TYLENOL) tablet 650 mg  650 mg Oral Q6H PRN Bobette Moavid Manuel Ortiz, MD      . albuterol (PROVENTIL) (2.5 MG/3ML) 0.083% nebulizer solution 2.5 mg  2.5 mg Nebulization Q6H PRN Bobette Moavid Manuel Ortiz, MD      . aspirin EC tablet 81 mg  81 mg Oral Daily Bobette Moavid Manuel Ortiz, MD   81 mg at 04/05/16 0915  . calcium carbonate (TUMS - dosed in mg elemental calcium) chewable tablet 200 mg of elemental calcium  1 tablet Oral Daily PRN Bobette Moavid Manuel Ortiz, MD      . clonazePAM Scarlette Calico(KLONOPIN) tablet 0.25 mg  0.25 mg Oral TID PRN Bobette Moavid Manuel Ortiz, MD      . glipiZIDE (GLUCOTROL) tablet 10 mg  10 mg Oral QAC breakfast Bobette Moavid Manuel Ortiz, MD      . glipiZIDE (GLUCOTROL) tablet 5 mg  5 mg Oral QAC supper Bobette Moavid Manuel Ortiz, MD      . megestrol (MEGACE) tablet 20 mg  20 mg Oral Daily Bobette Moavid Manuel Ortiz, MD   20 mg at 04/05/16 0916  . metFORMIN (GLUCOPHAGE) tablet 500 mg  500 mg Oral Q breakfast Onalee Huaavid  Rutha Bouchard, MD   500 mg at 04/05/16 0915  . mirtazapine (REMERON) tablet 30 mg  30 mg Oral QHS Bobette Mo, MD   30 mg at 04/04/16 2146  . NIFEdipine (PROCARDIA-XL/ADALAT-CC/NIFEDICAL-XL) 24 hr tablet 30 mg  30 mg Oral Daily Bobette Mo, MD   30 mg at 04/05/16 0916  . ondansetron (ZOFRAN) tablet 4 mg  4 mg Oral Q6H PRN Bobette Mo, MD       Or  . ondansetron [facility]) injection 4 mg  4 mg Intravenous Q6H PRN Bobette Mo, MD      . pantoprazole (PROTONIX) EC tablet 40 mg  40 mg Oral Daily Bobette Mo, MD   40 mg at 04/05/16 0915  . polyethylene glycol (MIRALAX / GLYCOLAX) packet 17 g  17 g Oral Daily Bobette Mo, MD    17 g at 04/05/16 0915  . simvastatin (ZOCOR) tablet 40 mg  40 mg Oral QPM Bobette Mo, MD   40 mg at 04/04/16 2146  . torsemide (DEMADEX) tablet 5 mg  5 mg Oral Daily Bobette Mo, MD   5 mg at 04/05/16 1610     Discharge Medications: Please see discharge summary for a list of discharge medications.  Relevant Imaging Results:  Relevant Lab Results:   Additional Information [id number]  Kem Kays B, LCSWA
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
## 2016-04-05 NOTE — Clinical Social Work Placement (Signed)
   CLINICAL SOCIAL WORK PLACEMENT  NOTE  Date:  04/05/2016  Patient Details  Name: Elizabeth Keller MRN: [id number] Date of Birth: 07-07-1923  Clinical Social Work is seeking post-discharge placement for this patient at the Skilled  Nursing Facility level of care (*CSW will initial, date and re-position this form in  chart as items are completed):  Yes   Patient/family provided with No Name Clinical Social Work Department's list of facilities offering this level of care within the geographic area requested by the patient (or if unable, by the patient's family).  Yes   Patient/family informed of their freedom to choose among providers that offer the needed level of care, that participate in Medicare, Medicaid or managed care program needed by the patient, have an available bed and are willing to accept the patient.  Yes   Patient/family informed of [facility]'s ownership interest in [facility] and [facility], as well as of the fact that they are under no obligation to receive care at these facilities.  PASRR submitted to EDS on 03/17/16     PASRR number received on 03/17/16     Existing PASRR number confirmed on 04/05/16     FL2 transmitted to all facilities in geographic area requested by pt/family on 04/05/16     FL2 transmitted to all facilities within larger geographic area on       Patient informed that his/her managed care company has contracts with or will negotiate with certain facilities, including the following:            Patient/family informed of bed offers received.  Patient chooses bed at       Physician recommends and patient chooses bed at      Patient to be transferred to   on  .  Patient to be transferred to facility by       Patient family notified on   of transfer.  Name of family member notified:        PHYSICIAN Please sign FL2     Additional Comment:    _______________________________________________ Norlene DuelBROWN, Deniya Craigo B,  LCSWA 04/05/2016, 1:51 PM

## 2016-04-06 ENCOUNTER — Telehealth: Payer: Self-pay | Admitting: Family

## 2016-04-06 DIAGNOSIS — E875 Hyperkalemia: Principal | ICD-10-CM

## 2016-04-06 LAB — BASIC METABOLIC PANEL
Anion gap: 12 (ref 5–15)
BUN: 13 mg/dL (ref 6–20)
CO2: 19 mmol/L — AB (ref 22–32)
Calcium: 9.2 mg/dL (ref 8.9–10.3)
Chloride: 111 mmol/L (ref 101–111)
Creatinine, Ser: 0.94 mg/dL (ref 0.44–1.00)
GFR calc Af Amer: >60 mL/min (ref >60)
GFR, EST NON AFRICAN AMERICAN: 52 mL/min — AB (ref >60)
GLUCOSE: 113 mg/dL — AB (ref 65–99)
Potassium: 4.6 mmol/L (ref 3.5–5.1)
Sodium: 142 mmol/L (ref 135–145)

## 2016-04-06 LAB — CBC
HCT: 32.1 % — ABNORMAL LOW (ref 36.0–46.0)
Hemoglobin: 10.3 g/dL — ABNORMAL LOW (ref 12.0–15.0)
MCH: 30 pg (ref 26.0–34.0)
MCHC: 32.1 g/dL (ref 30.0–36.0)
MCV: 93.6 fL (ref 78.0–100.0)
PLATELETS: 326 10*3/uL (ref 150–400)
RBC: 3.43 MIL/uL — AB (ref 3.87–5.11)
RDW: 14.5 % (ref 11.5–15.5)
WBC: 8.1 10*3/uL (ref 4.0–10.5)

## 2016-04-06 LAB — GLUCOSE, CAPILLARY
Glucose-Capillary: 107 mg/dL — ABNORMAL HIGH (ref 65–99)
Glucose-Capillary: 112 mg/dL — ABNORMAL HIGH (ref 65–99)

## 2016-04-06 NOTE — Progress Notes (Addendum)
Physical Therapy Treatment Patient Details Name: Elizabeth Keller MRN: [id number] DOB: 11-Jan-1924 Today's Date: 04/06/2016    History of Present Illness Pt is a 83 yo female admitted through ED on 04/03/16 for Hyperkalemia, CKDIII, Abdominal Pain and abnormal chest pain. PMH signficant for HTN, HLD, DM 2, and sarcoidosis.     PT Comments    Pt slow to progress.  Emphasis on sitting and standing balance/tolerance.    Follow Up Recommendations  SNF;Supervision/Assistance - 24 hour (need HHPT since pt's family plans to take pt home.)     Equipment Recommendations  None recommended by PT    Recommendations for Other Services       Precautions / Restrictions Precautions Precautions: Fall    Mobility  Bed Mobility Overal bed mobility: Needs Assistance Bed Mobility: Supine to Sit     Supine to sit: Max assist;+2 for physical assistance     General bed mobility comments: HOB elevated, Used pad to "heicopter" pt to EOB, pt pivoting minimally on R UE  Transfers Overall transfer level: Needs assistance Equipment used: Rolling walker (2 wheeled);None Transfers: Sit to/from [facility] Sit to Stand: Mod assist;Max assist;+2 physical assistance Stand pivot transfers: Mod assist;+2 physical assistance       General transfer comment: Graded assist over 3 trials to get pt to stay in midline in stance in the RW  Ambulation/Gait                 Stairs            Wheelchair Mobility    Modified Rankin (Stroke Patients Only)       Balance Overall balance assessment: Needs assistance Sitting-balance support: Bilateral upper extremity supported;Feet supported;Single extremity supported Sitting balance-Leahy Scale: Poor Sitting balance - Comments: sat EOB 8-10 min working on upright sitting balance and orientation to midline.  After facilitation, pt able to sit for brief periods mostly R of midline, but could correct to the left. Postural control:  Right lateral lean Standing balance support: Bilateral upper extremity supported Standing balance-Leahy Scale: Poor Standing balance comment: needed facilitation to correct posture for upright with legs up under hear                    Cognition Arousal/Alertness: Awake/alert Behavior During Therapy: Anxious Overall Cognitive Status: History of cognitive impairments - at baseline               Problem Solving: Slow processing      Exercises      General Comments        Pertinent Vitals/Pain Pain Assessment: Faces Faces Pain Scale: Hurts little more Pain Location: LE's likely knees Pain Descriptors / Indicators: Discomfort;Grimacing Pain Intervention(s): Monitored during session;Repositioned;Limited activity within patient's tolerance    Home Living Family/patient expects to be discharged to:: Private residence Living Arrangements: Children Available Help at Discharge: Family;Available PRN/intermittently Type of Home: House Home Access: Stairs to enter Entrance Stairs-Rails: None Home Layout: One level Home Equipment: Environmental consultant - 4 wheels;Wheelchair - manual;Bedside commode      Prior Function Level of Independence: Needs assistance  Gait / Transfers Assistance Needed: pt has used the w/c to get from 1 place to another in the home lately ADL's / Homemaking Assistance Needed: pt has had to be assisted for basic ADL's lately     PT Goals (current goals can now be found in the care plan section) Progress towards PT goals: Goals met/education completed, patient discharged from PT    Frequency  Min 3X/week      PT Plan      Co-evaluation             End of Session   Activity Tolerance: Patient limited by fatigue Patient left: in chair;with call bell/phone within reach;with chair alarm set;with family/visitor present Nurse Communication: Mobility status PT Visit Diagnosis: Muscle weakness (generalized) (M62.81);Adult, failure to thrive  (R62.7)     Time: 0941-7919 PT Time Calculation (min) (ACUTE ONLY): 27 min  Charges:  $Therapeutic Activity: 23-37 mins                    G CodesTessie Fass Cattleya Dobratz 04/06/2016, 5:30 PM 04/06/2016  Donnella Sham, [place] [phone]  (pager)

## 2016-04-06 NOTE — Progress Notes (Signed)
To whomever it may concerns,    Ms Allayne StackSiscero [name]'s mother was admitted to medical hospital for medical condition. She has been helping the patient with her care and will also be helping with the transition with the care at  Home. If possible, please excuse her from work until 04/09/16.   Sincerely,  Stephania FragminAnkit Ciro Tashiro MD [facility].

## 2016-04-06 NOTE — Telephone Encounter (Signed)
Daughter called to follow up on forms that are being completed for patient. States she needs to pick them up tomorrow. Please call [phone] to let her know if the forms are completed.
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
## 2016-04-06 NOTE — Care Management Note (Signed)
Case Management Note  Patient Details  Name: Carlie Solorzano MRN: [id number] Date of Birth: 11-22-1923  Subjective/Objective:      CM following for progression and d/c planning.               Action/Plan: 04/06/2016 Met with pt and daughter, pt active with [facility] for [facility], PT, OT, aide and Education officer, museum. All services resumed and [facility] notified. Daughter requesting lift chair, heel boots, and allevyn pads. These items are not available thru the hospital. Discussed purchasing these items with the daughter and will offer an order for her use at the time of d/c to attempt to assist with purchase.   Expected Discharge Date:    04/06/2016              Expected Discharge Plan:  [place]  In-House Referral:  NA  Discharge planning Services  CM Consult  Post Acute Care Choice:  Home Health, Durable Medical Equipment Choice offered to:  Adult Children  DME Arranged:   (Lift Chair, heel boots, and Allevyn pads) DME Agency:  NA  HH Arranged:  RN, PT, OT, NA, Social Work [employer] Agency:  [place]  Status of Service:  Completed, signed off  If discussed at [employer], dates discussed:    Additional Comments:  Adron Bene, RN 04/06/2016, 11:28 AM

## 2016-04-06 NOTE — Telephone Encounter (Signed)
Please contact patient to arrange TCM follow-up. 

## 2016-04-06 NOTE — Progress Notes (Signed)
Patient being discharged to home with daughter POA. All instructions reviewed. All appointments reviewed. Educated and explained to daughter about discharge and equipment and meds. All belongings in tow. Patient safely left the unit in wheelchair.  Avelina LaineKimberly Hayzel Ruberg RN
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
## 2016-04-07 NOTE — Telephone Encounter (Signed)
Transition Care Management Follow-up Telephone Call  PCP: Lemont Fillers'SULLIVAN,MELISSA S., NP  Admit date: 04/03/2016 Discharge date: 04/06/2016  Admitted From: Home Disposition:  Home, refused SNF  Recommendations for Outpatient Follow-up:  1. Follow up with PCP in 1-2 weeks   Home Health: Yes Equipment/Devices: None  Discharge Condition:Stable   How have you been since you were released from the hospital? Per patient's daughter, "she's doing ok".   Do you understand why you were in the hospital? yes, per daughter, her mother's potassium level was up & have been informed to not take potassium tablets at this time.   Do you understand the discharge instructions? yes   Where were you discharged to? Home with daughter.   Items Reviewed:  Medications reviewed: yes  Allergies reviewed: yes  Dietary changes reviewed: yes, heart healthy diet  Referrals reviewed: yes, Follow up with PCP in 1-2 weeks   Functional Questionnaire:   Activities of Daily Living (ADLs):   She states they are independent in the following: None States they require assistance with the following: ambulation, bathing and hygiene, feeding, continence, grooming, toileting and dressing   Any transportation issues/concerns?: no   Any patient concerns? yes, per the daughter, her mother would like to get back to being independent with walking on her own.   Confirmed importance and date/time of follow-up visits scheduled yes, 04/17/16 at 2:00 PM.  Provider Appointment booked with Sandford CrazeMelissa O'Sullivan, NP.  Confirmed with patient if condition begins to worsen call PCP or go to the ER.  Patient was given the office number and encouraged to call back with question or concerns.  : yes

## 2016-04-07 NOTE — Telephone Encounter (Signed)
Per verbal from PCP, it appears employer gave pt's daughter the wrong FMLA forms. Notified [name] and asked her to have employer fax the FMLA for family member to my attention. She is requesting continuous leave from 03/19/16 through 05/09/16.  Awaiting correct forms.

## 2016-04-07 NOTE — Telephone Encounter (Signed)
Patient's daughter is calling regarding these forms. Please advise  Phone: [phone]

## 2016-04-07 NOTE — Telephone Encounter (Signed)
Per verbal from RN, Christen BameRonnie, pt's daughter was notified that forms are not complete yet. Just received them Friday. PCP is working on forms and this process can normally take 7 days to complete them. Advised her that we will call her once they have been completed.

## 2016-04-08 ENCOUNTER — Encounter: Payer: Self-pay | Admitting: Family

## 2016-04-08 ENCOUNTER — Telehealth: Payer: Self-pay | Admitting: Family

## 2016-04-08 NOTE — Telephone Encounter (Signed)
Barkley BrunsKristine a physical therapist with [facility] called stating that they were supposed to do the ressumption of home care services today but the family requested it be moved to next week. Lysbeth GalasFYI   Kristine Phone: [phone]

## 2016-04-08 NOTE — Telephone Encounter (Signed)
[facility] Ph# [phone]  Was going to go to home today. Daughter wants visit next week. They will be outside 48 hour window which requires them to dc pt from home care. It would require new referral. However pt needs beyond home care, recommending hospice/pallative care.

## 2016-04-08 NOTE — Telephone Encounter (Signed)
Noted. Please contact daughter and let her know that I would like to see the patient in the office on Friday to discuss goals of care.  We can address her order requests at the visit. OK to put two 30 minute slots together.

## 2016-04-09 NOTE — Telephone Encounter (Addendum)
Called patient's daughter, Louie CasaSiscero [name].  She explained that the reason she pushed back the visits was because she was scheduled to have surgery.  She is currently in the hospital and will not be able to bring mom to the office until next week.  Scheduled 30 min appt with Melissa for Tuesday, April 14, 2016.

## 2016-04-10 ENCOUNTER — Encounter: Payer: Self-pay | Admitting: Family

## 2016-04-13 ENCOUNTER — Encounter: Payer: Self-pay | Admitting: Family

## 2016-04-13 DIAGNOSIS — T148XXA Other injury of unspecified body region, initial encounter: Secondary | ICD-10-CM

## 2016-04-13 DIAGNOSIS — R238 Other skin changes: Principal | ICD-10-CM

## 2016-04-13 NOTE — Telephone Encounter (Signed)
[facility] in, he received a request from provider for a Gel Overlay. He said that form need to have NPI on it for insurance. He will fax over form for provider. Once received back [facility] will be able to provide.

## 2016-04-14 ENCOUNTER — Encounter: Payer: Self-pay | Admitting: Family

## 2016-04-14 ENCOUNTER — Ambulatory Visit (INDEPENDENT_AMBULATORY_CARE_PROVIDER_SITE_OTHER): Payer: Medicare Other | Admitting: Family

## 2016-04-14 VITALS — BP 95/58 | HR 107 | Resp 18 | Ht 67.0 in

## 2016-04-14 DIAGNOSIS — Z7189 Other specified counseling: Secondary | ICD-10-CM | POA: Diagnosis not present

## 2016-04-14 DIAGNOSIS — L97519 Non-pressure chronic ulcer of other part of right foot with unspecified severity: Secondary | ICD-10-CM

## 2016-04-14 DIAGNOSIS — L89152 Pressure ulcer of sacral region, stage 2: Secondary | ICD-10-CM | POA: Diagnosis not present

## 2016-04-14 DIAGNOSIS — L97529 Non-pressure chronic ulcer of other part of left foot with unspecified severity: Secondary | ICD-10-CM | POA: Diagnosis not present

## 2016-04-14 DIAGNOSIS — R630 Anorexia: Secondary | ICD-10-CM | POA: Diagnosis not present

## 2016-04-14 DIAGNOSIS — Z66 Do not resuscitate: Secondary | ICD-10-CM

## 2016-04-14 NOTE — Progress Notes (Signed)
Pre visit review using our clinic review tool, if applicable. No additional management support is needed unless otherwise documented below in the visit note. 

## 2016-04-14 NOTE — Telephone Encounter (Signed)
Still have not received correct FMLA forms from pt's daughter's employer. Notified [name] and she states her employer told her on Friday that they were going to mail the forms to the employee and she will bring them to us once she gets them. Awaiting form.

## 2016-04-14 NOTE — Progress Notes (Signed)
Subjective:    Patient ID: Elizabeth Keller, female    DOB: 08/12/1923, 83 y.o.   MRN: [id number]  HPI  Elizabeth Keller is a 83 yr old female who presents today for hospital follow up.  She was seen in our office for an establish care visit on 04/03/16 and was sent to the ED due to c/o severe abdominal pain. ED work up was revealing for hyperkalemia.  Her hyperkalemia was corrected.  SNF was recommended at time of discharge.  Family wished instead to bring the patient home. Family declined to let home health in to see the patient in the 48 hour window required per [facility] because her caregiver (daughter) was having surgery.  Per home health RN Victorino DikeJennifer from [facility],  and she told me that Elizabeth Keller has been discharged from their care because they feel that she is not appropriate for home care given her current needs and because they were not able to arrange a follow up with in 48 ours of her discharge per their policy.      Notes that her appetite is not great.  She tolerates good liquids.    Her daughter who accompanies her here today is her HCPOA.  Her grandson's wife is also with her.  The grandson helps with all of her transfers.  The family tells me that they want to try to keep her out of a facility.    Review of Systems See HPI  Past Medical History:  Diagnosis Date  . Arthritis   . Asthma   . Degenerative disk disease   . Diabetes mellitus   . GERD (gastroesophageal reflux disease)   . Hypercholesteremia   . Hypertension   . IBS (irritable bowel syndrome)   . Sarcoidosis (HCC)   . Spinal stenosis      Social History   Social History  . Marital status: Divorced    Spouse name: N/A  . Number of children: N/A  . Years of education: N/A   Occupational History  . Not on file.   Social History Main Topics  . Smoking status: Former Games developermoker  . Smokeless tobacco: Never Used  . Alcohol use No  . Drug use: No  . Sexual activity: Not on file   Other Topics Concern  . Not  on file   Social History Narrative  . No narrative on file    Past Surgical History:  Procedure Laterality Date  . EYE SURGERY  2017   cataract / glaucoma    Family History  Problem Relation Age of Onset  . Hypertension Other     Allergies  Allergen Reactions  . Dicyclomine Other (See Comments)    dizziness  . Gabapentin Other (See Comments)    dizziness  . Metformin And Related Nausea And Vomiting  . Lyrica [Pregabalin] Other (See Comments)  . Topiramate Other (See Comments)    Current Outpatient Prescriptions on File Prior to Visit  Medication Sig Dispense Refill  . albuterol (PROVENTIL HFA;VENTOLIN HFA) 108 (90 Base) MCG/ACT inhaler Inhale into the lungs every 6 (six) hours as needed for wheezing or shortness of breath.    Marland Kitchen. albuterol (PROVENTIL) (2.5 MG/3ML) 0.083% nebulizer solution Take 2.5 mg by nebulization every 6 (six) hours as needed for wheezing or shortness of breath.    Marland Kitchen. aspirin 81 MG tablet Take 81 mg by mouth daily.    . cholecalciferol (VITAMIN D) 1000 units tablet Take 1,000 Units by mouth daily.    . clonazePAM (  KLONOPIN) 0.5 MG tablet Take 0.25-0.5 mg by mouth 2 (two) times daily as needed. 1/2 tab q am; whole tab at 2pm; may take 1/2 tab between morning and 2pm dose, then delay 2pm dose till 7pm For anxiety    . diclofenac sodium (VOLTAREN) 1 % GEL Apply 2 g topically as needed.    Marland Kitchen glipiZIDE (GLUCOTROL) 10 MG tablet Take 5-10 mg by mouth daily before breakfast. Takes 1 tab in am and 1/2 tab in pm    . megestrol (MEGACE) 20 MG tablet Take 1 tablet (20 mg total) by mouth daily. 30 tablet 0  . metFORMIN (GLUCOPHAGE) 500 MG tablet Take 500 mg by mouth daily with breakfast.     . mirtazapine (REMERON) 30 MG tablet Take 30 mg by mouth at bedtime.    Marland Kitchen NIFEdipine (PROCARDIA-XL/ADALAT CC) 30 MG 24 hr tablet Take 30 mg by mouth daily.    Marland Kitchen omeprazole (PRILOSEC) 40 MG capsule Take 40 mg by mouth daily.     . Probiotic Product (PROBIOTIC FORMULA) CAPS Take 1  capsule by mouth daily.    . simvastatin (ZOCOR) 40 MG tablet Take 40 mg by mouth every evening.    . torsemide (DEMADEX) 5 MG tablet Take 5 mg by mouth daily.    . potassium chloride SA (K-DUR,KLOR-CON) 20 MEQ tablet Take 20 mEq by mouth daily.     No current facility-administered medications on file prior to visit.     BP (!) 95/58 (BP Location: Left Arm, Patient Position: Sitting, Cuff Size: Normal)   Pulse (!) 107   Resp 18   Ht 5\' 7"  (1.702 m)   SpO2 100%       Objective:   Physical Exam  Constitutional: She appears well-developed and well-nourished.  Cardiovascular: Normal rate, regular rhythm and normal heart sounds.   No murmur heard. Pulmonary/Chest: Effort normal and breath sounds normal. No respiratory distress. She has no wheezes.  Neurological: She is alert.  Confused but pleasant  Skin:  Bilateral heel ulcers- both have blisters which have drained and she has overlying skin from blister intact.  Several stage 2 ulcers noted on buttocks.   Psychiatric: She has a normal mood and affect. Her behavior is normal. Judgment and thought content normal.          Assessment & Plan:  Skin ulcers- Advised family as follows:  Apply Duoderm every 3 days to wounds on buttocks. Apply non-adherent dressing wrapped with guaze to both heels once daily.  Home health and the wound care clinic will have further recommendations for you on her wound care when you see them.   Anorexia- add glucerna.   Code status- daughter says that patient has a DNR at home. I have asked her to bring her paperwork next visit so we can update her medical record.  We discussed Home health and Hospice referral at her visit. Daughter wants to see if she will improve with PT and home health before bringing in hospice. Discussed with daughter that [facility] discharged the [facility] because they would not let them back in in 2 days post discharge.  [facility] has been contacted and they will  call them to arrange a visit.   1 hour spent with patient and family today.  >50% of this time was spent counseling patient/family on goals of care, skin care.

## 2016-04-14 NOTE — Patient Instructions (Addendum)
Apply Duoderm every 3 days to wounds on buttocks. Apply non-adherent dressing wrapped with guaze to both heels once daily.  Home health and the wound care clinic will have further recommendations for you on her wound care when you see them.  Add Glucerna one can three times daily. Reposition Elizabeth Keller every 90 minutes to help prevent further wounds from developing. Keep pressure off of her heels.   Bring copy of her DNR next visit for her records.

## 2016-04-15 ENCOUNTER — Telehealth: Payer: Self-pay | Admitting: Family

## 2016-04-15 NOTE — Telephone Encounter (Signed)
Melissa-- please advise? 

## 2016-04-15 NOTE — Telephone Encounter (Addendum)
Yes, after research, [facility] and [facility] would not be able to take patient on. [facility]

## 2016-04-15 NOTE — Telephone Encounter (Signed)
Will need referral placed

## 2016-04-15 NOTE — Telephone Encounter (Signed)
[facility] called in because she says that the pt want to receive services with them and not [facility]. She called in to request a referral.   She provided a direct fax # to intake [phone]

## 2016-04-15 NOTE — Telephone Encounter (Signed)
I placed referral on 3/5. She was already being seen [facility] but they dismissed her.  3/5 referral was intended for new home agency ([facility]).

## 2016-04-16 ENCOUNTER — Telehealth: Payer: Self-pay | Admitting: Family

## 2016-04-16 DIAGNOSIS — Z0279 Encounter for issue of other medical certificate: Secondary | ICD-10-CM

## 2016-04-16 NOTE — Telephone Encounter (Signed)
fmla paperwork dropped off for Melissa to fill out, documents placed in tray at front office

## 2016-04-17 ENCOUNTER — Encounter (HOSPITAL_BASED_OUTPATIENT_CLINIC_OR_DEPARTMENT_OTHER): Payer: Self-pay | Admitting: *Deleted

## 2016-04-17 ENCOUNTER — Emergency Department (HOSPITAL_BASED_OUTPATIENT_CLINIC_OR_DEPARTMENT_OTHER): Payer: Medicare Other

## 2016-04-17 ENCOUNTER — Telehealth: Payer: Self-pay | Admitting: *Deleted

## 2016-04-17 ENCOUNTER — Emergency Department (HOSPITAL_BASED_OUTPATIENT_CLINIC_OR_DEPARTMENT_OTHER)
Admission: EM | Admit: 2016-04-17 | Discharge: 2016-04-17 | Disposition: A | Discharge status: Home / Self Care | Payer: Medicare Other | Attending: Emergency Medicine | Admitting: Emergency Medicine

## 2016-04-17 ENCOUNTER — Inpatient Hospital Stay: Payer: Medicare Other | Admitting: Family

## 2016-04-17 DIAGNOSIS — M4800 Spinal stenosis, site unspecified: Secondary | ICD-10-CM | POA: Diagnosis not present

## 2016-04-17 DIAGNOSIS — Z79899 Other long term (current) drug therapy: Secondary | ICD-10-CM | POA: Diagnosis not present

## 2016-04-17 DIAGNOSIS — L89310 Pressure ulcer of right buttock, unstageable: Secondary | ICD-10-CM | POA: Diagnosis not present

## 2016-04-17 DIAGNOSIS — Z4800 Encounter for change or removal of nonsurgical wound dressing: Secondary | ICD-10-CM | POA: Diagnosis not present

## 2016-04-17 DIAGNOSIS — R531 Weakness: Secondary | ICD-10-CM | POA: Diagnosis present

## 2016-04-17 DIAGNOSIS — N183 Chronic kidney disease, stage 3 (moderate): Secondary | ICD-10-CM | POA: Insufficient documentation

## 2016-04-17 DIAGNOSIS — E860 Dehydration: Secondary | ICD-10-CM | POA: Insufficient documentation

## 2016-04-17 DIAGNOSIS — N289 Disorder of kidney and ureter, unspecified: Secondary | ICD-10-CM | POA: Diagnosis not present

## 2016-04-17 DIAGNOSIS — Z993 Dependence on wheelchair: Secondary | ICD-10-CM | POA: Diagnosis not present

## 2016-04-17 DIAGNOSIS — J45909 Unspecified asthma, uncomplicated: Secondary | ICD-10-CM | POA: Insufficient documentation

## 2016-04-17 DIAGNOSIS — L97428 Non-pressure chronic ulcer of left heel and midfoot with other specified severity: Secondary | ICD-10-CM | POA: Diagnosis not present

## 2016-04-17 DIAGNOSIS — Z7984 Long term (current) use of oral hypoglycemic drugs: Secondary | ICD-10-CM | POA: Insufficient documentation

## 2016-04-17 DIAGNOSIS — D869 Sarcoidosis, unspecified: Secondary | ICD-10-CM | POA: Diagnosis not present

## 2016-04-17 DIAGNOSIS — L97418 Non-pressure chronic ulcer of right heel and midfoot with other specified severity: Secondary | ICD-10-CM | POA: Diagnosis not present

## 2016-04-17 DIAGNOSIS — E1122 Type 2 diabetes mellitus with diabetic chronic kidney disease: Secondary | ICD-10-CM | POA: Diagnosis not present

## 2016-04-17 DIAGNOSIS — L89150 Pressure ulcer of sacral region, unstageable: Secondary | ICD-10-CM | POA: Diagnosis not present

## 2016-04-17 DIAGNOSIS — I129 Hypertensive chronic kidney disease with stage 1 through stage 4 chronic kidney disease, or unspecified chronic kidney disease: Secondary | ICD-10-CM | POA: Insufficient documentation

## 2016-04-17 DIAGNOSIS — Z9181 History of falling: Secondary | ICD-10-CM | POA: Diagnosis not present

## 2016-04-17 DIAGNOSIS — R238 Other skin changes: Principal | ICD-10-CM

## 2016-04-17 DIAGNOSIS — E11621 Type 2 diabetes mellitus with foot ulcer: Secondary | ICD-10-CM | POA: Diagnosis not present

## 2016-04-17 DIAGNOSIS — K589 Irritable bowel syndrome without diarrhea: Secondary | ICD-10-CM | POA: Diagnosis not present

## 2016-04-17 DIAGNOSIS — L97512 Non-pressure chronic ulcer of other part of right foot with fat layer exposed: Secondary | ICD-10-CM | POA: Diagnosis not present

## 2016-04-17 DIAGNOSIS — R918 Other nonspecific abnormal finding of lung field: Secondary | ICD-10-CM | POA: Diagnosis not present

## 2016-04-17 DIAGNOSIS — Z87891 Personal history of nicotine dependence: Secondary | ICD-10-CM | POA: Insufficient documentation

## 2016-04-17 DIAGNOSIS — M1990 Unspecified osteoarthritis, unspecified site: Secondary | ICD-10-CM | POA: Diagnosis not present

## 2016-04-17 DIAGNOSIS — Z7982 Long term (current) use of aspirin: Secondary | ICD-10-CM | POA: Diagnosis not present

## 2016-04-17 DIAGNOSIS — R031 Nonspecific low blood-pressure reading: Secondary | ICD-10-CM | POA: Diagnosis not present

## 2016-04-17 DIAGNOSIS — D631 Anemia in chronic kidney disease: Secondary | ICD-10-CM | POA: Diagnosis not present

## 2016-04-17 DIAGNOSIS — T148XXA Other injury of unspecified body region, initial encounter: Secondary | ICD-10-CM

## 2016-04-17 LAB — I-STAT CG4 LACTIC ACID, ED: LACTIC ACID, VENOUS: 1.18 mmol/L (ref 0.5–1.9)

## 2016-04-17 LAB — CBC WITH DIFFERENTIAL/PLATELET
BASOS ABS: 0 10*3/uL (ref 0.0–0.1)
Basophils Relative: 0 %
EOS PCT: 2 %
Eosinophils Absolute: 0.2 10*3/uL (ref 0.0–0.7)
HEMATOCRIT: 30.8 % — AB (ref 36.0–46.0)
HEMOGLOBIN: 10.3 g/dL — AB (ref 12.0–15.0)
LYMPHS ABS: 0.9 10*3/uL (ref 0.7–4.0)
LYMPHS PCT: 9 %
MCH: 30.3 pg (ref 26.0–34.0)
MCHC: 33.4 g/dL (ref 30.0–36.0)
MCV: 90.6 fL (ref 78.0–100.0)
Monocytes Absolute: 1 10*3/uL (ref 0.1–1.0)
Monocytes Relative: 10 %
NEUTROS PCT: 79 %
Neutro Abs: 7.7 10*3/uL (ref 1.7–7.7)
Platelets: 390 10*3/uL (ref 150–400)
RBC: 3.4 MIL/uL — AB (ref 3.87–5.11)
RDW: 14.9 % (ref 11.5–15.5)
WBC: 9.9 10*3/uL (ref 4.0–10.5)

## 2016-04-17 LAB — URINALYSIS, ROUTINE W REFLEX MICROSCOPIC
Glucose, UA: NEGATIVE mg/dL
Hgb urine dipstick: NEGATIVE
KETONES UR: NEGATIVE mg/dL
NITRITE: NEGATIVE
Protein, ur: NEGATIVE mg/dL
Specific Gravity, Urine: 1.022 (ref 1.005–1.030)
pH: 5 (ref 5.0–8.0)

## 2016-04-17 LAB — COMPREHENSIVE METABOLIC PANEL
ALT: 29 U/L (ref 14–54)
ANION GAP: 9 (ref 5–15)
AST: 44 U/L — ABNORMAL HIGH (ref 15–41)
Albumin: 2.5 g/dL — ABNORMAL LOW (ref 3.5–5.0)
Alkaline Phosphatase: 83 U/L (ref 38–126)
BILIRUBIN TOTAL: 0.4 mg/dL (ref 0.3–1.2)
BUN: 43 mg/dL — ABNORMAL HIGH (ref 6–20)
CHLORIDE: 109 mmol/L (ref 101–111)
CO2: 21 mmol/L — ABNORMAL LOW (ref 22–32)
Calcium: 8.7 mg/dL — ABNORMAL LOW (ref 8.9–10.3)
Creatinine, Ser: 1.96 mg/dL — ABNORMAL HIGH (ref 0.44–1.00)
GFR, EST AFRICAN AMERICAN: 25 mL/min — AB (ref >60)
GFR, EST NON AFRICAN AMERICAN: 21 mL/min — AB (ref >60)
Glucose, Bld: 231 mg/dL — ABNORMAL HIGH (ref 65–99)
POTASSIUM: 4.8 mmol/L (ref 3.5–5.1)
Sodium: 139 mmol/L (ref 135–145)
TOTAL PROTEIN: 6.4 g/dL — AB (ref 6.5–8.1)

## 2016-04-17 LAB — TROPONIN I: TROPONIN I: 0.05 ng/mL — AB (ref <0.03)

## 2016-04-17 LAB — URINALYSIS, MICROSCOPIC (REFLEX): RBC / HPF: NONE SEEN RBC/hpf (ref 0–5)

## 2016-04-17 LAB — OCCULT BLOOD X 1 CARD TO LAB, STOOL: FECAL OCCULT BLD: NEGATIVE

## 2016-04-17 MED ORDER — SODIUM CHLORIDE 0.9 % IV BOLUS (SEPSIS)
1000.0000 mL | Freq: Once | INTRAVENOUS | Status: AC
  Administered 2016-04-17: 1000 mL via INTRAVENOUS

## 2016-04-17 MED ORDER — SODIUM CHLORIDE 0.9 % IV BOLUS (SEPSIS)
500.0000 mL | Freq: Once | INTRAVENOUS | Status: AC
  Administered 2016-04-17: 500 mL via INTRAVENOUS

## 2016-04-17 MED ORDER — ACETAMINOPHEN 325 MG PO TABS
650.0000 mg | ORAL_TABLET | Freq: Once | ORAL | Status: AC
  Administered 2016-04-17: 650 mg via ORAL
  Filled 2016-04-17: qty 2

## 2016-04-17 NOTE — ED Notes (Signed)
Pt c/o all over bodyaches. Request tylenol for pain. Dr Lynelle DoctorKnapp aware.

## 2016-04-17 NOTE — Telephone Encounter (Signed)
Please notify patient's daughter re: palliative care referral.  I had spoken to her about hospice yesterday and she want to give PT a chance for a few weeks before hospice referral.  However, since home health can still come in with palliative care, will place referral to see if we can get them more equipment/more help while she works with home health. I did tell the daughter to have patient follow up with me in 3 weeks.  OK to sign off referral.

## 2016-04-17 NOTE — ED Notes (Signed)
I&O cath with 8 fr cath. Amber urine return. Pt tolerated well.

## 2016-04-17 NOTE — Discharge Instructions (Signed)
Stop taking her procardia.   Follow-up with home health and her doctors next week as planned, try to encourage fluids as we discussed.  Her renal function will need to be rechecked early next week.  Return to the ED for worsening symptoms, fever, chest pain, shortness of breath

## 2016-04-17 NOTE — Telephone Encounter (Signed)
Received call from Marchelle FolksAmanda, RN [facility]. Currently at pt's home and wanted to let us know that palliative care and home health can both see pt at the same time and we can go ahead place order for palliative care. States pt is requesting a hoyer lift and thinks palliative care will be able to assist with that. Wanted clarification of the boots that we ordered for pt. Per verbal from PCP, wants boots with heel pad that has fur lining. Requested order for gel cushion for her recliner and verbal order given. Also stated she did not have duoderm dressing for pressure sore but did have Aleaven foam with Silver if ok to substitute. Per verbal from PCP ok to use Aleaven. Reports that pt's BP was 72/40, Pulse 88 upon arrival. Notes that pt is alert, talking, active and family reports that she is hydrating well. Nurse rechecked BP and was 86/48. Per verbal from PCP, pt should go to the ER for additional hydration to see if they can get BP a little higher. I have pended palliative care order. Please advise dx?

## 2016-04-17 NOTE — ED Provider Notes (Signed)
[facility] Provider Note   CSN: [id number] Arrival date & time: 04/17/16  1212     History   Chief Complaint Chief Complaint  Patient presents with  . Hypotension    HPI Elizabeth Keller is a 83 y.o. female.  HPI Patient presents to the emergency room for evaluation of possible low blood pressure.  The patient is a 83 year old woman who lives at home with family. Patient was recently in the hospital in early of February.  According to the records since that time the patient's had a progressive decline in her health.  The patient was last seen in the emergency room on February 23.  She was admitted to the hospital for treatment of hyperkalemia. Patient had an x-ray that did not show evidence of pneumonia. She also had a CT scan of the abdomen without any acute findings. The patient was hydrated and her symptoms improved.  Nursing home placement was considered however the patient opted for home care. Home health nurse was checking on the patient today. According to the report they noticed a low blood pressure of 70/40. EMS was called. When EMS arrived they obtain a blood pressure of 101/56. Patient denies any acute complaints. She denies any chest pain or abdominal pain. She denies any vomiting or diarrhea. Past Medical History:  Diagnosis Date  . Arthritis   . Asthma   . Degenerative disk disease   . Diabetes mellitus   . GERD (gastroesophageal reflux disease)   . Hypercholesteremia   . Hypertension   . IBS (irritable bowel syndrome)   . Sarcoidosis (HCC)   . Spinal stenosis     Patient Active Problem List   Diagnosis Date Noted  . Pressure ulcer of unspecified buttock, stage 2 04/04/2016  . Generalized abdominal pain 04/04/2016  . Dyslipidemia associated with type 2 diabetes mellitus (HCC) 04/04/2016  . Hyperkalemia 04/03/2016  . Chronic kidney disease, stage III (moderate) 03/14/2016    Past Surgical History:  Procedure Laterality Date  . EYE SURGERY  2017   cataract / glaucoma    OB History    No data available       Home Medications    Prior to Admission medications   Medication Sig Start Date End Date Taking? Authorizing Provider  albuterol (PROVENTIL HFA;VENTOLIN HFA) 108 (90 Base) MCG/ACT inhaler Inhale into the lungs every 6 (six) hours as needed for wheezing or shortness of breath.    Historical Provider, MD  albuterol (PROVENTIL) (2.5 MG/3ML) 0.083% nebulizer solution Take 2.5 mg by nebulization every 6 (six) hours as needed for wheezing or shortness of breath.    Historical Provider, MD  aspirin 81 MG tablet Take 81 mg by mouth daily.    Historical Provider, MD  cholecalciferol (VITAMIN D) 1000 units tablet Take 1,000 Units by mouth daily.    Historical Provider, MD  clonazePAM (KLONOPIN) 0.5 MG tablet Take 0.25-0.5 mg by mouth 2 (two) times daily as needed. 1/2 tab q am; whole tab at 2pm; may take 1/2 tab between morning and 2pm dose, then delay 2pm dose till 7pm For anxiety    Historical Provider, MD  diclofenac sodium (VOLTAREN) 1 % GEL Apply 2 g topically as needed.    Historical Provider, MD  glipiZIDE (GLUCOTROL) 10 MG tablet Take 5-10 mg by mouth daily before breakfast. Takes 1 tab in am and 1/2 tab in pm    Historical Provider, MD  megestrol (MEGACE) 20 MG tablet Take 1 tablet (20 mg total) by mouth daily.  04/03/16   Sandford CrazeMelissa O'Sullivan, NP  metFORMIN (GLUCOPHAGE) 500 MG tablet Take 500 mg by mouth daily with breakfast.     Historical Provider, MD  mirtazapine (REMERON) 30 MG tablet Take 30 mg by mouth at bedtime.    Historical Provider, MD  NIFEdipine (PROCARDIA-XL/ADALAT CC) 30 MG 24 hr tablet Take 30 mg by mouth daily.    Historical Provider, MD  omeprazole (PRILOSEC) 40 MG capsule Take 40 mg by mouth daily.     Historical Provider, MD  potassium chloride SA (K-DUR,KLOR-CON) 20 MEQ tablet Take 20 mEq by mouth daily.    Historical Provider, MD  Probiotic Product (PROBIOTIC FORMULA) CAPS Take 1 capsule by mouth daily.     Historical Provider, MD  simvastatin (ZOCOR) 40 MG tablet Take 40 mg by mouth every evening.    Historical Provider, MD  torsemide (DEMADEX) 5 MG tablet Take 5 mg by mouth daily.    Historical Provider, MD    Family History Family History  Problem Relation Age of Onset  . Hypertension Other     Social History Social History  Substance Use Topics  . Smoking status: Former Games developermoker  . Smokeless tobacco: Never Used  . Alcohol use No     Allergies   Dicyclomine; Gabapentin; Metformin and related; Lyrica [pregabalin]; and Topiramate   Review of Systems Review of Systems  All other systems reviewed and are negative.    Physical Exam Updated Vital Signs BP 103/85   Pulse 86   Temp 97.6 F (36.4 C) (Oral)   Resp 17   Wt 61.2 kg   SpO2 96%   BMI 21.14 kg/m   Physical Exam  Constitutional: No distress.  Elderly, frail  HENT:  Head: Normocephalic and atraumatic.  Right Ear: External ear normal.  Left Ear: External ear normal.  Eyes: Conjunctivae are normal. Right eye exhibits no discharge. Left eye exhibits no discharge. No scleral icterus.  Neck: Neck supple. No tracheal deviation present.  Cardiovascular: Normal rate, regular rhythm and intact distal pulses.   Pulmonary/Chest: Effort normal and breath sounds normal. No stridor. No respiratory distress. She has no wheezes. She has no rales.  Abdominal: Soft. Bowel sounds are normal. She exhibits no distension. There is no tenderness. There is no rebound and no guarding.  Musculoskeletal: She exhibits no edema or tenderness.  Decubitus ulcers on both heels with evidence of some dark discolored tissue, pink tissue at the wound base, decubitus ulcer on the sacrum, the patient's diaper is filled with stool  Neurological: She is alert. No cranial nerve deficit (no facial droop, extraocular movements intact, no slurred speech) or sensory deficit. She exhibits normal muscle tone. She displays no seizure activity. Coordination  normal.  Generalized weakness all the patient will answer questions and move her extremities  Skin: Skin is warm and dry. No rash noted. She is not diaphoretic.  Psychiatric: She has a normal mood and affect.  Nursing note and vitals reviewed.    ED Treatments / Results  Labs (all labs ordered are listed, but only abnormal results are displayed) Labs Reviewed  COMPREHENSIVE METABOLIC PANEL - Abnormal; Notable for the following:       Result Value   CO2 21 (*)    Glucose, Bld 231 (*)    BUN 43 (*)    Creatinine, Ser 1.96 (*)    Calcium 8.7 (*)    Total Protein 6.4 (*)    Albumin 2.5 (*)    AST 44 (*)    GFR calc  non Af Amer 21 (*)    GFR calc Af Amer 25 (*)    All other components within normal limits  CBC WITH DIFFERENTIAL/PLATELET - Abnormal; Notable for the following:    RBC 3.40 (*)    Hemoglobin 10.3 (*)    HCT 30.8 (*)    All other components within normal limits  URINALYSIS, ROUTINE W REFLEX MICROSCOPIC - Abnormal; Notable for the following:    Color, Urine AMBER (*)    APPearance CLOUDY (*)    Bilirubin Urine SMALL (*)    Leukocytes, UA TRACE (*)    All other components within normal limits  TROPONIN I - Abnormal; Notable for the following:    Troponin I 0.05 (*)    All other components within normal limits  URINALYSIS, MICROSCOPIC (REFLEX) - Abnormal; Notable for the following:    Bacteria, UA MANY (*)    Squamous Epithelial / LPF 0-5 (*)    All other components within normal limits  URINE CULTURE  OCCULT BLOOD X 1 CARD TO LAB, STOOL  TROPONIN I  I-STAT CG4 LACTIC ACID, ED    EKG  EKG Interpretation  Date/Time:  Friday April 17 2016 12:26:43 EST Ventricular Rate:  102 PR Interval:    QRS Duration: 145 QT Interval:  387 QTC Calculation: 505 R Axis:   -84 Text Interpretation:  Atrial fibrillation RBBB and LAFB Artifact in lead(s) I II III aVR aVL aVF V1 V2 V3 V4 V5 V6 and baseline wander in lead(s) I II aVR Confirmed by Laksh Hinners  MD-J, Leida Luton ([id number]) on  04/17/2016 12:31:48 PM       Radiology Dg Chest Port 1 View  Result Date: 04/17/2016 CLINICAL DATA:  Hypotension.  History of sarcoidosis. EXAM: PORTABLE CHEST 1 VIEW COMPARISON:  04/03/2016; 04/07/2016; 12/19/2015 ; 10/20/2014 FINDINGS: Grossly unchanged cardiac silhouette and mediastinal contours with atherosclerotic plaque when the thoracic aorta. Biapical interstitial thickening, architectural distortion and volume loss appears grossly unchanged and compatible provided history of sarcoidosis. No new discrete focal airspace opacities. No pleural effusion or pneumothorax. No evidence of edema. No acute osseus abnormalities. IMPRESSION: 1. No definite acute cardiopulmonary disease on this AP portable examination. Further evaluation with a PA and lateral chest radiograph may be obtained as clinically indicated. 2. Similar findings of bilateral upper lobe interstitial thickening, architectural distortion and volume loss compatible provided history of sarcoidosis. Electronically Signed   By: Simonne Come M.D.   On: 04/17/2016 13:12    Procedures Procedures (including critical care time)  Medications Ordered in ED Medications  sodium chloride 0.9 % bolus 500 mL (0 mLs Intravenous Stopped 04/17/16 1449)  acetaminophen (TYLENOL) tablet 650 mg (650 mg Oral Given 04/17/16 1431)  sodium chloride 0.9 % bolus 1,000 mL (1,000 mLs Intravenous New Bag/Given 04/17/16 1454)     Initial Impression / Assessment and Plan / ED Course  I have reviewed the triage vital signs and the nursing notes.  Pertinent labs & imaging results that were available during my care of the patient were reviewed by me and considered in my medical decision making (see chart for details).  Clinical Course as of Apr 17 1609  Fri Apr 17, 2016  1536 DIfficult to determine if pat has a uti.  Will send off culture.    [JK]  1536 Slight increase in troponin.  Pt denies any trouble with chest pain or shortness of breath.  Discussed findings  with family.   She has follow up appointment this week.  Would like to try  and avoid hospitalization if possible.  Lbs are reassuring with the exception of the troponin and increased creatinine.  Will give a fluid bolus hear and plan on repeating the troponin  [JK]    Clinical Course User Index [JK] Linwood Dibbles, MD    Reviewed findings with patient and family.  Overall pt seems to be declining recently.  She is bed ridden.  Decubitus ulcers noted on heels and sacrum.  Pt appears stable in the ED.  No hypotension.  NO signs of infection (doubt uTI based on UA).   Renal insufficiency noted.   Suspect related to poor po intake.  Family is trying hard to have her drink fluids.   DIscussed options of admission to the hospital for obsevervation.  Family would prefer to take her home.   Will check a second troponin.  As long as it is not increasing, will consider dc home after hydration in the ED. Close follow up with pcp early next week.   Pt does have home health checking on her.  Final Clinical Impressions(s) / ED Diagnoses   Final diagnoses:  Renal insufficiency  Dehydration    New Prescriptions New Prescriptions   No medications on file     Linwood Dibbles, MD 04/17/16 1611

## 2016-04-17 NOTE — ED Triage Notes (Signed)
Pt to room 14 by ems reporting hypotension, per home health nurse who was not present on ems arrival, family was told pt bp was 70/40. Last bp by ems 101/56. Pt is alert to her baseline, pleasantly confused.

## 2016-04-19 LAB — URINE CULTURE

## 2016-04-20 ENCOUNTER — Encounter (HOSPITAL_BASED_OUTPATIENT_CLINIC_OR_DEPARTMENT_OTHER): Payer: Medicare Other | Attending: Internal Medicine

## 2016-04-20 ENCOUNTER — Telehealth (HOSPITAL_BASED_OUTPATIENT_CLINIC_OR_DEPARTMENT_OTHER): Payer: Self-pay | Admitting: Emergency Medicine

## 2016-04-20 ENCOUNTER — Telehealth: Payer: Self-pay | Admitting: Emergency Medicine

## 2016-04-20 DIAGNOSIS — Z87891 Personal history of nicotine dependence: Secondary | ICD-10-CM | POA: Diagnosis not present

## 2016-04-20 DIAGNOSIS — N183 Chronic kidney disease, stage 3 (moderate): Secondary | ICD-10-CM | POA: Insufficient documentation

## 2016-04-20 DIAGNOSIS — L89610 Pressure ulcer of right heel, unstageable: Secondary | ICD-10-CM | POA: Diagnosis not present

## 2016-04-20 DIAGNOSIS — L89310 Pressure ulcer of right buttock, unstageable: Secondary | ICD-10-CM | POA: Insufficient documentation

## 2016-04-20 DIAGNOSIS — E11621 Type 2 diabetes mellitus with foot ulcer: Secondary | ICD-10-CM | POA: Diagnosis not present

## 2016-04-20 DIAGNOSIS — E43 Unspecified severe protein-calorie malnutrition: Secondary | ICD-10-CM | POA: Diagnosis not present

## 2016-04-20 DIAGNOSIS — S91104A Unspecified open wound of right lesser toe(s) without damage to nail, initial encounter: Secondary | ICD-10-CM | POA: Diagnosis not present

## 2016-04-20 DIAGNOSIS — L89152 Pressure ulcer of sacral region, stage 2: Secondary | ICD-10-CM | POA: Insufficient documentation

## 2016-04-20 DIAGNOSIS — E1140 Type 2 diabetes mellitus with diabetic neuropathy, unspecified: Secondary | ICD-10-CM | POA: Insufficient documentation

## 2016-04-20 DIAGNOSIS — I129 Hypertensive chronic kidney disease with stage 1 through stage 4 chronic kidney disease, or unspecified chronic kidney disease: Secondary | ICD-10-CM | POA: Diagnosis not present

## 2016-04-20 DIAGNOSIS — E1122 Type 2 diabetes mellitus with diabetic chronic kidney disease: Secondary | ICD-10-CM | POA: Insufficient documentation

## 2016-04-20 DIAGNOSIS — L89620 Pressure ulcer of left heel, unstageable: Secondary | ICD-10-CM | POA: Diagnosis not present

## 2016-04-20 DIAGNOSIS — Z7984 Long term (current) use of oral hypoglycemic drugs: Secondary | ICD-10-CM | POA: Diagnosis not present

## 2016-04-20 DIAGNOSIS — F0390 Unspecified dementia without behavioral disturbance: Secondary | ICD-10-CM | POA: Diagnosis not present

## 2016-04-20 DIAGNOSIS — L97418 Non-pressure chronic ulcer of right heel and midfoot with other specified severity: Secondary | ICD-10-CM | POA: Diagnosis not present

## 2016-04-20 DIAGNOSIS — L97512 Non-pressure chronic ulcer of other part of right foot with fat layer exposed: Secondary | ICD-10-CM | POA: Insufficient documentation

## 2016-04-20 DIAGNOSIS — L89150 Pressure ulcer of sacral region, unstageable: Secondary | ICD-10-CM | POA: Diagnosis not present

## 2016-04-20 DIAGNOSIS — L97428 Non-pressure chronic ulcer of left heel and midfoot with other specified severity: Secondary | ICD-10-CM | POA: Diagnosis not present

## 2016-04-20 NOTE — Telephone Encounter (Signed)
Angie / Ashlee--Pt called and states she dropped off FMLA forms 3 days ago to our front desk. Have either of you seen the new forms?

## 2016-04-20 NOTE — Telephone Encounter (Signed)
Post ED Visit - Positive Culture Follow-up: Successful Patient Follow-Up  Culture assessed and recommendations reviewed by: [x]  Enzo BiNathan Batchelder, Pharm.D. []  Celedonio MiyamotoJeremy Frens, Pharm.D., BCPS []  Garvin FilaMike Maccia, Pharm.D. []  Georgina PillionElizabeth Martin, Pharm.D., BCPS []  Morgan FarmMinh Pham, [place]arm.D., BCPS, AAHIVP []  Estella HuskMichelle Turner, Pharm.D., BCPS, AAHIVP []  Tennis Mustassie Stewart, Pharm.D. []  Sherle Poeob Vincent, [place]arm.D.  Positive urine culture  [x]  Patient discharged without antimicrobial prescription and treatment is now indicated []  Organism is resistant to prescribed ED discharge antimicrobial []  Patient with positive blood cultures  Changes discussed with ED provider: Joycie PeekBenjamin Cartner PA New antibiotic prescription symptom check, if + start Amoxicillin 500mg  po Bid x 7 days Attempting to contact family   Berle MullMiller, Starla Deller 04/20/2016, 3:37 PM

## 2016-04-20 NOTE — Progress Notes (Signed)
ED Antimicrobial Stewardship Positive Culture Follow Up   Ileana RoupVerna Fawbush is an 83 y.o. female who [facility] with a chief complaint of  Chief Complaint  Patient presents with  . Hypotension    Recent Results (from the past 720 hour(s))  Urine culture     Status: Abnormal   Collection Time: 04/17/16  2:15 PM  Result Value Ref Range Status   Specimen Description URINE, RANDOM  Final   Special Requests NONE  Final   Culture >=100,000 COLONIES/mL ENTEROCOCCUS FAECALIS (A)  Final   Report Status 04/19/2016 FINAL  Final   Organism ID, Bacteria ENTEROCOCCUS FAECALIS (A)  Final      Susceptibility   Enterococcus faecalis - MIC*    AMPICILLIN <=2 SENSITIVE Sensitive     LEVOFLOXACIN 1 SENSITIVE Sensitive     NITROFURANTOIN <=16 SENSITIVE Sensitive     VANCOMYCIN 1 SENSITIVE Sensitive     * >=100,000 COLONIES/mL ENTEROCOCCUS FAECALIS    Flow manager to call and see if patient reports any symptoms.   If symptomatic, give prescription for amoxicillin 500mg  PO BID x 7 days  ED Provider: Joycie PeekBenjamin Cartner PA-C   Armandina StammerBATCHELDER,Shayna Eblen J 04/20/2016, 9:25 AM Infectious Diseases Pharmacist Phone# [phone]

## 2016-04-21 DIAGNOSIS — E11621 Type 2 diabetes mellitus with foot ulcer: Secondary | ICD-10-CM | POA: Diagnosis not present

## 2016-04-21 DIAGNOSIS — L89150 Pressure ulcer of sacral region, unstageable: Secondary | ICD-10-CM | POA: Diagnosis not present

## 2016-04-21 DIAGNOSIS — L97512 Non-pressure chronic ulcer of other part of right foot with fat layer exposed: Secondary | ICD-10-CM | POA: Diagnosis not present

## 2016-04-21 DIAGNOSIS — L97418 Non-pressure chronic ulcer of right heel and midfoot with other specified severity: Secondary | ICD-10-CM | POA: Diagnosis not present

## 2016-04-21 DIAGNOSIS — L89310 Pressure ulcer of right buttock, unstageable: Secondary | ICD-10-CM | POA: Diagnosis not present

## 2016-04-21 DIAGNOSIS — L97428 Non-pressure chronic ulcer of left heel and midfoot with other specified severity: Secondary | ICD-10-CM | POA: Diagnosis not present

## 2016-04-21 NOTE — Telephone Encounter (Signed)
FMLA forms filled in as much as possible and forwarded to PCP for review and completion.

## 2016-04-21 NOTE — Telephone Encounter (Signed)
Was advised by PCP that FMLA forms were with Bridgett LarssonAshlee and PCP will complete them tomorrow. Notified [name] and she requests that we call her when it is ready to pick up tomorrow.

## 2016-04-21 NOTE — Telephone Encounter (Signed)
Notified pt's daughter, Allayne StackSiscero and they are agreeable to proceed with palliative care referral. Scheduled f/u with PCP for 05/12/16 at 10:30am. She also wanted to let us know that they were able to obtain the Rx from wound care (collagenase 90gm ointment) to use on pt's ulcers.

## 2016-04-21 NOTE — Telephone Encounter (Signed)
Left message for pt's daughter to return my call. See additional phone note re: FMLA forms.

## 2016-04-22 ENCOUNTER — Telehealth: Payer: Self-pay | Admitting: Family

## 2016-04-22 DIAGNOSIS — L97512 Non-pressure chronic ulcer of other part of right foot with fat layer exposed: Secondary | ICD-10-CM | POA: Diagnosis not present

## 2016-04-22 DIAGNOSIS — L89150 Pressure ulcer of sacral region, unstageable: Secondary | ICD-10-CM | POA: Diagnosis not present

## 2016-04-22 DIAGNOSIS — L97428 Non-pressure chronic ulcer of left heel and midfoot with other specified severity: Secondary | ICD-10-CM | POA: Diagnosis not present

## 2016-04-22 DIAGNOSIS — E11621 Type 2 diabetes mellitus with foot ulcer: Secondary | ICD-10-CM | POA: Diagnosis not present

## 2016-04-22 DIAGNOSIS — L97418 Non-pressure chronic ulcer of right heel and midfoot with other specified severity: Secondary | ICD-10-CM | POA: Diagnosis not present

## 2016-04-22 DIAGNOSIS — L89310 Pressure ulcer of right buttock, unstageable: Secondary | ICD-10-CM | POA: Diagnosis not present

## 2016-04-22 NOTE — Telephone Encounter (Signed)
Notified pt's daughter and placed originals at front desk for pick up. Copy placed in red folder.

## 2016-04-22 NOTE — Telephone Encounter (Signed)
Notified Gregary SignsSean ok to proceed with PT as requested below.

## 2016-04-22 NOTE — Telephone Encounter (Signed)
Noted and agree. 

## 2016-04-22 NOTE — Telephone Encounter (Signed)
Caller name: Gregary SignsSean Relationship to [facility]: [facility]  Can be reached: [phone] Pharmacy:  Reason for call: Saw patient for PT on Monday. Needs verbal order for PT 3 times a week for 2 week and 2 times a week for 2 weeks for balance training and transfer training. May leave VM

## 2016-04-22 NOTE — Telephone Encounter (Signed)
Form completed.

## 2016-04-23 DIAGNOSIS — L97418 Non-pressure chronic ulcer of right heel and midfoot with other specified severity: Secondary | ICD-10-CM | POA: Diagnosis not present

## 2016-04-23 DIAGNOSIS — L89310 Pressure ulcer of right buttock, unstageable: Secondary | ICD-10-CM | POA: Diagnosis not present

## 2016-04-23 DIAGNOSIS — L97512 Non-pressure chronic ulcer of other part of right foot with fat layer exposed: Secondary | ICD-10-CM | POA: Diagnosis not present

## 2016-04-23 DIAGNOSIS — L97428 Non-pressure chronic ulcer of left heel and midfoot with other specified severity: Secondary | ICD-10-CM | POA: Diagnosis not present

## 2016-04-23 DIAGNOSIS — E11621 Type 2 diabetes mellitus with foot ulcer: Secondary | ICD-10-CM | POA: Diagnosis not present

## 2016-04-23 DIAGNOSIS — L89150 Pressure ulcer of sacral region, unstageable: Secondary | ICD-10-CM | POA: Diagnosis not present

## 2016-04-24 ENCOUNTER — Telehealth: Payer: Self-pay | Admitting: *Deleted

## 2016-04-24 DIAGNOSIS — L97512 Non-pressure chronic ulcer of other part of right foot with fat layer exposed: Secondary | ICD-10-CM | POA: Diagnosis not present

## 2016-04-24 DIAGNOSIS — L97428 Non-pressure chronic ulcer of left heel and midfoot with other specified severity: Secondary | ICD-10-CM | POA: Diagnosis not present

## 2016-04-24 DIAGNOSIS — L97418 Non-pressure chronic ulcer of right heel and midfoot with other specified severity: Secondary | ICD-10-CM | POA: Diagnosis not present

## 2016-04-24 DIAGNOSIS — E11621 Type 2 diabetes mellitus with foot ulcer: Secondary | ICD-10-CM | POA: Diagnosis not present

## 2016-04-24 DIAGNOSIS — L89150 Pressure ulcer of sacral region, unstageable: Secondary | ICD-10-CM | POA: Diagnosis not present

## 2016-04-24 DIAGNOSIS — L89310 Pressure ulcer of right buttock, unstageable: Secondary | ICD-10-CM | POA: Diagnosis not present

## 2016-04-24 NOTE — Telephone Encounter (Signed)
Contacted by home health RN, Darra Lismanda Turner who is conducting home visit.  States urine has pungent odor and requests prescription of Amoxiciilin be called in.  Called Amox[facility] 500mg  PO BID x 7 [facility] [place] per instructions related to positive urine culture.

## 2016-04-27 ENCOUNTER — Telehealth: Payer: Self-pay | Admitting: Family

## 2016-04-27 DIAGNOSIS — E11621 Type 2 diabetes mellitus with foot ulcer: Secondary | ICD-10-CM | POA: Diagnosis not present

## 2016-04-27 DIAGNOSIS — L89150 Pressure ulcer of sacral region, unstageable: Secondary | ICD-10-CM | POA: Diagnosis not present

## 2016-04-27 DIAGNOSIS — L89310 Pressure ulcer of right buttock, unstageable: Secondary | ICD-10-CM | POA: Diagnosis not present

## 2016-04-27 DIAGNOSIS — L97512 Non-pressure chronic ulcer of other part of right foot with fat layer exposed: Secondary | ICD-10-CM | POA: Diagnosis not present

## 2016-04-27 DIAGNOSIS — L97418 Non-pressure chronic ulcer of right heel and midfoot with other specified severity: Secondary | ICD-10-CM | POA: Diagnosis not present

## 2016-04-27 DIAGNOSIS — L97428 Non-pressure chronic ulcer of left heel and midfoot with other specified severity: Secondary | ICD-10-CM | POA: Diagnosis not present

## 2016-04-27 NOTE — Telephone Encounter (Signed)
Spoke with Adela LankJacqueline from [facility] informed her it is okay to see pt this week.

## 2016-04-27 NOTE — Telephone Encounter (Signed)
Jaqueline - Medical social worker to [facility]    She says that she called in to request the okay to move pt's eval to this week instead of last week. She says that she wasn't feeling well and was unable to make it to pt's home.    CB: [phone]

## 2016-04-28 ENCOUNTER — Telehealth: Payer: Self-pay | Admitting: Family

## 2016-04-28 DIAGNOSIS — L89150 Pressure ulcer of sacral region, unstageable: Secondary | ICD-10-CM | POA: Diagnosis not present

## 2016-04-28 DIAGNOSIS — L89310 Pressure ulcer of right buttock, unstageable: Secondary | ICD-10-CM | POA: Diagnosis not present

## 2016-04-28 DIAGNOSIS — L97428 Non-pressure chronic ulcer of left heel and midfoot with other specified severity: Secondary | ICD-10-CM | POA: Diagnosis not present

## 2016-04-28 DIAGNOSIS — L97418 Non-pressure chronic ulcer of right heel and midfoot with other specified severity: Secondary | ICD-10-CM | POA: Diagnosis not present

## 2016-04-28 DIAGNOSIS — L97512 Non-pressure chronic ulcer of other part of right foot with fat layer exposed: Secondary | ICD-10-CM | POA: Diagnosis not present

## 2016-04-28 DIAGNOSIS — B351 Tinea unguium: Secondary | ICD-10-CM

## 2016-04-28 DIAGNOSIS — E11621 Type 2 diabetes mellitus with foot ulcer: Secondary | ICD-10-CM | POA: Diagnosis not present

## 2016-04-28 NOTE — Telephone Encounter (Signed)
Relation to pt: self  Call back number: [phone]   Reason for call:  Patient requesting a referral Podiatrist due to toenails being long, please advise

## 2016-04-29 ENCOUNTER — Telehealth: Payer: Self-pay | Admitting: Family

## 2016-04-29 DIAGNOSIS — L97512 Non-pressure chronic ulcer of other part of right foot with fat layer exposed: Secondary | ICD-10-CM | POA: Diagnosis not present

## 2016-04-29 DIAGNOSIS — L97428 Non-pressure chronic ulcer of left heel and midfoot with other specified severity: Secondary | ICD-10-CM | POA: Diagnosis not present

## 2016-04-29 DIAGNOSIS — L89310 Pressure ulcer of right buttock, unstageable: Secondary | ICD-10-CM | POA: Diagnosis not present

## 2016-04-29 DIAGNOSIS — E11621 Type 2 diabetes mellitus with foot ulcer: Secondary | ICD-10-CM | POA: Diagnosis not present

## 2016-04-29 DIAGNOSIS — L89150 Pressure ulcer of sacral region, unstageable: Secondary | ICD-10-CM | POA: Diagnosis not present

## 2016-04-29 DIAGNOSIS — L97418 Non-pressure chronic ulcer of right heel and midfoot with other specified severity: Secondary | ICD-10-CM | POA: Diagnosis not present

## 2016-04-29 NOTE — Telephone Encounter (Signed)
Caller name: Adela LankJacqueline Relationship to patient: [facility] Social Worker Can be reached: [phone] Pharmacy:  Reason for call: Request verbal orders to see patient 1 time the week of May 10, 2016

## 2016-04-29 NOTE — Telephone Encounter (Signed)
Left detailed message on below voicemail ok to proceed as below.

## 2016-05-01 DIAGNOSIS — L97512 Non-pressure chronic ulcer of other part of right foot with fat layer exposed: Secondary | ICD-10-CM | POA: Diagnosis not present

## 2016-05-01 DIAGNOSIS — L97412 Non-pressure chronic ulcer of right heel and midfoot with fat layer exposed: Secondary | ICD-10-CM | POA: Diagnosis not present

## 2016-05-01 DIAGNOSIS — E11621 Type 2 diabetes mellitus with foot ulcer: Secondary | ICD-10-CM | POA: Diagnosis not present

## 2016-05-01 DIAGNOSIS — L89620 Pressure ulcer of left heel, unstageable: Secondary | ICD-10-CM | POA: Diagnosis not present

## 2016-05-01 DIAGNOSIS — L89150 Pressure ulcer of sacral region, unstageable: Secondary | ICD-10-CM | POA: Diagnosis not present

## 2016-05-01 DIAGNOSIS — L97428 Non-pressure chronic ulcer of left heel and midfoot with other specified severity: Secondary | ICD-10-CM | POA: Diagnosis not present

## 2016-05-01 DIAGNOSIS — L89610 Pressure ulcer of right heel, unstageable: Secondary | ICD-10-CM | POA: Diagnosis not present

## 2016-05-01 DIAGNOSIS — L97418 Non-pressure chronic ulcer of right heel and midfoot with other specified severity: Secondary | ICD-10-CM | POA: Diagnosis not present

## 2016-05-01 DIAGNOSIS — L89310 Pressure ulcer of right buttock, unstageable: Secondary | ICD-10-CM | POA: Diagnosis not present

## 2016-05-01 DIAGNOSIS — L97422 Non-pressure chronic ulcer of left heel and midfoot with fat layer exposed: Secondary | ICD-10-CM | POA: Diagnosis not present

## 2016-05-01 DIAGNOSIS — L89152 Pressure ulcer of sacral region, stage 2: Secondary | ICD-10-CM | POA: Diagnosis not present

## 2016-05-04 DIAGNOSIS — L97418 Non-pressure chronic ulcer of right heel and midfoot with other specified severity: Secondary | ICD-10-CM | POA: Diagnosis not present

## 2016-05-04 DIAGNOSIS — E11621 Type 2 diabetes mellitus with foot ulcer: Secondary | ICD-10-CM | POA: Diagnosis not present

## 2016-05-04 DIAGNOSIS — L97428 Non-pressure chronic ulcer of left heel and midfoot with other specified severity: Secondary | ICD-10-CM | POA: Diagnosis not present

## 2016-05-04 DIAGNOSIS — L89310 Pressure ulcer of right buttock, unstageable: Secondary | ICD-10-CM | POA: Diagnosis not present

## 2016-05-04 DIAGNOSIS — L89150 Pressure ulcer of sacral region, unstageable: Secondary | ICD-10-CM | POA: Diagnosis not present

## 2016-05-04 DIAGNOSIS — L97512 Non-pressure chronic ulcer of other part of right foot with fat layer exposed: Secondary | ICD-10-CM | POA: Diagnosis not present

## 2016-05-05 DIAGNOSIS — L97512 Non-pressure chronic ulcer of other part of right foot with fat layer exposed: Secondary | ICD-10-CM | POA: Diagnosis not present

## 2016-05-05 DIAGNOSIS — E11621 Type 2 diabetes mellitus with foot ulcer: Secondary | ICD-10-CM | POA: Diagnosis not present

## 2016-05-05 DIAGNOSIS — L97428 Non-pressure chronic ulcer of left heel and midfoot with other specified severity: Secondary | ICD-10-CM | POA: Diagnosis not present

## 2016-05-05 DIAGNOSIS — L89310 Pressure ulcer of right buttock, unstageable: Secondary | ICD-10-CM | POA: Diagnosis not present

## 2016-05-05 DIAGNOSIS — L97418 Non-pressure chronic ulcer of right heel and midfoot with other specified severity: Secondary | ICD-10-CM | POA: Diagnosis not present

## 2016-05-05 DIAGNOSIS — L89150 Pressure ulcer of sacral region, unstageable: Secondary | ICD-10-CM | POA: Diagnosis not present

## 2016-05-06 DIAGNOSIS — E11621 Type 2 diabetes mellitus with foot ulcer: Secondary | ICD-10-CM | POA: Diagnosis not present

## 2016-05-06 DIAGNOSIS — L89310 Pressure ulcer of right buttock, unstageable: Secondary | ICD-10-CM | POA: Diagnosis not present

## 2016-05-06 DIAGNOSIS — L97512 Non-pressure chronic ulcer of other part of right foot with fat layer exposed: Secondary | ICD-10-CM | POA: Diagnosis not present

## 2016-05-06 DIAGNOSIS — L97418 Non-pressure chronic ulcer of right heel and midfoot with other specified severity: Secondary | ICD-10-CM | POA: Diagnosis not present

## 2016-05-06 DIAGNOSIS — L89150 Pressure ulcer of sacral region, unstageable: Secondary | ICD-10-CM | POA: Diagnosis not present

## 2016-05-06 DIAGNOSIS — L97428 Non-pressure chronic ulcer of left heel and midfoot with other specified severity: Secondary | ICD-10-CM | POA: Diagnosis not present

## 2016-05-07 ENCOUNTER — Telehealth: Payer: Self-pay | Admitting: Family

## 2016-05-07 NOTE — Telephone Encounter (Signed)
Pt's grandson Scheryl Marten(Katrice) dropped off document for pt's PCP to have on her chart. Document in a white envelope. Document put at front office tray.

## 2016-05-08 ENCOUNTER — Other Ambulatory Visit: Payer: Self-pay | Admitting: Family

## 2016-05-08 DIAGNOSIS — L97512 Non-pressure chronic ulcer of other part of right foot with fat layer exposed: Secondary | ICD-10-CM | POA: Diagnosis not present

## 2016-05-08 DIAGNOSIS — E11621 Type 2 diabetes mellitus with foot ulcer: Secondary | ICD-10-CM | POA: Diagnosis not present

## 2016-05-08 DIAGNOSIS — L89310 Pressure ulcer of right buttock, unstageable: Secondary | ICD-10-CM | POA: Diagnosis not present

## 2016-05-08 DIAGNOSIS — L97428 Non-pressure chronic ulcer of left heel and midfoot with other specified severity: Secondary | ICD-10-CM | POA: Diagnosis not present

## 2016-05-08 DIAGNOSIS — L89150 Pressure ulcer of sacral region, unstageable: Secondary | ICD-10-CM | POA: Diagnosis not present

## 2016-05-08 DIAGNOSIS — L97418 Non-pressure chronic ulcer of right heel and midfoot with other specified severity: Secondary | ICD-10-CM | POA: Diagnosis not present

## 2016-05-11 ENCOUNTER — Other Ambulatory Visit: Payer: Self-pay

## 2016-05-11 NOTE — Telephone Encounter (Signed)
Pt. Just established care on 04/03/16. Rx has not been filled by [place].Pt does not have UDS or CSC on file. Please Advise.

## 2016-05-11 NOTE — Telephone Encounter (Signed)
I spoke with pharmacy was told Clonazepam Rx that had 4 refills expired 10/18/15. Please Advise.

## 2016-05-11 NOTE — Telephone Encounter (Signed)
She should have more refills (4 more) according to [place] controlled substance database. TY.

## 2016-05-12 ENCOUNTER — Ambulatory Visit: Payer: Medicare Other | Admitting: Family

## 2016-05-12 ENCOUNTER — Ambulatory Visit (INDEPENDENT_AMBULATORY_CARE_PROVIDER_SITE_OTHER): Payer: Medicare Other | Admitting: Podiatry

## 2016-05-12 ENCOUNTER — Encounter: Payer: Self-pay | Admitting: Podiatry

## 2016-05-12 DIAGNOSIS — M79674 Pain in right toe(s): Secondary | ICD-10-CM

## 2016-05-12 DIAGNOSIS — L97512 Non-pressure chronic ulcer of other part of right foot with fat layer exposed: Secondary | ICD-10-CM | POA: Diagnosis not present

## 2016-05-12 DIAGNOSIS — M79675 Pain in left toe(s): Secondary | ICD-10-CM | POA: Diagnosis not present

## 2016-05-12 DIAGNOSIS — L89310 Pressure ulcer of right buttock, unstageable: Secondary | ICD-10-CM | POA: Diagnosis not present

## 2016-05-12 DIAGNOSIS — E11621 Type 2 diabetes mellitus with foot ulcer: Secondary | ICD-10-CM | POA: Diagnosis not present

## 2016-05-12 DIAGNOSIS — E1149 Type 2 diabetes mellitus with other diabetic neurological complication: Secondary | ICD-10-CM | POA: Diagnosis not present

## 2016-05-12 DIAGNOSIS — B351 Tinea unguium: Secondary | ICD-10-CM

## 2016-05-12 DIAGNOSIS — L97428 Non-pressure chronic ulcer of left heel and midfoot with other specified severity: Secondary | ICD-10-CM | POA: Diagnosis not present

## 2016-05-12 DIAGNOSIS — L89150 Pressure ulcer of sacral region, unstageable: Secondary | ICD-10-CM | POA: Diagnosis not present

## 2016-05-12 DIAGNOSIS — L97418 Non-pressure chronic ulcer of right heel and midfoot with other specified severity: Secondary | ICD-10-CM | POA: Diagnosis not present

## 2016-05-12 NOTE — Progress Notes (Signed)
   Subjective:    Patient ID: Elizabeth Keller, female    DOB: [birth year], 83 y.o.   MRN: [id number]  HPI  83 year old female presents the office with her family for concerns of thick, painful, elongated toenails that they cannot trim himself. Denies any redness or drainage the toenail size. She does have a wound on the right second toe as well as to her feet but she is actively going to the wound care center for treatment for this. He also home health nurse a comes the hospital change the bandages. Review of Systems  All other systems reviewed and are negative.      Objective:   Physical Exam NAD  Dermatological: Nails are hypertrophic, dystrophic, brittle, discolored, elongated 10. No surrounding redness or drainage. Tenderness nails 1-5 bilaterally. Small ulceration present on the right dorsal PIPJ. No probing to bone, undermining or tunneling. There is no drainage or pus or clinical signs of infection. There are bandages to both the feet to the rear foot they do not want have removed today to therefore unable to via with the remainder the wounds. No open lesions or pre-ulcerative lesions are identified today.   Vascular: DP pulse 1/4, PT pulse unable to palpate due to bandages. There is an immediate capillary refill time to all digits. There is no pain with calf compression, swelling, warmth, erythema.  Neruologic: Sensation decreased with Sims once the monofilament.  Musculoskeletal: Hammertoes are present. No pain, crepitus, or limitation noted with foot and ankle range of motion bilateral. Muscular strength 5/5 in all groups tested bilateral.      Assessment & Plan:  83 year old female symptomatic onychomycosis with ulcerations  -Treatment options discussed including all alternatives, risks, and complications -Etiology of symptoms were discussed -Nails debrided 10 without complications or bleeding. -Continue to follow up with wound care center for the wounds on both of her feet.  Discussed that if they need at some point wish spigot discharge in the wound care center they can follow-up with me for this as well. -Daily foot inspection -Follow-up in 3 months or sooner if any problems arise. In the meantime, encouraged to call the office with any questions, concerns, change in symptoms.   Ovid Curd, DPM

## 2016-05-14 DIAGNOSIS — I129 Hypertensive chronic kidney disease with stage 1 through stage 4 chronic kidney disease, or unspecified chronic kidney disease: Secondary | ICD-10-CM | POA: Diagnosis not present

## 2016-05-14 DIAGNOSIS — N183 Chronic kidney disease, stage 3 (moderate): Secondary | ICD-10-CM | POA: Diagnosis not present

## 2016-05-14 DIAGNOSIS — D869 Sarcoidosis, unspecified: Secondary | ICD-10-CM | POA: Diagnosis not present

## 2016-05-14 DIAGNOSIS — Z515 Encounter for palliative care: Secondary | ICD-10-CM | POA: Diagnosis not present

## 2016-05-14 DIAGNOSIS — L89150 Pressure ulcer of sacral region, unstageable: Secondary | ICD-10-CM | POA: Diagnosis not present

## 2016-05-15 ENCOUNTER — Emergency Department (HOSPITAL_COMMUNITY): Payer: Medicare Other

## 2016-05-15 ENCOUNTER — Inpatient Hospital Stay (HOSPITAL_COMMUNITY)
Admission: EM | Admit: 2016-05-15 | Discharge: 2016-05-20 | DRG: 853 | Disposition: A | Discharge status: Home Health | Payer: Medicare Other | Attending: Internal Medicine | Admitting: Internal Medicine

## 2016-05-15 ENCOUNTER — Encounter (HOSPITAL_COMMUNITY): Payer: Self-pay

## 2016-05-15 ENCOUNTER — Encounter (HOSPITAL_BASED_OUTPATIENT_CLINIC_OR_DEPARTMENT_OTHER): Payer: Medicare Other | Attending: Internal Medicine

## 2016-05-15 DIAGNOSIS — L98499 Non-pressure chronic ulcer of skin of other sites with unspecified severity: Secondary | ICD-10-CM | POA: Diagnosis not present

## 2016-05-15 DIAGNOSIS — R6520 Severe sepsis without septic shock: Secondary | ICD-10-CM | POA: Diagnosis not present

## 2016-05-15 DIAGNOSIS — L89150 Pressure ulcer of sacral region, unstageable: Secondary | ICD-10-CM | POA: Diagnosis present

## 2016-05-15 DIAGNOSIS — A419 Sepsis, unspecified organism: Principal | ICD-10-CM

## 2016-05-15 DIAGNOSIS — I129 Hypertensive chronic kidney disease with stage 1 through stage 4 chronic kidney disease, or unspecified chronic kidney disease: Secondary | ICD-10-CM | POA: Diagnosis present

## 2016-05-15 DIAGNOSIS — R627 Adult failure to thrive: Secondary | ICD-10-CM | POA: Diagnosis present

## 2016-05-15 DIAGNOSIS — E876 Hypokalemia: Secondary | ICD-10-CM | POA: Diagnosis not present

## 2016-05-15 DIAGNOSIS — I959 Hypotension, unspecified: Secondary | ICD-10-CM | POA: Diagnosis not present

## 2016-05-15 DIAGNOSIS — IMO0002 Reserved for concepts with insufficient information to code with codable children: Secondary | ICD-10-CM

## 2016-05-15 DIAGNOSIS — K589 Irritable bowel syndrome without diarrhea: Secondary | ICD-10-CM | POA: Diagnosis present

## 2016-05-15 DIAGNOSIS — L89221 Pressure ulcer of left hip, stage 1: Secondary | ICD-10-CM | POA: Diagnosis present

## 2016-05-15 DIAGNOSIS — L89302 Pressure ulcer of unspecified buttock, stage 2: Secondary | ICD-10-CM | POA: Diagnosis present

## 2016-05-15 DIAGNOSIS — F0390 Unspecified dementia without behavioral disturbance: Secondary | ICD-10-CM | POA: Diagnosis present

## 2016-05-15 DIAGNOSIS — L89610 Pressure ulcer of right heel, unstageable: Secondary | ICD-10-CM | POA: Insufficient documentation

## 2016-05-15 DIAGNOSIS — L89159 Pressure ulcer of sacral region, unspecified stage: Secondary | ICD-10-CM

## 2016-05-15 DIAGNOSIS — R918 Other nonspecific abnormal finding of lung field: Secondary | ICD-10-CM | POA: Diagnosis not present

## 2016-05-15 DIAGNOSIS — F0280 Dementia in other diseases classified elsewhere without behavioral disturbance: Secondary | ICD-10-CM | POA: Insufficient documentation

## 2016-05-15 DIAGNOSIS — E1122 Type 2 diabetes mellitus with diabetic chronic kidney disease: Secondary | ICD-10-CM | POA: Diagnosis present

## 2016-05-15 DIAGNOSIS — Z7984 Long term (current) use of oral hypoglycemic drugs: Secondary | ICD-10-CM

## 2016-05-15 DIAGNOSIS — Z87891 Personal history of nicotine dependence: Secondary | ICD-10-CM

## 2016-05-15 DIAGNOSIS — Z66 Do not resuscitate: Secondary | ICD-10-CM | POA: Diagnosis present

## 2016-05-15 DIAGNOSIS — R6521 Severe sepsis with septic shock: Secondary | ICD-10-CM

## 2016-05-15 DIAGNOSIS — N183 Chronic kidney disease, stage 3 unspecified: Secondary | ICD-10-CM | POA: Diagnosis present

## 2016-05-15 DIAGNOSIS — R339 Retention of urine, unspecified: Secondary | ICD-10-CM | POA: Diagnosis present

## 2016-05-15 DIAGNOSIS — L89620 Pressure ulcer of left heel, unstageable: Secondary | ICD-10-CM | POA: Diagnosis present

## 2016-05-15 DIAGNOSIS — E118 Type 2 diabetes mellitus with unspecified complications: Secondary | ICD-10-CM | POA: Diagnosis not present

## 2016-05-15 DIAGNOSIS — L89312 Pressure ulcer of right buttock, stage 2: Secondary | ICD-10-CM | POA: Diagnosis present

## 2016-05-15 DIAGNOSIS — D869 Sarcoidosis, unspecified: Secondary | ICD-10-CM | POA: Diagnosis present

## 2016-05-15 DIAGNOSIS — Z79899 Other long term (current) drug therapy: Secondary | ICD-10-CM

## 2016-05-15 DIAGNOSIS — E43 Unspecified severe protein-calorie malnutrition: Secondary | ICD-10-CM | POA: Insufficient documentation

## 2016-05-15 DIAGNOSIS — L89310 Pressure ulcer of right buttock, unstageable: Secondary | ICD-10-CM | POA: Insufficient documentation

## 2016-05-15 DIAGNOSIS — J45909 Unspecified asthma, uncomplicated: Secondary | ICD-10-CM | POA: Diagnosis present

## 2016-05-15 DIAGNOSIS — K219 Gastro-esophageal reflux disease without esophagitis: Secondary | ICD-10-CM | POA: Diagnosis present

## 2016-05-15 DIAGNOSIS — Z681 Body mass index (BMI) 19 or less, adult: Secondary | ICD-10-CM | POA: Diagnosis not present

## 2016-05-15 DIAGNOSIS — R338 Other retention of urine: Secondary | ICD-10-CM | POA: Diagnosis not present

## 2016-05-15 DIAGNOSIS — L97518 Non-pressure chronic ulcer of other part of right foot with other specified severity: Secondary | ICD-10-CM | POA: Insufficient documentation

## 2016-05-15 DIAGNOSIS — Z8249 Family history of ischemic heart disease and other diseases of the circulatory system: Secondary | ICD-10-CM

## 2016-05-15 DIAGNOSIS — Z888 Allergy status to other drugs, medicaments and biological substances status: Secondary | ICD-10-CM

## 2016-05-15 DIAGNOSIS — E7800 Pure hypercholesterolemia, unspecified: Secondary | ICD-10-CM | POA: Diagnosis present

## 2016-05-15 DIAGNOSIS — Z7982 Long term (current) use of aspirin: Secondary | ICD-10-CM

## 2016-05-15 DIAGNOSIS — D649 Anemia, unspecified: Secondary | ICD-10-CM | POA: Diagnosis present

## 2016-05-15 DIAGNOSIS — E1129 Type 2 diabetes mellitus with other diabetic kidney complication: Secondary | ICD-10-CM

## 2016-05-15 DIAGNOSIS — A4152 Sepsis due to Pseudomonas: Secondary | ICD-10-CM | POA: Diagnosis not present

## 2016-05-15 LAB — COMPREHENSIVE METABOLIC PANEL
ALBUMIN: 2.7 g/dL — AB (ref 3.5–5.0)
ALBUMIN: 2.4 g/dL — AB (ref 3.5–5.0)
ALK PHOS: 73 U/L (ref 38–126)
ALT: 12 U/L — ABNORMAL LOW (ref 14–54)
ALT: 12 U/L — ABNORMAL LOW (ref 14–54)
AST: 29 U/L (ref 15–41)
AST: 31 U/L (ref 15–41)
Alkaline Phosphatase: 64 U/L (ref 38–126)
Anion gap: 12 (ref 5–15)
Anion gap: 10 (ref 5–15)
BILIRUBIN TOTAL: 1 mg/dL (ref 0.3–1.2)
BUN: 24 mg/dL — AB (ref 6–20)
BUN: 21 mg/dL — AB (ref 6–20)
CALCIUM: 9.6 mg/dL (ref 8.9–10.3)
CHLORIDE: 111 mmol/L (ref 101–111)
CO2: 25 mmol/L (ref 22–32)
CO2: 20 mmol/L — ABNORMAL LOW (ref 22–32)
Calcium: 8.4 mg/dL — ABNORMAL LOW (ref 8.9–10.3)
Chloride: 106 mmol/L (ref 101–111)
Creatinine, Ser: 0.99 mg/dL (ref 0.44–1.00)
Creatinine, Ser: 0.83 mg/dL (ref 0.44–1.00)
GFR calc Af Amer: 56 mL/min — ABNORMAL LOW (ref >60)
GFR calc Af Amer: >60 mL/min (ref >60)
GFR calc non Af Amer: 49 mL/min — ABNORMAL LOW (ref >60)
GFR calc non Af Amer: >60 mL/min (ref >60)
GLUCOSE: 120 mg/dL — AB (ref 65–99)
GLUCOSE: 86 mg/dL (ref 65–99)
POTASSIUM: 4.5 mmol/L (ref 3.5–5.1)
Potassium: 4.2 mmol/L (ref 3.5–5.1)
SODIUM: 141 mmol/L (ref 135–145)
Sodium: 143 mmol/L (ref 135–145)
TOTAL PROTEIN: 6.8 g/dL (ref 6.5–8.1)
Total Bilirubin: 1.1 mg/dL (ref 0.3–1.2)
Total Protein: 6 g/dL — ABNORMAL LOW (ref 6.5–8.1)

## 2016-05-15 LAB — CBC WITH DIFFERENTIAL/PLATELET
BASOS ABS: 0.1 10*3/uL (ref 0.0–0.1)
BASOS ABS: 0 10*3/uL (ref 0.0–0.1)
BASOS PCT: 0 %
Basophils Relative: 0 %
EOS PCT: 2 %
EOS PCT: 2 %
Eosinophils Absolute: 0.3 10*3/uL (ref 0.0–0.7)
Eosinophils Absolute: 0.3 10*3/uL (ref 0.0–0.7)
HCT: 29.8 % — ABNORMAL LOW (ref 36.0–46.0)
HEMATOCRIT: 30.8 % — AB (ref 36.0–46.0)
Hemoglobin: 10.2 g/dL — ABNORMAL LOW (ref 12.0–15.0)
Hemoglobin: 9.8 g/dL — ABNORMAL LOW (ref 12.0–15.0)
LYMPHS PCT: 10 %
Lymphocytes Relative: 12 %
Lymphs Abs: 1.5 10*3/uL (ref 0.7–4.0)
Lymphs Abs: 1.4 10*3/uL (ref 0.7–4.0)
MCH: 29.7 pg (ref 26.0–34.0)
MCH: 29.5 pg (ref 26.0–34.0)
MCHC: 33.1 g/dL (ref 30.0–36.0)
MCHC: 32.9 g/dL (ref 30.0–36.0)
MCV: 89.8 fL (ref 78.0–100.0)
MCV: 89.8 fL (ref 78.0–100.0)
MONO ABS: 1.2 10*3/uL — AB (ref 0.1–1.0)
MONO ABS: 1 10*3/uL (ref 0.1–1.0)
MONOS PCT: 8 %
Monocytes Relative: 8 %
NEUTROS ABS: 11.4 10*3/uL — AB (ref 1.7–7.7)
Neutro Abs: 9.6 10*3/uL — ABNORMAL HIGH (ref 1.7–7.7)
Neutrophils Relative %: 80 %
Neutrophils Relative %: 78 %
PLATELETS: 415 10*3/uL — AB (ref 150–400)
PLATELETS: 350 10*3/uL (ref 150–400)
RBC: 3.43 MIL/uL — ABNORMAL LOW (ref 3.87–5.11)
RBC: 3.32 MIL/uL — ABNORMAL LOW (ref 3.87–5.11)
RDW: 15.7 % — AB (ref 11.5–15.5)
RDW: 15.8 % — AB (ref 11.5–15.5)
WBC: 14.3 10*3/uL — ABNORMAL HIGH (ref 4.0–10.5)
WBC: 12.2 10*3/uL — ABNORMAL HIGH (ref 4.0–10.5)

## 2016-05-15 LAB — SEDIMENTATION RATE: SED RATE: 132 mm/h — AB (ref 0–22)

## 2016-05-15 LAB — I-STAT CG4 LACTIC ACID, ED
Lactic Acid, Venous: 1.78 mmol/L (ref 0.5–1.9)
Lactic Acid, Venous: 4.04 mmol/L (ref 0.5–1.9)

## 2016-05-15 LAB — LACTIC ACID, PLASMA: LACTIC ACID, VENOUS: 1.3 mmol/L (ref 0.5–1.9)

## 2016-05-15 LAB — GLUCOSE, CAPILLARY: Glucose-Capillary: 111 mg/dL — ABNORMAL HIGH (ref 65–99)

## 2016-05-15 LAB — APTT: APTT: 21 s — AB (ref 24–36)

## 2016-05-15 LAB — TROPONIN I: Troponin I: <0.03 ng/mL (ref <0.03)

## 2016-05-15 LAB — PROTIME-INR
INR: 1.24
Prothrombin Time: 15.7 seconds — ABNORMAL HIGH (ref 11.4–15.2)

## 2016-05-15 MED ORDER — CLONAZEPAM 0.5 MG PO TABS
0.2500 mg | ORAL_TABLET | Freq: Two times a day (BID) | ORAL | Status: DC | PRN
  Administered 2016-05-15 – 2016-05-19 (×5): 0.25 mg via ORAL
  Filled 2016-05-15 (×6): qty 1

## 2016-05-15 MED ORDER — TORSEMIDE 5 MG PO TABS
5.0000 mg | ORAL_TABLET | Freq: Every day | ORAL | Status: DC
  Administered 2016-05-16 – 2016-05-20 (×5): 5 mg via ORAL
  Filled 2016-05-15 (×5): qty 1

## 2016-05-15 MED ORDER — MIRTAZAPINE 15 MG PO TABS
30.0000 mg | ORAL_TABLET | Freq: Every day | ORAL | Status: DC
  Administered 2016-05-15 – 2016-05-19 (×3): 30 mg via ORAL
  Filled 2016-05-15 (×3): qty 2

## 2016-05-15 MED ORDER — PIPERACILLIN-TAZOBACTAM 3.375 G IVPB
3.3750 g | Freq: Three times a day (TID) | INTRAVENOUS | Status: DC
  Administered 2016-05-16 – 2016-05-19 (×11): 3.375 g via INTRAVENOUS
  Filled 2016-05-15 (×12): qty 50

## 2016-05-15 MED ORDER — ALBUTEROL SULFATE (2.5 MG/3ML) 0.083% IN NEBU
2.5000 mg | INHALATION_SOLUTION | Freq: Four times a day (QID) | RESPIRATORY_TRACT | Status: DC | PRN
  Administered 2016-05-16 – 2016-05-18 (×2): 2.5 mg via RESPIRATORY_TRACT
  Filled 2016-05-15: qty 3

## 2016-05-15 MED ORDER — HYDROCODONE-ACETAMINOPHEN 5-325 MG PO TABS
1.0000 | ORAL_TABLET | ORAL | Status: DC | PRN
  Administered 2016-05-16 – 2016-05-18 (×4): 1 via ORAL
  Administered 2016-05-19 (×2): 2 via ORAL
  Administered 2016-05-20: 1 via ORAL
  Filled 2016-05-15: qty 1
  Filled 2016-05-15: qty 2
  Filled 2016-05-15: qty 1
  Filled 2016-05-15: qty 2
  Filled 2016-05-15 (×2): qty 1
  Filled 2016-05-15: qty 2

## 2016-05-15 MED ORDER — VANCOMYCIN HCL IN DEXTROSE 750-5 MG/150ML-% IV SOLN: 750.0000 mg | INTRAVENOUS | Status: DC

## 2016-05-15 MED ORDER — SODIUM CHLORIDE 0.9 % IV BOLUS (SEPSIS)
1000.0000 mL | Freq: Once | INTRAVENOUS | Status: AC
  Administered 2016-05-15: 1000 mL via INTRAVENOUS

## 2016-05-15 MED ORDER — VANCOMYCIN HCL IN DEXTROSE 750-5 MG/150ML-% IV SOLN
750.0000 mg | INTRAVENOUS | Status: DC
  Administered 2016-05-16 – 2016-05-18 (×3): 750 mg via INTRAVENOUS
  Filled 2016-05-15 (×4): qty 150

## 2016-05-15 MED ORDER — INSULIN GLARGINE 100 UNIT/ML ~~LOC~~ SOLN
5.0000 [IU] | Freq: Every day | SUBCUTANEOUS | Status: DC
  Administered 2016-05-15 – 2016-05-17 (×3): 5 [IU] via SUBCUTANEOUS
  Filled 2016-05-15 (×4): qty 0.05

## 2016-05-15 MED ORDER — ALBUTEROL SULFATE (2.5 MG/3ML) 0.083% IN NEBU
3.0000 mL | INHALATION_SOLUTION | Freq: Four times a day (QID) | RESPIRATORY_TRACT | Status: DC | PRN
  Filled 2016-05-15: qty 3

## 2016-05-15 MED ORDER — ACETAMINOPHEN 325 MG PO TABS
650.0000 mg | ORAL_TABLET | Freq: Four times a day (QID) | ORAL | Status: DC | PRN
  Administered 2016-05-15 – 2016-05-18 (×3): 650 mg via ORAL
  Filled 2016-05-15 (×3): qty 2

## 2016-05-15 MED ORDER — RISAQUAD PO CAPS
1.0000 | ORAL_CAPSULE | Freq: Every day | ORAL | Status: DC
  Administered 2016-05-16 – 2016-05-20 (×5): 1 via ORAL
  Filled 2016-05-15 (×5): qty 1

## 2016-05-15 MED ORDER — VANCOMYCIN HCL IN DEXTROSE 1-5 GM/200ML-% IV SOLN
1000.0000 mg | Freq: Once | INTRAVENOUS | Status: AC
  Administered 2016-05-15: 1000 mg via INTRAVENOUS
  Filled 2016-05-15: qty 200

## 2016-05-15 MED ORDER — VITAMIN D3 25 MCG (1000 UNIT) PO TABS
1000.0000 [IU] | ORAL_TABLET | Freq: Every day | ORAL | Status: DC
  Administered 2016-05-16 – 2016-05-20 (×5): 1000 [IU] via ORAL
  Filled 2016-05-15 (×5): qty 1

## 2016-05-15 MED ORDER — SIMVASTATIN 40 MG PO TABS
40.0000 mg | ORAL_TABLET | Freq: Every day | ORAL | Status: DC
  Administered 2016-05-15 – 2016-05-19 (×5): 40 mg via ORAL
  Filled 2016-05-15 (×5): qty 1

## 2016-05-15 MED ORDER — PIPERACILLIN-TAZOBACTAM 3.375 G IVPB 30 MIN
3.3750 g | Freq: Once | INTRAVENOUS | Status: AC
  Administered 2016-05-15: 3.375 g via INTRAVENOUS
  Filled 2016-05-15: qty 50

## 2016-05-15 MED ORDER — PIPERACILLIN-TAZOBACTAM 3.375 G IVPB
3.3750 g | Freq: Three times a day (TID) | INTRAVENOUS | Status: DC
  Filled 2016-05-15: qty 50

## 2016-05-15 MED ORDER — ONDANSETRON HCL 4 MG PO TABS: 4.0000 mg | ORAL_TABLET | Freq: Four times a day (QID) | ORAL | Status: DC | PRN

## 2016-05-15 MED ORDER — BISACODYL 10 MG RE SUPP: 10.0000 mg | Freq: Every day | RECTAL | Status: DC | PRN

## 2016-05-15 MED ORDER — HEPARIN SODIUM (PORCINE) 5000 UNIT/ML IJ SOLN
5000.0000 [IU] | Freq: Three times a day (TID) | INTRAMUSCULAR | Status: DC
Start: 2016-05-15 — End: 2016-05-20
  Administered 2016-05-15 – 2016-05-20 (×15): 5000 [IU] via SUBCUTANEOUS
  Filled 2016-05-15 (×15): qty 1

## 2016-05-15 MED ORDER — ONDANSETRON HCL 4 MG/2ML IJ SOLN: 4.0000 mg | Freq: Four times a day (QID) | INTRAMUSCULAR | Status: DC | PRN

## 2016-05-15 MED ORDER — PANTOPRAZOLE SODIUM 40 MG PO TBEC
40.0000 mg | DELAYED_RELEASE_TABLET | Freq: Every day | ORAL | Status: DC
  Administered 2016-05-16 – 2016-05-20 (×5): 40 mg via ORAL
  Filled 2016-05-15 (×5): qty 1

## 2016-05-15 MED ORDER — SODIUM CHLORIDE 0.9 % IV SOLN
INTRAVENOUS | Status: DC
  Administered 2016-05-15 – 2016-05-17 (×3): via INTRAVENOUS

## 2016-05-15 MED ORDER — COLLAGENASE 250 UNIT/GM EX OINT
1.0000 "application " | TOPICAL_OINTMENT | Freq: Every day | CUTANEOUS | Status: DC
  Administered 2016-05-16: 1 via TOPICAL
  Filled 2016-05-15: qty 30

## 2016-05-15 MED ORDER — ACETAMINOPHEN 650 MG RE SUPP: 650.0000 mg | Freq: Four times a day (QID) | RECTAL | Status: DC | PRN

## 2016-05-15 MED ORDER — INSULIN ASPART 100 UNIT/ML ~~LOC~~ SOLN
0.0000 [IU] | Freq: Three times a day (TID) | SUBCUTANEOUS | Status: DC
  Administered 2016-05-16 – 2016-05-20 (×3): 1 [IU] via SUBCUTANEOUS

## 2016-05-15 NOTE — Progress Notes (Signed)
Pharmacy Antibiotic Note  Elizabeth Keller is a 83 y.o. female admitted on 05/15/2016 with sepsis.  Pharmacy has been consulted for vancomycin and zosyn dosing. Patient presents from wound center with hypotension.  No fevers noted, but HR is elevated.  3/9 UCx growing amp-sensitive enterococcus but no pyuria on UA  Plan:  Vancomycin 1gm x 1 in ED then  IV q24h  Check trough as steady state if remains on vancomycin   Zosyn 3.375gm IV x 1 in ED then 3.375gm IV q8h over 4h infusion  Daily SCr as data suggest increased risk of nephrotoxicity with above antibiotic combo   De-escalate when appropriate  Height:  (170.2 cm) Weight: 135 lb (61.2 kg) IBW/kg (Calculated) : 61.6  Temp (24hrs), Avg:97.5 F (36.4 C), Min:97.5 F (36.4 C), Max:97.5 F (36.4 C)  No results for input(s): WBC, CREATININE, LATICACIDVEN, VANCOTROUGH, VANCOPEAK, VANCORANDOM, GENTTROUGH, GENTPEAK, GENTRANDOM, TOBRATROUGH, TOBRAPEAK, TOBRARND, AMIKACINPEAK, AMIKACINTROU, AMIKACIN in the last 168 hours.  CrCl cannot be calculated (Patient's most recent lab result is older than the maximum 21 days allowed.).    Allergies  Allergen Reactions  . Dicyclomine Other (See Comments)    dizziness  . Gabapentin Other (See Comments)    dizziness  . Metformin And Related Nausea And Vomiting  . Lyrica [Pregabalin] Other (See Comments)  . Topiramate Other (See Comments)    Antimicrobials this admission: 4/6 Vancomycin >> 4/6 zosyn >>  Dose adjustments this admission:   Microbiology results: 3/9: UCx: enterococcus faecalis: amp susc, vanco susc 4/6 BCx:  Thank you for allowing pharmacy to be a part of this patient's care.  Juliette Alcide, PharmD, BCPS.   Pager: [phone] 05/15/2016 3:28 PM

## 2016-05-15 NOTE — ED Notes (Signed)
ED Provider at bedside. 

## 2016-05-15 NOTE — ED Notes (Signed)
Lactic acid= 4.04, MD Steinl notified

## 2016-05-15 NOTE — ED Notes (Addendum)
Patient stuck 6 times without being able to get second blood culture. Belenda Cruise with Main lab notified about lab draw.

## 2016-05-15 NOTE — ED Triage Notes (Signed)
Patient was at the Wound Care Center and was noted to have hypotension. Patient ws brought in by the daughter.

## 2016-05-15 NOTE — ED Notes (Signed)
Stuck patient for IV access, patient yelling to stop sticking her and pulling away with needle in her arm.

## 2016-05-15 NOTE — ED Notes (Signed)
1st set of blood cultures drawn

## 2016-05-15 NOTE — ED Notes (Signed)
Hospitalist at bedside 

## 2016-05-15 NOTE — ED Notes (Signed)
Attempted to get an IV 2x with no success

## 2016-05-15 NOTE — H&P (Signed)
History and Physical    Elizabeth Keller XNT:[id number] DOB: October 19, 1923 DOA: 05/15/2016  Referring MD/NP/PA: EDP PCP: Nance Pear., NP   Outpatient Specialists: Wound Care center Patient coming from: Home  Chief Complaint: Hypotension  HPI: Elizabeth Keller is a 83 y.o. female with medical history significant for but not limited to dementia and chronic healing sacral decubitus ulcers who was transferred from the wound care center to [facility] ED on account of hypotension. Patient denies any history of fever but admits to chills. No history of cough noted by family. No other history available otherwise  ED Course: At the ED patient was hypotensive with BP of 73/50, heart rate 115/minutes but afebrile. Labs revealed leukocytosis. 12-lead EKG was negative for any acute ST-T wave changes in contiguous leads. She was given IV fluid boluses and IV antibiotics with vancomycin and Zosyn started  Review of Systems: Unable to obtain  Past Medical History:  Diagnosis Date  . Arthritis   . Asthma   . Degenerative disk disease   . Diabetes mellitus   . GERD (gastroesophageal reflux disease)   . Hypercholesteremia   . Hypertension   . IBS (irritable bowel syndrome)   . Sarcoidosis ([place])   . Spinal stenosis     Past Surgical History:  Procedure Laterality Date  . EYE SURGERY  2017   cataract / glaucoma     reports that she has quit smoking. She has never used smokeless tobacco. She reports that she does not drink alcohol or use drugs.  Allergies  Allergen Reactions  . Dicyclomine Other (See Comments)    Reaction:  Dizziness   . Gabapentin Other (See Comments)    Reaction:  Dizziness   . Metformin And Related Nausea And Vomiting  . Lyrica [Pregabalin] Other (See Comments)  . Topiramate Other (See Comments)    Family History  Problem Relation Age of Onset  . Hypertension Other      Prior to Admission medications   Medication Sig Start Date End Date Taking? Authorizing  Provider  albuterol (PROVENTIL HFA;VENTOLIN HFA) 108 (90 Base) MCG/ACT inhaler Inhale into the lungs every 6 (six) hours as needed for wheezing or shortness of breath.    Historical Provider, MD  albuterol (PROVENTIL) (2.5 MG/3ML) 0.083% nebulizer solution Take 2.5 mg by nebulization every 6 (six) hours as needed for wheezing or shortness of breath.    Historical Provider, MD  aspirin 81 MG tablet Take 81 mg by mouth daily.    Historical Provider, MD  cholecalciferol (VITAMIN D) 1000 units tablet Take 1,000 Units by mouth daily.    Historical Provider, MD  clonazePAM (KLONOPIN) 0.5 MG tablet TAKE 1/2 TABLET BY MOUTH DAILY IN THE MORNING, 1 TABLET AT 2PM, MAY TAKE 1/2 TABLET BETWEEN MORNING AND 2PM, THEN DELAY 2PM DOSE TO 7PM 05/11/16   Shelda Pal, DO  collagenase (SANTYL) ointment Apply 1 application topically daily. 90gm (250units)    Historical Provider, MD  diclofenac sodium (VOLTAREN) 1 % GEL Apply 2 g topically as needed.    Historical Provider, MD  glipiZIDE (GLUCOTROL) 10 MG tablet Take 5-10 mg by mouth daily before breakfast. Takes 1 tab in am and 1/2 tab in pm    Historical Provider, MD  megestrol (MEGACE) 20 MG tablet Take 1 tablet (20 mg total) by mouth daily. 04/03/16   Debbrah Alar, NP  metFORMIN (GLUCOPHAGE) 500 MG tablet Take 500 mg by mouth daily with breakfast.     Historical Provider, MD  mirtazapine (REMERON) 30 MG  tablet Take 30 mg by mouth at bedtime.    Historical Provider, MD  omeprazole (PRILOSEC) 40 MG capsule Take 40 mg by mouth daily.     Historical Provider, MD  potassium chloride SA (K-DUR,KLOR-CON) 20 MEQ tablet Take 20 mEq by mouth daily.    Historical Provider, MD  Probiotic Product (PROBIOTIC FORMULA) CAPS Take 1 capsule by mouth daily.    Historical Provider, MD  simvastatin (ZOCOR) 40 MG tablet Take 40 mg by mouth every evening.    Historical Provider, MD  torsemide (DEMADEX) 5 MG tablet Take 5 mg by mouth daily.    Historical Provider, MD     Physical Exam: Vitals:   05/15/16 1453 05/15/16 1515 05/15/16 1530 05/15/16 1545  BP:  (!) 87/64 101/62 101/75  Pulse:   (!) 110 (!) 112  Resp:  (!) 8 (!) 24 19  Temp:      TempSrc:      SpO2:   99% 100%  Weight: 61.2 kg (135 lb)     Height: _0  (1.702 m)         Constitutional: NAD, calm, comfortable Vitals:   05/15/16 1453 05/15/16 1515 05/15/16 1530 05/15/16 1545  BP:  (!) 87/64 101/62 101/75  Pulse:   (!) 110 (!) 112  Resp:  (!) 8 (!) 24 19  Temp:      TempSrc:      SpO2:   99% 100%  Weight: 61.2 kg (135 lb)     Height: _1  (1.702 m)      Eyes: PERRL, Mild conjunctival pallor(+) ENMT: Mucous membranes are moist. Posterior pharynx clear of any exudate or lesions.Normal dentition.  Neck: normal, supple, no masses, no thyromegaly Respiratory: clear to auscultation bilaterally, no wheezing, no crackles. Normal respiratory effort. No accessory muscle use.  Cardiovascular: Mildly tachycardic, regular rhythm, no murmurs / rubs / gallops. No extremity edema. 2+ pedal pulses. No carotid bruits.  Abdomen: no tenderness, no masses palpated. No hepatosplenomegaly. Bowel sounds positive.  Musculoskeletal: no clubbing / cyanosis.  Skin: sacrum-purulent sacral wound with devitalized tissues; Heels-bilateral diffuse ulcer of devitalized tissue, no purulence  Neurologic: Pleasantly confused, no acute focal deficit    Labs on Admission: I have personally reviewed following labs and imaging studies  CBC:  Recent Labs Lab 05/15/16 1552  WBC 14.3*  NEUTROABS 11.4*  HGB 10.2*  HCT 30.8*  MCV 89.8  PLT 203*   Basic Metabolic Panel:  Recent Labs Lab 05/15/16 1552  NA 143  K 4.2  CL 106  CO2 25  GLUCOSE 120*  BUN 24*  CREATININE 0.99  CALCIUM 9.6   GFR: Estimated Creatinine Clearance: 36.5 mL/min (by C-G formula based on SCr of 0.99 mg/dL). Liver Function Tests:  Recent Labs Lab 05/15/16 1552  AST 29  ALT 12*  ALKPHOS 73  BILITOT 1.0  PROT 6.8   ALBUMIN 2.7*   No results for input(s): LIPASE, AMYLASE in the last 168 hours. No results for input(s): AMMONIA in the last 168 hours. Coagulation Profile: No results for input(s): INR, PROTIME in the last 168 hours. Cardiac Enzymes: No results for input(s): CKTOTAL, CKMB, CKMBINDEX, TROPONINI in the last 168 hours. BNP (last 3 results) No results for input(s): PROBNP in the last 8760 hours. HbA1C: No results for input(s): HGBA1C in the last 72 hours. CBG: No results for input(s): GLUCAP in the last 168 hours. Lipid Profile: No results for input(s): CHOL, HDL, LDLCALC, TRIG, CHOLHDL, LDLDIRECT in the last 72 hours. Thyroid Function Tests: No  results for input(s): TSH, T4TOTAL, FREET4, T3FREE, THYROIDAB in the last 72 hours. Anemia Panel: No results for input(s): VITAMINB12, FOLATE, FERRITIN, TIBC, IRON, RETICCTPCT in the last 72 hours. Urine analysis:    Component Value Date/Time   COLORURINE AMBER (A) 04/17/2016 1415   APPEARANCEUR CLOUDY (A) 04/17/2016 1415   LABSPEC 1.022 04/17/2016 1415   PHURINE 5.0 04/17/2016 1415   GLUCOSEU NEGATIVE 04/17/2016 1415   HGBUR NEGATIVE 04/17/2016 1415   BILIRUBINUR SMALL (A) 04/17/2016 1415   KETONESUR NEGATIVE 04/17/2016 1415   PROTEINUR NEGATIVE 04/17/2016 1415   UROBILINOGEN 0.2 10/18/2014 2310   NITRITE NEGATIVE 04/17/2016 1415   LEUKOCYTESUR TRACE (A) 04/17/2016 1415   Sepsis Labs: _0 (procalcitonin:4,lacticidven:4) )No results found for this or any previous visit (from the past 240 hour(s)).   Radiological Exams on Admission: Dg Chest Port 1 View  Result Date: 05/15/2016 CLINICAL DATA:  83 year old female with a history of hypotension. EXAM: PORTABLE CHEST 1 VIEW COMPARISON:  04/18/2014 FINDINGS: Cardiomediastinal silhouette unchanged in size and contour. Calcifications of the aortic arch. Similar appearance of reticular pattern of opacity involving the bilateral lungs, more pronounced in the upper lungs above the hila.  Mixed nodular opacities. No new confluent airspace disease. No pleural effusion. No pneumothorax. pleuroparenchymal thickening at the apices. No displaced fracture. IMPRESSION: Overall there is a similar background pattern of architectural distortion and chronic lung changes/interstitial disease in this patient with a history of sarcoid. Although there is no clear evidence of new airspace disease, a superimposed acute process cannot be excluded. Aortic atherosclerosis. Electronically Signed   By: Corrie Mckusick D.O.   On: 05/15/2016 17:25    EKG: odered  Assessment/Plan Principal Problem:   Severe sepsis (HCC) Active Problems:   Chronic kidney disease, stage III (moderate)   Pressure ulcer of unspecified buttock, stage 2   DM2 (diabetes mellitus, type 2) ([place])   #1Severe Sepsis; Source unclear- ?? Wound Antibiotic IVF f/u blood/urine cultures Check ESR- condider x-ray to r/o osteo prn   #2 DM2: Hold metformin/Glipizide Basal Insulin with SSI for now  #3 Decubitus Ulcers: Sacral. Bilateral Heels Wound Care Pain Control Prn WOC Consult done  #4 CKD3: Monitor renal Function May have to D/C metformin if Cr >1.4 at discharge   DVT prophylaxis: Heparin Code Status: DNR Family Communication: Daughter and POA, Ecologist at [employer] Disposition Plan: To be determined Consults called:  Admission status: inpatient/ tele   OSEI-BONSU,Shunte Senseney MD [facility] Pager [phone]  If 7PM-7AM, please contact night-coverage [email or web address] Password [facility]  05/15/2016, 5:37 PM

## 2016-05-15 NOTE — ED Provider Notes (Addendum)
[facility]-EMERGENCY DEPT Provider Note   CSN: [id number] Arrival date & time: 05/15/16  1439     History   Chief Complaint No chief complaint on file.   HPI Elizabeth Keller is a 83 y.o. female.  Patient w hx dementia, chronic heel and sacral wounds, sarcoid, htn, presents from wound care center with low blood pressure and concern for infected wounds and sepsis.  Pt not verbally responsive to questions - level 5 caveat. No reported fevers.    The history is provided by the patient, a relative and medical records. The history is limited by the condition of the patient.    Past Medical History:  Diagnosis Date  . Arthritis   . Asthma   . Degenerative disk disease   . Diabetes mellitus   . GERD (gastroesophageal reflux disease)   . Hypercholesteremia   . Hypertension   . IBS (irritable bowel syndrome)   . Sarcoidosis (HCC)   . Spinal stenosis     Patient Active Problem List   Diagnosis Date Noted  . Pressure ulcer of unspecified buttock, stage 2 04/04/2016  . Generalized abdominal pain 04/04/2016  . Dyslipidemia associated with type 2 diabetes mellitus (HCC) 04/04/2016  . Hyperkalemia 04/03/2016  . Chronic kidney disease, stage III (moderate) 03/14/2016    Past Surgical History:  Procedure Laterality Date  . EYE SURGERY  2017   cataract / glaucoma    OB History    No data available       Home Medications    Prior to Admission medications   Medication Sig Start Date End Date Taking? Authorizing Provider  albuterol (PROVENTIL HFA;VENTOLIN HFA) 108 (90 Base) MCG/ACT inhaler Inhale into the lungs every 6 (six) hours as needed for wheezing or shortness of breath.    Historical Provider, MD  albuterol (PROVENTIL) (2.5 MG/3ML) 0.083% nebulizer solution Take 2.5 mg by nebulization every 6 (six) hours as needed for wheezing or shortness of breath.    Historical Provider, MD  aspirin 81 MG tablet Take 81 mg by mouth daily.    Historical Provider, MD  cholecalciferol (VITAMIN  D) 1000 units tablet Take 1,000 Units by mouth daily.    Historical Provider, MD  clonazePAM (KLONOPIN) 0.5 MG tablet TAKE 1/2 TABLET BY MOUTH DAILY IN THE MORNING, 1 TABLET AT 2PM, MAY TAKE 1/2 TABLET BETWEEN MORNING AND 2PM, THEN DELAY 2PM DOSE TO 7PM 05/11/16   Sharlene Dory, DO  collagenase (SANTYL) ointment Apply 1 application topically daily. 90gm (250units)    Historical Provider, MD  diclofenac sodium (VOLTAREN) 1 % GEL Apply 2 g topically as needed.    Historical Provider, MD  glipiZIDE (GLUCOTROL) 10 MG tablet Take 5-10 mg by mouth daily before breakfast. Takes 1 tab in am and 1/2 tab in pm    Historical Provider, MD  megestrol (MEGACE) 20 MG tablet Take 1 tablet (20 mg total) by mouth daily. 04/03/16   Sandford Craze, NP  metFORMIN (GLUCOPHAGE) 500 MG tablet Take 500 mg by mouth daily with breakfast.     Historical Provider, MD  mirtazapine (REMERON) 30 MG tablet Take 30 mg by mouth at bedtime.    Historical Provider, MD  omeprazole (PRILOSEC) 40 MG capsule Take 40 mg by mouth daily.     Historical Provider, MD  potassium chloride SA (K-DUR,KLOR-CON) 20 MEQ tablet Take 20 mEq by mouth daily.    Historical Provider, MD  Probiotic Product (PROBIOTIC FORMULA) CAPS Take 1 capsule by mouth daily.    Historical Provider, MD  simvastatin (ZOCOR) 40 MG tablet Take 40 mg by mouth every evening.    Historical Provider, MD  torsemide (DEMADEX) 5 MG tablet Take 5 mg by mouth daily.    Historical Provider, MD    Family History Family History  Problem Relation Age of Onset  . Hypertension Other     Social History Social History  Substance Use Topics  . Smoking status: Former Games developer  . Smokeless tobacco: Never Used  . Alcohol use No     Allergies   Dicyclomine; Gabapentin; Metformin and related; Lyrica [pregabalin]; and Topiramate   Review of Systems Review of Systems  Unable to perform ROS: Patient nonverbal  level 5 caveat, pt not responsive to questions   Physical  Exam Updated Vital Signs BP (!) 73/50 (BP Location: Right Arm)   Pulse (!) 115   Temp 97.5 F (36.4 C) (Oral)   Resp 16   Ht  (1.702 m)   Wt 61.2 kg   SpO2 93%   BMI 21.14 kg/m   Physical Exam  Constitutional:  Hypotensive. Tachycardic. Weak/frail appearing.   HENT:  Head: Atraumatic.  Mouth/Throat: Oropharynx is clear and moist.  Eyes: Conjunctivae are normal. Pupils are equal, round, and reactive to light. No scleral icterus.  Neck: Neck supple. No tracheal deviation present. No thyromegaly present.  No stiffness or rigidity  Cardiovascular: Regular rhythm, normal heart sounds and intact distal pulses.  Exam reveals no gallop and no friction rub.   No murmur heard. Pulmonary/Chest: Effort normal and breath sounds normal. No respiratory distress.  Abdominal: Soft. Normal appearance and bowel sounds are normal. She exhibits no distension. There is no tenderness.  Genitourinary:  Genitourinary Comments: No cva tenderness  Musculoskeletal: She exhibits no edema.  Large sacral wound, purulent, drainage, malodor, devitalized skin and soft tissue. bil heel wounds/chronic, no purulent drainage.   Neurological: She is alert.  Moves bil extremities purposefully w good strength.   Skin: Skin is warm and dry. No rash noted.  Psychiatric:  Alert, content.   Nursing note and vitals reviewed.    ED Treatments / Results  Labs (all labs ordered are listed, but only abnormal results are displayed) Results for orders placed or performed during the hospital encounter of 05/15/16  Comprehensive metabolic panel  Result Value Ref Range   Sodium 143 135 - 145 mmol/L   Potassium 4.2 3.5 - 5.1 mmol/L   Chloride 106 101 - 111 mmol/L   CO2 25 22 - 32 mmol/L   Glucose, Bld 120 (H) 65 - 99 mg/dL   BUN 24 (H) 6 - 20 mg/dL   Creatinine, Ser 9.60 0.44 - 1.00 mg/dL   Calcium 9.6 8.9 - 45.4 mg/dL   Total Protein 6.8 6.5 - 8.1 g/dL   Albumin 2.7 (L) 3.5 - 5.0 g/dL   AST 29 15 - 41 U/L    ALT 12 (L) 14 - 54 U/L   Alkaline Phosphatase 73 38 - 126 U/L   Total Bilirubin 1.0 0.3 - 1.2 mg/dL   GFR calc non Af Amer 49 (L) >60 mL/min   GFR calc Af Amer 56 (L) >60 mL/min   Anion gap 12 5 - 15  CBC WITH DIFFERENTIAL  Result Value Ref Range   WBC 14.3 (H) 4.0 - 10.5 K/uL   RBC 3.43 (L) 3.87 - 5.11 MIL/uL   Hemoglobin 10.2 (L) 12.0 - 15.0 g/dL   HCT 09.8 (L) 11.9 - 14.7 %   MCV 89.8 78.0 - 100.0 fL  MCH 29.7 26.0 - 34.0 pg   MCHC 33.1 30.0 - 36.0 g/dL   RDW 16.1 (H) 09.6 - 04.5 %   Platelets 415 (H) 150 - 400 K/uL   Neutrophils Relative % 80 %   Neutro Abs 11.4 (H) 1.7 - 7.7 K/uL   Lymphocytes Relative 10 %   Lymphs Abs 1.5 0.7 - 4.0 K/uL   Monocytes Relative 8 %   Monocytes Absolute 1.2 (H) 0.1 - 1.0 K/uL   Eosinophils Relative 2 %   Eosinophils Absolute 0.3 0.0 - 0.7 K/uL   Basophils Relative 0 %   Basophils Absolute 0.1 0.0 - 0.1 K/uL  I-Stat CG4 Lactic Acid, ED  (not at  [facility])  Result Value Ref Range   Lactic Acid, Venous 1.78 0.5 - 1.9 mmol/L   Dg Chest Port 1 View  Result Date: 04/17/2016 CLINICAL DATA:  Hypotension.  History of sarcoidosis. EXAM: PORTABLE CHEST 1 VIEW COMPARISON:  04/03/2016; 04/07/2016; 12/19/2015 ; 10/20/2014 FINDINGS: Grossly unchanged cardiac silhouette and mediastinal contours with atherosclerotic plaque when the thoracic aorta. Biapical interstitial thickening, architectural distortion and volume loss appears grossly unchanged and compatible provided history of sarcoidosis. No new discrete focal airspace opacities. No pleural effusion or pneumothorax. No evidence of edema. No acute osseus abnormalities. IMPRESSION: 1. No definite acute cardiopulmonary disease on this AP portable examination. Further evaluation with a PA and lateral chest radiograph may be obtained as clinically indicated. 2. Similar findings of bilateral upper lobe interstitial thickening, architectural distortion and volume loss compatible provided history of sarcoidosis.  Electronically Signed   By: Simonne Come M.D.   On: 04/17/2016 13:12    EKG  EKG Interpretation  Date/Time:  Friday May 15 2016 15:25:53 EDT Ventricular Rate:  110 PR Interval:    QRS Duration: 139 QT Interval:  383 QTC Calculation: 519 R Axis:   -104 Text Interpretation:  Sinus tachycardia Nonspecific IVCD with LAD Artifact in lead(s) II III aVR aVL aVF V1 V2 V3 V4 V5 V6 and baseline wander in lead(s) I Confirmed by Denton Lank  MD, Caryn Bee ([id number]) on 05/15/2016 4:37:52 PM       Radiology No results found.  Procedures Procedures (including critical care time)  Medications Ordered in ED Medications  piperacillin-tazobactam (ZOSYN) IVPB 3.375 g (not administered)  vancomycin (VANCOCIN) IVPB 1000 mg/200 mL premix (not administered)  sodium chloride 0.9 % bolus 1,000 mL (not administered)    And  sodium chloride 0.9 % bolus 1,000 mL (not administered)     Initial Impression / Assessment and Plan / ED Course  I have reviewed the triage vital signs and the nursing notes.  Pertinent labs & imaging results that were available during my care of the patient were reviewed by me and considered in my medical decision making (see chart for details).  Iv ns bolus - 30 cc/kg ns bolus.  Labs. Cultures.   Continuous pulse ox and monitor.   Iv abx.   Code sepsis intitiated.  Reviewed nursing notes and prior charts for additional history.   Post ns boluses, bp improved.  Recheck pt, bp improved otherwise no change in exam.   Pt has received iv abx.  CRITICAL CARE  RE sepsis with hypotension, infected sacral decubitus ulcer, weakness, failure to thrive.  Performed by: Suzi Roots Total critical care time: 40 minutes Critical care time was exclusive of separately billable procedures and treating other patients. Critical care was necessary to treat or prevent imminent or life-threatening deterioration. Critical care was time spent personally by  me on the following activities:  development of treatment plan with patient and/or surrogate as well as nursing, discussions with consultants, evaluation of patient's response to treatment, examination of patient, obtaining history from patient or surrogate, ordering and performing treatments and interventions, ordering and review of laboratory studies, ordering and review of radiographic studies, pulse oximetry and re-evaluation of patient's condition.   Final Clinical Impressions(s) / ED Diagnoses   Final diagnoses:  None    New Prescriptions New Prescriptions   No medications on file      Cathren Laine, MD 05/15/16 1656

## 2016-05-16 ENCOUNTER — Inpatient Hospital Stay (HOSPITAL_COMMUNITY): Payer: Medicare Other

## 2016-05-16 DIAGNOSIS — L89159 Pressure ulcer of sacral region, unspecified stage: Secondary | ICD-10-CM

## 2016-05-16 DIAGNOSIS — E118 Type 2 diabetes mellitus with unspecified complications: Secondary | ICD-10-CM

## 2016-05-16 DIAGNOSIS — N183 Chronic kidney disease, stage 3 (moderate): Secondary | ICD-10-CM

## 2016-05-16 LAB — COMPREHENSIVE METABOLIC PANEL
ALT: 11 U/L — ABNORMAL LOW (ref 14–54)
ANION GAP: 8 (ref 5–15)
AST: 24 U/L (ref 15–41)
Albumin: 2.2 g/dL — ABNORMAL LOW (ref 3.5–5.0)
Alkaline Phosphatase: 58 U/L (ref 38–126)
BILIRUBIN TOTAL: 0.9 mg/dL (ref 0.3–1.2)
BUN: 20 mg/dL (ref 6–20)
CO2: 23 mmol/L (ref 22–32)
Calcium: 8.3 mg/dL — ABNORMAL LOW (ref 8.9–10.3)
Chloride: 112 mmol/L — ABNORMAL HIGH (ref 101–111)
Creatinine, Ser: 0.8 mg/dL (ref 0.44–1.00)
GFR calc Af Amer: >60 mL/min (ref >60)
Glucose, Bld: 81 mg/dL (ref 65–99)
POTASSIUM: 3.5 mmol/L (ref 3.5–5.1)
Sodium: 143 mmol/L (ref 135–145)
TOTAL PROTEIN: 5.7 g/dL — AB (ref 6.5–8.1)

## 2016-05-16 LAB — CBC
HEMATOCRIT: 27.5 % — AB (ref 36.0–46.0)
HEMOGLOBIN: 9 g/dL — AB (ref 12.0–15.0)
MCH: 29.3 pg (ref 26.0–34.0)
MCHC: 32.7 g/dL (ref 30.0–36.0)
MCV: 89.6 fL (ref 78.0–100.0)
Platelets: 354 10*3/uL (ref 150–400)
RBC: 3.07 MIL/uL — AB (ref 3.87–5.11)
RDW: 15.7 % — ABNORMAL HIGH (ref 11.5–15.5)
WBC: 9.9 10*3/uL (ref 4.0–10.5)

## 2016-05-16 LAB — URINALYSIS, ROUTINE W REFLEX MICROSCOPIC
Bilirubin Urine: NEGATIVE
Glucose, UA: NEGATIVE mg/dL
Hgb urine dipstick: NEGATIVE
KETONES UR: NEGATIVE mg/dL
LEUKOCYTES UA: NEGATIVE
NITRITE: NEGATIVE
PH: 5 (ref 5.0–8.0)
Protein, ur: NEGATIVE mg/dL
SPECIFIC GRAVITY, URINE: 1.019 (ref 1.005–1.030)

## 2016-05-16 LAB — GLUCOSE, CAPILLARY
GLUCOSE-CAPILLARY: 69 mg/dL (ref 65–99)
GLUCOSE-CAPILLARY: 67 mg/dL (ref 65–99)
Glucose-Capillary: 57 mg/dL — ABNORMAL LOW (ref 65–99)
Glucose-Capillary: 207 mg/dL — ABNORMAL HIGH (ref 65–99)
Glucose-Capillary: 129 mg/dL — ABNORMAL HIGH (ref 65–99)

## 2016-05-16 LAB — LACTIC ACID, PLASMA: Lactic Acid, Venous: 1 mmol/L (ref 0.5–1.9)

## 2016-05-16 LAB — PROCALCITONIN: Procalcitonin: 0.67 ng/mL

## 2016-05-16 MED ORDER — DEXTROSE 50 % IV SOLN
INTRAVENOUS | Status: AC
  Administered 2016-05-16: 13:00:00
  Filled 2016-05-16: qty 50

## 2016-05-16 MED ORDER — DAKINS (1/4 STRENGTH) 0.125 % EX SOLN
Freq: Two times a day (BID) | CUTANEOUS | Status: AC
  Administered 2016-05-17 – 2016-05-18 (×3)
  Filled 2016-05-16: qty 473

## 2016-05-16 MED ORDER — COLLAGENASE 250 UNIT/GM EX OINT
TOPICAL_OINTMENT | Freq: Every day | CUTANEOUS | Status: DC
  Administered 2016-05-19 – 2016-05-20 (×2): via TOPICAL
  Filled 2016-05-16: qty 30

## 2016-05-16 NOTE — Consult Note (Signed)
WOC Nurse wound consult note Reason for Consult: Unstageable pressure injury to sacrum, also to bilateral heels.  Right hip DTPI, left hip with Stage 1 pressure injury. Incontinence associated dermatitis in the perianal area. Wound type: pressure, moisture associated skin damage (MASD). Pressure Injury POA: Yes/No Measurement: 1. Right hip with Deep Tissue Pressure Injury (DTPI) measuring 3.5cm x 4cm with purple/maroon discoloration. 2. Right heel with 3.5cm x 7cm Unstageable PrI. Stable eschar (black). Depth is obscured by the presence of necrotic tissue. 3. Left Heel:  4cm x 7.5cm  Unstageable Pressure Injury.  Stable eschar (black).  Depth is obscured by the presence of necrotic tissue. 4. Left hip:  6cm x 4cm Stage 1 pressure injury (blanching erythema.  Satellite lesion measuring 0.4cm round noted at 7 o'clock is purple. 5. Sacral Ulcer:  8cm x 7.5cm full thickness Unstageable PrI with undermining measuring 2cm at 6 o'clock.  Wound bed is 100% necrotic, depth obscured by the presence of stringy, yellow slough. Wound bed:As described above. Drainage (amount, consistency, odor) Yellow drainage consistent with autolytically debriding yellow slough.  Malodorous. Periwound: Areas that are not ulcerated are intact and dry. Dressing procedure/placement/frequency: I will implement a multi-faceted POC today including a mattress with low air loss feature, bilateral pressure redistribution heel boots and guidance for nursing regarding turning and repositioning. It is difficult to keep her off of involved areas as the only unaffected turning surface is the left hip and even that presents with a Stage 1 pressure injury.  We will begin twice daily betadine swabstick application to the heel eschars in an effort to dry and disinfect them.  No dressings.  The right hip will require a foam dressing and frequent monitoring.  The sacral wound (Unstageable) will require 3 days of twice daily sodium hypochlorite  solution dressing applications.  On Tuesday, we will begin once daily collagenase application. I have consulted with PT for hydrotherapy and they cannot perform today due to time of consult and existing workload.  Routine is to perform Monday through Saturday, but if opportunity permits they may be able to perform tomorrow.  If not, Nursing will apply Dakin's solution dressings and Hydrotherapy with conservative sharp wound debridement (CSWD) will begin on Monday. WOC nursing team will not follow closely, but will remain available to this patient, the nursing, physical therapy and medical teams.  Please re-consult if needed in between visits. Thanks, Ladona Mow, MSN, RN, GNP, [place]  Pager# [phone]

## 2016-05-16 NOTE — Progress Notes (Signed)
Stage II wounds noted to left and right buttocks. Right buttocks wound packed and covered with 4x4 and sacral foam dressing. Left buttocks wounds cleaned and covered with sacral dressing. Bilateral heel wounds changed and covered with foam dressing. Small ulceration noted to the anterior 2nd toe on the right foot.  Right hip noted to be red with small open area beginning to form. Site cleansed and covered with foam dressing.
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
## 2016-05-16 NOTE — Progress Notes (Signed)
PROGRESS NOTE [facility]   Rolanda Campa   RUE:[id number] DOB: 1923/08/26  DOA: 05/15/2016 PCP: Lemont Fillers., NP   Brief Narrative:  83 year old female with medical history significant for dementia, chronic nonhealing decubiti ulcer, DM, hypertension presented to the emergency department after being sent from the wound care center due to hypotension. She was symptomatic presentation and denies any fever or chills. Patient daughter's report she had an unstageable decubitus ulcer that opened about 3-4 days ago with purulent drainage. Patient was admitted for suspected sepsis due to ulcer infection. Patient was started on IV antibiotic and wound care was consulted. Sacral x-ray does not show bony involvement.  Subjective: Patient sen and examined, no very interactive due to dementia. Daughter report she is at baseline. No acute events overnight   Assessment & Plan: Sepsis 2/2 to suspected infected sacral ulcer.  Continue IV abx  Wound care recommendations appreciated  Sed rate elevated  Sacral xray show no bony involvement  Wound was cultured post abx Follow up blood cx NGTD  Will obtain surgical consult  Lactic acid wnl levels -was 4 last night likely lab error  Sacral decubitus ulcer unstegeable  See above   DM type 2  SSI  Holding metformin/Glipizide   CKD stage 3 Stable  Monitor BMP in AM   DVT prophylaxis: Heparin  Code Status: DNR  Family Communication: Daughter at bedside  Disposition Plan: Home when medically stable   Consultants:   Surgery   Procedures:   None   Antimicrobials:  Zosyn 05/16/16  Vanco 05/16/16   Objective: Vitals:   05/15/16 2030 05/15/16 2111 05/16/16 0425 05/16/16 1609  BP: 122/60 134/87 120/68 (!) 110/50  Pulse: 92 (!) 101 91 99  Resp: Temp:  97.8 F (36.6 C) 98.8 F (37.1 C) 98.9 F (37.2 C)  TempSrc:   Oral Axillary  SpO2: 98% 100% 99% 95%  Weight:      Height:        Intake/Output Summary  (Last 24 hours) at 05/16/16 1652 Last data filed at 05/16/16 1000  Gross per 24 hour  Intake          2245.83 ml  Output             1200 ml  Net          1045.83 ml   Filed Weights   05/15/16 1453  Weight: 61.2 kg (135 lb)    Examination:  General exam: NAD Respiratory system: Clear to auscultation Cardiovascular system: S1S2 RRR Gastrointestinal system: Abdomen is nondistended, soft and nontender.  Central nervous system: Alert but confused  Extremities: No pedal edema.   Skin: Decubitus ulcer, deep wound with purulent drainage, see WOC note for full description   Data Reviewed: I have personally reviewed following labs and imaging studies  CBC:  Recent Labs Lab 05/15/16 1552 05/15/16 2243 05/16/16 0128  WBC 14.3* 12.2* 9.9  NEUTROABS 11.4* 9.6*  --   HGB 10.2* 9.8* 9.0*  HCT 30.8* 29.8* 27.5*  MCV 89.8 89.8 89.6  PLT 415* 350 354   Basic Metabolic Panel:  Recent Labs Lab 05/15/16 1552 05/15/16 2243 05/16/16 0128  NA 143 141 143  K 4.2 4.5 3.5  CL 106 111 112*  CO2 25 20* 23  GLUCOSE 120* 86 81  BUN 24* 21* 20  CREATININE 0.99 0.83 0.80  CALCIUM 9.6 8.4* 8.3*   GFR: Estimated Creatinine Clearance: 45.2 mL/min (by C-G formula based on SCr of 0.8 mg/dL). Liver  Function Tests:  Recent Labs Lab 05/15/16 1552 05/15/16 2243 05/16/16 0128  AST ALT 12* 12* 11*  ALKPHOS 73 64 58  BILITOT 1.0 1.1 0.9  PROT 6.8 6.0* 5.7*  ALBUMIN 2.7* 2.4* 2.2*   No results for input(s): LIPASE, AMYLASE in the last 168 hours. No results for input(s): AMMONIA in the last 168 hours. Coagulation Profile:  Recent Labs Lab 05/15/16 2243  INR 1.24   Cardiac Enzymes:  Recent Labs Lab 05/15/16 2243  TROPONINI <0.03   BNP (last 3 results) No results for input(s): PROBNP in the last 8760 hours. HbA1C: No results for input(s): HGBA1C in the last 72 hours. CBG:  Recent Labs Lab 05/15/16 2125 05/16/16 0751 05/16/16 0837 05/16/16 1150 05/16/16 1256    GLUCAP 111* 57* 69 67 207*   Lipid Profile: No results for input(s): CHOL, HDL, LDLCALC, TRIG, CHOLHDL, LDLDIRECT in the last 72 hours. Thyroid Function Tests: No results for input(s): TSH, T4TOTAL, FREET4, T3FREE, THYROIDAB in the last 72 hours. Anemia Panel: No results for input(s): VITAMINB12, FOLATE, FERRITIN, TIBC, IRON, RETICCTPCT in the last 72 hours. Sepsis Labs:  Recent Labs Lab 05/15/16 1604 05/15/16 1907 05/15/16 2243 05/16/16 0128  PROCALCITON  --   --  0.67  --   LATICACIDVEN 1.78 4.04* 1.3 1.0    Recent Results (from the past 240 hour(s))  Blood Culture (routine x 2)     Status: None (Preliminary result)   Collection Time: 05/15/16  3:52 PM  Result Value Ref Range Status   Specimen Description BLOOD LEFT ANTECUBITAL  Final   Special Requests   Final    BOTTLES DRAWN AEROBIC AND ANAEROBIC Blood Culture adequate volume   Culture   Final    NO GROWTH < 24 HOURS Performed at [facility], [address], [place], [place] [address]    Report Status PENDING  Incomplete  Blood Culture (routine x 2)     Status: None (Preliminary result)   Collection Time: 05/15/16  6:58 PM  Result Value Ref Range Status   Specimen Description BLOOD RIGHT ANTECUBITAL  Final   Special Requests IN PEDIATRIC BOTTLE Blood Culture adequate volume  Final   Culture   Final    NO GROWTH < 24 HOURS Performed at [facility], [address] [place], [place] [address]    Report Status PENDING  Incomplete      Radiology Studies: Dg Sacrum/coccyx  Result Date: 05/16/2016 CLINICAL DATA:  Sacral decubitus ulcer EXAM: SACRUM AND COCCYX - 2+ VIEW COMPARISON:  04/03/2016 FINDINGS: Osteopenia and degenerative changes noted of the visualize sacrum, pelvis and hips. Sacral ala appear symmetric and intact. No displaced fracture or malalignment. On the lateral view, there is air within the superficial sacral soft tissues compatible with a decubitus ulcer. Underlying sacrum and coccyx  appear intact. No definite acute osseous finding by plain radiography. Aortoiliac atherosclerosis noted. IMPRESSION: Sacral decubitus ulcer. Osteopenia and degenerative changes. No significant or acute osseous finding by plain radiography. Electronically Signed   By: Judie Petit.  Shick M.D.   On: 05/16/2016 09:47   Dg Chest Port 1 View  Result Date: 05/15/2016 CLINICAL DATA:  83 year old female with a history of hypotension. EXAM: PORTABLE CHEST 1 VIEW COMPARISON:  04/18/2014 FINDINGS: Cardiomediastinal silhouette unchanged in size and contour. Calcifications of the aortic arch. Similar appearance of reticular pattern of opacity involving the bilateral lungs, more pronounced in the upper lungs above the hila. Mixed nodular opacities. No new confluent airspace  disease. No pleural effusion. No pneumothorax. pleuroparenchymal thickening at the apices. No displaced fracture. IMPRESSION: Overall there is a similar background pattern of architectural distortion and chronic lung changes/interstitial disease in this patient with a history of sarcoid. Although there is no clear evidence of new airspace disease, a superimposed acute process cannot be excluded. Aortic atherosclerosis. Electronically Signed   By: Gilmer Mor D.O.   On: 05/15/2016 17:25    Scheduled Meds: . acidophilus  1 capsule Oral Daily  . cholecalciferol  1,000 Units Oral Daily  . [START ON 05/19/2016] collagenase   Topical Daily  . heparin  5,000 Units Subcutaneous Q8H  . insulin aspart  0-9 Units Subcutaneous TID WC  . insulin glargine  5 Units Subcutaneous QHS  . mirtazapine  30 mg Oral QHS  . pantoprazole  40 mg Oral Daily  . piperacillin-tazobactam (ZOSYN)  IV  3.375 g Intravenous Q8H  . simvastatin  40 mg Oral QHS  . sodium hypochlorite   Irrigation BID  . torsemide  5 mg Oral Daily  . vancomycin  750 mg Intravenous Q24H   Continuous Infusions: . sodium chloride 125 mL/hr at 05/16/16 4098     LOS: 1 day    Latrelle Dodrill, MD Pager:  Text Page via [email or web address]  [phone]  If 7PM-7AM, please contact night-coverage [email or web address] Password [facility] 05/16/2016, 4:52 PM

## 2016-05-17 LAB — BASIC METABOLIC PANEL
Anion gap: 5 (ref 5–15)
BUN: 13 mg/dL (ref 6–20)
CALCIUM: 8.1 mg/dL — AB (ref 8.9–10.3)
CO2: 23 mmol/L (ref 22–32)
CREATININE: 0.74 mg/dL (ref 0.44–1.00)
Chloride: 116 mmol/L — ABNORMAL HIGH (ref 101–111)
GFR calc Af Amer: >60 mL/min (ref >60)
GFR calc non Af Amer: >60 mL/min (ref >60)
Glucose, Bld: 126 mg/dL — ABNORMAL HIGH (ref 65–99)
POTASSIUM: 3.6 mmol/L (ref 3.5–5.1)
Sodium: 144 mmol/L (ref 135–145)

## 2016-05-17 LAB — GLUCOSE, CAPILLARY
GLUCOSE-CAPILLARY: 108 mg/dL — AB (ref 65–99)
GLUCOSE-CAPILLARY: 96 mg/dL (ref 65–99)
Glucose-Capillary: 83 mg/dL (ref 65–99)
Glucose-Capillary: 74 mg/dL (ref 65–99)

## 2016-05-17 LAB — CBC WITH DIFFERENTIAL/PLATELET
BASOS ABS: 0.1 10*3/uL (ref 0.0–0.1)
BASOS PCT: 1 %
EOS ABS: 0.3 10*3/uL (ref 0.0–0.7)
Eosinophils Relative: 3 %
HEMATOCRIT: 25.8 % — AB (ref 36.0–46.0)
HEMOGLOBIN: 8.4 g/dL — AB (ref 12.0–15.0)
Lymphocytes Relative: 10 %
Lymphs Abs: 1 10*3/uL (ref 0.7–4.0)
MCH: 29.4 pg (ref 26.0–34.0)
MCHC: 32.6 g/dL (ref 30.0–36.0)
MCV: 90.2 fL (ref 78.0–100.0)
Monocytes Absolute: 0.9 10*3/uL (ref 0.1–1.0)
Monocytes Relative: 9 %
NEUTROS ABS: 7.7 10*3/uL (ref 1.7–7.7)
NEUTROS PCT: 77 %
Platelets: 331 10*3/uL (ref 150–400)
RBC: 2.86 MIL/uL — ABNORMAL LOW (ref 3.87–5.11)
RDW: 16 % — ABNORMAL HIGH (ref 11.5–15.5)
WBC: 10 10*3/uL (ref 4.0–10.5)

## 2016-05-17 LAB — HEMOGLOBIN A1C
Hgb A1c MFr Bld: 6.6 % — ABNORMAL HIGH (ref 4.8–5.6)
MEAN PLASMA GLUCOSE: 143 mg/dL

## 2016-05-17 MED ORDER — ADULT MULTIVITAMIN W/MINERALS CH
1.0000 | ORAL_TABLET | Freq: Every day | ORAL | Status: DC
  Administered 2016-05-17 – 2016-05-20 (×4): 1 via ORAL
  Filled 2016-05-17 (×4): qty 1

## 2016-05-17 MED ORDER — ENSURE ENLIVE PO LIQD
237.0000 mL | Freq: Three times a day (TID) | ORAL | Status: DC
  Administered 2016-05-17 – 2016-05-20 (×8): 237 mL via ORAL

## 2016-05-17 NOTE — Progress Notes (Signed)
PROGRESS NOTE [facility]   Rodneisha Bonnet   ZOX:[id number] DOB: Jun 10, 1923  DOA: 05/15/2016 PCP: Lemont Fillers., NP   Brief Narrative:  83 year old female with medical history significant for dementia, chronic nonhealing decubiti ulcer, DM, hypertension presented to the emergency department after being sent from the wound care center due to hypotension. She was symptomatic presentation and denies any fever or chills. Patient daughter's report she had an unstageable decubitus ulcer that opened about 3-4 days ago with purulent drainage. Patient was admitted for suspected sepsis due to ulcer infection. Patient was started on IV antibiotic and wound care was consulted. Sacral x-ray does not show bony involvement.  Subjective: Patient seen and examined more awake and interactive responding well to questions. She is screaming "no"daughter reported this is baseline. Surgical team performed a debridement of the ulcer.  Assessment & Plan: Sepsis 2/2 to suspected infected sacral ulcer.  Continue IV abx for now - likely to transition to oral in the morning and monitor. Poor chance of healing Surgical team appreciated Wound care recommendations appreciated  Sed rate elevated  Sacral xray show no bony involvement  Wound was cultured post abx Follow up blood cx NGTD  Follow-up wound culture Lactic acid wnl levels -was 4 last night likely lab error  Sacral decubitus ulcer unstegeable  See above   DM type 2  SSI  Holding metformin/Glipizide   CKD stage 3 Stable  Monitor BMP in AM   DVT prophylaxis: Heparin  Code Status: DNR  Family Communication: Daughter at bedside  Disposition Plan: Likely to be discharged in 24-48 hours, home with daughter as a caregiver  Consultants:   Surgery   Procedures:   None   Antimicrobials:  Zosyn 05/16/16  Vanco 05/16/16   Objective: Vitals:   05/16/16 1711 05/16/16 2226 05/17/16 0657 05/17/16 1332  BP:  97/71 115/61 118/60  Pulse:   (!) 106 100 90  Resp:  Temp:  98.4 F (36.9 C) 98.1 F (36.7 C) 98.6 F (37 C)  TempSrc:  Axillary Axillary Axillary  SpO2: 99% 97% 97% 100%  Weight:      Height:        Intake/Output Summary (Last 24 hours) at 05/17/16 1650 Last data filed at 05/17/16 8119  Gross per 24 hour  Intake          2323.33 ml  Output              700 ml  Net          1623.33 ml   Filed Weights   05/15/16 1453  Weight: 61.2 kg (135 lb)    Examination:  General exam: Sitting up, calm and competitive Respiratory system: Clear bilaterally Cardiovascular system: Regular rate and rhythm Gastrointestinal system: Abdomen soft and nondistended Central nervous system: Alert and conversant but not oriented Extremities: No lower extremity edema   Skin: Picture prior to debridement     Data Reviewed: I have personally reviewed following labs and imaging studies  CBC:  Recent Labs Lab 05/15/16 1552 05/15/16 2243 05/16/16 0128 05/17/16 0535  WBC 14.3* 12.2* 9.9 10.0  NEUTROABS 11.4* 9.6*  --  7.7  HGB 10.2* 9.8* 9.0* 8.4*  HCT 30.8* 29.8* 27.5* 25.8*  MCV 89.8 89.8 89.6 90.2  PLT 415* 350 354 331   Basic Metabolic Panel:  Recent Labs Lab 05/15/16 1552 05/15/16 2243 05/16/16 0128 05/17/16 0535  NA 143 141 143 144  K 4.2 4.5 3.5 3.6  CL 106 111 112* 116*  CO2 25 20* 23 23  GLUCOSE 120* 86 81 126*  BUN 24* 21* 20 13  CREATININE 0.99 0.83 0.80 0.74  CALCIUM 9.6 8.4* 8.3* 8.1*   GFR: Estimated Creatinine Clearance: 45.2 mL/min (by C-G formula based on SCr of 0.74 mg/dL). Liver Function Tests:  Recent Labs Lab 05/15/16 1552 05/15/16 2243 05/16/16 0128  AST ALT 12* 12* 11*  ALKPHOS 73 64 58  BILITOT 1.0 1.1 0.9  PROT 6.8 6.0* 5.7*  ALBUMIN 2.7* 2.4* 2.2*   No results for input(s): LIPASE, AMYLASE in the last 168 hours. No results for input(s): AMMONIA in the last 168 hours. Coagulation Profile:  Recent Labs Lab 05/15/16 2243  INR 1.24   Cardiac  Enzymes:  Recent Labs Lab 05/15/16 2243  TROPONINI <0.03   BNP (last 3 results) No results for input(s): PROBNP in the last 8760 hours. HbA1C:  Recent Labs  05/16/16 0128  HGBA1C 6.6*   CBG:  Recent Labs Lab 05/16/16 1256 05/16/16 1714 05/17/16 0734 05/17/16 1146 05/17/16 1616  GLUCAP 207* 129* 108* 83 74   Lipid Profile: No results for input(s): CHOL, HDL, LDLCALC, TRIG, CHOLHDL, LDLDIRECT in the last 72 hours. Thyroid Function Tests: No results for input(s): TSH, T4TOTAL, FREET4, T3FREE, THYROIDAB in the last 72 hours. Anemia Panel: No results for input(s): VITAMINB12, FOLATE, FERRITIN, TIBC, IRON, RETICCTPCT in the last 72 hours. Sepsis Labs:  Recent Labs Lab 05/15/16 1604 05/15/16 1907 05/15/16 2243 05/16/16 0128  PROCALCITON  --   --  0.67  --   LATICACIDVEN 1.78 4.04* 1.3 1.0    Recent Results (from the past 240 hour(s))  Blood Culture (routine x 2)     Status: None (Preliminary result)   Collection Time: 05/15/16  3:52 PM  Result Value Ref Range Status   Specimen Description BLOOD LEFT ANTECUBITAL  Final   Special Requests   Final    BOTTLES DRAWN AEROBIC AND ANAEROBIC Blood Culture adequate volume   Culture   Final    NO GROWTH 2 DAYS Performed at [facility], [address], [place], [place] [address]    Report Status PENDING  Incomplete  Blood Culture (routine x 2)     Status: None (Preliminary result)   Collection Time: 05/15/16  6:58 PM  Result Value Ref Range Status   Specimen Description BLOOD RIGHT ANTECUBITAL  Final   Special Requests IN PEDIATRIC BOTTLE Blood Culture adequate volume  Final   Culture   Final    NO GROWTH 2 DAYS Performed at [facility], [address] [place], [place] [address]    Report Status PENDING  Incomplete  Urine culture     Status: None (Preliminary result)   Collection Time: 05/16/16 10:00 AM  Result Value Ref Range Status   Specimen Description URINE, CATHETERIZED  Final   Special  Requests NONE  Final   Culture   Final    CULTURE REINCUBATED FOR BETTER GROWTH Performed at [facility], [address] [place], [place] [address]    Report Status PENDING  Incomplete  Aerobic Culture (superficial specimen)     Status: None (Preliminary result)   Collection Time: 05/16/16 10:20 AM  Result Value Ref Range Status   Specimen Description SACRAL  Final   Special Requests NONE  Final   Gram Stain   Final    DEGENERATED CELLULAR MATERIAL PRESENT MODERATE GRAM NEGATIVE COCCOBACILLI MODERATE GRAM POSITIVE COCCI IN PAIRS FEW GRAM POSITIVE RODS Performed  at [facility], [address], [place], [place] [address]    Culture PENDING  Incomplete   Report Status PENDING  Incomplete      Radiology Studies: Dg Sacrum/coccyx  Result Date: 05/16/2016 CLINICAL DATA:  Sacral decubitus ulcer EXAM: SACRUM AND COCCYX - 2+ VIEW COMPARISON:  04/03/2016 FINDINGS: Osteopenia and degenerative changes noted of the visualize sacrum, pelvis and hips. Sacral ala appear symmetric and intact. No displaced fracture or malalignment. On the lateral view, there is air within the superficial sacral soft tissues compatible with a decubitus ulcer. Underlying sacrum and coccyx appear intact. No definite acute osseous finding by plain radiography. Aortoiliac atherosclerosis noted. IMPRESSION: Sacral decubitus ulcer. Osteopenia and degenerative changes. No significant or acute osseous finding by plain radiography. Electronically Signed   By: Judie Petit.  Shick M.D.   On: 05/16/2016 09:47   Dg Chest Port 1 View  Result Date: 05/15/2016 CLINICAL DATA:  83 year old female with a history of hypotension. EXAM: PORTABLE CHEST 1 VIEW COMPARISON:  04/18/2014 FINDINGS: Cardiomediastinal silhouette unchanged in size and contour. Calcifications of the aortic arch. Similar appearance of reticular pattern of opacity involving the bilateral lungs, more pronounced in the upper lungs above the hila. Mixed nodular opacities.  No new confluent airspace disease. No pleural effusion. No pneumothorax. pleuroparenchymal thickening at the apices. No displaced fracture. IMPRESSION: Overall there is a similar background pattern of architectural distortion and chronic lung changes/interstitial disease in this patient with a history of sarcoid. Although there is no clear evidence of new airspace disease, a superimposed acute process cannot be excluded. Aortic atherosclerosis. Electronically Signed   By: Gilmer Mor D.O.   On: 05/15/2016 17:25    Scheduled Meds: . acidophilus  1 capsule Oral Daily  . cholecalciferol  1,000 Units Oral Daily  . [START ON 05/19/2016] collagenase   Topical Daily  . feeding supplement (ENSURE ENLIVE)  237 mL Oral TID BM  . heparin  5,000 Units Subcutaneous Q8H  . insulin aspart  0-9 Units Subcutaneous TID WC  . insulin glargine  5 Units Subcutaneous QHS  . mirtazapine  30 mg Oral QHS  . multivitamin with minerals  1 tablet Oral Daily  . pantoprazole  40 mg Oral Daily  . piperacillin-tazobactam (ZOSYN)  IV  3.375 g Intravenous Q8H  . simvastatin  40 mg Oral QHS  . sodium hypochlorite   Irrigation BID  . torsemide  5 mg Oral Daily  . vancomycin  750 mg Intravenous Q24H   Continuous Infusions: . sodium chloride 75 mL/hr at 05/17/16 0733     LOS: 2 days    Latrelle Dodrill, MD Pager: Text Page via [email or web address]  [phone]  If 7PM-7AM, please contact night-coverage [email or web address] Password [email or web address] 05/17/2016, 4:50 PM

## 2016-05-17 NOTE — Progress Notes (Signed)
Initial Nutrition Assessment  DOCUMENTATION CODES:   Severe malnutrition in context of chronic illness  INTERVENTION:   Recommend diet liberalization given advanced age and poor PO intakes. Provide Ensure Enlive po TID, each supplement provides 350 kcal and 20 grams of protein Provide Multivitamin with minerals daily  RD to continue to monitor for plan  NUTRITION DIAGNOSIS:   Malnutrition related to chronic illness (dementia) as evidenced by percent weight loss, energy intake < or equal to 75% for > or equal to 1 month, moderate depletion of body fat, moderate depletions of muscle mass.  GOAL:   Patient will meet greater than or equal to 90% of their needs  MONITOR:   PO intake, Supplement acceptance, Labs, Weight trends, Skin, I & O's  REASON FOR ASSESSMENT:   Consult Wound healing  ASSESSMENT:   83 year old female with medical history significant for dementia, chronic nonhealing decubiti ulcer, DM, hypertension presented to the emergency department after being sent from the wound care center due to hypotension. She was symptomatic presentation and denies any fever or chills. Patient daughter's report she had an unstageable decubitus ulcer that opened about 3-4 days ago with purulent drainage. Patient was admitted for suspected sepsis due to ulcer infection. Patient was started on IV antibiotic and wound care was consulted. Sacral x-ray does not show bony involvement. 4/8: s/p wound debridement at bedside  Pt with daughter at bedside. Pt sleeping after receiving pain medications. Pt's daughter states this is one of the barriers to getting the patient to eat is her lethargy with medications. States she ate a few spoonfuls of yogurt and soup and a few sips of iced tea. This is typical intake per pt's daughter. Pt's daughter states the wound care center advised the family to liberalize the patient's diet. Would agree if pt's PO intake is this poor. Pt's daughter states the pt's  blood sugars have been in normal range until this hospitalization.   RD to order Ensure supplements to provide additional kcal and protein given increased needs from multiple wounds.  Per chart review, pt has lost 18 lb since 2/23 (12% wt loss x 1.5 months, significant for time frame). Nutrition-Focused physical exam completed. Findings are mild-moderate fat depletion, moderate muscle depletion, and unable to assess for edema.   Labs reviewed. Medications: Vitamin D tablet daily, Remeron tablet daily, Protonix tablet daily  Diet Order:  Diet Carb Modified Fluid consistency: Thin; Room service appropriate? Yes  Skin:   Stage I hip pressure injury, Stage II buttocks/sacral pressure injury, Unstageable bilateral heel injuries  Last BM:  4/7  Height:   Ht Readings from Last 1 Encounters:  05/15/16  (1.702 m)    Weight:   Wt Readings from Last 1 Encounters:  05/15/16 135 lb (61.2 kg)    Ideal Body Weight:  61.4 kg  BMI:  Body mass index is 21.14 kg/m.  Estimated Nutritional Needs:   Kcal:  1550-1750  Protein:  70-80g  Fluid:  1.7L/day  EDUCATION NEEDS:   No education needs identified at this time  Tilda Franco, MS, RD, LDN Pager: [phone] After Hours Pager: [phone]

## 2016-05-17 NOTE — Progress Notes (Addendum)
[facility] Office:  [phone] General Surgery Progress Note   LOS: 2 days  POD -     Chief Complaint: Hurts all over - yells out  Assessment and Plan: 1.  Decubitus right buttocks/sacrum - 8 x 9 cm  [photo in chart]             On Zosyn and Vanc             Debrided wound at bedside today - no trapped necrotic tissue or abscess. Needs aggressive local wound care, but chance of healing is almost zero. Will follow while in hospital.  I do not expect needing any more significant debridement while here.  2.  Dementia 3.  Sepsis 4.  Asthma 5.  DM 6.  GERD 7.  HTN 8.  Sarcoid 9.  Anemia             Hgb - 8.4 - 05/17/2016 10.  Progressive disability 11.  DVT prophylaxis - SQ heparin   Principal Problem:   Severe sepsis (HCC) Active Problems:   Chronic kidney disease, stage III (moderate)   Pressure ulcer of unspecified buttock, stage 2   DM2 (diabetes mellitus, type 2) (HCC)  Subjective:  More conversant this morning.  Daughter at bedside  Objective:   Vitals:   05/16/16 2226 05/17/16 0657  BP: 97/71 115/61  Pulse: (!) 106 100  Resp: 18 18  Temp: 98.4 F (36.9 C) 98.1 F (36.7 C)     Intake/Output from previous day:  04/07 0701 - 04/08 0700 In: 2323.3 [I.V.:2073.3; IV Piggyback:250] Out: 1900 [Urine:1900]  Intake/Output this shift:  No intake/output data recorded.   Physical Exam:   General: thin older WF.  She is talking to me this AM.   HEENT: Normal. Pupils equal.   Wound: Wound with necrotic tissue - I debrided at bedside.  It looks relatively clean.   Lab Results:    Recent Labs  05/16/16 0128 05/17/16 0535  WBC 9.9 10.0  HGB 9.0* 8.4*  HCT 27.5* 25.8*  PLT 354 331    BMET   Recent Labs  05/16/16 0128 05/17/16 0535  NA 143 144  K 3.5 3.6  CL 112* 116*  CO2 23 23  GLUCOSE 81 126*  BUN 20 13  CREATININE 0.80 0.74  CALCIUM 8.3* 8.1*    PT/INR   Recent Labs  05/15/16 2243  LABPROT 15.7*  INR 1.24    ABG  No  results for input(s): PHART, HCO3 in the last 72 hours.  Invalid input(s): PCO2, PO2   Studies/Results:  Dg Sacrum/coccyx  Result Date: 05/16/2016 CLINICAL DATA:  Sacral decubitus ulcer EXAM: SACRUM AND COCCYX - 2+ VIEW COMPARISON:  04/03/2016 FINDINGS: Osteopenia and degenerative changes noted of the visualize sacrum, pelvis and hips. Sacral ala appear symmetric and intact. No displaced fracture or malalignment. On the lateral view, there is air within the superficial sacral soft tissues compatible with a decubitus ulcer. Underlying sacrum and coccyx appear intact. No definite acute osseous finding by plain radiography. Aortoiliac atherosclerosis noted. IMPRESSION: Sacral decubitus ulcer. Osteopenia and degenerative changes. No significant or acute osseous finding by plain radiography. Electronically Signed   By: Judie Petit.  Shick M.D.   On: 05/16/2016 09:47   Dg Chest Port 1 View  Result Date: 05/15/2016 CLINICAL DATA:  83 year old female with a history of hypotension. EXAM: PORTABLE CHEST 1 VIEW COMPARISON:  04/18/2014 FINDINGS: Cardiomediastinal silhouette unchanged in size and contour. Calcifications of the aortic arch. Similar appearance of reticular pattern  of opacity involving the bilateral lungs, more pronounced in the upper lungs above the hila. Mixed nodular opacities. No new confluent airspace disease. No pleural effusion. No pneumothorax. pleuroparenchymal thickening at the apices. No displaced fracture. IMPRESSION: Overall there is a similar background pattern of architectural distortion and chronic lung changes/interstitial disease in this patient with a history of sarcoid. Although there is no clear evidence of new airspace disease, a superimposed acute process cannot be excluded. Aortic atherosclerosis. Electronically Signed   By: Gilmer Mor D.O.   On: 05/15/2016 17:25     Anti-infectives:   Anti-infectives    Start     Dose/Rate Route Frequency Ordered Stop   05/16/16 1800  vancomycin  (VANCOCIN) IVPB 750 mg/150 ml premix  Status:  Discontinued     750 mg 150 mL/hr over 60 Minutes Intravenous Every 24 hours 05/15/16 1657 05/15/16 2120   05/16/16 1800  vancomycin (VANCOCIN) IVPB 750 mg/150 ml premix     750 mg 150 mL/hr over 60 Minutes Intravenous Every 24 hours 05/15/16 2124     05/16/16 0200  piperacillin-tazobactam (ZOSYN) IVPB 3.375 g  Status:  Discontinued     3.375 g 12.5 mL/hr over 240 Minutes Intravenous Every 8 hours 05/15/16 1657 05/15/16 2120   05/16/16 0200  piperacillin-tazobactam (ZOSYN) IVPB 3.375 g     3.375 g 12.5 mL/hr over 240 Minutes Intravenous Every 8 hours 05/15/16 2124     05/15/16 1515  piperacillin-tazobactam (ZOSYN) IVPB 3.375 g     3.375 g 100 mL/hr over 30 Minutes Intravenous  Once 05/15/16 1512 05/15/16 1856   05/15/16 1515  vancomycin (VANCOCIN) IVPB 1000 mg/200 mL premix     1,000 mg 200 mL/hr over 60 Minutes Intravenous  Once 05/15/16 1512 05/15/16 2016      Ovidio Kin, MD, FACS Pager: [phone] [facility] Office: [phone] 05/17/2016

## 2016-05-17 NOTE — Op Note (Addendum)
   7:28 AM  PATIENT:  Elizabeth Keller, 83 y.o., female, MRN: [id number]  PREOP DIAGNOSIS:  Necrotic right buttock/sacral decubiti  [photo at end of note]  POSTOP DIAGNOSIS:   Necrotic right buttock/sacral decubiti  PROCEDURE:   Excisional debridement of buttocks/sacral decubiti  SURGEON:   Ovidio Kin, M.D.  ANESTHESIA:   None  EBL:  minimal  ml  SPECIMEN:   None  INDICATIONS FOR PROCEDURE:  Elizabeth Keller is a 83 y.o. (DOB: [birth year]) AA female whose primary care physician is Lemont Fillers., NP.  I did a debridement at the bedside of her right buttocks/sacrum.  PROCEDURE: The patient was in her room in 1413.  She was turned on her left lateral decubitus position.  I debrided the right lateral edge of the decubitus - skin, subcutaneous fat, and small amount of muscle.  I then packed the wound with Dakan's solution soaked 4" Curlex and put a decubitus prevention dressing over the wound.  She tolerated the procedure well.  1.  Tool used for debridement (curette, scapel, etc.)  Scissors  2.  Frequency of surgical debridement.   Once  3.  Measurement of total devitalized tissue (wound surface) before and after surgical debridement.   6 x 9 cm before debridement --> 8 x 9 cm after debridment  4.  Area and depth of devitalized tissue removed from wound.  2 x 4 cm area of dead skin, subcutaneous fat, muscle  5.  Blood loss and description of tissue removed.  Dead/necrotic skin, subcutaneous fat, muscles  6.  Evidence of the progress of the wound's response to treatment.  A.  Current wound volume.  8 x 9 cm  B.  Presence of infection.  Minimal  C.  Presence of non viable tissue.  A little   Wound before debridement  Ovidio Kin, MD, [facility] Pager: [phone] Office phone:  [phone]

## 2016-05-18 DIAGNOSIS — L89302 Pressure ulcer of unspecified buttock, stage 2: Secondary | ICD-10-CM

## 2016-05-18 LAB — GLUCOSE, CAPILLARY
GLUCOSE-CAPILLARY: 87 mg/dL (ref 65–99)
GLUCOSE-CAPILLARY: 108 mg/dL — AB (ref 65–99)
Glucose-Capillary: 159 mg/dL — ABNORMAL HIGH (ref 65–99)
Glucose-Capillary: 52 mg/dL — ABNORMAL LOW (ref 65–99)
Glucose-Capillary: 98 mg/dL (ref 65–99)
Glucose-Capillary: 111 mg/dL — ABNORMAL HIGH (ref 65–99)

## 2016-05-18 LAB — URINE CULTURE

## 2016-05-18 LAB — BASIC METABOLIC PANEL
ANION GAP: 7 (ref 5–15)
BUN: 10 mg/dL (ref 6–20)
CO2: 22 mmol/L (ref 22–32)
Calcium: 8.2 mg/dL — ABNORMAL LOW (ref 8.9–10.3)
Chloride: 117 mmol/L — ABNORMAL HIGH (ref 101–111)
Creatinine, Ser: 0.73 mg/dL (ref 0.44–1.00)
GFR calc Af Amer: >60 mL/min (ref >60)
GLUCOSE: 49 mg/dL — AB (ref 65–99)
POTASSIUM: 3.1 mmol/L — AB (ref 3.5–5.1)
Sodium: 146 mmol/L — ABNORMAL HIGH (ref 135–145)

## 2016-05-18 MED ORDER — SODIUM CHLORIDE 0.9 % IV SOLN
Freq: Once | INTRAVENOUS | Status: AC
  Administered 2016-05-18: 09:00:00 via INTRAVENOUS
  Filled 2016-05-18: qty 1000

## 2016-05-18 MED ORDER — FLUCONAZOLE 100 MG PO TABS
200.0000 mg | ORAL_TABLET | Freq: Once | ORAL | Status: AC
  Administered 2016-05-18: 200 mg via ORAL
  Filled 2016-05-18: qty 2

## 2016-05-18 NOTE — Progress Notes (Signed)
Assumed care of patient at this time. Patient is stable with no complaints at this time. Agree with previously documented assessment. Will continue to monitor patient.   

## 2016-05-18 NOTE — Progress Notes (Signed)
Pt complained of not being able to breathe. Requested a breathing treatment.respiratory has been called.  Blood sugar 52 this am. Pt asymptomatic. Currently eating breakfast. Orange juice given. Will recheck blood glucose level.

## 2016-05-18 NOTE — Progress Notes (Signed)
Blood sugar currently 87. Dr. Lenox Ponds notified.

## 2016-05-18 NOTE — Progress Notes (Addendum)
HYDROTHERAPY EVALUATION      05/18/16 1600  Subjective Assessment  Subjective "it hurts"  Patient and Family Stated Goals for pt to get better  Date of Onset (unsure-family states around Feb)  Prior Treatments wound care center  Evaluation and Treatment  Evaluation and Treatment Procedures Explained to Patient/Family Yes (pt and family)  Evaluation and Treatment Procedures agreed to (pt and family)  Pressure Injury-Sacrum  Date First Assessed/Time First Assessed: 05/18/16 (by PT)   Location: Sacrum Location  Dressing Type Foam;Dakin's soaked Kerlix packing;Gauze (Comment) (Dakin's)  Dressing Changed  Dressing Change Frequency Twice a day  State of Healing Non-healing  Site / Wound Assessment Pink;Yellow;Red  % Wound base Red or Granulating 30%  % Wound base Yellow/Fibrinous Exudate 70%  Peri-wound Assessment Intact  Wound Length (cm) 9 cm  Wound Width (cm) 7 cm  Wound Depth (cm) 2.5 cm  Tunneling (cm) (1:00-2:00-4 cm; 3:00-2.5 cm; 6:00-1.5 cm)  Margins Unattached edges (unapproximated)  Drainage Amount Moderate  Drainage Description Odor;Serosanguineous  Treatment Debridement (Selective);Hydrotherapy (Pulse lavage)  Hydrotherapy  Pulsed lavage therapy - wound location sacrum  Pulsed Lavage with Suction (psi) 8 psi  Pulsed Lavage with Suction - Normal Saline Used 1000 mL  Pulsed Lavage Tip Tip with splash shield  Selective Debridement  Selective Debridement - Location sacrum  Selective Debridement - Tools Used Forceps;Scissors  Selective Debridement - Tissue Removed yellow/brown tissue around wound edge  Wound Therapy - Assess/Plan/Recommendations  Wound Therapy - Clinical Statement 83 yo female admitted with sepsis. Pt was going to wound care center for wound care. Hx of dementia, chronic decubitus ulcers.  Factors Delaying/Impairing Wound Healing Diabetes Mellitus;Incontinence;Immobility;Multiple medical problems  Hydrotherapy Plan Debridement;Dressing  change;Patient/family education;Pulsatile lavage with suction  Wound Therapy - Frequency 6X / week  Wound Therapy - Current Recommendations Case manager/social work  Wound Therapy - Follow Up Recommendations Home health RN;Skilled nursing facility  Wound Plan Hydrotherapy and dressing changes 6x week to facilitate wound healing  Wound Therapy Goals - Improve the function of patient's integumentary system by progressing the wound(s) through the phases of wound healing by:  Decrease Necrotic Tissue to 50%  Increase Granulation Tissue to 50%  Improve Drainage Characteristics Min  Goals/treatment plan/discharge plan were made with and agreed upon by patient/family Yes  Time For Goal Achievement 2 weeks  Wound Therapy - Potential for Goals Poor   Rebeca Alert, MPT [phone]

## 2016-05-18 NOTE — Progress Notes (Signed)
Severe sepsis (HCC)  Subjective: No complaints  Objective: Vital signs in last 24 hours: Temp:  [98.3 F (36.8 C)-98.6 F (37 C)] 98.3 F (36.8 C) (04/08 2116) Pulse Rate:  [87-90] 87 (04/08 2116) Resp:  [18] 18 (04/08 2116) BP: (107-118)/(60-65) 107/65 (04/08 2116) SpO2:  [100 %] 100 % (04/08 2116) Last BM Date: 05/16/16  Intake/Output from previous day: 04/08 0701 - 04/09 0700 In: 2400 [I.V.:2100; IV Piggyback:300] Out: 300 [Urine:300] Intake/Output this shift: No intake/output data recorded.  Incision/Wound: clean, no obvious necrosis noted  Lab Results:  Results for orders placed or performed during the hospital encounter of 05/15/16 (from the past 24 hour(s))  Glucose, capillary     Status: None   Collection Time: 05/17/16 11:46 AM  Result Value Ref Range   Glucose-Capillary 83 65 - 99 mg/dL  Glucose, capillary     Status: None   Collection Time: 05/17/16  4:16 PM  Result Value Ref Range   Glucose-Capillary 74 65 - 99 mg/dL  Glucose, capillary     Status: None   Collection Time: 05/17/16 11:39 PM  Result Value Ref Range   Glucose-Capillary 96 65 - 99 mg/dL  Basic metabolic panel     Status: Abnormal   Collection Time: 05/18/16  5:35 AM  Result Value Ref Range   Sodium 146 (H) 135 - 145 mmol/L   Potassium 3.1 (L) 3.5 - 5.1 mmol/L   Chloride 117 (H) 101 - 111 mmol/L   CO2 22 22 - 32 mmol/L   Glucose, Bld 49 (L) 65 - 99 mg/dL   BUN 10 6 - 20 mg/dL   Creatinine, Ser 1.61 0.44 - 1.00 mg/dL   Calcium 8.2 (L) 8.9 - 10.3 mg/dL   GFR calc non Af Amer >60 >60 mL/min   GFR calc Af Amer >60 >60 mL/min   Anion gap 7 5 - 15     Studies/Results Radiology     MEDS, Scheduled . acidophilus  1 capsule Oral Daily  . cholecalciferol  1,000 Units Oral Daily  . [START ON 05/19/2016] collagenase   Topical Daily  . feeding supplement (ENSURE ENLIVE)  237 mL Oral TID BM  . heparin  5,000 Units Subcutaneous Q8H  . insulin aspart  0-9 Units Subcutaneous TID WC  .  insulin glargine  5 Units Subcutaneous QHS  . mirtazapine  30 mg Oral QHS  . multivitamin with minerals  1 tablet Oral Daily  . pantoprazole  40 mg Oral Daily  . piperacillin-tazobactam (ZOSYN)  IV  3.375 g Intravenous Q8H  . simvastatin  40 mg Oral QHS  . 0.9 % sodium chloride with kcl   Intravenous Once  . sodium hypochlorite   Irrigation BID  . torsemide  5 mg Oral Daily  . vancomycin  750 mg Intravenous Q24H     Assessment: Severe sepsis (HCC) Necrotic pressure ulcer   Plan: Cont packing Wound RN to eval today for regimen I do not think she will need further debridement in the OR.   Can f/u in wound clinic    LOS: 3 days    Vanita Panda, MD [facility], [place] [phone]   05/18/2016 8:17 AM

## 2016-05-18 NOTE — Progress Notes (Signed)
Inpatient Diabetes Program Recommendations  AACE/ADA: New Consensus Statement on Inpatient Glycemic Control (2015)  Target Ranges:  Prepandial:   less than 140 mg/dL      Peak postprandial:   less than 180 mg/dL (1-2 hours)      Critically ill patients:  140 - 180 mg/dL   Lab Results  Component Value Date   GLUCAP 108 (H) 05/18/2016   HGBA1C 6.6 (H) 05/16/2016    Review of Glycemic Control:  Results for PURITY, IRMEN (MRN [id number]) as of 05/18/2016 12:47  Ref. Range 05/16/2016 21:55 05/17/2016 07:34 05/17/2016 11:46 05/17/2016 16:16 05/17/2016 23:39 05/18/2016 08:39 05/18/2016 09:23 05/18/2016 12:05  Glucose-Capillary Latest Ref Range: 65 - 99 mg/dL 409 (H) 811 (H) 83 74 96 52 (L) 87 108 (H)   Diabetes history: Type 2 diabetes Outpatient Diabetes medications: Glipizide XL 10 mg bid, Metformin 500 mg daily Current orders for Inpatient glycemic control:  Lantus 5 units daily, Novolog sensitive tid with meals  Inpatient Diabetes Program Recommendations:   Please consider d/c of Lantus 5 units daily.  Text page sent.  Thanks, Beryl Meager, RN, BC-ADM Inpatient Diabetes Coordinator Pager [phone] (8a-5p)

## 2016-05-18 NOTE — Progress Notes (Addendum)
PROGRESS NOTE [facility]   Elizabeth Keller   QMV:[id number] DOB: [birth year]  DOA: 05/15/2016 PCP: Lemont Fillers., NP   Brief Narrative:  83 year old female with medical history significant for dementia, chronic nonhealing decubiti ulcer, DM, hypertension presented to the emergency department after being sent from the wound care center due to hypotension. She was symptomatic presentation and denies any fever or chills. Patient daughter's report she had an unstageable decubitus ulcer that opened about 3-4 days ago with purulent drainage. Patient was admitted for suspected sepsis due to ulcer infection. Patient was started on IV antibiotic and wound care was consulted. Sacral x-ray does not show bony involvement.  Subjective: Patient doing much better.  No acute events overnight.   Assessment & Plan: Sepsis 2/2 to suspected infected sacral ulcer.  Continue IV abx for now - likely to transition to oral in the morning and monitor. Poor chance of healing Surgical team appreciated - no need more debridement - follow up as outpatient at the wound care center  Wound care recommendations appreciated  Sed rate elevated  Sacral xray show no bony involvement  Wound was cultured post abx Follow up blood cx NGTD  Lactic acid wnl levels -was 4 last night likely lab error  Sacral decubitus ulcer unstegeable  Follow-up wound culture - few pseudomonas - will discuss with ID   Yeast in Urine Cx  Will give 1 dose of diflucan   Poor appetite  [place] 30 mg qHS   Hypokalemia  Replete  Check BMP in AM   DM type 2  SSI  Holding metformin/Glipizide   CKD stage 3 Stable  Monitor BMP in AM   DVT prophylaxis: Heparin  Code Status: DNR  Family Communication: Daughter at bedside  Disposition Plan: Likely to be discharged in AM, home with daughter as a caregiver  Consultants:   Surgery   Procedures:   None   Antimicrobials:  Zosyn 05/16/16  Vanco  05/16/16   Objective: Vitals:   05/16/16 2226 05/17/16 0657 05/17/16 1332 05/17/16 2116  BP: 97/71 115/61 118/60 107/65  Pulse: (!) 106 100 90 87  Resp: Temp: 98.4 F (36.9 C) 98.1 F (36.7 C) 98.6 F (37 C) 98.3 F (36.8 C)  TempSrc: Axillary Axillary Axillary Axillary  SpO2: 97% 97% 100% 100%  Weight:      Height:        Intake/Output Summary (Last 24 hours) at 05/18/16 1726 Last data filed at 05/18/16 1145  Gross per 24 hour  Intake             3000 ml  Output              500 ml  Net             2500 ml   Filed Weights   05/15/16 1453  Weight: 61.2 kg (135 lb)    Examination:  General exam: NAD Respiratory system: CTA Cardiovascular system: S1S2 Gastrointestinal system: Soft NTND Central nervous system: Alert and conversant but not oriented Extremities: No LE edema Skin: Picture prior to debridement     Data Reviewed: I have personally reviewed following labs and imaging studies  CBC:  Recent Labs Lab 05/15/16 1552 05/15/16 2243 05/16/16 0128 05/17/16 0535  WBC 14.3* 12.2* 9.9 10.0  NEUTROABS 11.4* 9.6*  --  7.7  HGB 10.2* 9.8* 9.0* 8.4*  HCT 30.8* 29.8* 27.5* 25.8*  MCV 89.8 89.8 89.6 90.2  PLT 415* 350 354 331  Basic Metabolic Panel:  Recent Labs Lab 05/15/16 1552 05/15/16 2243 05/16/16 0128 05/17/16 0535 05/18/16 0535  NA 143 141 143 144 146*  K 4.2 4.5 3.5 3.6 3.1*  CL 106 111 112* 116* 117*  CO2 25 20* GLUCOSE 120* 86 81 126* 49*  BUN 24* 21* CREATININE 0.99 0.83 0.80 0.74 0.73  CALCIUM 9.6 8.4* 8.3* 8.1* 8.2*   GFR: Estimated Creatinine Clearance: 45.2 mL/min (by C-G formula based on SCr of 0.73 mg/dL). Liver Function Tests:  Recent Labs Lab 05/15/16 1552 05/15/16 2243 05/16/16 0128  AST ALT 12* 12* 11*  ALKPHOS 73 64 58  BILITOT 1.0 1.1 0.9  PROT 6.8 6.0* 5.7*  ALBUMIN 2.7* 2.4* 2.2*   No results for input(s): LIPASE, AMYLASE in the last 168 hours. No results for  input(s): AMMONIA in the last 168 hours. Coagulation Profile:  Recent Labs Lab 05/15/16 2243  INR 1.24   Cardiac Enzymes:  Recent Labs Lab 05/15/16 2243  TROPONINI <0.03   BNP (last 3 results) No results for input(s): PROBNP in the last 8760 hours. HbA1C:  Recent Labs  05/16/16 0128  HGBA1C 6.6*   CBG:  Recent Labs Lab 05/17/16 2339 05/18/16 0839 05/18/16 0923 05/18/16 1205 05/18/16 1710  GLUCAP 96 52* 87 108* 98   Lipid Profile: No results for input(s): CHOL, HDL, LDLCALC, TRIG, CHOLHDL, LDLDIRECT in the last 72 hours. Thyroid Function Tests: No results for input(s): TSH, T4TOTAL, FREET4, T3FREE, THYROIDAB in the last 72 hours. Anemia Panel: No results for input(s): VITAMINB12, FOLATE, FERRITIN, TIBC, IRON, RETICCTPCT in the last 72 hours. Sepsis Labs:  Recent Labs Lab 05/15/16 1604 05/15/16 1907 05/15/16 2243 05/16/16 0128  PROCALCITON  --   --  0.67  --   LATICACIDVEN 1.78 4.04* 1.3 1.0    Recent Results (from the past 240 hour(s))  Blood Culture (routine x 2)     Status: None (Preliminary result)   Collection Time: 05/15/16  3:52 PM  Result Value Ref Range Status   Specimen Description BLOOD LEFT ANTECUBITAL  Final   Special Requests   Final    BOTTLES DRAWN AEROBIC AND ANAEROBIC Blood Culture adequate volume   Culture   Final    NO GROWTH 2 DAYS Performed at [facility], [address] [place], [place] [address]    Report Status PENDING  Incomplete  Blood Culture (routine x 2)     Status: None (Preliminary result)   Collection Time: 05/15/16  6:58 PM  Result Value Ref Range Status   Specimen Description BLOOD RIGHT ANTECUBITAL  Final   Special Requests IN PEDIATRIC BOTTLE Blood Culture adequate volume  Final   Culture   Final    NO GROWTH 2 DAYS Performed at [facility], [address], [place], [place] [address]    Report Status PENDING  Incomplete  Urine culture     Status: Abnormal   Collection Time: 05/16/16 10:00 AM   Result Value Ref Range Status   Specimen Description URINE, CATHETERIZED  Final   Special Requests NONE  Final   Culture 60,000 COLONIES/mL YEAST (A)  Final   Report Status 05/18/2016 FINAL  Final  Aerobic Culture (superficial specimen)     Status: None (Preliminary result)   Collection Time: 05/16/16 10:20 AM  Result Value Ref Range Status   Specimen Description SACRAL  Final   Special Requests NONE  Final   Gram Stain  Final    DEGENERATED CELLULAR MATERIAL PRESENT MODERATE GRAM NEGATIVE COCCOBACILLI MODERATE GRAM POSITIVE COCCI IN PAIRS FEW GRAM POSITIVE RODS Performed at [facility], [address], [place], [place] [address]    Culture FEW PSEUDOMONAS AERUGINOSA  Final   Report Status PENDING  Incomplete     Radiology Studies: No results found.  Scheduled Meds: . acidophilus  1 capsule Oral Daily  . cholecalciferol  1,000 Units Oral Daily  . [START ON 05/19/2016] collagenase   Topical Daily  . feeding supplement (ENSURE ENLIVE)  237 mL Oral TID BM  . heparin  5,000 Units Subcutaneous Q8H  . insulin aspart  0-9 Units Subcutaneous TID WC  . mirtazapine  30 mg Oral QHS  . multivitamin with minerals  1 tablet Oral Daily  . pantoprazole  40 mg Oral Daily  . piperacillin-tazobactam (ZOSYN)  IV  3.375 g Intravenous Q8H  . simvastatin  40 mg Oral QHS  . sodium hypochlorite   Irrigation BID  . torsemide  5 mg Oral Daily  . vancomycin  750 mg Intravenous Q24H   Continuous Infusions:    LOS: 3 days    Latrelle Dodrill, MD Pager: Text Page via [email or web address]  [phone]  If 7PM-7AM, please contact night-coverage [email or web address] Password [facility] 05/18/2016, 5:26 PM

## 2016-05-18 NOTE — Progress Notes (Signed)
Pharmacy Antibiotic Note  Elizabeth Keller is a 83 y.o. female admitted on 05/15/2016 with sepsis.  Pharmacy has been consulted for vancomycin and zosyn dosing. Patient presents from wound center with hypotension.  No fevers noted, but HR is elevated.  3/9 UCx growing amp-sensitive enterococcus but no pyuria on UA  Plan:  Continue Vancomycin  IV q24h (received 1g on 4/6)  Check trough as steady state if remains on vancomycin   Continue Zosyn 3.375gm IV q8h over 4h infusion  Daily SCr as data suggest increased risk of nephrotoxicity with above antibiotic combo   De-escalate when appropriate  Height:  (170.2 cm) Weight: 135 lb (61.2 kg) IBW/kg (Calculated) : 61.6  Temp (24hrs), Avg:98.5 F (36.9 C), Min:98.3 F (36.8 C), Max:98.6 F (37 C)   Recent Labs Lab 05/15/16 1552 05/15/16 1604 05/15/16 1907 05/15/16 2243 05/16/16 0128 05/17/16 0535 05/18/16 0535  WBC 14.3*  --   --  12.2* 9.9 10.0  --   CREATININE 0.99  --   --  0.83 0.80 0.74 0.73  LATICACIDVEN  --  1.78 4.04* 1.3 1.0  --   --     Estimated Creatinine Clearance: 45.2 mL/min (by C-G formula based on SCr of 0.73 mg/dL).    Allergies  Allergen Reactions  . Dicyclomine Other (See Comments)    Reaction:  Dizziness   . Gabapentin Other (See Comments)    Reaction:  Dizziness   . Metformin And Related Nausea And Vomiting  . Lyrica [Pregabalin] Other (See Comments)    Reaction:  Unknown  . Topiramate Other (See Comments)    Reaction:  Unknown    Antimicrobials this admission: 4/6 Vancomycin >> 4/6 Zosyn >>  Dose adjustments this admission:  Microbiology results: 3/9: UCx: enterococcus faecalis: amp susc, vanco susc (No pyuria seen on UA) 4/6 BCx: NGTD 4/6 UCx: 60k yeast 4/7 Sacral wound cx: moderate gram neg coccobacilli, GPC in pairs, few GPR, culture pending   Thank you for allowing pharmacy to be a part of this patient's care.  Loralee Pacas, PharmD, BCPS Pager: [phone] 05/18/2016 11:25  AM

## 2016-05-19 DIAGNOSIS — R338 Other retention of urine: Secondary | ICD-10-CM

## 2016-05-19 LAB — BASIC METABOLIC PANEL
Anion gap: 6 (ref 5–15)
BUN: 9 mg/dL (ref 6–20)
CO2: 21 mmol/L — ABNORMAL LOW (ref 22–32)
CREATININE: 0.7 mg/dL (ref 0.44–1.00)
Calcium: 8.2 mg/dL — ABNORMAL LOW (ref 8.9–10.3)
Chloride: 114 mmol/L — ABNORMAL HIGH (ref 101–111)
GFR calc Af Amer: >60 mL/min (ref >60)
GFR calc non Af Amer: >60 mL/min (ref >60)
GLUCOSE: 121 mg/dL — AB (ref 65–99)
POTASSIUM: 4.5 mmol/L (ref 3.5–5.1)
SODIUM: 141 mmol/L (ref 135–145)

## 2016-05-19 LAB — FERRITIN: FERRITIN: 382 ng/mL — AB (ref 11–307)

## 2016-05-19 LAB — IRON AND TIBC
IRON: 14 ug/dL — AB (ref 28–170)
SATURATION RATIOS: 14 % (ref 10.4–31.8)
TIBC: 98 ug/dL — AB (ref 250–450)
UIBC: 84 ug/dL

## 2016-05-19 LAB — CBC
HCT: 25.9 % — ABNORMAL LOW (ref 36.0–46.0)
HEMOGLOBIN: 8.2 g/dL — AB (ref 12.0–15.0)
MCH: 28 pg (ref 26.0–34.0)
MCHC: 31.7 g/dL (ref 30.0–36.0)
MCV: 88.4 fL (ref 78.0–100.0)
Platelets: 397 10*3/uL (ref 150–400)
RBC: 2.93 MIL/uL — ABNORMAL LOW (ref 3.87–5.11)
RDW: 16.2 % — ABNORMAL HIGH (ref 11.5–15.5)
WBC: 9.4 10*3/uL (ref 4.0–10.5)

## 2016-05-19 LAB — GLUCOSE, CAPILLARY
GLUCOSE-CAPILLARY: 103 mg/dL — AB (ref 65–99)
GLUCOSE-CAPILLARY: 136 mg/dL — AB (ref 65–99)
Glucose-Capillary: 124 mg/dL — ABNORMAL HIGH (ref 65–99)
Glucose-Capillary: 110 mg/dL — ABNORMAL HIGH (ref 65–99)

## 2016-05-19 LAB — RETICULOCYTES
RBC.: 2.92 MIL/uL — ABNORMAL LOW (ref 3.87–5.11)
Retic Count, Absolute: 49.6 10*3/uL (ref 19.0–186.0)
Retic Ct Pct: 1.7 % (ref 0.4–3.1)

## 2016-05-19 LAB — MAGNESIUM: MAGNESIUM: 1.7 mg/dL (ref 1.7–2.4)

## 2016-05-19 LAB — VITAMIN B12: Vitamin B-12: 563 pg/mL (ref 180–914)

## 2016-05-19 LAB — FOLATE: Folate: 10.4 ng/mL (ref >5.9)

## 2016-05-19 MED ORDER — SODIUM CHLORIDE 0.9 % IV BOLUS (SEPSIS)
1000.0000 mL | Freq: Once | INTRAVENOUS | Status: AC
  Administered 2016-05-19: 1000 mL via INTRAVENOUS

## 2016-05-19 MED ORDER — AMOXICILLIN-POT CLAVULANATE 875-125 MG PO TABS
1.0000 | ORAL_TABLET | Freq: Two times a day (BID) | ORAL | Status: DC
  Administered 2016-05-19 – 2016-05-20 (×2): 1 via ORAL
  Filled 2016-05-19 (×2): qty 1

## 2016-05-19 NOTE — Progress Notes (Signed)
   05/19/16 1200  Subjective Assessment  Subjective pt randomly yells out "ahhhh"  Patient and Family Stated Goals for pt's wound to get better  Prior Treatments wound care center  Evaluation and Treatment  Evaluation and Treatment Procedures Explained to Patient/Family Yes  Evaluation and Treatment Procedures agreed to  Pressure Injury Sacrum Unstageable  Date First Assessed/Time First Assessed: 05/18/16 (by PT)  Location: Sacrum   Stage: Unstageable  Dressing Type Santyl; Saline Kerlix packing (tape to secure foam dressing edges)  Dressing Changed  Dressing Change Frequency Twice a day  State of Healing Non-healing  % Wound base Red or Granulating 30%  % Wound base Yellow/Fibrinous Exudate 70%  Peri-wound Assessment Pink;Excoriated  Margins Unattached edges (unapproximated)  Drainage Amount Moderate  Drainage Description Odor;Serosanguineous  Treatment Hydrotherapy (Pulse lavage) (Santyl; Saline Kerlix packing)  Hydrotherapy  Pulsed lavage therapy - wound location sacrum  Pulsed Lavage with Suction (psi) 8 psi  Pulsed Lavage with Suction - Normal Saline Used 1000 mL  Pulsed Lavage Tip Tip with splash shield  Selective Debridement  Selective Debridement - Location sacrum  Wound Therapy - Assess/Plan/Recommendations  Wound Therapy - Clinical Statement 83 yo female admitted with sepsis. Pt was going to wound care center for wound care. Hx of dementia, chronic decubitus ulcers.  Factors Delaying/Impairing Wound Healing Diabetes Mellitus;Incontinence;Immobility;Multiple medical problems  Hydrotherapy Plan Debridement;Dressing change;Patient/family education;Pulsatile lavage with suction  Wound Therapy - Frequency 6X / week  Wound Therapy - Current Recommendations Case manager/social work  Wound Therapy - Follow Up Recommendations Home health RN;Skilled nursing facility  Wound Plan Hydrotherapy and dressing changes 6x week to facilitate wound healing  Wound Therapy Goals - Improve the  function of patient's integumentary system by progressing the wound(s) through the phases of wound healing by:  Decrease Necrotic Tissue - Progress Progressing toward goal  Increase Granulation Tissue - Progress Progressing toward goal  Goals/treatment plan/discharge plan were made with and agreed upon by patient/family Yes  Time For Goal Achievement 2 weeks  Wound Therapy - Potential for Goals Poor   Rebeca Alert, MPT [phone]

## 2016-05-19 NOTE — Care Management Note (Signed)
Case Management Note  Patient Details  Name: Mikeala Girdler MRN: [id number] Date of Birth: December 07, 1923  Subjective/Objective: Spoke to dtr in rm w/attending-dtr already has [facility]-will continue to use them for [facility] @ d/c-rep Kenard Gower aware of HHRN-specific wound care orders, HHPT/HHOT.[facility] rep Kim-aware of dme-air loss mattress needed-[facility] dme will deliver air mattess-they will contact dtr-[name](per dtr mattress does not have to arrive to home prior patient @ d/c)  dtr will provide own transport home in own vehicle. No further CM needs.                  Action/Plan:d/c home w/HHC/dme   Expected Discharge Date:   (unknown)               Expected Discharge Plan:  Home w Home Health Services  In-House Referral:     Discharge planning Services  CM Consult  Post Acute Care Choice:  Durable Medical Equipment (hospital bed-[facility]) Choice offered to:  Adult Children  DME Arranged:  Other see comment (low air loss mattress) DME Agency:  [facility]  HH Arranged:  RN, PT, OT, Nurse's Aide HH Agency:  [facility] [place])  Status of Service:  Completed, signed off  If discussed at Long Length of Stay Meetings, dates discussed:    Additional Comments:  Lanier Clam, RN 05/19/2016, 11:47 AM

## 2016-05-19 NOTE — Care Management Important Message (Signed)
Important Message  Patient Details  Name: Elizabeth Keller MRN: [id number] Date of Birth: 1923/05/18   Medicare Important Message Given:  Yes    Caren Macadam 05/19/2016, 11:18 AMImportant Message  Patient Details  Name: Elizabeth Keller MRN: [id number] Date of Birth: [birth year]   Medicare Important Message Given:  Yes    Caren Macadam 05/19/2016, 11:18 AM

## 2016-05-19 NOTE — Progress Notes (Deleted)
Patient transferred from ICU. VSS, tele applied. Denies any needs at this time, will continue to monitor.  

## 2016-05-19 NOTE — Progress Notes (Addendum)
PROGRESS NOTE [facility]   Elizabeth Keller   ZOX:[id number] DOB: 08/24/1923  DOA: 05/15/2016 PCP: Lemont Fillers., NP   Brief Narrative:  83 year old female with medical history significant for dementia, chronic nonhealing decubiti ulcer, DM, hypertension presented to the emergency department after being sent from the wound care center due to hypotension. She was symptomatic presentation and denies any fever or chills. Patient daughter's report she had an unstageable decubitus ulcer that opened about 3-4 days ago with purulent drainage. Patient was admitted for suspected sepsis due to ulcer infection. Patient was started on IV antibiotic and wound care was consulted. Sacral x-ray does not show bony involvement.  Subjective: Patient seen and examined, no acute events overnight. Has not voided. Not drinking much fluids   Assessment & Plan: Sepsis 2/2 to suspected infected sacral ulcer.  Continue IV abx for now - transitioned to oral abx Augmentin for completion purposes  Poor chance of healing Surgical team appreciated - no need more debridement - follow up as outpatient at the wound care center  Wound care recommendations appreciated  Sed rate elevated  Sacral xray show no bony involvement  Wound was cultured post abx Follow up blood cx NGTD  Lactic acid wnl levels -was 4 last night likely lab error  Sacral decubitus ulcer unstegeable  Follow-up wound culture - few pseudomonas - will discuss with ID - Addendum discussed with ID since patient is doing clinically better will continue Augmentin for x 5 days, unknown if this is a colonization. This was not a deep tissue culture or a culture on the OR.   Urinary retention  Initially retained >500 cc - foley cath was placed  Discontinue foley today, voiding trial, has not voided. Bladder scan 177 cc, patient behind on fluid  Will give a bolus of IVF, monitor overnight if void does not occur repeat bladder scan, If > 500 place a  foley and follow up with urology.   Yeast in Urine Cx  Treated with Diflucan x 1   Poor appetite  Trail of [place] 30 mg qHS   Hypokalemia Resolved   DM type 2  SSI  Holding metformin/Glipizide   CKD stage 3 Stable  Monitor BMP in AM   DVT prophylaxis: Heparin  Code Status: DNR  Family Communication: Daughter at bedside  Disposition Plan: Likely to be discharged in AM, home with daughter as a caregiver  Consultants:   Surgery   Procedures:  Sacral debridement  Antimicrobials: Anti-infectives    Start     Dose/Rate Route Frequency Ordered Stop   05/19/16 2200  amoxicillin-clavulanate (AUGMENTIN) 875-125 MG per tablet 1 tablet     1 tablet Oral Every 12 hours 05/19/16 1647     05/18/16 1800  fluconazole (DIFLUCAN) tablet 200 mg     200 mg Oral  Once 05/18/16 1737 05/18/16 1804   05/16/16 1800  vancomycin (VANCOCIN) IVPB 750 mg/150 ml premix  Status:  Discontinued     750 mg 150 mL/hr over 60 Minutes Intravenous Every 24 hours 05/15/16 1657 05/15/16 2120   05/16/16 1800  vancomycin (VANCOCIN) IVPB 750 mg/150 ml premix  Status:  Discontinued     750 mg 150 mL/hr over 60 Minutes Intravenous Every 24 hours 05/15/16 2124 05/19/16 1647   05/16/16 0200  piperacillin-tazobactam (ZOSYN) IVPB 3.375 g  Status:  Discontinued     3.375 g 12.5 mL/hr over 240 Minutes Intravenous Every 8 hours 05/15/16 1657 05/15/16 2120   05/16/16 0200  piperacillin-tazobactam (ZOSYN) IVPB 3.375 g  Status:  Discontinued     3.375 g 12.5 mL/hr over 240 Minutes Intravenous Every 8 hours 05/15/16 2124 05/19/16 1647   05/15/16 1515  piperacillin-tazobactam (ZOSYN) IVPB 3.375 g     3.375 g 100 mL/hr over 30 Minutes Intravenous  Once 05/15/16 1512 05/15/16 1856   05/15/16 1515  vancomycin (VANCOCIN) IVPB 1000 mg/200 mL premix     1,000 mg 200 mL/hr over 60 Minutes Intravenous  Once 05/15/16 1512 05/15/16 2016       Objective: Vitals:   05/17/16 2116 05/18/16 2153 05/19/16 0600 05/19/16 1327  BP:  107/65 123/64 121/65 126/68  Pulse: 87 (!) 102 100 95  Resp: Temp: 98.3 F (36.8 C) 99.7 F (37.6 C) 99.1 F (37.3 C) 98.3 F (36.8 C)  TempSrc: Axillary Axillary Oral Oral  SpO2: 100% 99% 98% 99%  Weight:   62.2 kg (137 lb 3.2 oz)   Height:        Intake/Output Summary (Last 24 hours) at 05/19/16 1644 Last data filed at 05/19/16 0600  Gross per 24 hour  Intake                0 ml  Output              600 ml  Net             -600 ml   Filed Weights   05/15/16 1453 05/19/16 0600  Weight: 61.2 kg (135 lb) 62.2 kg (137 lb 3.2 oz)    Examination: - No changes in PE from 05/18/16  General exam: NAD Respiratory system: CTA Cardiovascular system: S1S2 Gastrointestinal system: Soft NTND Central nervous system: Alert and conversant but not oriented Extremities: No LE edema Skin: Picture prior to debridement     Data Reviewed: I have personally reviewed following labs and imaging studies  CBC:  Recent Labs Lab 05/15/16 1552 05/15/16 2243 05/16/16 0128 05/17/16 0535 05/19/16 0607  WBC 14.3* 12.2* 9.9 10.0 9.4  NEUTROABS 11.4* 9.6*  --  7.7  --   HGB 10.2* 9.8* 9.0* 8.4* 8.2*  HCT 30.8* 29.8* 27.5* 25.8* 25.9*  MCV 89.8 89.8 89.6 90.2 88.4  PLT 415* 350 354 331 397   Basic Metabolic Panel:  Recent Labs Lab 05/15/16 2243 05/16/16 0128 05/17/16 0535 05/18/16 0535 05/19/16 0607  NA 141 143 144 146* 141  K 4.5 3.5 3.6 3.1* 4.5  CL 111 112* 116* 117* 114*  CO2 20* 21*  GLUCOSE 86 81 126* 49* 121*  BUN 21* CREATININE 0.83 0.80 0.74 0.73 0.70  CALCIUM 8.4* 8.3* 8.1* 8.2* 8.2*  MG  --   --   --   --  1.7   GFR: Estimated Creatinine Clearance: 45.5 mL/min (by C-G formula based on SCr of 0.7 mg/dL). Liver Function Tests:  Recent Labs Lab 05/15/16 1552 05/15/16 2243 05/16/16 0128  AST ALT 12* 12* 11*  ALKPHOS 73 64 58  BILITOT 1.0 1.1 0.9  PROT 6.8 6.0* 5.7*  ALBUMIN 2.7* 2.4* 2.2*   No results for  input(s): LIPASE, AMYLASE in the last 168 hours. No results for input(s): AMMONIA in the last 168 hours. Coagulation Profile:  Recent Labs Lab 05/15/16 2243  INR 1.24   Cardiac Enzymes:  Recent Labs Lab 05/15/16 2243  TROPONINI <0.03   BNP (last 3 results) No results for input(s): PROBNP in the last 8760 hours. HbA1C: No results for input(s):  HGBA1C in the last 72 hours. CBG:  Recent Labs Lab 05/18/16 1205 05/18/16 1710 05/18/16 2150 05/19/16 0721 05/19/16 1130  GLUCAP 108* 98 111* 124* 110*   Lipid Profile: No results for input(s): CHOL, HDL, LDLCALC, TRIG, CHOLHDL, LDLDIRECT in the last 72 hours. Thyroid Function Tests: No results for input(s): TSH, T4TOTAL, FREET4, T3FREE, THYROIDAB in the last 72 hours. Anemia Panel:  Recent Labs  05/19/16 0857  VITAMINB12 563  FOLATE 10.4  FERRITIN 382*  TIBC 98*  IRON 14*  RETICCTPCT 1.7   Sepsis Labs:  Recent Labs Lab 05/15/16 1604 05/15/16 1907 05/15/16 2243 05/16/16 0128  PROCALCITON  --   --  0.67  --   LATICACIDVEN 1.78 4.04* 1.3 1.0    Recent Results (from the past 240 hour(s))  Blood Culture (routine x 2)     Status: None (Preliminary result)   Collection Time: 05/15/16  3:52 PM  Result Value Ref Range Status   Specimen Description BLOOD LEFT ANTECUBITAL  Final   Special Requests   Final    BOTTLES DRAWN AEROBIC AND ANAEROBIC Blood Culture adequate volume   Culture   Final    NO GROWTH 4 DAYS Performed at [facility], [address], [place], [place] [address]    Report Status PENDING  Incomplete  Blood Culture (routine x 2)     Status: None (Preliminary result)   Collection Time: 05/15/16  6:58 PM  Result Value Ref Range Status   Specimen Description BLOOD RIGHT ANTECUBITAL  Final   Special Requests IN PEDIATRIC BOTTLE Blood Culture adequate volume  Final   Culture   Final    NO GROWTH 4 DAYS Performed at [facility], [address], [place], [place] [address]    Report Status  PENDING  Incomplete  Urine culture     Status: Abnormal   Collection Time: 05/16/16 10:00 AM  Result Value Ref Range Status   Specimen Description URINE, CATHETERIZED  Final   Special Requests NONE  Final   Culture 60,000 COLONIES/mL YEAST (A)  Final   Report Status 05/18/2016 FINAL  Final  Aerobic Culture (superficial specimen)     Status: None (Preliminary result)   Collection Time: 05/16/16 10:20 AM  Result Value Ref Range Status   Specimen Description SACRAL  Final   Special Requests NONE  Final   Gram Stain   Final    DEGENERATED CELLULAR MATERIAL PRESENT MODERATE GRAM NEGATIVE COCCOBACILLI MODERATE GRAM POSITIVE COCCI IN PAIRS FEW GRAM POSITIVE RODS    Culture   Final    FEW PSEUDOMONAS AERUGINOSA RARE ENTEROCOCCUS FAECALIS CULTURE REINCUBATED FOR BETTER GROWTH Performed at [facility], [address] [place], [place] [address]    Report Status PENDING  Incomplete   Organism ID, Bacteria PSEUDOMONAS AERUGINOSA  Final      Susceptibility   Pseudomonas aeruginosa - MIC*    CEFTAZIDIME 2 SENSITIVE Sensitive     CIPROFLOXACIN <=0.25 SENSITIVE Sensitive     GENTAMICIN <=1 SENSITIVE Sensitive     IMIPENEM <=0.25 SENSITIVE Sensitive     PIP/TAZO 8 SENSITIVE Sensitive     CEFEPIME <=1 SENSITIVE Sensitive     * FEW PSEUDOMONAS AERUGINOSA     Radiology Studies: No results found.  Scheduled Meds: . acidophilus  1 capsule Oral Daily  . cholecalciferol  1,000 Units Oral Daily  . collagenase   Topical Daily  . feeding supplement (ENSURE ENLIVE)  237 mL Oral TID BM  . heparin  5,000  Units Subcutaneous Q8H  . insulin aspart  0-9 Units Subcutaneous TID WC  . mirtazapine  30 mg Oral QHS  . multivitamin with minerals  1 tablet Oral Daily  . pantoprazole  40 mg Oral Daily  . piperacillin-tazobactam (ZOSYN)  IV  3.375 g Intravenous Q8H  . simvastatin  40 mg Oral QHS  . torsemide  5 mg Oral Daily  . vancomycin  750 mg Intravenous Q24H   Continuous Infusions:     LOS: 4 days    Latrelle Dodrill, MD Pager: Text Page via [email or web address]  [phone]  If 7PM-7AM, please contact night-coverage [email or web address] Password [facility] 05/19/2016, 4:44 PM

## 2016-05-20 ENCOUNTER — Encounter: Payer: Self-pay | Admitting: Family

## 2016-05-20 ENCOUNTER — Ambulatory Visit: Payer: Medicare Other | Admitting: Family

## 2016-05-20 DIAGNOSIS — A4152 Sepsis due to Pseudomonas: Secondary | ICD-10-CM

## 2016-05-20 DIAGNOSIS — L98499 Non-pressure chronic ulcer of skin of other sites with unspecified severity: Secondary | ICD-10-CM

## 2016-05-20 LAB — CBC
HEMATOCRIT: 25.9 % — AB (ref 36.0–46.0)
HEMOGLOBIN: 8.1 g/dL — AB (ref 12.0–15.0)
MCH: 27.8 pg (ref 26.0–34.0)
MCHC: 31.3 g/dL (ref 30.0–36.0)
MCV: 89 fL (ref 78.0–100.0)
Platelets: 397 10*3/uL (ref 150–400)
RBC: 2.91 MIL/uL — AB (ref 3.87–5.11)
RDW: 16.3 % — ABNORMAL HIGH (ref 11.5–15.5)
WBC: 9 10*3/uL (ref 4.0–10.5)

## 2016-05-20 LAB — COMPREHENSIVE METABOLIC PANEL
ALBUMIN: 2 g/dL — AB (ref 3.5–5.0)
ALK PHOS: 108 U/L (ref 38–126)
ALT: 13 U/L — AB (ref 14–54)
ANION GAP: 6 (ref 5–15)
AST: 28 U/L (ref 15–41)
BUN: 9 mg/dL (ref 6–20)
CALCIUM: 8.2 mg/dL — AB (ref 8.9–10.3)
CO2: 22 mmol/L (ref 22–32)
Chloride: 112 mmol/L — ABNORMAL HIGH (ref 101–111)
Creatinine, Ser: 0.77 mg/dL (ref 0.44–1.00)
GFR calc non Af Amer: >60 mL/min (ref >60)
GLUCOSE: 140 mg/dL — AB (ref 65–99)
POTASSIUM: 4.6 mmol/L (ref 3.5–5.1)
SODIUM: 140 mmol/L (ref 135–145)
Total Bilirubin: 0.8 mg/dL (ref 0.3–1.2)
Total Protein: 5.2 g/dL — ABNORMAL LOW (ref 6.5–8.1)

## 2016-05-20 LAB — CULTURE, BLOOD (ROUTINE X 2)
CULTURE: NO GROWTH
Culture: NO GROWTH
Special Requests: ADEQUATE
Special Requests: ADEQUATE

## 2016-05-20 LAB — CREATININE, SERUM
CREATININE: 0.73 mg/dL (ref 0.44–1.00)
GFR calc Af Amer: >60 mL/min (ref >60)
GFR calc non Af Amer: >60 mL/min (ref >60)

## 2016-05-20 LAB — GLUCOSE, CAPILLARY
GLUCOSE-CAPILLARY: 127 mg/dL — AB (ref 65–99)
Glucose-Capillary: 119 mg/dL — ABNORMAL HIGH (ref 65–99)
Glucose-Capillary: 131 mg/dL — ABNORMAL HIGH (ref 65–99)

## 2016-05-20 MED ORDER — AMOXICILLIN-POT CLAVULANATE 875-125 MG PO TABS: 1.0000 | ORAL_TABLET | Freq: Two times a day (BID) | ORAL | 0 refills | Status: DC

## 2016-05-20 MED ORDER — AMOXICILLIN-POT CLAVULANATE 875-125 MG PO TABS: 1.0000 | ORAL_TABLET | Freq: Two times a day (BID) | ORAL | 0 refills | Status: AC

## 2016-05-20 MED ORDER — ENSURE ENLIVE PO LIQD: 237.0000 mL | Freq: Three times a day (TID) | ORAL | 12 refills | Status: DC

## 2016-05-20 MED ORDER — ADULT MULTIVITAMIN W/MINERALS CH: 1.0000 | ORAL_TABLET | Freq: Every day | ORAL | 0 refills | Status: DC

## 2016-05-20 MED ORDER — BISACODYL 10 MG RE SUPP: 10.0000 mg | Freq: Every day | RECTAL | 0 refills | Status: DC | PRN

## 2016-05-20 MED ORDER — BISACODYL 10 MG RE SUPP: 10.0000 mg | Freq: Every day | RECTAL | 0 refills | Status: AC | PRN

## 2016-05-20 NOTE — Consult Note (Signed)
WOC Nurse wound follow up Wound type:Pressure, now Stage 4.  Appreciate assistance and expertise of CCS for surgical debridement, PT with hydrotherapy and conservative sharp wound debridement.  Measurement:per PT note Wound bed: deepest structures are still not visible, but wound is a Stage 4 into deep muscle with bone palpable. Drainage (amount, consistency, odor) small amount of serous exudate Periwound:intact Dressing procedure/placement/frequency:Continue with Santyl impregnated saline moistened kerlix roll gauze, top with dry dressings and secure. Change daily.  HHRN ordered.  Plan for discharge to home today. WOC nursing team will not follow, but will remain available to this patient, the nursing and medical teams.  Please re-consult if needed. Thanks, Ladona Mow, MSN, RN, GNP, [place]  Pager# [phone]

## 2016-05-20 NOTE — Progress Notes (Signed)
Discharge instructions given over the phone to [facility], patient's HCPOA and daughter that patient will be living with at discharge.

## 2016-05-20 NOTE — Progress Notes (Signed)
[facility] Progress Note     Subjective: CC: leg pain with movement.  Objective: Vital signs in last 24 hours: Temp:  [97.4 F (36.3 C)-99.5 F (37.5 C)] 97.4 F (36.3 C) (04/11 0404) Pulse Rate:  [95-106] 106 (04/11 0404) Resp:  [19-20] 20 (04/11 0404) BP: (121-137)/(57-73) 137/73 (04/11 0404) SpO2:  [96 %-99 %] 97 % (04/11 0404) Last BM Date: 05/18/16  Intake/Output from previous day: 04/10 0701 - 04/11 0700 In: 183.3 [IV Piggyback:183.3] Out: 750 [Urine:750] Intake/Output this shift: No intake/output data recorded.  PE: Incision/Wound: clean, some yellow/fibrous slough over superior aspect of wound bed. no obvious necrosis noted. Mild surrounding undermining of wound edges. See below:      Lab Results:   Recent Labs  05/19/16 0607 05/20/16 0549  WBC 9.4 9.0  HGB 8.2* 8.1*  HCT 25.9* 25.9*  PLT 397 397   BMET  Recent Labs  05/19/16 0607 05/20/16 0549  NA 141 140  K 4.5 4.6  CL 114* 112*  CO2 21* 22  GLUCOSE 121* 140*  BUN 9 9  CREATININE 0.70 0.77  0.73  CALCIUM 8.2* 8.2*   PT/INR No results for input(s): LABPROT, INR in the last 72 hours. CMP     Component Value Date/Time   NA 140 05/20/2016 0549   K 4.6 05/20/2016 0549   CL 112 (H) 05/20/2016 0549   CO2 22 05/20/2016 0549   GLUCOSE 140 (H) 05/20/2016 0549   BUN 9 05/20/2016 0549   CREATININE 0.73 05/20/2016 0549   CREATININE 0.77 05/20/2016 0549   CALCIUM 8.2 (L) 05/20/2016 0549   PROT 5.2 (L) 05/20/2016 0549   ALBUMIN 2.0 (L) 05/20/2016 0549   AST 28 05/20/2016 0549   ALT 13 (L) 05/20/2016 0549   ALKPHOS 108 05/20/2016 0549   BILITOT 0.8 05/20/2016 0549   GFRNONAA >60 05/20/2016 0549   GFRNONAA >60 05/20/2016 0549   GFRAA >60 05/20/2016 0549   GFRAA >60 05/20/2016 0549   Lipase     Component Value Date/Time   LIPASE 24 04/03/2016 1435       Studies/Results: No results found.  Anti-infectives: Anti-infectives    Start     Dose/Rate Route Frequency  Ordered Stop   05/19/16 2200  amoxicillin-clavulanate (AUGMENTIN) 875-125 MG per tablet 1 tablet     1 tablet Oral Every 12 hours 05/19/16 1647     05/18/16 1800  fluconazole (DIFLUCAN) tablet 200 mg     200 mg Oral  Once 05/18/16 1737 05/18/16 1804   05/16/16 1800  vancomycin (VANCOCIN) IVPB 750 mg/150 ml premix  Status:  Discontinued     750 mg 150 mL/hr over 60 Minutes Intravenous Every 24 hours 05/15/16 1657 05/15/16 2120   05/16/16 1800  vancomycin (VANCOCIN) IVPB 750 mg/150 ml premix  Status:  Discontinued     750 mg 150 mL/hr over 60 Minutes Intravenous Every 24 hours 05/15/16 2124 05/19/16 1647   05/16/16 0200  piperacillin-tazobactam (ZOSYN) IVPB 3.375 g  Status:  Discontinued     3.375 g 12.5 mL/hr over 240 Minutes Intravenous Every 8 hours 05/15/16 1657 05/15/16 2120   05/16/16 0200  piperacillin-tazobactam (ZOSYN) IVPB 3.375 g  Status:  Discontinued     3.375 g 12.5 mL/hr over 240 Minutes Intravenous Every 8 hours 05/15/16 2124 05/19/16 1647   05/15/16 1515  piperacillin-tazobactam (ZOSYN) IVPB 3.375 g     3.375 g 100 mL/hr over 30 Minutes Intravenous  Once 05/15/16 1512 05/15/16 1856   05/15/16 1515  vancomycin (  VANCOCIN) IVPB 1000 mg/200 mL premix     1,000 mg 200 mL/hr over 60 Minutes Intravenous  Once 05/15/16 1512 05/15/16 2016     Assessment/Plan Necrotic pressure ulcer - wound appears clean. Anticipate that remaining slough will respond to hydroPT and continued bedside debridement. Continue current wound care and outpatient follow up at wound clinic    LOS: 5 days    Adam Phenix , [facility] 05/20/2016, 10:44 AM Pager: [phone] Consults: [phone] Mon-Fri 7:00 am-4:30 pm Sat-Sun 7:00 am-11:30 am

## 2016-05-20 NOTE — Progress Notes (Signed)
NUTRITION NOTE  Pt seen by RD for full assessment on 4/8. Pt on Carb Modified diet with 50% completion of lunch on 4/9 and no other intakes documented since that time. Per previous RD note, pt with dementia and eating very poorly (only bites) PTA and since admission. Spoke with daughter at bedside this AM who reports bites of meals is ongoing. Visualized breakfast tray with small bites of scrambled egg, yogurt, and a few sips of Ensure. Daughter states that pt ate had a few bites of hard boiled egg and pancake earlier this AM.  Pt set to d/c home today. Talked with Dr. Marland Mcalpine and confirmed ability to liberalize diet from Carb Modified to Regular for lunch today. RD will continue to follow per protocol if pt unable to d/c.    Trenton Gammon, MS, RD, LDN, [facility] Inpatient Clinical Dietitian Pager # [phone] After hours/weekend pager # [phone]

## 2016-05-20 NOTE — Progress Notes (Signed)
CSW spoke with patient's daughters at bedside. CSW informed patient's daughters about SNF process and advised them to follow up with Home Health SW.   Celso Sickle, [place] Clinical Social Worker [facility] Cell#: [phone]

## 2016-05-20 NOTE — Care Management Note (Signed)
Case Management Note  Patient Details  Name: Elizabeth Keller MRN: [id number] Date of Birth: [birth year]  Subjective/Objective:  MD paged since another daughter is by patient's rm crying that patient should go to a nursing home, & that she has general guardianship-attending says he has already d/c patient to home-I have contacted [facility] rep Kenard Gower to inform of social worker added to [facility] orders for resources to asst w/transition to SNF if needed.  CSW contacted to explore hierachy of decision making between the daughters-[name] has been @ the hospital when doctor discussed d/c plans.  No further CM needs.                  Action/Plan:d/c plan home w/HHC.   Expected Discharge Date:  05/20/16               Expected Discharge Plan:  Home w Home Health Services  In-House Referral:     Discharge planning Services  CM Consult  Post Acute Care Choice:  Durable Medical Equipment (hospital bed-[facility]) Choice offered to:  Adult Children  DME Arranged:  Other see comment (low air loss mattress) DME Agency:  [facility]  HH Arranged:  RN, PT, OT, Nurse's Aide, Social Work [employer] Agency:  [facility])  Status of Service:  Completed, signed off  If discussed at [employer], dates discussed:    Additional Comments:  Lanier Clam, RN 05/20/2016, 3:57 PM

## 2016-05-20 NOTE — Progress Notes (Signed)
RN approached by patient's daughter crying, stating that her sister is patient's HCPOA and not acting in best interest of patient.  States she has gone to court to take over emergency HCPOA for patient, that according to her mother's legal wishes she should have general guardianship over patient and that she wants patient to go to a 'nursing home' for proper care.  CM and CSW notified; MD notified; to continue with discharge at this time, with further resources given to patient's daughter to assist with placing patient in SNF if needed outpatient.    Assuming care of patient from previous RN.  Agree with previous RN's assessment of patient. Patient stable at this time. Will continue to monitor patient.

## 2016-05-20 NOTE — Telephone Encounter (Signed)
Please contact pt to arrange TCM follow up.  

## 2016-05-20 NOTE — Progress Notes (Signed)
HYDROTHERAPY TREATMENT     05/20/16 1300  Subjective Assessment  Subjective "i'm hurting"  Patient and Family Stated Goals for pt to get better  Prior Treatments wound care center  Evaluation and Treatment  Evaluation and Treatment Procedures Explained to Patient/Family Yes (pt and family)  Evaluation and Treatment Procedures agreed to;Patient unable to consent due to mental status (daughter present and agreeable)  Pressure Injury Unstageable Sacrum  Date First Assessed/Time First Assessed: 05/18/16 (by PT)   Location: Sacrum    Staging: Unstageable  Dressing Type Foam;Gauze  (Santyl; Saline Kerlix packing)  Dressing Changed (dressing had not been changed since hydro yesterday)  Dressing Change Frequency Twice a day  State of Healing Non-healing  Site / Wound Assessment Pink;Red;Bleeding  % Wound base Red or Granulating 40%  % Wound base Yellow/Fibrinous Exudate 60%  Peri-wound Assessment Pink;Excoriated  Margins Unattached edges (unapproximated)  Drainage Amount Copious (dressing had not been changed since hydro yesterday)  Drainage Description Odor;Serosanguineous  Treatment Debridement (Selective);Hydrotherapy (Pulse lavage)  Hydrotherapy  Pulsed lavage therapy - wound location sacrum  Pulsed Lavage with Suction (psi) 8 psi  Pulsed Lavage with Suction - Normal Saline Used 1000 mL  Pulsed Lavage Tip Tip with splash shield  Selective Debridement  Selective Debridement - Location sacrum  Selective Debridement - Tools Used Forceps;Scissors  Selective Debridement - Tissue Removed yellow/brown tissue  Wound Therapy - Assess/Plan/Recommendations  Wound Therapy - Clinical Statement 83 yo female admitted with sepsis. Pt was going to wound care center for wound care. Hx of dementia, chronic decubitus ulcers.  Factors Delaying/Impairing Wound Healing Diabetes Mellitus;Incontinence;Immobility;Multiple medical problems  Hydrotherapy Plan Debridement;Dressing change;Patient/family  education;Pulsatile lavage with suction  Wound Therapy - Frequency 6X / week  Wound Therapy - Current Recommendations Case manager/social work  Wound Therapy - Follow Up Recommendations Home health RN;Skilled nursing facility  Wound Plan Hydrotherapy and dressing changes 6x week to facilitate wound healing  Wound Therapy Goals - Improve the function of patient's integumentary system by progressing the wound(s) through the phases of wound healing by:  Decrease Necrotic Tissue - Progress Progressing toward goal  Increase Granulation Tissue - Progress Progressing toward goal  Goals/treatment plan/discharge plan were made with and agreed upon by patient/family Yes  Time For Goal Achievement 2 weeks  Wound Therapy - Potential for Goals Poor   Rebeca Alert, MPT [phone]

## 2016-05-20 NOTE — Progress Notes (Addendum)
Pt continues to be unable to void s/p 1000cc bolus. Bladder scan revealed >600 and >700 consecutively. Paged on call K. Schorr regarding findings. Order was given to place a foley for the patient. 750cc output s/p foley insertion.  Golden Hurter

## 2016-05-20 NOTE — Discharge Summary (Signed)
Physician Discharge Summary  Elizabeth Keller ZDG:[id number] DOB: 23-Feb-1923 DOA: 05/15/2016  PCP: Lemont Fillers., NP  Admit date: 05/15/2016 Discharge date: 05/20/2016  Admitted From: Home Disposition:  Home with Home Health PT/OT/RN  Recommendations for Outpatient Follow-up:  1. Follow up with PCP in 1-2 weeks 2. Folow up with the Wound Clinic at D/C for Hydrotherapy 3. Follow up with Urology at discharge for TOV and foley removal 4. Follow Wound Care Recc's for Santyl impregnated saline moistened kerlix roll gauze, top with dry dressings and secure. Change daily 5. Please obtain CMP/CBC in one week  Home Health: YES Equipment/Devices: Publishing rights manager  Discharge Condition: Stable CODE STATUS: DO NOT RESUSCITATE Diet recommendation: Regular  Brief/Interim Summary: 83 year old female with medical history significant for dementia, chronic nonhealing decubiti ulcer, DM, hypertension presented to the emergency department after being sent from the wound care center due to hypotension. She was symptomatic on presentation and denies any fever or chills. Patient daughter's report she had an unstageable decubitus ulcer that opened about 3-4 days ago with purulent drainage. Patient was admitted for suspected sepsis due to ulcer infection. Patient was started on IV antibiotic and wound care and General Surgery were consulted. Sacral x-ray does not show bony involvement.   Discharge Diagnoses:  Principal Problem:   Severe sepsis (HCC) Active Problems:   Chronic kidney disease, stage III (moderate)   Pressure ulcer of unspecified buttock, stage 2   DM2 (diabetes mellitus, type 2) (HCC)  Sepsis 2/2 to suspected infected/necrotic sacral ulcer s/p excisional debridement at bedside -Continued IV abx and then transitioned to oral abx Augmentin for completion purposes for 5 days total -Poor chance of healing -Surgical team appreciated - no need more debridement - follow up as outpatient at the wound  care center  -Wound care recommendations appreciated  -Sed rate elevated  -Sacral xray show no bony involvement  -Wound was cultured post abx but showed few Psuedomonas Aerguinosa that was pansenstive.  -Follow up blood cx NGTD at 5 days -Lactic acid wnl levels  Sacral decubitus ulcer unstageable  -Follow-up wound culture - few pseudomonas - will discuss with ID - Discussed with ID since patient is doing clinically better will continue Augmentin for x 5 days, unknown if this is a colonization. This was not a deep tissue culture or a culture in the OR.  -Follow up with Wound Clinic  Acute Urinary retention  -Initially retained >500 cc - foley cath was placed  -TOV yesterday and pateint still retained -Reinsterted foley and discussed with Urology Dr. Retta Diones and recommended leaving foley, no Flomax, and follow up as an outpatient   Yeast in Urine Cx  -Treated with Diflucan x 1   Poor appetite/Severe Protein Calorie Malnutrition -C/w Trail of Remeron 30 mg qHS  -C/w Ensure Enlive po TID supplements   Hypokalemia  -Resolved  -Repeat CMP as an outpatient  DM type 2  -SSI while in the hospital -Held metformin/Glipizide; Will restart Metformin and discontinue Glipizide  CKD stage 3 -Stable with BUN/Cr at 9/0.73  -Repeat CMP as an outpatient  Discharge Instructions  Discharge Instructions    Call MD for:    Complete by:  As directed    Call MD for:  difficulty breathing, headache or visual disturbances    Complete by:  As directed    Call MD for:  extreme fatigue    Complete by:  As directed    Call MD for:  hives    Complete by:  As directed  Call MD for:  persistant dizziness or light-headedness    Complete by:  As directed    Call MD for:  persistant nausea and vomiting    Complete by:  As directed    Call MD for:  redness, tenderness, or signs of infection (pain, swelling, redness, odor or green/yellow discharge around incision site)    Complete by:  As  directed    Call MD for:  severe uncontrolled pain    Complete by:  As directed    Call MD for:  temperature >100.4    Complete by:  As directed    Diet - low sodium heart healthy    Complete by:  As directed    Discharge instructions    Complete by:  As directed    Follow up with the Wound Clinic, PCP and Urology at Discharge. Take all medications as prescribed. If symptoms change or worsen please return to the ED for evaluation.   Increase activity slowly    Complete by:  As directed      Allergies as of 05/20/2016      Reactions   Dicyclomine Other (See Comments)   Reaction:  Dizziness    Gabapentin Other (See Comments)   Reaction:  Dizziness    Metformin And Related Nausea And Vomiting   Lyrica [pregabalin] Other (See Comments)   Reaction:  Unknown   Topiramate Other (See Comments)   Reaction:  Unknown      Medication List    STOP taking these medications   glipiZIDE 10 MG 24 hr tablet Commonly known as:  GLUCOTROL XL     TAKE these medications   acidophilus Caps capsule Take 1 capsule by mouth daily.   albuterol 108 (90 Base) MCG/ACT inhaler Commonly known as:  PROVENTIL HFA;VENTOLIN HFA Inhale 1-2 puffs into the lungs every 6 (six) hours as needed for wheezing or shortness of breath.   albuterol (2.5 MG/3ML) 0.083% nebulizer solution Commonly known as:  PROVENTIL Take 2.5 mg by nebulization every 6 (six) hours as needed for wheezing or shortness of breath.   amoxicillin-clavulanate 875-125 MG tablet Commonly known as:  AUGMENTIN Take 1 tablet by mouth every 12 (twelve) hours.   aspirin EC 81 MG tablet Take 81 mg by mouth daily.   bisacodyl 10 MG suppository Commonly known as:  DULCOLAX Place 1 suppository (10 mg total) rectally daily as needed for moderate constipation.   cholecalciferol 1000 units tablet Commonly known as:  VITAMIN D Take 1,000 Units by mouth daily.   clonazePAM 0.5 MG tablet Commonly known as:  KLONOPIN TAKE 1/2 TABLET BY MOUTH  DAILY IN THE MORNING, 1 TABLET AT 2PM, MAY TAKE 1/2 TABLET BETWEEN MORNING AND 2PM, THEN DELAY 2PM DOSE TO 7PM   collagenase ointment Commonly known as:  SANTYL Apply 1 application topically daily. Pt applies to wound on back.   diclofenac sodium 1 % Gel Commonly known as:  VOLTAREN Apply 2 g topically 4 (four) times daily as needed (for pain). Pt applies to back.   feeding supplement (ENSURE ENLIVE) Liqd Take 237 mLs by mouth 3 (three) times daily between meals.   megestrol 20 MG tablet Commonly known as:  MEGACE Take 1 tablet (20 mg total) by mouth daily.   metFORMIN 500 MG tablet Commonly known as:  GLUCOPHAGE Take 500 mg by mouth daily with breakfast.   multivitamin with minerals Tabs tablet Take 1 tablet by mouth daily. Start taking on:  05/21/2016   omeprazole 40 MG capsule Commonly known  as:  PRILOSEC Take 40 mg by mouth daily.   simvastatin 40 MG tablet Commonly known as:  ZOCOR Take 40 mg by mouth at bedtime.   torsemide 5 MG tablet Commonly known as:  DEMADEX Take 5 mg by mouth daily.            Durable Medical Equipment        Start     Ordered   05/19/16 1145  For home use only DME Other see comment  Once    Comments:  Low air loss mattress   05/19/16 1145     Follow-up Information    [facility] Follow up.   Specialty:  Home Health Services Why:  [facility] nursing,HH physical therapy,occupational therapy Contact information: [address] [place] [address] [phone]        [facility] Follow up.   Why:  low air loss mattress Contact information: [address] [place] [address] [phone]        Lemont Fillers., NP. Call in 1 week(s).   Specialty:  Internal Medicine Why:  Call to schedule appointment in 1 week Contact information: [address] [place] [address] [phone]        Chelsea Aus, MD. Call in 1 week(s).    Specialty:  Urology Why:  Call to schedule outpatient visit with Urology for Trial of Void and foley removal.  Contact information: [address] [place] [address] [phone]          Allergies  Allergen Reactions  . Dicyclomine Other (See Comments)    Reaction:  Dizziness   . Gabapentin Other (See Comments)    Reaction:  Dizziness   . Metformin And Related Nausea And Vomiting  . Lyrica [Pregabalin] Other (See Comments)    Reaction:  Unknown  . Topiramate Other (See Comments)    Reaction:  Unknown   Consultations: -General Surgery -Infectious Diseases via Telephone Conversation -Urology Dr. Retta Diones via Telephone Conversation   Procedures/Studies: Dg Sacrum/coccyx  Result Date: 05/16/2016 CLINICAL DATA:  Sacral decubitus ulcer EXAM: SACRUM AND COCCYX - 2+ VIEW COMPARISON:  04/03/2016 FINDINGS: Osteopenia and degenerative changes noted of the visualize sacrum, pelvis and hips. Sacral ala appear symmetric and intact. No displaced fracture or malalignment. On the lateral view, there is air within the superficial sacral soft tissues compatible with a decubitus ulcer. Underlying sacrum and coccyx appear intact. No definite acute osseous finding by plain radiography. Aortoiliac atherosclerosis noted. IMPRESSION: Sacral decubitus ulcer. Osteopenia and degenerative changes. No significant or acute osseous finding by plain radiography. Electronically Signed   By: Judie Petit.  Shick M.D.   On: 05/16/2016 09:47   Dg Chest Port 1 View  Result Date: 05/15/2016 CLINICAL DATA:  83 year old female with a history of hypotension. EXAM: PORTABLE CHEST 1 VIEW COMPARISON:  04/18/2014 FINDINGS: Cardiomediastinal silhouette unchanged in size and contour. Calcifications of the aortic arch. Similar appearance of reticular pattern of opacity involving the bilateral lungs, more pronounced in the upper lungs above the hila. Mixed nodular opacities. No new confluent airspace disease. No pleural effusion. No  pneumothorax. pleuroparenchymal thickening at the apices. No displaced fracture. IMPRESSION: Overall there is a similar background pattern of architectural distortion and chronic lung changes/interstitial disease in this patient with a history of sarcoid. Although there is no clear evidence of new airspace disease, a superimposed acute process cannot be excluded. Aortic atherosclerosis. Electronically Signed   By: Marijean Niemann  Loreta Ave D.O.   On: 05/15/2016 17:25   Subjective: Seen and examined and was doing well. Had no complaints and no N/V. Discussed with daughter and plan is to continue following up at Wound Clinic and follow up with Urology as an outpatient for TOV and foley removal. No other concerns or complaints.  Discharge Exam: Vitals:   05/19/16 2019 05/20/16 0404  BP: (!) 121/57 137/73  Pulse: 100 (!) 106  Resp: 19 20  Temp: 99.5 F (37.5 C) 97.4 F (36.3 C)   Vitals:   05/19/16 0600 05/19/16 1327 05/19/16 2019 05/20/16 0404  BP: 121/65 126/68 (!) 121/57 137/73  Pulse: 100 95 100 (!) 106  Resp: Temp: 99.1 F (37.3 C) 98.3 F (36.8 C) 99.5 F (37.5 C) 97.4 F (36.3 C)  TempSrc: Oral Oral Oral Axillary  SpO2: 98% 99% 96% 97%  Weight: 62.2 kg (137 lb 3.2 oz)     Height:       General: Pt is alert, not in acute distress Cardiovascular: RRR, S1/S2 +, no rubs, no gallops Respiratory: CTA bilaterally, no wheezing, no rhonchi Abdominal: Soft, NT, ND, bowel sounds + Extremities: no edema, no cyanosis; Sacral decubitus not able to be viewed at this time as patient did not turn.  The results of significant diagnostics from this hospitalization (including imaging, microbiology, ancillary and laboratory) are listed below for reference.    Microbiology: Recent Results (from the past 240 hour(s))  Blood Culture (routine x 2)     Status: None   Collection Time: 05/15/16  3:52 PM  Result Value Ref Range Status   Specimen Description BLOOD LEFT ANTECUBITAL  Final   Special  Requests   Final    BOTTLES DRAWN AEROBIC AND ANAEROBIC Blood Culture adequate volume   Culture   Final    NO GROWTH 5 DAYS Performed at [facility], [address], [place], [place] [address]    Report Status 05/20/2016 FINAL  Final  Blood Culture (routine x 2)     Status: None   Collection Time: 05/15/16  6:58 PM  Result Value Ref Range Status   Specimen Description BLOOD RIGHT ANTECUBITAL  Final   Special Requests IN PEDIATRIC BOTTLE Blood Culture adequate volume  Final   Culture   Final    NO GROWTH 5 DAYS Performed at [facility], [address], [place], [place] [address]    Report Status 05/20/2016 FINAL  Final  Urine culture     Status: Abnormal   Collection Time: 05/16/16 10:00 AM  Result Value Ref Range Status   Specimen Description URINE, CATHETERIZED  Final   Special Requests NONE  Final   Culture 60,000 COLONIES/mL YEAST (A)  Final   Report Status 05/18/2016 FINAL  Final  Aerobic Culture (superficial specimen)     Status: None (Preliminary result)   Collection Time: 05/16/16 10:20 AM  Result Value Ref Range Status   Specimen Description SACRAL  Final   Special Requests NONE  Final   Gram Stain   Final    DEGENERATED CELLULAR MATERIAL PRESENT MODERATE GRAM NEGATIVE COCCOBACILLI MODERATE GRAM POSITIVE COCCI IN PAIRS FEW GRAM POSITIVE RODS    Culture   Final    FEW PSEUDOMONAS AERUGINOSA RARE ENTEROCOCCUS FAECALIS SUSCEPTIBILITIES TO FOLLOW Performed at [facility], [address], [place], [place] [address]    Report Status PENDING  Incomplete   Organism ID, Bacteria PSEUDOMONAS AERUGINOSA  Final  Susceptibility   Pseudomonas aeruginosa - MIC*    CEFTAZIDIME 2 SENSITIVE Sensitive     CIPROFLOXACIN <=0.25 SENSITIVE Sensitive     GENTAMICIN <=1 SENSITIVE Sensitive     IMIPENEM <=0.25 SENSITIVE Sensitive     PIP/TAZO 8 SENSITIVE Sensitive     CEFEPIME <=1 SENSITIVE Sensitive     * FEW PSEUDOMONAS AERUGINOSA    Labs: BNP (last 3  results)  Recent Labs  12/19/15 1100 03/14/16 1716  BNP 155.0* 197.2*   Basic Metabolic Panel:  Recent Labs Lab 05/16/16 0128 05/17/16 0535 05/18/16 0535 05/19/16 0607 05/20/16 0549  NA 143 144 146* 141 140  K 3.5 3.6 3.1* 4.5 4.6  CL 112* 116* 117* 114* 112*  CO2 21* 22  GLUCOSE 81 126* 49* 121* 140*  BUN CREATININE 0.80 0.74 0.73 0.70 0.77  0.73  CALCIUM 8.3* 8.1* 8.2* 8.2* 8.2*  MG  --   --   --  1.7  --    Liver Function Tests:  Recent Labs Lab 05/15/16 1552 05/15/16 2243 05/16/16 0128 05/20/16 0549  AST ALT 12* 12* 11* 13*  ALKPHOS 73 64 58 108  BILITOT 1.0 1.1 0.9 0.8  PROT 6.8 6.0* 5.7* 5.2*  ALBUMIN 2.7* 2.4* 2.2* 2.0*   No results for input(s): LIPASE, AMYLASE in the last 168 hours. No results for input(s): AMMONIA in the last 168 hours. CBC:  Recent Labs Lab 05/15/16 1552 05/15/16 2243 05/16/16 0128 05/17/16 0535 05/19/16 0607 05/20/16 0549  WBC 14.3* 12.2* 9.9 10.0 9.4 9.0  NEUTROABS 11.4* 9.6*  --  7.7  --   --   HGB 10.2* 9.8* 9.0* 8.4* 8.2* 8.1*  HCT 30.8* 29.8* 27.5* 25.8* 25.9* 25.9*  MCV 89.8 89.8 89.6 90.2 88.4 89.0  PLT 415* 350 354 331 397 397   Cardiac Enzymes:  Recent Labs Lab 05/15/16 2243  TROPONINI <0.03   BNP: Invalid input(s): POCBNP CBG:  Recent Labs Lab 05/19/16 0721 05/19/16 1130 05/19/16 1723 05/19/16 2121 05/20/16 0731  GLUCAP 124* 110* 103* 136* 127*   D-Dimer No results for input(s): DDIMER in the last 72 hours. Hgb A1c No results for input(s): HGBA1C in the last 72 hours. Lipid Profile No results for input(s): CHOL, HDL, LDLCALC, TRIG, CHOLHDL, LDLDIRECT in the last 72 hours. Thyroid function studies No results for input(s): TSH, T4TOTAL, T3FREE, THYROIDAB in the last 72 hours.  Invalid input(s): FREET3 Anemia work up  Recent Labs  05/19/16 0857  VITAMINB12 563  FOLATE 10.4  FERRITIN 382*  TIBC 98*  IRON 14*  RETICCTPCT 1.7   Urinalysis     Component Value Date/Time   COLORURINE AMBER (A) 05/16/2016 1000   APPEARANCEUR CLEAR 05/16/2016 1000   LABSPEC 1.019 05/16/2016 1000   PHURINE 5.0 05/16/2016 1000   GLUCOSEU NEGATIVE 05/16/2016 1000   HGBUR NEGATIVE 05/16/2016 1000   BILIRUBINUR NEGATIVE 05/16/2016 1000   KETONESUR NEGATIVE 05/16/2016 1000   PROTEINUR NEGATIVE 05/16/2016 1000   UROBILINOGEN 0.2 10/18/2014 2310   NITRITE NEGATIVE 05/16/2016 1000   LEUKOCYTESUR NEGATIVE 05/16/2016 1000   Sepsis Labs Invalid input(s): PROCALCITONIN,  WBC,  LACTICIDVEN Microbiology Recent Results (from the past 240 hour(s))  Blood Culture (routine x 2)     Status: None   Collection Time: 05/15/16  3:52 PM  Result Value Ref Range Status   Specimen Description BLOOD LEFT ANTECUBITAL  Final   Special Requests   Final    BOTTLES  DRAWN AEROBIC AND ANAEROBIC Blood Culture adequate volume   Culture   Final    NO GROWTH 5 DAYS Performed at [facility], [address] [place], [place] [address]    Report Status 05/20/2016 FINAL  Final  Blood Culture (routine x 2)     Status: None   Collection Time: 05/15/16  6:58 PM  Result Value Ref Range Status   Specimen Description BLOOD RIGHT ANTECUBITAL  Final   Special Requests IN PEDIATRIC BOTTLE Blood Culture adequate volume  Final   Culture   Final    NO GROWTH 5 DAYS Performed at [facility], [address], [place], [place] [address]    Report Status 05/20/2016 FINAL  Final  Urine culture     Status: Abnormal   Collection Time: 05/16/16 10:00 AM  Result Value Ref Range Status   Specimen Description URINE, CATHETERIZED  Final   Special Requests NONE  Final   Culture 60,000 COLONIES/mL YEAST (A)  Final   Report Status 05/18/2016 FINAL  Final  Aerobic Culture (superficial specimen)     Status: None (Preliminary result)   Collection Time: 05/16/16 10:20 AM  Result Value Ref Range Status   Specimen Description SACRAL  Final   Special Requests NONE  Final   Gram Stain    Final    DEGENERATED CELLULAR MATERIAL PRESENT MODERATE GRAM NEGATIVE COCCOBACILLI MODERATE GRAM POSITIVE COCCI IN PAIRS FEW GRAM POSITIVE RODS    Culture   Final    FEW PSEUDOMONAS AERUGINOSA RARE ENTEROCOCCUS FAECALIS SUSCEPTIBILITIES TO FOLLOW Performed at [facility], [address], [place], [place] [address]    Report Status PENDING  Incomplete   Organism ID, Bacteria PSEUDOMONAS AERUGINOSA  Final      Susceptibility   Pseudomonas aeruginosa - MIC*    CEFTAZIDIME 2 SENSITIVE Sensitive     CIPROFLOXACIN <=0.25 SENSITIVE Sensitive     GENTAMICIN <=1 SENSITIVE Sensitive     IMIPENEM <=0.25 SENSITIVE Sensitive     PIP/TAZO 8 SENSITIVE Sensitive     CEFEPIME <=1 SENSITIVE Sensitive     * FEW PSEUDOMONAS AERUGINOSA   Time coordinating discharge: 40 minutes  SIGNED:  Merlene Laughter, DO [facility] 05/20/2016, 12:29 PM Pager [phone]  If 7PM-7AM, please contact night-coverage [email or web address] Password [email or web address]

## 2016-05-21 ENCOUNTER — Telehealth: Payer: Self-pay | Admitting: Behavioral Health

## 2016-05-21 DIAGNOSIS — L97418 Non-pressure chronic ulcer of right heel and midfoot with other specified severity: Secondary | ICD-10-CM | POA: Diagnosis not present

## 2016-05-21 DIAGNOSIS — L89150 Pressure ulcer of sacral region, unstageable: Secondary | ICD-10-CM | POA: Diagnosis not present

## 2016-05-21 DIAGNOSIS — E11621 Type 2 diabetes mellitus with foot ulcer: Secondary | ICD-10-CM | POA: Diagnosis not present

## 2016-05-21 DIAGNOSIS — L89310 Pressure ulcer of right buttock, unstageable: Secondary | ICD-10-CM | POA: Diagnosis not present

## 2016-05-21 DIAGNOSIS — L97512 Non-pressure chronic ulcer of other part of right foot with fat layer exposed: Secondary | ICD-10-CM | POA: Diagnosis not present

## 2016-05-21 DIAGNOSIS — L97428 Non-pressure chronic ulcer of left heel and midfoot with other specified severity: Secondary | ICD-10-CM | POA: Diagnosis not present

## 2016-05-21 LAB — AEROBIC CULTURE W GRAM STAIN (SUPERFICIAL SPECIMEN)

## 2016-05-21 LAB — AEROBIC CULTURE  (SUPERFICIAL SPECIMEN)

## 2016-05-21 NOTE — Telephone Encounter (Signed)
Appointment scheduled for TCM/Hospital Follow-up on 05/26/16 at 8:00 AM.

## 2016-05-21 NOTE — Telephone Encounter (Signed)
Transition Care Management Follow-up Telephone Call   PCP: Lemont Fillers., NP  Admit date: 05/15/2016 Discharge date: 05/20/2016  Admitted From: Home Disposition:  Home with Home Health PT/OT/RN  Recommendations for Outpatient Follow-up:  1. Follow up with PCP in 1-2 weeks 2. Folow up with the Wound Clinic at D/C for Hydrotherapy 3. Follow up with Urology at discharge for TOV and foley removal 4. Follow Wound Care Recc's for Santyl impregnated saline moistened kerlix roll gauze, top with dry dressings and secure. Change daily 5. Please obtain CMP/CBC in one week  Home Health: YES Equipment/Devices: Publishing rights manager  Discharge Condition: Stable   How have you been since you were released from the hospital? Per patient's daughter, "she's doing okay".   Do you understand why you were in the hospital? yes   Do you understand the discharge instructions? yes   Where were you discharged to? Home   Items Reviewed:  Medications reviewed: yes  Allergies reviewed: yes  Dietary changes reviewed: yes, regular diet  Referrals reviewed: yes, Follow up with PCP in 1-2 weeks; Folow up with the Wound Clinic at D/C for Hydrotherapy; Follow up with Urology at discharge for TOV and foley removal; Follow Wound Care Recc's for Santyl impregnated saline moistened kerlix roll gauze, top with dry dressings and secure. Change daily     Functional Questionnaire:   Activities of Daily Living (ADLs):   She states they are independent in the following: None States they require assistance with the following: ambulation, bathing and hygiene, feeding, continence, grooming, toileting and dressing   Any transportation issues/concerns?: no   Any patient concerns? no   Confirmed importance and date/time of follow-up visits scheduled yes, 05/26/16 at 8:00 AM  Provider Appointment booked with Sandford Craze, NP.  Confirmed with patient if condition begins to worsen call PCP or go to  the ER.  Patient was given the office number and encouraged to call back with question or concerns.  : yes

## 2016-05-25 ENCOUNTER — Telehealth: Payer: Self-pay | Admitting: Family

## 2016-05-25 DIAGNOSIS — E11621 Type 2 diabetes mellitus with foot ulcer: Secondary | ICD-10-CM | POA: Diagnosis not present

## 2016-05-25 DIAGNOSIS — L97418 Non-pressure chronic ulcer of right heel and midfoot with other specified severity: Secondary | ICD-10-CM | POA: Diagnosis not present

## 2016-05-25 DIAGNOSIS — L89150 Pressure ulcer of sacral region, unstageable: Secondary | ICD-10-CM | POA: Diagnosis not present

## 2016-05-25 DIAGNOSIS — L97512 Non-pressure chronic ulcer of other part of right foot with fat layer exposed: Secondary | ICD-10-CM | POA: Diagnosis not present

## 2016-05-25 DIAGNOSIS — L89310 Pressure ulcer of right buttock, unstageable: Secondary | ICD-10-CM | POA: Diagnosis not present

## 2016-05-25 DIAGNOSIS — L97428 Non-pressure chronic ulcer of left heel and midfoot with other specified severity: Secondary | ICD-10-CM | POA: Diagnosis not present

## 2016-05-25 NOTE — Telephone Encounter (Signed)
Caller name: Marchelle Folks Relationship to patient: [facility] Can be reached: [phone] Pharmacy:  Reason for call: Request verbal orders to resume HH for 1 x a week for 4 weeks

## 2016-05-25 NOTE — Telephone Encounter (Signed)
Left detailed message on Elizabeth Keller's voicemail giving verbal auth to proceed with home health as below and to call if any further questions.

## 2016-05-26 ENCOUNTER — Ambulatory Visit (INDEPENDENT_AMBULATORY_CARE_PROVIDER_SITE_OTHER): Payer: Medicare Other | Admitting: Family

## 2016-05-26 ENCOUNTER — Encounter: Payer: Self-pay | Admitting: Family

## 2016-05-26 VITALS — BP 125/73 | HR 115 | Resp 16 | Ht 67.0 in

## 2016-05-26 DIAGNOSIS — Z8619 Personal history of other infectious and parasitic diseases: Secondary | ICD-10-CM

## 2016-05-26 DIAGNOSIS — L89150 Pressure ulcer of sacral region, unstageable: Secondary | ICD-10-CM | POA: Diagnosis not present

## 2016-05-26 DIAGNOSIS — L89159 Pressure ulcer of sacral region, unspecified stage: Secondary | ICD-10-CM | POA: Diagnosis not present

## 2016-05-26 DIAGNOSIS — L89310 Pressure ulcer of right buttock, unstageable: Secondary | ICD-10-CM | POA: Diagnosis not present

## 2016-05-26 DIAGNOSIS — E46 Unspecified protein-calorie malnutrition: Secondary | ICD-10-CM | POA: Diagnosis not present

## 2016-05-26 DIAGNOSIS — L97428 Non-pressure chronic ulcer of left heel and midfoot with other specified severity: Secondary | ICD-10-CM | POA: Diagnosis not present

## 2016-05-26 DIAGNOSIS — L97418 Non-pressure chronic ulcer of right heel and midfoot with other specified severity: Secondary | ICD-10-CM | POA: Diagnosis not present

## 2016-05-26 DIAGNOSIS — A4152 Sepsis due to Pseudomonas: Secondary | ICD-10-CM

## 2016-05-26 DIAGNOSIS — R339 Retention of urine, unspecified: Secondary | ICD-10-CM

## 2016-05-26 DIAGNOSIS — L97512 Non-pressure chronic ulcer of other part of right foot with fat layer exposed: Secondary | ICD-10-CM | POA: Diagnosis not present

## 2016-05-26 DIAGNOSIS — E11621 Type 2 diabetes mellitus with foot ulcer: Secondary | ICD-10-CM | POA: Diagnosis not present

## 2016-05-26 LAB — COMPREHENSIVE METABOLIC PANEL
ALT: 10 U/L (ref 0–35)
AST: 21 U/L (ref 0–37)
Albumin: 2.8 g/dL — ABNORMAL LOW (ref 3.5–5.2)
Alkaline Phosphatase: 83 U/L (ref 39–117)
BILIRUBIN TOTAL: 0.4 mg/dL (ref 0.2–1.2)
BUN: 12 mg/dL (ref 6–23)
CALCIUM: 9.4 mg/dL (ref 8.4–10.5)
CHLORIDE: 101 meq/L (ref 96–112)
CO2: 26 meq/L (ref 19–32)
Creatinine, Ser: 0.69 mg/dL (ref 0.40–1.20)
GFR: 102.64 mL/min (ref >60.00)
Glucose, Bld: 144 mg/dL — ABNORMAL HIGH (ref 70–99)
Potassium: 3.7 mEq/L (ref 3.5–5.1)
Sodium: 138 mEq/L (ref 135–145)
Total Protein: 6.1 g/dL (ref 6.0–8.3)

## 2016-05-26 LAB — CBC WITH DIFFERENTIAL/PLATELET
BASOS ABS: 0.1 10*3/uL (ref 0.0–0.1)
Basophils Relative: 1.1 % (ref 0.0–3.0)
Eosinophils Absolute: 0.3 10*3/uL (ref 0.0–0.7)
Eosinophils Relative: 3.5 % (ref 0.0–5.0)
HCT: 28.8 % — ABNORMAL LOW (ref 36.0–46.0)
Hemoglobin: 9.2 g/dL — ABNORMAL LOW (ref 12.0–15.0)
LYMPHS PCT: 12.3 % (ref 12.0–46.0)
Lymphs Abs: 0.9 10*3/uL (ref 0.7–4.0)
MCHC: 32 g/dL (ref 30.0–36.0)
MCV: 90.1 fl (ref 78.0–100.0)
Monocytes Absolute: 0.7 10*3/uL (ref 0.1–1.0)
Monocytes Relative: 9.5 % (ref 3.0–12.0)
NEUTROS ABS: 5.4 10*3/uL (ref 1.4–7.7)
NEUTROS PCT: 73.6 % (ref 43.0–77.0)
PLATELETS: 468 10*3/uL — AB (ref 150.0–400.0)
RBC: 3.2 Mil/uL — ABNORMAL LOW (ref 3.87–5.11)
RDW: 17.7 % — AB (ref 11.5–15.5)
WBC: 7.4 10*3/uL (ref 4.0–10.5)

## 2016-05-26 MED ORDER — DICLOFENAC SODIUM 1 % TD GEL: 2.0000 g | Freq: Four times a day (QID) | TRANSDERMAL | 0 refills | Status: DC | PRN

## 2016-05-26 NOTE — Patient Instructions (Signed)
Please complete lab work prior to leaving. Continue wound care. Schedule a follow up appointment with the wound care clinic.

## 2016-05-26 NOTE — Progress Notes (Signed)
Pre visit review using our clinic review tool, if applicable. No additional management support is needed unless otherwise documented below in the visit note. 

## 2016-05-26 NOTE — Progress Notes (Signed)
Subjective:    Patient ID: Elizabeth Keller, female    DOB: 1923-04-01, 83 y.o.   MRN: [id number]  HPI  Elizabeth Keller is a 83 yr old female who presents today for hospital follow up.she was hospitalized 4/6-4/11 due to Severe sepsis thought to be secondary to sacral decubitous ulcer.  During her hospitalization she underwent excisional debridement of her sacral ulcer. She grew pseudomonas that was pansensitive in her wound culture.  Blood cultures during her hospitalization were negative. She was discharged with an additional 5 days of augmentin.    She had a foley placed during her hospitalization due to urinary retention and urology was consulted. (Dr. Retta Diones). PT and RN coming today.   Protein/calorie malnutrition- was given rx for remeron at bedtime and continued on ensure enlive supplements.     Review of Systems See HPI  Past Medical History:  Diagnosis Date  . Arthritis   . Asthma   . Degenerative disk disease   . Diabetes mellitus   . GERD (gastroesophageal reflux disease)   . Hypercholesteremia   . Hypertension   . IBS (irritable bowel syndrome)   . Sarcoidosis   . Spinal stenosis      Social History   Social History  . Marital status: Divorced    Spouse name: N/A  . Number of children: N/A  . Years of education: N/A   Occupational History  . Not on file.   Social History Main Topics  . Smoking status: Former Games developer  . Smokeless tobacco: Never Used  . Alcohol use No  . Drug use: No  . Sexual activity: Not on file   Other Topics Concern  . Not on file   Social History Narrative  . No narrative on file    Past Surgical History:  Procedure Laterality Date  . EYE SURGERY  2017   cataract / glaucoma    Family History  Problem Relation Age of Onset  . Hypertension Other     Allergies  Allergen Reactions  . Dicyclomine Other (See Comments)    Reaction:  Dizziness   . Gabapentin Other (See Comments)    Reaction:  Dizziness   . Metformin And  Related Nausea And Vomiting  . Lyrica [Pregabalin] Other (See Comments)    Reaction:  Unknown  . Topiramate Other (See Comments)    Reaction:  Unknown    Current Outpatient Prescriptions on File Prior to Visit  Medication Sig Dispense Refill  . acidophilus (RISAQUAD) CAPS capsule Take 1 capsule by mouth daily.    Marland Kitchen albuterol (PROVENTIL HFA;VENTOLIN HFA) 108 (90 Base) MCG/ACT inhaler Inhale 1-2 puffs into the lungs every 6 (six) hours as needed for wheezing or shortness of breath.     Marland Kitchen albuterol (PROVENTIL) (2.5 MG/3ML) 0.083% nebulizer solution Take 2.5 mg by nebulization every 6 (six) hours as needed for wheezing or shortness of breath.    Marland Kitchen aspirin EC 81 MG tablet Take 81 mg by mouth daily.    . bisacodyl (DULCOLAX) 10 MG suppository Place 1 suppository (10 mg total) rectally daily as needed for moderate constipation. 12 suppository 0  . cholecalciferol (VITAMIN D) 1000 units tablet Take 1,000 Units by mouth daily.    . clonazePAM (KLONOPIN) 0.5 MG tablet TAKE 1/2 TABLET BY MOUTH DAILY IN THE MORNING, 1 TABLET AT 2PM, MAY TAKE 1/2 TABLET BETWEEN MORNING AND 2PM, THEN DELAY 2PM DOSE TO 7PM 60 tablet 0  . collagenase (SANTYL) ointment Apply 1 application topically daily. Pt applies to wound  on back.    . feeding supplement, ENSURE ENLIVE, (ENSURE ENLIVE) LIQD Take 237 mLs by mouth 3 (three) times daily between meals. 237 mL 12  . megestrol (MEGACE) 20 MG tablet Take 1 tablet (20 mg total) by mouth daily. 30 tablet 0  . metFORMIN (GLUCOPHAGE) 500 MG tablet Take 500 mg by mouth daily with breakfast.     . Multiple Vitamin (MULTIVITAMIN WITH MINERALS) TABS tablet Take 1 tablet by mouth daily. 30 tablet 0  . omeprazole (PRILOSEC) 40 MG capsule Take 40 mg by mouth daily.     . simvastatin (ZOCOR) 40 MG tablet Take 40 mg by mouth at bedtime.     . torsemide (DEMADEX) 5 MG tablet Take 5 mg by mouth daily.     No current facility-administered medications on file prior to visit.     BP 125/73 (BP  Location: Right Arm, Cuff Size: Normal)   Pulse (!) 115   Resp 16   Ht  (1.702 m)   SpO2 99% Comment: room air      Objective:   Physical Exam  Constitutional: She appears well-developed and well-nourished.  Patient continues to call out repeatedly  Cardiovascular: Normal rate, regular rhythm and normal heart sounds.   No murmur heard. Pulmonary/Chest: Effort normal and breath sounds normal. No respiratory distress. She has no wheezes.  Musculoskeletal: She exhibits no edema.  Neurological: She is alert.  confused  Skin:  Sacral dressing, intact  Psychiatric: She has a normal mood and affect. Her behavior is normal. Judgment and thought content normal.          Assessment & Plan:  Sepsis due to pseudomonas- resolved.   Sacral decubitus Ulcer- Management per wound clinic.    Protein Calorie malnutrition- Daughter notes improved appetite- on remeron and megace. Discussed importance of adequate protein in diet for wound healing.   Urinary retention- has foley- management per urology.

## 2016-05-27 ENCOUNTER — Encounter (HOSPITAL_COMMUNITY): Payer: Self-pay | Admitting: Emergency Medicine

## 2016-05-27 ENCOUNTER — Emergency Department (HOSPITAL_COMMUNITY)
Admission: EM | Admit: 2016-05-27 | Discharge: 2016-05-27 | Disposition: A | Discharge status: Home / Self Care | Payer: Medicare Other | Attending: Emergency Medicine | Admitting: Emergency Medicine

## 2016-05-27 ENCOUNTER — Other Ambulatory Visit: Payer: Self-pay

## 2016-05-27 ENCOUNTER — Telehealth: Payer: Self-pay | Admitting: Family

## 2016-05-27 ENCOUNTER — Encounter: Payer: Self-pay | Admitting: Family

## 2016-05-27 DIAGNOSIS — E119 Type 2 diabetes mellitus without complications: Secondary | ICD-10-CM | POA: Insufficient documentation

## 2016-05-27 DIAGNOSIS — Z7982 Long term (current) use of aspirin: Secondary | ICD-10-CM | POA: Insufficient documentation

## 2016-05-27 DIAGNOSIS — R338 Other retention of urine: Secondary | ICD-10-CM | POA: Diagnosis not present

## 2016-05-27 DIAGNOSIS — R531 Weakness: Secondary | ICD-10-CM | POA: Insufficient documentation

## 2016-05-27 DIAGNOSIS — I10 Essential (primary) hypertension: Secondary | ICD-10-CM | POA: Insufficient documentation

## 2016-05-27 DIAGNOSIS — Z7984 Long term (current) use of oral hypoglycemic drugs: Secondary | ICD-10-CM | POA: Diagnosis not present

## 2016-05-27 DIAGNOSIS — J45909 Unspecified asthma, uncomplicated: Secondary | ICD-10-CM | POA: Diagnosis not present

## 2016-05-27 DIAGNOSIS — R030 Elevated blood-pressure reading, without diagnosis of hypertension: Secondary | ICD-10-CM | POA: Diagnosis not present

## 2016-05-27 DIAGNOSIS — M5489 Other dorsalgia: Secondary | ICD-10-CM | POA: Diagnosis not present

## 2016-05-27 DIAGNOSIS — Z87891 Personal history of nicotine dependence: Secondary | ICD-10-CM | POA: Insufficient documentation

## 2016-05-27 DIAGNOSIS — Z79899 Other long term (current) drug therapy: Secondary | ICD-10-CM | POA: Insufficient documentation

## 2016-05-27 DIAGNOSIS — I959 Hypotension, unspecified: Secondary | ICD-10-CM | POA: Diagnosis not present

## 2016-05-27 DIAGNOSIS — F0390 Unspecified dementia without behavioral disturbance: Secondary | ICD-10-CM | POA: Diagnosis present

## 2016-05-27 LAB — COMPREHENSIVE METABOLIC PANEL
ALBUMIN: 2.7 g/dL — AB (ref 3.5–5.0)
ALK PHOS: 82 U/L (ref 38–126)
ALT: 13 U/L — AB (ref 14–54)
AST: 29 U/L (ref 15–41)
Anion gap: 11 (ref 5–15)
BILIRUBIN TOTAL: 0.7 mg/dL (ref 0.3–1.2)
BUN: 14 mg/dL (ref 6–20)
CALCIUM: 9.3 mg/dL (ref 8.9–10.3)
CO2: 26 mmol/L (ref 22–32)
CREATININE: 0.79 mg/dL (ref 0.44–1.00)
Chloride: 103 mmol/L (ref 101–111)
GFR calc Af Amer: >60 mL/min (ref >60)
GLUCOSE: 134 mg/dL — AB (ref 65–99)
Potassium: 4 mmol/L (ref 3.5–5.1)
Sodium: 140 mmol/L (ref 135–145)
TOTAL PROTEIN: 6.7 g/dL (ref 6.5–8.1)

## 2016-05-27 LAB — CBC WITH DIFFERENTIAL/PLATELET
BASOS ABS: 0.1 10*3/uL (ref 0.0–0.1)
BASOS PCT: 1 %
EOS ABS: 0.2 10*3/uL (ref 0.0–0.7)
EOS PCT: 2 %
HCT: 28.4 % — ABNORMAL LOW (ref 36.0–46.0)
Hemoglobin: 9.4 g/dL — ABNORMAL LOW (ref 12.0–15.0)
LYMPHS PCT: 9 %
Lymphs Abs: 0.8 10*3/uL (ref 0.7–4.0)
MCH: 29 pg (ref 26.0–34.0)
MCHC: 33.1 g/dL (ref 30.0–36.0)
MCV: 87.7 fL (ref 78.0–100.0)
MONO ABS: 0.9 10*3/uL (ref 0.1–1.0)
Monocytes Relative: 10 %
Neutro Abs: 7.1 10*3/uL (ref 1.7–7.7)
Neutrophils Relative %: 78 %
Platelets: 458 10*3/uL — ABNORMAL HIGH (ref 150–400)
RBC: 3.24 MIL/uL — ABNORMAL LOW (ref 3.87–5.11)
RDW: 16.8 % — AB (ref 11.5–15.5)
WBC: 8.9 10*3/uL (ref 4.0–10.5)

## 2016-05-27 LAB — URINALYSIS, ROUTINE W REFLEX MICROSCOPIC
Bilirubin Urine: NEGATIVE
GLUCOSE, UA: NEGATIVE mg/dL
Hgb urine dipstick: NEGATIVE
Ketones, ur: NEGATIVE mg/dL
NITRITE: NEGATIVE
PROTEIN: 30 mg/dL — AB
SPECIFIC GRAVITY, URINE: 1.019 (ref 1.005–1.030)
pH: 7 (ref 5.0–8.0)

## 2016-05-27 LAB — I-STAT CG4 LACTIC ACID, ED: LACTIC ACID, VENOUS: 1.59 mmol/L (ref 0.5–1.9)

## 2016-05-27 MED ORDER — ACETAMINOPHEN 325 MG PO TABS
650.0000 mg | ORAL_TABLET | Freq: Once | ORAL | Status: AC
  Administered 2016-05-27: 650 mg via ORAL
  Filled 2016-05-27: qty 2

## 2016-05-27 MED ORDER — SODIUM CHLORIDE 0.9 % IV SOLN: INTRAVENOUS | Status: DC

## 2016-05-27 NOTE — ED Notes (Signed)
RN attempted two IVs (one on the left hand and one in the left forearm) unsuccessfully.

## 2016-05-27 NOTE — ED Provider Notes (Signed)
[facility]-EMERGENCY DEPT Provider Note   CSN: [id number] Arrival date & time: 05/27/16  1140     History   Chief Complaint Chief Complaint  Patient presents with  . Hypotension    HPI Elizabeth Keller is a 83 y.o. female.  HPI Patient presents from the urology office due to staff concerns of hypotension. Here the patient is awake and alert, requesting Tylenol for pain control, though she does not specify what hurts. Patient has dementia, level V caveat. Per report, from family members, EMR, staff notes, the patient had recent hospitalization for infection of her decubitus ulcer, but since discharge has been doing better, and family notes that on the most recent wound evaluation earlier today the sacral decubitus ulcer was improved. No report of new fever, change in behavior, or other notable events. Today the patient went to scheduled outpatient urology follow-up due to her Foley catheter. Patient was found have one blood pressure reading low, was sent here for evaluation.   Past Medical History:  Diagnosis Date  . Arthritis   . Asthma   . Degenerative disk disease   . Diabetes mellitus   . GERD (gastroesophageal reflux disease)   . Hypercholesteremia   . Hypertension   . IBS (irritable bowel syndrome)   . Sarcoidosis   . Spinal stenosis     Patient Active Problem List   Diagnosis Date Noted  . Severe sepsis (HCC) 05/15/2016  . Pressure ulcer of unspecified buttock, stage 2 04/04/2016  . Generalized abdominal pain 04/04/2016  . DM2 (diabetes mellitus, type 2) (HCC) 04/04/2016  . Hyperkalemia 04/03/2016  . Chronic kidney disease, stage III (moderate) 03/14/2016    Past Surgical History:  Procedure Laterality Date  . EYE SURGERY  2017   cataract / glaucoma    OB History    No data available       Home Medications    Prior to Admission medications   Medication Sig Start Date End Date Taking? Authorizing Provider  acidophilus (RISAQUAD) CAPS capsule Take 1  capsule by mouth daily.    Historical Provider, MD  albuterol (PROVENTIL HFA;VENTOLIN HFA) 108 (90 Base) MCG/ACT inhaler Inhale 1-2 puffs into the lungs every 6 (six) hours as needed for wheezing or shortness of breath.     Historical Provider, MD  albuterol (PROVENTIL) (2.5 MG/3ML) 0.083% nebulizer solution Take 2.5 mg by nebulization every 6 (six) hours as needed for wheezing or shortness of breath.    Historical Provider, MD  aspirin EC 81 MG tablet Take 81 mg by mouth daily.    Historical Provider, MD  bisacodyl (DULCOLAX) 10 MG suppository Place 1 suppository (10 mg total) rectally daily as needed for moderate constipation. 05/20/16   Merlene Laughter, DO  cholecalciferol (VITAMIN D) 1000 units tablet Take 1,000 Units by mouth daily.    Historical Provider, MD  clonazePAM (KLONOPIN) 0.5 MG tablet TAKE 1/2 TABLET BY MOUTH DAILY IN THE MORNING, 1 TABLET AT 2PM, MAY TAKE 1/2 TABLET BETWEEN MORNING AND 2PM, THEN DELAY 2PM DOSE TO 7PM 05/11/16   Sharlene Dory, DO  collagenase (SANTYL) ointment Apply 1 application topically daily. Pt applies to wound on back.    Historical Provider, MD  diclofenac sodium (VOLTAREN) 1 % GEL Apply 2 g topically 4 (four) times daily as needed (for pain). Pt applies to back. 05/26/16   Sandford Craze, NP  feeding supplement, ENSURE ENLIVE, (ENSURE ENLIVE) LIQD Take 237 mLs by mouth 3 (three) times daily between meals. 05/20/16   Heloise Beecham  Sheikh, DO  megestrol (MEGACE) 20 MG tablet Take 1 tablet (20 mg total) by mouth daily. 04/03/16   Sandford Craze, NP  metFORMIN (GLUCOPHAGE) 500 MG tablet Take 500 mg by mouth daily with breakfast.     Historical Provider, MD  Multiple Vitamin (MULTIVITAMIN WITH MINERALS) TABS tablet Take 1 tablet by mouth daily. 05/21/16   Omair Latif Sheikh, DO  omeprazole (PRILOSEC) 40 MG capsule Take 40 mg by mouth daily.     Historical Provider, MD  simvastatin (ZOCOR) 40 MG tablet Take 40 mg by mouth at bedtime.     Historical  Provider, MD  torsemide (DEMADEX) 5 MG tablet Take 5 mg by mouth daily.    Historical Provider, MD    Family History Family History  Problem Relation Age of Onset  . Hypertension Other     Social History Social History  Substance Use Topics  . Smoking status: Former Games developer  . Smokeless tobacco: Never Used  . Alcohol use No     Allergies   Dicyclomine; Gabapentin; Metformin and related; Lyrica [pregabalin]; and Topiramate   Review of Systems Review of Systems  Unable to perform ROS: Dementia     Physical Exam Updated Vital Signs BP 134/82 (BP Location: Left Arm)   Pulse (!) 106   Temp 97.4 F (36.3 C) (Oral)   Resp 19   Ht  (1.702 m)   Wt 130 lb (59 kg)   SpO2 98%   BMI 20.36 kg/m   Physical Exam  Constitutional: She has a sickly appearance. No distress.  HENT:  Head: Normocephalic and atraumatic.  Eyes: Conjunctivae and EOM are normal.  Cardiovascular: Normal rate and regular rhythm.   Pulmonary/Chest: Effort normal and breath sounds normal. No stridor. No respiratory distress.  Abdominal: She exhibits no distension.  Genitourinary:  Genitourinary Comments: Foley catheter draining yellow fluid  Musculoskeletal: She exhibits no edema.  Neurological: She is alert. No cranial nerve deficit.  Patient moves all extremity spontaneously, has speech that is brief, clear, appropriate, though she is disoriented.  Skin: Skin is warm and dry.  Psychiatric: She has a normal mood and affect. Cognition and memory are impaired.  Nursing note and vitals reviewed.    ED Treatments / Results  Labs (all labs ordered are listed, but only abnormal results are displayed) Labs Reviewed  COMPREHENSIVE METABOLIC PANEL - Abnormal; Notable for the following:       Result Value   Glucose, Bld 134 (*)    Albumin 2.7 (*)    ALT 13 (*)    All other components within normal limits  CBC WITH DIFFERENTIAL/PLATELET - Abnormal; Notable for the following:    RBC 3.24 (*)     Hemoglobin 9.4 (*)    HCT 28.4 (*)    RDW 16.8 (*)    Platelets 458 (*)    All other components within normal limits  URINALYSIS, ROUTINE W REFLEX MICROSCOPIC - Abnormal; Notable for the following:    APPearance HAZY (*)    Protein, ur 30 (*)    Leukocytes, UA TRACE (*)    Bacteria, UA RARE (*)    Squamous Epithelial / LPF 0-5 (*)    All other components within normal limits  I-STAT CG4 LACTIC ACID, ED    EKG  EKG Interpretation  Date/Time:  Wednesday May 27 2016 13:09:21 EDT Ventricular Rate:  96 PR Interval:    QRS Duration: 148 QT Interval:  407 QTC Calculation: 515 R Axis:   -85 Text Interpretation:  Sinus rhythm Non-specific intra-ventricular conduction delay Left axis deviation Baseline wander T wave abnormality Artifact Abnormal ekg Confirmed by Gerhard Munch  MD [phone]) on 05/27/2016 2:03:23 PM        Procedures Procedures (including critical care time)  Medications Ordered in ED Medications  0.9 %  sodium chloride infusion (not administered)    Chart review notable for recent hospitalization for wound infection, sepsis.   Initial Impression / Assessment and Plan / ED Course  I have reviewed the triage vital signs and the nursing notes.  Pertinent labs & imaging results that were available during my care of the patient were reviewed by me and considered in my medical decision making (see chart for details).  Patient with chronic illness, including dementia, nonhealing sacral wound presents one week after hospitalization, now with concern with one abnormal blood pressure reading at an outpatient clinic. Here the patient is awake and alert, has several hours of monitoring, with no decompensation, no hypotension. No obvious evidence for new infection, and labs, vitals remained unremarkable. Patient discharged in stable condition to follow-up as scheduled with her physician within one week.  Final Clinical Impressions(s) / ED Diagnoses   Final diagnoses:    Weakness     Gerhard Munch, MD 05/27/16 1547

## 2016-05-27 NOTE — ED Notes (Signed)
Bed: RU04 Expected date:  Expected time:  Means of arrival:  Comments: 83 yo hypotension

## 2016-05-27 NOTE — ED Triage Notes (Signed)
Per EMS, pt comes from Urology (lives at home) and was hypotensive in the office (90/49).  Pt was just discharged Thursday for a septic sacral ulcer.  Pt has a hx of Alzheimers and is very agitated stating she has back pain and needs a pill.  Pt is in end-stage renal failure and has a chronic foley.  Upon assessment, blood pressure is 134/82.

## 2016-05-27 NOTE — Telephone Encounter (Signed)
Caller name: Marcelino Duster Relationship to patient: [facility] Can be reached: [phone] Pharmacy:  Reason for call: Evaluated patient yesterday and request verbal orders for OT 2 times a week for 3 weeks to work on self-feeding and toileting.

## 2016-05-27 NOTE — Telephone Encounter (Signed)
Verbal auth given to [place] at below number.

## 2016-05-27 NOTE — Discharge Instructions (Signed)
As discussed, today's evaluation in the emergency department has been reassuring. However, given your recent hospitalization, and your ongoing illness, it is important that you monitor your condition carefully, and do not hesitate to return for concerning changes in your condition. Otherwise, please be sure to follow-up with your physician as scheduled.

## 2016-05-29 DIAGNOSIS — L89310 Pressure ulcer of right buttock, unstageable: Secondary | ICD-10-CM | POA: Diagnosis not present

## 2016-05-29 DIAGNOSIS — L97418 Non-pressure chronic ulcer of right heel and midfoot with other specified severity: Secondary | ICD-10-CM | POA: Diagnosis not present

## 2016-05-29 DIAGNOSIS — L97512 Non-pressure chronic ulcer of other part of right foot with fat layer exposed: Secondary | ICD-10-CM | POA: Diagnosis not present

## 2016-05-29 DIAGNOSIS — L97428 Non-pressure chronic ulcer of left heel and midfoot with other specified severity: Secondary | ICD-10-CM | POA: Diagnosis not present

## 2016-05-29 DIAGNOSIS — L89150 Pressure ulcer of sacral region, unstageable: Secondary | ICD-10-CM | POA: Diagnosis not present

## 2016-05-29 DIAGNOSIS — E11621 Type 2 diabetes mellitus with foot ulcer: Secondary | ICD-10-CM | POA: Diagnosis not present

## 2016-06-01 ENCOUNTER — Telehealth: Payer: Self-pay

## 2016-06-01 ENCOUNTER — Encounter: Payer: Self-pay | Admitting: Family

## 2016-06-01 DIAGNOSIS — E43 Unspecified severe protein-calorie malnutrition: Secondary | ICD-10-CM | POA: Diagnosis not present

## 2016-06-01 DIAGNOSIS — L97518 Non-pressure chronic ulcer of other part of right foot with other specified severity: Secondary | ICD-10-CM | POA: Diagnosis not present

## 2016-06-01 DIAGNOSIS — L89310 Pressure ulcer of right buttock, unstageable: Secondary | ICD-10-CM | POA: Diagnosis not present

## 2016-06-01 DIAGNOSIS — L89610 Pressure ulcer of right heel, unstageable: Secondary | ICD-10-CM | POA: Diagnosis not present

## 2016-06-01 DIAGNOSIS — L89313 Pressure ulcer of right buttock, stage 3: Secondary | ICD-10-CM | POA: Diagnosis not present

## 2016-06-01 DIAGNOSIS — F0280 Dementia in other diseases classified elsewhere without behavioral disturbance: Secondary | ICD-10-CM | POA: Diagnosis not present

## 2016-06-01 DIAGNOSIS — L89620 Pressure ulcer of left heel, unstageable: Secondary | ICD-10-CM | POA: Diagnosis not present

## 2016-06-01 NOTE — Telephone Encounter (Signed)
Please see letter.

## 2016-06-01 NOTE — Telephone Encounter (Signed)
Melissa--Please advise?  Letter completed but I see that [name] is pt's general power of attorney and pt has listed daughter, [name] as her healthcare power of attorney. Annabell Sabal that I need to verify that it is ok to release health information to her and will let her know the outcome. [name] voices understanding.

## 2016-06-01 NOTE — Telephone Encounter (Signed)
[name] requested a letter stating patient is incapacitated and homebound.  Please see note in red folder for details.

## 2016-06-02 DIAGNOSIS — L89310 Pressure ulcer of right buttock, unstageable: Secondary | ICD-10-CM | POA: Diagnosis not present

## 2016-06-02 DIAGNOSIS — L97428 Non-pressure chronic ulcer of left heel and midfoot with other specified severity: Secondary | ICD-10-CM | POA: Diagnosis not present

## 2016-06-02 DIAGNOSIS — L97418 Non-pressure chronic ulcer of right heel and midfoot with other specified severity: Secondary | ICD-10-CM | POA: Diagnosis not present

## 2016-06-02 DIAGNOSIS — L97512 Non-pressure chronic ulcer of other part of right foot with fat layer exposed: Secondary | ICD-10-CM | POA: Diagnosis not present

## 2016-06-02 DIAGNOSIS — L89150 Pressure ulcer of sacral region, unstageable: Secondary | ICD-10-CM | POA: Diagnosis not present

## 2016-06-02 DIAGNOSIS — R338 Other retention of urine: Secondary | ICD-10-CM | POA: Diagnosis not present

## 2016-06-02 DIAGNOSIS — E11621 Type 2 diabetes mellitus with foot ulcer: Secondary | ICD-10-CM | POA: Diagnosis not present

## 2016-06-02 NOTE — Telephone Encounter (Signed)
Pt's daughter Bonita Quin called back in to speak with CMA. She says that according to her POA paperwork she is able to receive letter from provider. Informed Ms. Bonita Quin that I will have someone to call her back to follow up.   CB: [phone]

## 2016-06-02 NOTE — Telephone Encounter (Signed)
Daughter checking on the status of message below, please advise

## 2016-06-03 ENCOUNTER — Emergency Department (HOSPITAL_BASED_OUTPATIENT_CLINIC_OR_DEPARTMENT_OTHER)
Admission: EM | Admit: 2016-06-03 | Discharge: 2016-06-03 | Disposition: A | Discharge status: Home / Self Care | Payer: Medicare Other | Attending: Emergency Medicine | Admitting: Emergency Medicine

## 2016-06-03 ENCOUNTER — Encounter (HOSPITAL_BASED_OUTPATIENT_CLINIC_OR_DEPARTMENT_OTHER): Payer: Self-pay

## 2016-06-03 ENCOUNTER — Telehealth: Payer: Self-pay | Admitting: Family

## 2016-06-03 DIAGNOSIS — J45909 Unspecified asthma, uncomplicated: Secondary | ICD-10-CM | POA: Insufficient documentation

## 2016-06-03 DIAGNOSIS — I129 Hypertensive chronic kidney disease with stage 1 through stage 4 chronic kidney disease, or unspecified chronic kidney disease: Secondary | ICD-10-CM | POA: Diagnosis not present

## 2016-06-03 DIAGNOSIS — Z87891 Personal history of nicotine dependence: Secondary | ICD-10-CM | POA: Insufficient documentation

## 2016-06-03 DIAGNOSIS — E1122 Type 2 diabetes mellitus with diabetic chronic kidney disease: Secondary | ICD-10-CM | POA: Diagnosis not present

## 2016-06-03 DIAGNOSIS — N3000 Acute cystitis without hematuria: Secondary | ICD-10-CM | POA: Diagnosis not present

## 2016-06-03 DIAGNOSIS — R330 Drug induced retention of urine: Secondary | ICD-10-CM | POA: Diagnosis not present

## 2016-06-03 DIAGNOSIS — R339 Retention of urine, unspecified: Secondary | ICD-10-CM | POA: Diagnosis present

## 2016-06-03 DIAGNOSIS — Z7982 Long term (current) use of aspirin: Secondary | ICD-10-CM | POA: Diagnosis not present

## 2016-06-03 DIAGNOSIS — Z79899 Other long term (current) drug therapy: Secondary | ICD-10-CM | POA: Insufficient documentation

## 2016-06-03 DIAGNOSIS — N183 Chronic kidney disease, stage 3 (moderate): Secondary | ICD-10-CM | POA: Insufficient documentation

## 2016-06-03 DIAGNOSIS — Z7984 Long term (current) use of oral hypoglycemic drugs: Secondary | ICD-10-CM | POA: Insufficient documentation

## 2016-06-03 LAB — COMPREHENSIVE METABOLIC PANEL
ALT: 11 U/L — ABNORMAL LOW (ref 14–54)
ANION GAP: 13 (ref 5–15)
AST: 22 U/L (ref 15–41)
Albumin: 2.7 g/dL — ABNORMAL LOW (ref 3.5–5.0)
Alkaline Phosphatase: 79 U/L (ref 38–126)
BUN: 21 mg/dL — AB (ref 6–20)
CHLORIDE: 104 mmol/L (ref 101–111)
CO2: 26 mmol/L (ref 22–32)
Calcium: 9.5 mg/dL (ref 8.9–10.3)
Creatinine, Ser: 0.86 mg/dL (ref 0.44–1.00)
GFR calc Af Amer: >60 mL/min (ref >60)
GFR calc non Af Amer: 58 mL/min — ABNORMAL LOW (ref >60)
GLUCOSE: 160 mg/dL — AB (ref 65–99)
POTASSIUM: 3.7 mmol/L (ref 3.5–5.1)
Sodium: 143 mmol/L (ref 135–145)
TOTAL PROTEIN: 7 g/dL (ref 6.5–8.1)
Total Bilirubin: 0.7 mg/dL (ref 0.3–1.2)

## 2016-06-03 LAB — CBC WITH DIFFERENTIAL/PLATELET
BASOS ABS: 0.1 10*3/uL (ref 0.0–0.1)
Basophils Relative: 1 %
EOS PCT: 5 %
Eosinophils Absolute: 0.3 10*3/uL (ref 0.0–0.7)
HEMATOCRIT: 29.4 % — AB (ref 36.0–46.0)
HEMOGLOBIN: 9.7 g/dL — AB (ref 12.0–15.0)
LYMPHS PCT: 16 %
Lymphs Abs: 1.1 10*3/uL (ref 0.7–4.0)
MCH: 29.4 pg (ref 26.0–34.0)
MCHC: 33 g/dL (ref 30.0–36.0)
MCV: 89.1 fL (ref 78.0–100.0)
MONOS PCT: 12 %
Monocytes Absolute: 0.8 10*3/uL (ref 0.1–1.0)
NEUTROS ABS: 4.4 10*3/uL (ref 1.7–7.7)
Neutrophils Relative %: 66 %
Platelets: 429 10*3/uL — ABNORMAL HIGH (ref 150–400)
RBC: 3.3 MIL/uL — AB (ref 3.87–5.11)
RDW: 17.8 % — ABNORMAL HIGH (ref 11.5–15.5)
WBC: 6.7 10*3/uL (ref 4.0–10.5)

## 2016-06-03 LAB — URINALYSIS, ROUTINE W REFLEX MICROSCOPIC
Glucose, UA: NEGATIVE mg/dL
Hgb urine dipstick: NEGATIVE
Ketones, ur: NEGATIVE mg/dL
NITRITE: NEGATIVE
PH: 6 (ref 5.0–8.0)
Protein, ur: NEGATIVE mg/dL
SPECIFIC GRAVITY, URINE: 1.023 (ref 1.005–1.030)

## 2016-06-03 LAB — URINALYSIS, MICROSCOPIC (REFLEX)

## 2016-06-03 LAB — I-STAT CG4 LACTIC ACID, ED: Lactic Acid, Venous: 1.29 mmol/L (ref 0.5–1.9)

## 2016-06-03 MED ORDER — DEXTROSE 5 % IV SOLN
1.0000 g | Freq: Once | INTRAVENOUS | Status: AC
  Administered 2016-06-03: 1 g via INTRAVENOUS
  Filled 2016-06-03: qty 10

## 2016-06-03 MED ORDER — SODIUM CHLORIDE 0.9 % IV BOLUS (SEPSIS)
500.0000 mL | Freq: Once | INTRAVENOUS | Status: AC
  Administered 2016-06-03: 500 mL via INTRAVENOUS

## 2016-06-03 MED ORDER — CEPHALEXIN 500 MG PO CAPS: 500.0000 mg | ORAL_CAPSULE | Freq: Two times a day (BID) | ORAL | 0 refills | Status: AC

## 2016-06-03 MED FILL — CEPHALEXIN 500 MG CAPSULE: 500 | 7 days supply | Qty: 14 | Fill #0

## 2016-06-03 NOTE — ED Notes (Signed)
Patient denies pain and is resting comfortably.  

## 2016-06-03 NOTE — Telephone Encounter (Signed)
Received authorization to release letter. Notified [name] and placed letter at the front desk for pick up.

## 2016-06-03 NOTE — ED Notes (Signed)
Per daughter and caretaker, has skin break down to right hip and feet. All areas covered with drsg.

## 2016-06-03 NOTE — Telephone Encounter (Signed)
Notified Elizabeth Keller of below and she voices understanding. Requests that we call her as soon as possible with outcome.

## 2016-06-03 NOTE — Telephone Encounter (Signed)
Pt daughter [facility] called to report pt was able to urinate after removal of catheter yesterday. She knows to report this to provider that cared for her and ordered cath.

## 2016-06-03 NOTE — Discharge Instructions (Signed)

## 2016-06-03 NOTE — ED Triage Notes (Addendum)
Pt presents with her daughter Melina Modena) who states patient had a urinary catheter placed less than a week ago for urinary retention at time of hospital discharge - urinary catheter removed yesterday by [facility], pt did void last night and had an incontinent episode this morning - daughter wants bladder scan.  Daughter reports baseline dementia.

## 2016-06-03 NOTE — ED Provider Notes (Signed)
Emergency Department Provider Note   I have reviewed the triage vital signs and the nursing notes.  Level 5 caveat: Dementia.   HISTORY  Chief Complaint Urinary Retention   HPI Elizabeth Keller is a 83 y.o. female with PMH of dementia, DM, sacral decubitus ulcer, HTN, GERD, and urinary obstruction presents to the emergency department for evaluation of occult urinating. The patient was admitted for hypotension and presumed severe sepsis. During that admission a Foley catheter was placed because of urinary obstruction. She is been followed after discharge and the catheter was removed in the office yesterday with [facility] for void trial. She had urine in her diaper yesterday evening but this morning there was very little if any and she is continued to have no urine output throughout the morning. The patient's caregiver at bedside denies any fever or chills. She has been eating her normal amount. No change in activity level. The caregiver states that her blood pressure is often low but then improves. She states it's been like that for several weeks now but the patient is not having any new signs or symptoms of infection as far she has noticed.   Past Medical History:  Diagnosis Date  . Arthritis   . Asthma   . Degenerative disk disease   . Diabetes mellitus   . GERD (gastroesophageal reflux disease)   . Hypercholesteremia   . Hypertension   . IBS (irritable bowel syndrome)   . Sarcoidosis   . Spinal stenosis     Patient Active Problem List   Diagnosis Date Noted  . Severe sepsis (HCC) 05/15/2016  . Pressure ulcer of unspecified buttock, stage 2 04/04/2016  . Generalized abdominal pain 04/04/2016  . DM2 (diabetes mellitus, type 2) (HCC) 04/04/2016  . Hyperkalemia 04/03/2016  . Chronic kidney disease, stage III (moderate) 03/14/2016    Past Surgical History:  Procedure Laterality Date  . EYE SURGERY  2017   cataract / glaucoma    Current Outpatient Rx  . Order #:  [id number] Class: Historical Med  . Order #: [id number] Class: Historical Med  . Order #: [id number] Class: Historical Med  . Order #: [id number] Class: Historical Med  . Order #: [id number] Class: Normal  . Order #: [id number] Class: Print  . Order #: [id number] Class: Historical Med  . Order #: [id number] Class: Print  . Order #: [id number] Class: Historical Med  . Order #: [id number] Class: Normal  . Order #: [id number] Class: Normal  . Order #: [id number] Class: Normal  . Order #: [id number] Class: Historical Med  . Order #: [id number] Class: Normal  . Order #: [id number] Class: Historical Med  . Order #: [id number] Class: Historical Med  . Order #: [id number] Class: Historical Med    Allergies Dicyclomine; Gabapentin; Metformin and related; Lyrica [pregabalin]; and Topiramate  Family History  Problem Relation Age of Onset  . Hypertension Other     Social History Social History  Substance Use Topics  . Smoking status: Former Games developer  . Smokeless tobacco: Never Used  . Alcohol use No    Review of Systems  Level 5 caveat: dementia  ____________________________________________   PHYSICAL EXAM:  VITAL SIGNS: ED Triage Vitals  Enc Vitals Group     BP 06/03/16 1119 (!) 70/48     Pulse Rate 06/03/16 1117 98     Resp 06/03/16 1117 18     Temp 06/03/16 1117 98.6 F (37 C)     Temp Source 06/03/16 1117 Oral     SpO2 06/03/16 1117 98 %     Weight 06/03/16 1117 135 lb (61.2  kg)     Height 06/03/16 1117  (1.727 m)   Constitutional: Alert with baseline confusion.  Eyes: Conjunctivae are normal.  Head: Atraumatic. Nose: No congestion/rhinnorhea. Mouth/Throat: Mucous membranes are dry.  Neck: No stridor.  Cardiovascular: Normal rate, regular rhythm. Good peripheral circulation. Grossly normal heart sounds.   Respiratory: Normal respiratory effort.  No retractions. Lungs CTAB. Gastrointestinal: Soft and= with mild lower abdominal fullness. No distention. Dry diaper in place.  Musculoskeletal: No  lower extremity tenderness nor edema. No gross deformities of extremities. Neurologic:  No gross focal neurologic deficits are appreciated.  Skin:  Skin is warm, dry and intact. No rash noted.   ____________________________________________   LABS (all labs ordered are listed, but only abnormal results are displayed)  Labs Reviewed  COMPREHENSIVE METABOLIC PANEL - Abnormal; Notable for the following:       Result Value   Glucose, Bld 160 (*)    BUN 21 (*)    Albumin 2.7 (*)    ALT 11 (*)    GFR calc non Af Amer 58 (*)    All other components within normal limits  CBC WITH DIFFERENTIAL/PLATELET - Abnormal; Notable for the following:    RBC 3.30 (*)    Hemoglobin 9.7 (*)    HCT 29.4 (*)    RDW 17.8 (*)    Platelets 429 (*)    All other components within normal limits  URINALYSIS, ROUTINE W REFLEX MICROSCOPIC - Abnormal; Notable for the following:    APPearance CLOUDY (*)    Bilirubin Urine SMALL (*)    Leukocytes, UA MODERATE (*)    All other components within normal limits  URINALYSIS, MICROSCOPIC (REFLEX) - Abnormal; Notable for the following:    Bacteria, UA MANY (*)    Squamous Epithelial / LPF 0-5 (*)    All other components within normal limits  I-STAT CG4 LACTIC ACID, ED   ____________________________________________  EKG  Similar to prior. Reviewed at bedside and in MUSE. No STEMI.  ____________________________________________  RADIOLOGY  None ____________________________________________   PROCEDURES  Procedure(s) performed:   Procedures  None  ____________________________________________   INITIAL IMPRESSION / ASSESSMENT AND PLAN / ED COURSE  Pertinent labs & imaging results that were available during my care of the patient were reviewed by me and considered in my medical decision making (see chart for details).  Patient presents to the emergency department for evaluation of urinary retention. She has 460 ml on bladder scan with dry diaper in  place. She does have some mild lower abdominal tenderness to palpation. Exam and history is limited by the patient's dementia. Her initial blood pressure was very low but then taken immediately afterwards was normal. No objective signs or symptoms to suggest infection. Plan to reinsert the Foley catheter with urinary retention here. Given her initial low blood pressure plan for labs to evaluate for infection but caregiver reports frequent hypotension episodes recently. Will plan to keep foley in place and discuss with Urology regarding repeat void trial vs chronic foley use and home health mgmt.   The patient is noted to have a MAP's <65/ SBP's <90. With the current information available to me, I don't think the patient is in septic shock. The MAP's <65/ SBP's <90, is related to reading is invalid or erroneous.  Labs work is largely unremarkable. Possible UTI on UA. With placing foley now will treat this with Rocephin and Keflex at home. Plan for discharge with Urology follow up and PCP follow up plan.  At this time, I do not feel there is any life-threatening condition present. I have reviewed and discussed all results (EKG, imaging, lab, urine as appropriate), exam findings with patient. I have reviewed nursing notes and appropriate previous records.  I feel the patient is safe to be discharged home without further emergent workup. Discussed usual and customary return precautions. Patient and family (if present) verbalize understanding and are comfortable with this plan.  Patient will follow-up with their primary care provider. If they do not have a primary care provider, information for follow-up has been provided to them. All questions have been answered.  ____________________________________________  FINAL CLINICAL IMPRESSION(S) / ED DIAGNOSES  Final diagnoses:  Urinary retention  Acute cystitis without hematuria     MEDICATIONS GIVEN DURING THIS VISIT:  Medications  sodium chloride 0.9 %  bolus 500 mL (0 mLs Intravenous Stopped 06/03/16 1400)  cefTRIAXone (ROCEPHIN) 1 g in dextrose 5 % 50 mL IVPB (0 g Intravenous Stopped 06/03/16 1400)     NEW OUTPATIENT MEDICATIONS STARTED DURING THIS VISIT:  Discharge Medication List as of 06/03/2016 12:54 PM    START taking these medications   Details  cephALEXin (KEFLEX) 500 MG capsule Take 1 capsule (500 mg total) by mouth 2 (two) times daily., Starting Wed 06/03/2016, Until Wed 06/10/2016, Print          Note:  This document was prepared using Dragon voice recognition software and may include unintentional dictation errors.  Alona Bene, MD Emergency Medicine   Maia Plan, MD 06/03/16 [id number]

## 2016-06-03 NOTE — Telephone Encounter (Signed)
We will let her know by this afternoon.

## 2016-06-04 DIAGNOSIS — L97418 Non-pressure chronic ulcer of right heel and midfoot with other specified severity: Secondary | ICD-10-CM | POA: Diagnosis not present

## 2016-06-04 DIAGNOSIS — L97512 Non-pressure chronic ulcer of other part of right foot with fat layer exposed: Secondary | ICD-10-CM | POA: Diagnosis not present

## 2016-06-04 DIAGNOSIS — L89310 Pressure ulcer of right buttock, unstageable: Secondary | ICD-10-CM | POA: Diagnosis not present

## 2016-06-04 DIAGNOSIS — L89150 Pressure ulcer of sacral region, unstageable: Secondary | ICD-10-CM | POA: Diagnosis not present

## 2016-06-04 DIAGNOSIS — E11621 Type 2 diabetes mellitus with foot ulcer: Secondary | ICD-10-CM | POA: Diagnosis not present

## 2016-06-04 DIAGNOSIS — L97428 Non-pressure chronic ulcer of left heel and midfoot with other specified severity: Secondary | ICD-10-CM | POA: Diagnosis not present

## 2016-06-08 DIAGNOSIS — S91104A Unspecified open wound of right lesser toe(s) without damage to nail, initial encounter: Secondary | ICD-10-CM | POA: Diagnosis not present

## 2016-06-08 DIAGNOSIS — L89620 Pressure ulcer of left heel, unstageable: Secondary | ICD-10-CM | POA: Diagnosis not present

## 2016-06-08 DIAGNOSIS — L89152 Pressure ulcer of sacral region, stage 2: Secondary | ICD-10-CM | POA: Diagnosis not present

## 2016-06-08 DIAGNOSIS — L89610 Pressure ulcer of right heel, unstageable: Secondary | ICD-10-CM | POA: Diagnosis not present

## 2016-06-08 DIAGNOSIS — L89310 Pressure ulcer of right buttock, unstageable: Secondary | ICD-10-CM | POA: Diagnosis not present

## 2016-06-08 DIAGNOSIS — F0280 Dementia in other diseases classified elsewhere without behavioral disturbance: Secondary | ICD-10-CM | POA: Diagnosis not present

## 2016-06-08 DIAGNOSIS — E43 Unspecified severe protein-calorie malnutrition: Secondary | ICD-10-CM | POA: Diagnosis not present

## 2016-06-08 DIAGNOSIS — L89313 Pressure ulcer of right buttock, stage 3: Secondary | ICD-10-CM | POA: Diagnosis not present

## 2016-06-08 DIAGNOSIS — L89322 Pressure ulcer of left buttock, stage 2: Secondary | ICD-10-CM | POA: Diagnosis not present

## 2016-06-08 DIAGNOSIS — L97518 Non-pressure chronic ulcer of other part of right foot with other specified severity: Secondary | ICD-10-CM | POA: Diagnosis not present

## 2016-06-08 DIAGNOSIS — L89613 Pressure ulcer of right heel, stage 3: Secondary | ICD-10-CM | POA: Diagnosis not present

## 2016-06-08 DIAGNOSIS — L89622 Pressure ulcer of left heel, stage 2: Secondary | ICD-10-CM | POA: Diagnosis not present

## 2016-06-09 ENCOUNTER — Telehealth: Payer: Self-pay | Admitting: Family

## 2016-06-09 DIAGNOSIS — L97418 Non-pressure chronic ulcer of right heel and midfoot with other specified severity: Secondary | ICD-10-CM | POA: Diagnosis not present

## 2016-06-09 DIAGNOSIS — L97428 Non-pressure chronic ulcer of left heel and midfoot with other specified severity: Secondary | ICD-10-CM | POA: Diagnosis not present

## 2016-06-09 DIAGNOSIS — E11621 Type 2 diabetes mellitus with foot ulcer: Secondary | ICD-10-CM | POA: Diagnosis not present

## 2016-06-09 DIAGNOSIS — L89150 Pressure ulcer of sacral region, unstageable: Secondary | ICD-10-CM | POA: Diagnosis not present

## 2016-06-09 DIAGNOSIS — L89310 Pressure ulcer of right buttock, unstageable: Secondary | ICD-10-CM | POA: Diagnosis not present

## 2016-06-09 DIAGNOSIS — L97512 Non-pressure chronic ulcer of other part of right foot with fat layer exposed: Secondary | ICD-10-CM | POA: Diagnosis not present

## 2016-06-09 NOTE — Telephone Encounter (Signed)
Caller name: Marcelino Duster  Relation to pt: OT from [facility]  Call back number: [phone]    Reason for call:  OT wanted to Inform NP patient will be discharge 06/11/16 from OT, due to level of cognition and patient has not been participating.

## 2016-06-09 NOTE — Telephone Encounter (Signed)
Noted  

## 2016-06-10 DIAGNOSIS — L89310 Pressure ulcer of right buttock, unstageable: Secondary | ICD-10-CM | POA: Diagnosis not present

## 2016-06-10 DIAGNOSIS — L89150 Pressure ulcer of sacral region, unstageable: Secondary | ICD-10-CM | POA: Diagnosis not present

## 2016-06-10 DIAGNOSIS — L97512 Non-pressure chronic ulcer of other part of right foot with fat layer exposed: Secondary | ICD-10-CM | POA: Diagnosis not present

## 2016-06-10 DIAGNOSIS — E11621 Type 2 diabetes mellitus with foot ulcer: Secondary | ICD-10-CM | POA: Diagnosis not present

## 2016-06-10 DIAGNOSIS — L97428 Non-pressure chronic ulcer of left heel and midfoot with other specified severity: Secondary | ICD-10-CM | POA: Diagnosis not present

## 2016-06-10 DIAGNOSIS — L97418 Non-pressure chronic ulcer of right heel and midfoot with other specified severity: Secondary | ICD-10-CM | POA: Diagnosis not present

## 2016-06-11 ENCOUNTER — Telehealth: Payer: Self-pay | Admitting: Family

## 2016-06-11 DIAGNOSIS — L97418 Non-pressure chronic ulcer of right heel and midfoot with other specified severity: Secondary | ICD-10-CM | POA: Diagnosis not present

## 2016-06-11 DIAGNOSIS — E11621 Type 2 diabetes mellitus with foot ulcer: Secondary | ICD-10-CM | POA: Diagnosis not present

## 2016-06-11 DIAGNOSIS — L89310 Pressure ulcer of right buttock, unstageable: Secondary | ICD-10-CM | POA: Diagnosis not present

## 2016-06-11 DIAGNOSIS — L89150 Pressure ulcer of sacral region, unstageable: Secondary | ICD-10-CM | POA: Diagnosis not present

## 2016-06-11 DIAGNOSIS — L97428 Non-pressure chronic ulcer of left heel and midfoot with other specified severity: Secondary | ICD-10-CM | POA: Diagnosis not present

## 2016-06-11 DIAGNOSIS — L97512 Non-pressure chronic ulcer of other part of right foot with fat layer exposed: Secondary | ICD-10-CM | POA: Diagnosis not present

## 2016-06-11 NOTE — Telephone Encounter (Signed)
Pt daughter Herminio HeadsLinda Moore gave new POA paperwork today. We scanned it. She states the other sister Louie CasaSiscero [name] has old healthcare POA and it is no longer valid. Herminio HeadsLinda Moore req we do not talk to siscero. Pt informed we will document her request. Ms Christell ConstantMoore [facility]. [name] ph [phone].

## 2016-06-11 NOTE — Telephone Encounter (Signed)
Pt daughter Herminio Headslinda moore dropped off FL2 form and a Physicist, medicalletter for [employer]. Put in dr bin.

## 2016-06-12 DIAGNOSIS — T83511D Infection and inflammatory reaction due to indwelling urethral catheter, subsequent encounter: Secondary | ICD-10-CM | POA: Diagnosis not present

## 2016-06-12 DIAGNOSIS — R339 Retention of urine, unspecified: Secondary | ICD-10-CM | POA: Diagnosis not present

## 2016-06-12 DIAGNOSIS — N390 Urinary tract infection, site not specified: Secondary | ICD-10-CM | POA: Diagnosis not present

## 2016-06-12 NOTE — Telephone Encounter (Signed)
Daughter checking status of FL2, call back [phone]

## 2016-06-13 ENCOUNTER — Emergency Department (HOSPITAL_BASED_OUTPATIENT_CLINIC_OR_DEPARTMENT_OTHER)
Admission: EM | Admit: 2016-06-13 | Discharge: 2016-06-13 | Disposition: A | Discharge status: Home / Self Care | Payer: Medicare Other | Attending: Emergency Medicine | Admitting: Emergency Medicine

## 2016-06-13 ENCOUNTER — Encounter (HOSPITAL_BASED_OUTPATIENT_CLINIC_OR_DEPARTMENT_OTHER): Payer: Self-pay | Admitting: Emergency Medicine

## 2016-06-13 DIAGNOSIS — E1122 Type 2 diabetes mellitus with diabetic chronic kidney disease: Secondary | ICD-10-CM | POA: Diagnosis not present

## 2016-06-13 DIAGNOSIS — Z79899 Other long term (current) drug therapy: Secondary | ICD-10-CM | POA: Diagnosis not present

## 2016-06-13 DIAGNOSIS — Y828 Other medical devices associated with adverse incidents: Secondary | ICD-10-CM | POA: Insufficient documentation

## 2016-06-13 DIAGNOSIS — Z87891 Personal history of nicotine dependence: Secondary | ICD-10-CM | POA: Insufficient documentation

## 2016-06-13 DIAGNOSIS — J45909 Unspecified asthma, uncomplicated: Secondary | ICD-10-CM | POA: Diagnosis not present

## 2016-06-13 DIAGNOSIS — I129 Hypertensive chronic kidney disease with stage 1 through stage 4 chronic kidney disease, or unspecified chronic kidney disease: Secondary | ICD-10-CM | POA: Insufficient documentation

## 2016-06-13 DIAGNOSIS — T839XXA Unspecified complication of genitourinary prosthetic device, implant and graft, initial encounter: Secondary | ICD-10-CM

## 2016-06-13 DIAGNOSIS — Z7982 Long term (current) use of aspirin: Secondary | ICD-10-CM | POA: Insufficient documentation

## 2016-06-13 DIAGNOSIS — N183 Chronic kidney disease, stage 3 (moderate): Secondary | ICD-10-CM | POA: Insufficient documentation

## 2016-06-13 DIAGNOSIS — T83091A Other mechanical complication of indwelling urethral catheter, initial encounter: Secondary | ICD-10-CM | POA: Insufficient documentation

## 2016-06-13 LAB — URINALYSIS, ROUTINE W REFLEX MICROSCOPIC
BILIRUBIN URINE: NEGATIVE
Glucose, UA: NEGATIVE mg/dL
KETONES UR: NEGATIVE mg/dL
NITRITE: NEGATIVE
PROTEIN: NEGATIVE mg/dL
SPECIFIC GRAVITY, URINE: 1.019 (ref 1.005–1.030)
pH: 7 (ref 5.0–8.0)

## 2016-06-13 LAB — URINALYSIS, MICROSCOPIC (REFLEX): SQUAMOUS EPITHELIAL / LPF: NONE SEEN

## 2016-06-13 NOTE — ED Provider Notes (Signed)
[facility] Note   CSN: [id number] Arrival date & time: 06/13/16  1250     History   Chief Complaint Chief Complaint  Patient presents with  . Urinary Retention    HPI Elizabeth Keller is a 83 y.o. female.  HPI Patient with recent diagnosis of urinary retention and full catheter placement. Was seen in the urologist office yesterday. It brought in by daughter after Foley came out this morning while bathing. Patient has history dementia. Level V caveat applies. Past Medical History:  Diagnosis Date  . Arthritis   . Asthma   . Degenerative disk disease   . Diabetes mellitus   . GERD (gastroesophageal reflux disease)   . Hypercholesteremia   . Hypertension   . IBS (irritable bowel syndrome)   . Sarcoidosis   . Spinal stenosis     Patient Active Problem List   Diagnosis Date Noted  . Severe sepsis (HCC) 05/15/2016  . Pressure ulcer of unspecified buttock, stage 2 04/04/2016  . Generalized abdominal pain 04/04/2016  . DM2 (diabetes mellitus, type 2) (HCC) 04/04/2016  . Hyperkalemia 04/03/2016  . Chronic kidney disease, stage III (moderate) 03/14/2016    Past Surgical History:  Procedure Laterality Date  . EYE SURGERY  2017   cataract / glaucoma    OB History    No data available       Home Medications    Prior to Admission medications   Medication Sig Start Date End Date Taking? Authorizing Provider  acidophilus (RISAQUAD) CAPS capsule Take 1 capsule by mouth daily.   Yes [provider]  albuterol (PROVENTIL HFA;VENTOLIN HFA) 108 (90 Base) MCG/ACT inhaler Inhale 1-2 puffs into the lungs every 6 (six) hours as needed for wheezing or shortness of breath.    Yes [provider]  albuterol (PROVENTIL) (2.5 MG/3ML) 0.083% nebulizer solution Take 2.5 mg by nebulization every 6 (six) hours as needed for wheezing or shortness of breath.   Yes [provider]  aspirin EC 81 MG tablet Take 81 mg by mouth daily.   Yes  [provider]  bisacodyl (DULCOLAX) 10 MG suppository Place 1 suppository (10 mg total) rectally daily as needed for moderate constipation. 05/20/16  Yes Sheikh, Omair Latif, DO  cholecalciferol (VITAMIN D) 1000 units tablet Take 1,000 Units by mouth daily.   Yes [provider]  clonazePAM (KLONOPIN) 0.5 MG tablet TAKE 1/2 TABLET BY MOUTH DAILY IN THE MORNING, 1 TABLET AT 2PM, MAY TAKE 1/2 TABLET BETWEEN MORNING AND 2PM, THEN DELAY 2PM DOSE TO 7PM 05/11/16  Yes Wendling, Jilda RocheNicholas Paul, DO  collagenase (SANTYL) ointment Apply 1 application topically daily. Pt applies to wound on back.   Yes [provider]  diclofenac sodium (VOLTAREN) 1 % GEL Apply 2 g topically 4 (four) times daily as needed (for pain). Pt applies to back. 05/26/16  Yes Sandford Craze'Sullivan, Melissa, NP  feeding supplement, ENSURE ENLIVE, (ENSURE ENLIVE) LIQD Take 237 mLs by mouth 3 (three) times daily between meals. 05/20/16  Yes Sheikh, Omair Latif, DO  megestrol (MEGACE) 20 MG tablet Take 1 tablet (20 mg total) by mouth daily. 04/03/16  Yes Sandford Craze'Sullivan, Melissa, NP  metFORMIN (GLUCOPHAGE) 500 MG tablet Take 500 mg by mouth daily with breakfast.    Yes [provider]  Multiple Vitamin (MULTIVITAMIN WITH MINERALS) TABS tablet Take 1 tablet by mouth daily. 05/21/16  Yes Sheikh, Omair Latif, DO  omeprazole (PRILOSEC) 40 MG capsule Take 40 mg by mouth daily.    Yes [provider]  simvastatin (ZOCOR) 40 MG tablet Take 40 mg by mouth at bedtime.    Yes [provider]  torsemide (DEMADEX) 5 MG tablet Take 5 mg by mouth daily.   Yes [provider]    Family History Family History  Problem Relation Age of Onset  . Hypertension Other     Social History Social History  Substance Use Topics  . Smoking status: Former Games developer  . Smokeless tobacco: Never Used  . Alcohol use No     Allergies   Dicyclomine; Gabapentin; Metformin and related; Lyrica [pregabalin]; and  Topiramate   Review of Systems Review of Systems  Unable to perform ROS: Dementia     Physical Exam Updated Vital Signs BP (!) 119/54 (BP Location: Right Arm)   Pulse 85   Temp 98.4 F (36.9 C) (Oral)   Resp (!) 22   Ht 5\' 7"  (1.702 m)   Wt 120 lb (54.4 kg)   SpO2 97%   BMI 18.79 kg/m   Physical Exam  Constitutional: She appears well-developed and well-nourished.  HENT:  Head: Normocephalic and atraumatic.  Mouth/Throat: Oropharynx is clear and moist.  Eyes: EOM are normal. Pupils are equal, round, and reactive to light.  Neck: Normal range of motion. Neck supple.  Cardiovascular: Normal rate and regular rhythm.   Pulmonary/Chest: Effort normal and breath sounds normal.  Abdominal: Soft. Bowel sounds are normal. There is no tenderness. There is no rebound and no guarding.  Abdomen soft and nontender.  Musculoskeletal: Normal range of motion. She exhibits no edema or tenderness.  Neurological: She is alert.  Moving all extremities without deficit. Sensation intact.  Skin: Skin is warm and dry. Capillary refill takes less than 2 seconds. No rash noted. No erythema.  Sacral decubitus ulcer  Psychiatric:  Baseline Dementia. Not following commands. Patient is at her normal mental status per daughter.  Nursing note and vitals reviewed.    ED Treatments / Results  Labs (all labs ordered are listed, but only abnormal results are displayed) Labs Reviewed  URINALYSIS, ROUTINE W REFLEX MICROSCOPIC    EKG  EKG Interpretation None       Radiology No results found.  Procedures Procedures (including critical care time)  Medications Ordered in ED Medications - No data to display   Initial Impression / Assessment and Plan / ED Course  I have reviewed the triage vital signs and the nursing notes.  Pertinent labs & imaging results that were available during my care of the patient were reviewed by me and considered in my medical decision making (see chart for  details).     Full catheter was placed. Will watch the emergency department to make sure the patient can urinate. If she is unable to urinate will replace cath and have her follow-up with urology. Signed out to oncoming emergency physician pending observation for urine production. Final Clinical Impressions(s) / ED Diagnoses   Final diagnoses:  Problem with Foley catheter, initial encounter [facility])    New Prescriptions New Prescriptions   No medications on file     Loren Racer, MD 06/14/16 1039

## 2016-06-13 NOTE — ED Notes (Signed)
Pt cleansed of stool and dressing changed to sacral decub, . approx  5 in x 5 inches, , foley was found with  Balloon intact in pt s shorts, per family foley fell out this am while they were bathing her, per  Family member she  She tried to put it back in but did think she did it right

## 2016-06-13 NOTE — ED Triage Notes (Signed)
Daughter states," I think her foley catheter may have come out and I not sure if it is in or not"

## 2016-06-13 NOTE — ED Notes (Signed)
Family member given d/c instructions as per chart. Verbalizes understanding. No questions.

## 2016-06-13 NOTE — ED Notes (Addendum)
Per dr Ranae Palmsyelverton do not put back in foley and see if pt can void, Dr  made aware  Of notes from urology office visit yesterday, that they did ua and culture

## 2016-06-13 NOTE — Discharge Instructions (Signed)
Follow-up with your urologist.  

## 2016-06-15 DIAGNOSIS — L89150 Pressure ulcer of sacral region, unstageable: Secondary | ICD-10-CM | POA: Diagnosis not present

## 2016-06-15 DIAGNOSIS — L89310 Pressure ulcer of right buttock, unstageable: Secondary | ICD-10-CM | POA: Diagnosis not present

## 2016-06-15 DIAGNOSIS — L97428 Non-pressure chronic ulcer of left heel and midfoot with other specified severity: Secondary | ICD-10-CM | POA: Diagnosis not present

## 2016-06-15 DIAGNOSIS — L97418 Non-pressure chronic ulcer of right heel and midfoot with other specified severity: Secondary | ICD-10-CM | POA: Diagnosis not present

## 2016-06-15 DIAGNOSIS — L97512 Non-pressure chronic ulcer of other part of right foot with fat layer exposed: Secondary | ICD-10-CM | POA: Diagnosis not present

## 2016-06-15 DIAGNOSIS — E11621 Type 2 diabetes mellitus with foot ulcer: Secondary | ICD-10-CM | POA: Diagnosis not present

## 2016-06-15 NOTE — Telephone Encounter (Signed)
Daughter [name]([name] Moore) called stating that she spoke with Efraim KaufmannMelissa and was informed that she is not POA over patient. Daughter was crying and stated that she can not talk with her sister who she was informed is POA. Stated she needs Melissa to complete FL2 form for her.

## 2016-06-15 NOTE — Telephone Encounter (Signed)
Notified Elizabeth Keller of below contact numbers and she will start there. She continues to request FL2 form be completed and I advised her that PCP is not able to complete FL2 until legal aspects are addressed.

## 2016-06-15 NOTE — Telephone Encounter (Signed)
Forms have been received and [facility] to [facility] for [facility].

## 2016-06-15 NOTE — Telephone Encounter (Addendum)
Caller name: [name]  Relation to pt: daughter / POA Call back number: [phone]    Reason for call:  Daughter checking on the status of FL2 forms informed daughter of message below, daughter states time sensitive, Rehab is given patient until Friday 06/19/16 to admit, please advise

## 2016-06-15 NOTE — Telephone Encounter (Signed)
Please let patient know that we understand her frustration but we are unable to complete FL2 unless her official HCPOA requests placement. As I discussed with her earlier, she needs to speak with her sister Sicero and/or her seek legal assistance if she wishes to challenge her sister's HCPOA.

## 2016-06-15 NOTE — Telephone Encounter (Signed)
No, I have not.    Ashlee-- have you seen this or do you have the form now?

## 2016-06-15 NOTE — Telephone Encounter (Signed)
I reviewed letter from Elizabeth HeadsLinda Keller. She states she now has HCPOA and brings a HCPOA  Form which was signed on 06/04/16 by the patient. She wants patient moved to a rehab facility.  I advised Elizabeth Keller that since Elizabeth Keller lacks capacity, this new HCPOA is void.  I advised her to touch base with her sister Elizabeth Keller who is HCPOA and if she is agreeable to SNF, then I will be more than happy to fill out FL2.  She became very tearful and upset. I advised her to also speak with her attorney about any legal questions/advice that she may have/need.  She was requesting specific information about the original HCPOA and I advised her that I am not at liberty to share that information due to HIPPA.

## 2016-06-15 NOTE — Telephone Encounter (Signed)
This is not in my bin, have you seen it?

## 2016-06-15 NOTE — Telephone Encounter (Signed)
Form placed in PCP red folder along with copy of most recent healthcare power of attorney.

## 2016-06-15 NOTE — Telephone Encounter (Signed)
Notified [name] of below. She requests contact number for our attorney so her attorney can contact them regarding HCPOA matter. Also states that HCPOA submitted [facility] by [facility] would not be valid if Norval MortonWallace Harmon signed it as a witness as that is her son-in-law. Notified PCP of concerns. She recommends that [employer] EnergyLinda contact Medical Records at [phone] and request pt's medical records which will include previous HCPOA document as well as the office of [facility] [phone]) to discuss next steps that she can take regarding legal counsel.

## 2016-06-16 DIAGNOSIS — Z993 Dependence on wheelchair: Secondary | ICD-10-CM | POA: Diagnosis not present

## 2016-06-16 DIAGNOSIS — B965 Pseudomonas (aeruginosa) (mallei) (pseudomallei) as the cause of diseases classified elsewhere: Secondary | ICD-10-CM | POA: Diagnosis not present

## 2016-06-16 DIAGNOSIS — Z7984 Long term (current) use of oral hypoglycemic drugs: Secondary | ICD-10-CM | POA: Diagnosis not present

## 2016-06-16 DIAGNOSIS — M4800 Spinal stenosis, site unspecified: Secondary | ICD-10-CM | POA: Diagnosis not present

## 2016-06-16 DIAGNOSIS — R339 Retention of urine, unspecified: Secondary | ICD-10-CM | POA: Diagnosis not present

## 2016-06-16 DIAGNOSIS — D631 Anemia in chronic kidney disease: Secondary | ICD-10-CM | POA: Diagnosis not present

## 2016-06-16 DIAGNOSIS — N183 Chronic kidney disease, stage 3 (moderate): Secondary | ICD-10-CM | POA: Diagnosis not present

## 2016-06-16 DIAGNOSIS — L0889 Other specified local infections of the skin and subcutaneous tissue: Secondary | ICD-10-CM | POA: Diagnosis not present

## 2016-06-16 DIAGNOSIS — L97418 Non-pressure chronic ulcer of right heel and midfoot with other specified severity: Secondary | ICD-10-CM | POA: Diagnosis not present

## 2016-06-16 DIAGNOSIS — Z4800 Encounter for change or removal of nonsurgical wound dressing: Secondary | ICD-10-CM | POA: Diagnosis not present

## 2016-06-16 DIAGNOSIS — Z9181 History of falling: Secondary | ICD-10-CM | POA: Diagnosis not present

## 2016-06-16 DIAGNOSIS — Z87891 Personal history of nicotine dependence: Secondary | ICD-10-CM | POA: Diagnosis not present

## 2016-06-16 DIAGNOSIS — E43 Unspecified severe protein-calorie malnutrition: Secondary | ICD-10-CM | POA: Diagnosis not present

## 2016-06-16 DIAGNOSIS — I129 Hypertensive chronic kidney disease with stage 1 through stage 4 chronic kidney disease, or unspecified chronic kidney disease: Secondary | ICD-10-CM | POA: Diagnosis not present

## 2016-06-16 DIAGNOSIS — L97428 Non-pressure chronic ulcer of left heel and midfoot with other specified severity: Secondary | ICD-10-CM | POA: Diagnosis not present

## 2016-06-16 DIAGNOSIS — E1122 Type 2 diabetes mellitus with diabetic chronic kidney disease: Secondary | ICD-10-CM | POA: Diagnosis not present

## 2016-06-16 DIAGNOSIS — Z466 Encounter for fitting and adjustment of urinary device: Secondary | ICD-10-CM | POA: Diagnosis not present

## 2016-06-16 DIAGNOSIS — D869 Sarcoidosis, unspecified: Secondary | ICD-10-CM | POA: Diagnosis not present

## 2016-06-16 DIAGNOSIS — E11621 Type 2 diabetes mellitus with foot ulcer: Secondary | ICD-10-CM | POA: Diagnosis not present

## 2016-06-16 DIAGNOSIS — K589 Irritable bowel syndrome without diarrhea: Secondary | ICD-10-CM | POA: Diagnosis not present

## 2016-06-16 DIAGNOSIS — L89150 Pressure ulcer of sacral region, unstageable: Secondary | ICD-10-CM | POA: Diagnosis not present

## 2016-06-16 DIAGNOSIS — J45909 Unspecified asthma, uncomplicated: Secondary | ICD-10-CM | POA: Diagnosis not present

## 2016-06-19 DIAGNOSIS — L97428 Non-pressure chronic ulcer of left heel and midfoot with other specified severity: Secondary | ICD-10-CM | POA: Diagnosis not present

## 2016-06-19 DIAGNOSIS — L0889 Other specified local infections of the skin and subcutaneous tissue: Secondary | ICD-10-CM | POA: Diagnosis not present

## 2016-06-19 DIAGNOSIS — E11621 Type 2 diabetes mellitus with foot ulcer: Secondary | ICD-10-CM | POA: Diagnosis not present

## 2016-06-19 DIAGNOSIS — L97418 Non-pressure chronic ulcer of right heel and midfoot with other specified severity: Secondary | ICD-10-CM | POA: Diagnosis not present

## 2016-06-19 DIAGNOSIS — B965 Pseudomonas (aeruginosa) (mallei) (pseudomallei) as the cause of diseases classified elsewhere: Secondary | ICD-10-CM | POA: Diagnosis not present

## 2016-06-19 DIAGNOSIS — L89150 Pressure ulcer of sacral region, unstageable: Secondary | ICD-10-CM | POA: Diagnosis not present

## 2016-06-22 ENCOUNTER — Institutional Professional Consult (permissible substitution): Payer: Medicare Other | Admitting: Internal Medicine

## 2016-06-22 ENCOUNTER — Other Ambulatory Visit (HOSPITAL_COMMUNITY)
Admission: RE | Admit: 2016-06-22 | Discharge: 2016-06-22 | Disposition: A | Discharge status: Home / Self Care | Payer: Medicare Other | Attending: Internal Medicine | Admitting: Internal Medicine

## 2016-06-22 ENCOUNTER — Encounter (HOSPITAL_BASED_OUTPATIENT_CLINIC_OR_DEPARTMENT_OTHER): Payer: Medicare Other | Attending: Internal Medicine

## 2016-06-22 DIAGNOSIS — E1140 Type 2 diabetes mellitus with diabetic neuropathy, unspecified: Secondary | ICD-10-CM | POA: Insufficient documentation

## 2016-06-22 DIAGNOSIS — L89313 Pressure ulcer of right buttock, stage 3: Secondary | ICD-10-CM | POA: Diagnosis not present

## 2016-06-22 DIAGNOSIS — L89620 Pressure ulcer of left heel, unstageable: Secondary | ICD-10-CM | POA: Diagnosis present

## 2016-06-22 DIAGNOSIS — L89610 Pressure ulcer of right heel, unstageable: Secondary | ICD-10-CM | POA: Diagnosis not present

## 2016-06-22 DIAGNOSIS — F0390 Unspecified dementia without behavioral disturbance: Secondary | ICD-10-CM | POA: Diagnosis not present

## 2016-06-22 DIAGNOSIS — I10 Essential (primary) hypertension: Secondary | ICD-10-CM | POA: Diagnosis not present

## 2016-06-23 ENCOUNTER — Telehealth: Payer: Self-pay | Admitting: Family

## 2016-06-23 DIAGNOSIS — E11621 Type 2 diabetes mellitus with foot ulcer: Secondary | ICD-10-CM | POA: Diagnosis not present

## 2016-06-23 DIAGNOSIS — B965 Pseudomonas (aeruginosa) (mallei) (pseudomallei) as the cause of diseases classified elsewhere: Secondary | ICD-10-CM | POA: Diagnosis not present

## 2016-06-23 DIAGNOSIS — L97428 Non-pressure chronic ulcer of left heel and midfoot with other specified severity: Secondary | ICD-10-CM | POA: Diagnosis not present

## 2016-06-23 DIAGNOSIS — L89150 Pressure ulcer of sacral region, unstageable: Secondary | ICD-10-CM | POA: Diagnosis not present

## 2016-06-23 DIAGNOSIS — L0889 Other specified local infections of the skin and subcutaneous tissue: Secondary | ICD-10-CM | POA: Diagnosis not present

## 2016-06-23 DIAGNOSIS — L97418 Non-pressure chronic ulcer of right heel and midfoot with other specified severity: Secondary | ICD-10-CM | POA: Diagnosis not present

## 2016-06-23 NOTE — Telephone Encounter (Signed)
Called # below and Elizabeth Keller answered. He is not listed on pt's HIPPA. He tells me he was told I would be calling him. Advised him I would contact Elizabeth Keller at other # on file. Spoke with Elizabeth Keller, she states that while visiting her mother today she complained of back pain. Elizabeth Keller states that papers she received from pt's hospital discharge indicate back pain could be a result of pt's kidneys. I asked where on pt's back her pain is and she states that it is usually shoulders to middle of back and down to her lower back. Advised her that pt will probably need office visit to assess back pain / cause. She states we will need to contact Elizabeth Keller and she usually works from 1:30pm to 6pm of a day but may be off tomorrow since Elizabeth Keller will be closed. States she has not spoken to [employer] [facility] [employer] regarding this as she is not allowed to speak with her.  Please advise?

## 2016-06-23 NOTE — Telephone Encounter (Signed)
°  Relation to pt: self & Herminio HeadsLinda Moore Call back number: [phone] [phone] [place] [phone] [facility] [address] [address]  Reason for call:  Patient states when she was hospitalized she was informed whenever she's experiencing body pain/ache request Rx from PCP, please advise as per patient best # [phone].

## 2016-06-23 NOTE — Telephone Encounter (Signed)
Left message for pt's daughter to return my call. Needs to know if pt is residing with her now and what type of pain is pt having? Need further information to process request.

## 2016-06-24 ENCOUNTER — Telehealth: Payer: Self-pay | Admitting: *Deleted

## 2016-06-24 NOTE — Telephone Encounter (Signed)
Please contact [name] (HCPOA) and schedule office visit for the patient so we can further evaluate.

## 2016-06-24 NOTE — Telephone Encounter (Signed)
[name](HCPOA) contacted, patient scheduled for 06/30/16 at 11am.

## 2016-06-24 NOTE — Telephone Encounter (Signed)
[facility], forwarded to provider/SLS 05/16

## 2016-06-25 DIAGNOSIS — L97418 Non-pressure chronic ulcer of right heel and midfoot with other specified severity: Secondary | ICD-10-CM | POA: Diagnosis not present

## 2016-06-25 DIAGNOSIS — D869 Sarcoidosis, unspecified: Secondary | ICD-10-CM | POA: Diagnosis not present

## 2016-06-25 DIAGNOSIS — E11621 Type 2 diabetes mellitus with foot ulcer: Secondary | ICD-10-CM | POA: Diagnosis not present

## 2016-06-25 DIAGNOSIS — L97428 Non-pressure chronic ulcer of left heel and midfoot with other specified severity: Secondary | ICD-10-CM | POA: Diagnosis not present

## 2016-06-25 DIAGNOSIS — I509 Heart failure, unspecified: Secondary | ICD-10-CM | POA: Diagnosis not present

## 2016-06-25 DIAGNOSIS — L89150 Pressure ulcer of sacral region, unstageable: Secondary | ICD-10-CM | POA: Diagnosis not present

## 2016-06-25 DIAGNOSIS — I272 Pulmonary hypertension, unspecified: Secondary | ICD-10-CM | POA: Diagnosis not present

## 2016-06-25 DIAGNOSIS — B965 Pseudomonas (aeruginosa) (mallei) (pseudomallei) as the cause of diseases classified elsewhere: Secondary | ICD-10-CM | POA: Diagnosis not present

## 2016-06-25 DIAGNOSIS — L0889 Other specified local infections of the skin and subcutaneous tissue: Secondary | ICD-10-CM | POA: Diagnosis not present

## 2016-06-25 LAB — AEROBIC CULTURE  (SUPERFICIAL SPECIMEN): CULTURE: NORMAL

## 2016-06-26 ENCOUNTER — Emergency Department (HOSPITAL_BASED_OUTPATIENT_CLINIC_OR_DEPARTMENT_OTHER): Payer: Medicare Other

## 2016-06-26 ENCOUNTER — Encounter (HOSPITAL_BASED_OUTPATIENT_CLINIC_OR_DEPARTMENT_OTHER): Payer: Self-pay | Admitting: Emergency Medicine

## 2016-06-26 ENCOUNTER — Telehealth: Payer: Self-pay | Admitting: *Deleted

## 2016-06-26 ENCOUNTER — Other Ambulatory Visit: Payer: Self-pay | Admitting: Family

## 2016-06-26 ENCOUNTER — Emergency Department (HOSPITAL_BASED_OUTPATIENT_CLINIC_OR_DEPARTMENT_OTHER)
Admission: EM | Admit: 2016-06-26 | Discharge: 2016-06-26 | Disposition: A | Discharge status: Home / Self Care | Payer: Medicare Other | Attending: Emergency Medicine | Admitting: Emergency Medicine

## 2016-06-26 DIAGNOSIS — J45909 Unspecified asthma, uncomplicated: Secondary | ICD-10-CM | POA: Insufficient documentation

## 2016-06-26 DIAGNOSIS — I10 Essential (primary) hypertension: Secondary | ICD-10-CM | POA: Insufficient documentation

## 2016-06-26 DIAGNOSIS — M25511 Pain in right shoulder: Secondary | ICD-10-CM | POA: Diagnosis not present

## 2016-06-26 DIAGNOSIS — Z87891 Personal history of nicotine dependence: Secondary | ICD-10-CM | POA: Diagnosis not present

## 2016-06-26 DIAGNOSIS — Z7982 Long term (current) use of aspirin: Secondary | ICD-10-CM | POA: Diagnosis not present

## 2016-06-26 DIAGNOSIS — D631 Anemia in chronic kidney disease: Secondary | ICD-10-CM | POA: Diagnosis not present

## 2016-06-26 DIAGNOSIS — E119 Type 2 diabetes mellitus without complications: Secondary | ICD-10-CM | POA: Insufficient documentation

## 2016-06-26 DIAGNOSIS — Z7984 Long term (current) use of oral hypoglycemic drugs: Secondary | ICD-10-CM | POA: Insufficient documentation

## 2016-06-26 DIAGNOSIS — N183 Chronic kidney disease, stage 3 (moderate): Secondary | ICD-10-CM | POA: Diagnosis not present

## 2016-06-26 DIAGNOSIS — R531 Weakness: Secondary | ICD-10-CM | POA: Diagnosis present

## 2016-06-26 LAB — CBG MONITORING, ED: Glucose-Capillary: 167 mg/dL — ABNORMAL HIGH (ref 65–99)

## 2016-06-26 MED ORDER — HYDROCODONE-ACETAMINOPHEN 5-325 MG PO TABS: 1.0000 | ORAL_TABLET | Freq: Four times a day (QID) | ORAL | 0 refills | Status: DC | PRN

## 2016-06-26 MED ORDER — HYDROCODONE-ACETAMINOPHEN 5-325 MG PO TABS
1.0000 | ORAL_TABLET | Freq: Once | ORAL | Status: AC
  Administered 2016-06-26: 1 via ORAL
  Filled 2016-06-26: qty 1

## 2016-06-26 MED FILL — HYDROCODON-APAP 5-325: 5-325 | 5 days supply | Qty: 20 | Fill #0

## 2016-06-26 NOTE — ED Triage Notes (Signed)
Pt went to kidney doctor this morning and told to come to ED for IV fluids due to low BP. Pt reports feeling "bad", having "stomach troubles".

## 2016-06-26 NOTE — Discharge Instructions (Signed)
Blood pressures been stable here no problems. X-rays of the right shoulder showed a lot of degenerative changes mostly due to age. No acute injuries to the shoulder. However it would explain the pain. Take pain medicine as directed follow-up with sports medicine. Follow-up with her doctor as needed.

## 2016-06-26 NOTE — ED Notes (Signed)
Pt had foley cath inserted 3 weeks ago due to difficulty voiding.

## 2016-06-26 NOTE — ED Notes (Signed)
Patient transported to X-ray 

## 2016-06-26 NOTE — ED Notes (Signed)
Pt on automatic VS. Pt c/o pain and daughter questions about getting an IV placed. RN made aware of this.

## 2016-06-26 NOTE — ED Notes (Signed)
Pt teaching provided on medications that may cause drowsiness. Pt instructed not to drive or operate heavy machinery while taking the prescribed medication. Pt verbalized understanding.   

## 2016-06-26 NOTE — Telephone Encounter (Signed)
Erica with [facility] called requesting patient's last Lab Results ordered by PCP; informed her that pt had more current labs from hospital but we are unable to forward those results, she understood and agreed to contact hospital. Requested results faxed; [facility], if necessary/SLS 05/18

## 2016-06-26 NOTE — ED Provider Notes (Signed)
[facility] Note   CSN: [id number] Arrival date & time: 06/26/16  1020     History   Chief Complaint Chief Complaint  Patient presents with  . Weakness    HPI Elizabeth Keller is a 83 y.o. female.  Marland Kitchen Upon arrival here initial blood pressure was low but the systolic and diastolic numbers were very close and probably one of them is false. All blood pressure since that time have been normal. Patient's main complaint was right shoulder pain. Apparently present for several days. No fall or injury. Patient wants something for pain for the shoulder.      Past Medical History:  Diagnosis Date  . Arthritis   . Asthma   . Degenerative disk disease   . Diabetes mellitus   . GERD (gastroesophageal reflux disease)   . Hypercholesteremia   . Hypertension   . IBS (irritable bowel syndrome)   . Sarcoidosis   . Spinal stenosis     Patient Active Problem List   Diagnosis Date Noted  . Severe sepsis (HCC) 05/15/2016  . Pressure ulcer of unspecified buttock, stage 2 04/04/2016  . Generalized abdominal pain 04/04/2016  . DM2 (diabetes mellitus, type 2) (HCC) 04/04/2016  . Hyperkalemia 04/03/2016  . Chronic kidney disease, stage III (moderate) 03/14/2016    Past Surgical History:  Procedure Laterality Date  . EYE SURGERY  2017   cataract / glaucoma    OB History    No data available       Home Medications    Prior to Admission medications   Medication Sig Start Date End Date Taking? Authorizing Provider  acidophilus (RISAQUAD) CAPS capsule Take 1 capsule by mouth daily.    [provider]  albuterol (PROVENTIL HFA;VENTOLIN HFA) 108 (90 Base) MCG/ACT inhaler Inhale 1-2 puffs into the lungs every 6 (six) hours as needed for wheezing or shortness of breath.     [provider]  albuterol (PROVENTIL) (2.5 MG/3ML) 0.083% nebulizer solution Take 2.5 mg by nebulization every 6 (six) hours as needed for wheezing or shortness of breath.     [provider]  aspirin EC 81 MG tablet Take 81 mg by mouth daily.    [provider]  bisacodyl (DULCOLAX) 10 MG suppository Place 1 suppository (10 mg total) rectally daily as needed for moderate constipation. 05/20/16   Marguerita Merles Latif, DO  cholecalciferol (VITAMIN D) 1000 units tablet Take 1,000 Units by mouth daily.    [provider]  clonazePAM (KLONOPIN) 0.5 MG tablet TAKE 1/2 TABLET BY MOUTH DAILY IN THE MORNING, 1 TABLET AT 2PM, MAY TAKE 1/2 TABLET BETWEEN MORNING AND 2PM, THEN DELAY 2PM DOSE TO 7PM 05/11/16   Wendling, Jilda Roche, DO  collagenase (SANTYL) ointment Apply 1 application topically daily. Pt applies to wound on back.    [provider]  diclofenac sodium (VOLTAREN) 1 % GEL Apply 2 g topically 4 (four) times daily as needed (for pain). Pt applies to back. 05/26/16   Sandford Craze, NP  feeding supplement, ENSURE ENLIVE, (ENSURE ENLIVE) LIQD Take 237 mLs by mouth 3 (three) times daily between meals. 05/20/16   Marguerita Merles Latif, DO  HYDROcodone-acetaminophen (NORCO/VICODIN) 5-325 MG tablet Take 1 tablet by mouth every 6 (six) hours as needed for moderate pain. 06/26/16   Vanetta Mulders, MD  megestrol (MEGACE) 20 MG tablet Take 1 tablet (20 mg total) by mouth daily. 04/03/16   Sandford Craze, NP  metFORMIN (GLUCOPHAGE) 500 MG tablet Take 500 mg by mouth  daily with breakfast.     [provider]  Multiple Vitamin (MULTIVITAMIN WITH MINERALS) TABS tablet Take 1 tablet by mouth daily. 05/21/16   Marguerita MerlesSheikh, Omair Latif, DO  omeprazole (PRILOSEC) 40 MG capsule Take 40 mg by mouth daily.     [provider]  simvastatin (ZOCOR) 40 MG tablet Take 40 mg by mouth at bedtime.     [provider]  torsemide (DEMADEX) 5 MG tablet Take 5 mg by mouth daily.    [provider]    Family History Family History  Problem Relation Age of Onset  . Hypertension Other     Social History Social History  Substance  Use Topics  . Smoking status: Former Games developermoker  . Smokeless tobacco: Never Used  . Alcohol use No     Allergies   Dicyclomine; Gabapentin; Metformin and related; Lyrica [pregabalin]; and Topiramate   Review of Systems Review of Systems  Constitutional: Negative for fever.  HENT: Negative for congestion.   Eyes: Negative for redness.  Respiratory: Negative for shortness of breath.   Cardiovascular: Negative for chest pain.  Gastrointestinal: Negative for abdominal pain.  Genitourinary: Negative for hematuria.  Musculoskeletal: Positive for arthralgias. Negative for back pain and neck pain.  Skin: Negative for rash.  Neurological: Negative for syncope.  Hematological: Does not bruise/bleed easily.     Physical Exam Updated Vital Signs BP (!) 133/94   Pulse 93   Temp 99 F (37.2 C) (Rectal)   Resp 18   SpO2 100%   Physical Exam  Constitutional: She appears well-developed and well-nourished.  HENT:  Head: Normocephalic and atraumatic.  Mouth/Throat: Oropharynx is clear and moist.  Eyes: EOM are normal. Pupils are equal, round, and reactive to light.  Neck: Normal range of motion. Neck supple.  Cardiovascular: Normal rate, regular rhythm and normal heart sounds.   Pulmonary/Chest: Effort normal and breath sounds normal. No respiratory distress.  Abdominal: Soft. Bowel sounds are normal. There is no tenderness.  Foley catheter in place.  Musculoskeletal:  Pain with range of motion of the right shoulder. Distally radial pulses 2+. No pain at the hand wrist or elbow.  Neurological: She is alert. No cranial nerve deficit or sensory deficit. She exhibits normal muscle tone. Coordination normal.  Skin: Skin is warm.  Nursing note and vitals reviewed.    ED Treatments / Results  Labs (all labs ordered are listed, but only abnormal results are displayed) Labs Reviewed  CBG MONITORING, ED - Abnormal; Notable for the following:       Result Value   Glucose-Capillary 167 (*)     All other components within normal limits    EKG  EKG Interpretation  Date/Time:  Friday Jun 26 2016 10:45:41 EDT Ventricular Rate:  96 PR Interval:    QRS Duration: 147 QT Interval:  393 QTC Calculation: 497 R Axis:   -80 Text Interpretation:  Sinus rhythm Multiform ventricular premature complexes Right atrial enlargement Right bundle branch block LVH with IVCD and secondary repol abnrm Inferior infarct, old Artifact in lead(s) I II III aVR aVL aVF V1 V2 V3 V4 V6 and baseline wander in lead(s) II III aVF Interpretation limited secondary to artifact Confirmed by Vanetta MuldersZackowski, Shatana Saxton [phone]([id number]) on 06/26/2016 12:24:51 PM       Radiology Dg Shoulder Right  Result Date: 06/26/2016 CLINICAL DATA:  Chronic right shoulder pain, decreased range of motion EXAM: RIGHT SHOULDER - 2+ VIEW COMPARISON:  05/15/2016 chest x-ray FINDINGS: Advanced right shoulder degenerative arthropathy with joint space  loss, sclerosis and humeral head bony spurring. Mild AC joint degenerative changes well. Bones are osteopenic. No gross malalignment or fracture. Right upper lobe chronic reticulonodular opacity compatible with history of sarcoid. IMPRESSION: Right shoulder arthropathy without acute process Osteopenia Electronically Signed   By: Judie Petit.  Shick M.D.   On: 06/26/2016 12:25    Procedures Procedures (including critical care time)  Medications Ordered in ED Medications  HYDROcodone-acetaminophen (NORCO/VICODIN) 5-325 MG per tablet 1 tablet (1 tablet Oral Given 06/26/16 1148)     Initial Impression / Assessment and Plan / ED Course  I have reviewed the triage vital signs and the nursing notes.  Pertinent labs & imaging results that were available during my care of the patient were reviewed by me and considered in my medical decision making (see chart for details).      Patient referred in from nephrology office for low blood pressure. However blood pressure has been fine here for several readings. There was  one initial low reading of 78/58 which actually sounds as if it's not accurate. Since that time blood pressures in the 135/76 119/72 133/94.  That his main complaint here was right shoulder pain. According to her grandson of this is been bothering her now for several days.  Patient wanted the right shoulder evaluated and 1 and pain medicine for it. Patient just been taking Tylenol at home. No fall or injury.   Workup shows significant degenerative changes to the right shoulder. Blood pressures remained stable as stated above. Patient treated here with hydrocodone with some improvement in the pain. Will be continued on hydrocodone will follow-up sports medicine. No other acute concerns.  Blood sugar slightly elevated here. Patient alert no acute distress.  Final Clinical Impressions(s) / ED Diagnoses   Final diagnoses:  Acute pain of right shoulder    New Prescriptions New Prescriptions   HYDROCODONE-ACETAMINOPHEN (NORCO/VICODIN) 5-325 MG TABLET    Take 1 tablet by mouth every 6 (six) hours as needed for moderate pain.     Vanetta Mulders, MD 06/26/16 1251

## 2016-06-29 ENCOUNTER — Inpatient Hospital Stay (HOSPITAL_COMMUNITY)
Admission: EM | Admit: 2016-06-29 | Discharge: 2016-07-03 | DRG: 871 | Disposition: A | Discharge status: Skilled Nursing Facility | Payer: Medicare Other | Attending: Internal Medicine | Admitting: Internal Medicine

## 2016-06-29 ENCOUNTER — Emergency Department (HOSPITAL_COMMUNITY): Payer: Medicare Other

## 2016-06-29 ENCOUNTER — Encounter (HOSPITAL_COMMUNITY): Payer: Self-pay | Admitting: Emergency Medicine

## 2016-06-29 DIAGNOSIS — I950 Idiopathic hypotension: Secondary | ICD-10-CM

## 2016-06-29 DIAGNOSIS — Z7401 Bed confinement status: Secondary | ICD-10-CM

## 2016-06-29 DIAGNOSIS — R141 Gas pain: Secondary | ICD-10-CM | POA: Diagnosis not present

## 2016-06-29 DIAGNOSIS — E43 Unspecified severe protein-calorie malnutrition: Secondary | ICD-10-CM | POA: Diagnosis not present

## 2016-06-29 DIAGNOSIS — A419 Sepsis, unspecified organism: Secondary | ICD-10-CM

## 2016-06-29 DIAGNOSIS — L97418 Non-pressure chronic ulcer of right heel and midfoot with other specified severity: Secondary | ICD-10-CM | POA: Diagnosis not present

## 2016-06-29 DIAGNOSIS — K219 Gastro-esophageal reflux disease without esophagitis: Secondary | ICD-10-CM | POA: Diagnosis present

## 2016-06-29 DIAGNOSIS — L89609 Pressure ulcer of unspecified heel, unspecified stage: Secondary | ICD-10-CM | POA: Diagnosis present

## 2016-06-29 DIAGNOSIS — N183 Chronic kidney disease, stage 3 unspecified: Secondary | ICD-10-CM | POA: Diagnosis present

## 2016-06-29 DIAGNOSIS — R404 Transient alteration of awareness: Secondary | ICD-10-CM | POA: Diagnosis not present

## 2016-06-29 DIAGNOSIS — D869 Sarcoidosis, unspecified: Secondary | ICD-10-CM | POA: Diagnosis present

## 2016-06-29 DIAGNOSIS — G9340 Encephalopathy, unspecified: Secondary | ICD-10-CM | POA: Diagnosis not present

## 2016-06-29 DIAGNOSIS — Z792 Long term (current) use of antibiotics: Secondary | ICD-10-CM

## 2016-06-29 DIAGNOSIS — L89309 Pressure ulcer of unspecified buttock, unspecified stage: Secondary | ICD-10-CM | POA: Diagnosis present

## 2016-06-29 DIAGNOSIS — R079 Chest pain, unspecified: Secondary | ICD-10-CM | POA: Diagnosis not present

## 2016-06-29 DIAGNOSIS — B965 Pseudomonas (aeruginosa) (mallei) (pseudomallei) as the cause of diseases classified elsewhere: Secondary | ICD-10-CM | POA: Diagnosis not present

## 2016-06-29 DIAGNOSIS — L89154 Pressure ulcer of sacral region, stage 4: Secondary | ICD-10-CM | POA: Diagnosis not present

## 2016-06-29 DIAGNOSIS — R109 Unspecified abdominal pain: Secondary | ICD-10-CM | POA: Diagnosis not present

## 2016-06-29 DIAGNOSIS — E872 Acidosis: Secondary | ICD-10-CM | POA: Diagnosis present

## 2016-06-29 DIAGNOSIS — Z7982 Long term (current) use of aspirin: Secondary | ICD-10-CM

## 2016-06-29 DIAGNOSIS — Z888 Allergy status to other drugs, medicaments and biological substances status: Secondary | ICD-10-CM

## 2016-06-29 DIAGNOSIS — N342 Other urethritis: Secondary | ICD-10-CM | POA: Diagnosis not present

## 2016-06-29 DIAGNOSIS — F0390 Unspecified dementia without behavioral disturbance: Secondary | ICD-10-CM | POA: Diagnosis present

## 2016-06-29 DIAGNOSIS — Z9109 Other allergy status, other than to drugs and biological substances: Secondary | ICD-10-CM

## 2016-06-29 DIAGNOSIS — I5032 Chronic diastolic (congestive) heart failure: Secondary | ICD-10-CM | POA: Diagnosis present

## 2016-06-29 DIAGNOSIS — Z681 Body mass index (BMI) 19 or less, adult: Secondary | ICD-10-CM

## 2016-06-29 DIAGNOSIS — I130 Hypertensive heart and chronic kidney disease with heart failure and stage 1 through stage 4 chronic kidney disease, or unspecified chronic kidney disease: Secondary | ICD-10-CM | POA: Diagnosis not present

## 2016-06-29 DIAGNOSIS — L97428 Non-pressure chronic ulcer of left heel and midfoot with other specified severity: Secondary | ICD-10-CM | POA: Diagnosis not present

## 2016-06-29 DIAGNOSIS — Z79899 Other long term (current) drug therapy: Secondary | ICD-10-CM

## 2016-06-29 DIAGNOSIS — R531 Weakness: Secondary | ICD-10-CM | POA: Diagnosis not present

## 2016-06-29 DIAGNOSIS — E1129 Type 2 diabetes mellitus with other diabetic kidney complication: Secondary | ICD-10-CM

## 2016-06-29 DIAGNOSIS — L0889 Other specified local infections of the skin and subcutaneous tissue: Secondary | ICD-10-CM | POA: Diagnosis not present

## 2016-06-29 DIAGNOSIS — E785 Hyperlipidemia, unspecified: Secondary | ICD-10-CM | POA: Diagnosis present

## 2016-06-29 DIAGNOSIS — E11621 Type 2 diabetes mellitus with foot ulcer: Secondary | ICD-10-CM | POA: Diagnosis not present

## 2016-06-29 DIAGNOSIS — L89150 Pressure ulcer of sacral region, unstageable: Secondary | ICD-10-CM | POA: Diagnosis not present

## 2016-06-29 DIAGNOSIS — D649 Anemia, unspecified: Secondary | ICD-10-CM | POA: Diagnosis present

## 2016-06-29 DIAGNOSIS — L8992 Pressure ulcer of unspecified site, stage 2: Secondary | ICD-10-CM | POA: Diagnosis present

## 2016-06-29 DIAGNOSIS — K589 Irritable bowel syndrome without diarrhea: Secondary | ICD-10-CM | POA: Diagnosis present

## 2016-06-29 DIAGNOSIS — E1122 Type 2 diabetes mellitus with diabetic chronic kidney disease: Secondary | ICD-10-CM | POA: Diagnosis present

## 2016-06-29 DIAGNOSIS — L89302 Pressure ulcer of unspecified buttock, stage 2: Secondary | ICD-10-CM | POA: Diagnosis present

## 2016-06-29 HISTORY — DX: Sepsis, unspecified organism: A41.9

## 2016-06-29 LAB — CBC WITH DIFFERENTIAL/PLATELET
BASOS ABS: 0.1 10*3/uL (ref 0.0–0.1)
BASOS PCT: 1 %
EOS ABS: 0.9 10*3/uL — AB (ref 0.0–0.7)
EOS PCT: 13 %
HCT: 31.7 % — ABNORMAL LOW (ref 36.0–46.0)
Hemoglobin: 10.3 g/dL — ABNORMAL LOW (ref 12.0–15.0)
LYMPHS PCT: 17 %
Lymphs Abs: 1.2 10*3/uL (ref 0.7–4.0)
MCH: 28.9 pg (ref 26.0–34.0)
MCHC: 32.5 g/dL (ref 30.0–36.0)
MCV: 89 fL (ref 78.0–100.0)
MONO ABS: 0.8 10*3/uL (ref 0.1–1.0)
Monocytes Relative: 11 %
Neutro Abs: 4 10*3/uL (ref 1.7–7.7)
Neutrophils Relative %: 58 %
PLATELETS: 272 10*3/uL (ref 150–400)
RBC: 3.56 MIL/uL — AB (ref 3.87–5.11)
RDW: 18.5 % — ABNORMAL HIGH (ref 11.5–15.5)
WBC: 7 10*3/uL (ref 4.0–10.5)

## 2016-06-29 LAB — URINALYSIS, ROUTINE W REFLEX MICROSCOPIC
GLUCOSE, UA: NEGATIVE mg/dL
KETONES UR: 20 mg/dL — AB
Nitrite: NEGATIVE
PH: 5 (ref 5.0–8.0)
PROTEIN: 30 mg/dL — AB
Specific Gravity, Urine: 1.036 — ABNORMAL HIGH (ref 1.005–1.030)

## 2016-06-29 LAB — COMPREHENSIVE METABOLIC PANEL
ALBUMIN: 3 g/dL — AB (ref 3.5–5.0)
ALT: 7 U/L — AB (ref 14–54)
AST: 16 U/L (ref 15–41)
Alkaline Phosphatase: 57 U/L (ref 38–126)
Anion gap: 11 (ref 5–15)
BUN: 21 mg/dL — AB (ref 6–20)
CHLORIDE: 106 mmol/L (ref 101–111)
CO2: 22 mmol/L (ref 22–32)
CREATININE: 0.79 mg/dL (ref 0.44–1.00)
Calcium: 9.4 mg/dL (ref 8.9–10.3)
GFR calc Af Amer: >60 mL/min (ref >60)
GFR calc non Af Amer: >60 mL/min (ref >60)
Glucose, Bld: 112 mg/dL — ABNORMAL HIGH (ref 65–99)
POTASSIUM: 3.9 mmol/L (ref 3.5–5.1)
SODIUM: 139 mmol/L (ref 135–145)
Total Bilirubin: 0.5 mg/dL (ref 0.3–1.2)
Total Protein: 6.4 g/dL — ABNORMAL LOW (ref 6.5–8.1)

## 2016-06-29 LAB — I-STAT CG4 LACTIC ACID, ED: LACTIC ACID, VENOUS: 1 mmol/L (ref 0.5–1.9)

## 2016-06-29 MED ORDER — SODIUM CHLORIDE 0.9 % IV BOLUS (SEPSIS)
1000.0000 mL | Freq: Once | INTRAVENOUS | Status: AC
  Administered 2016-06-29: 1000 mL via INTRAVENOUS

## 2016-06-29 MED ORDER — FLEET ENEMA 7-19 GM/118ML RE ENEM: 1.0000 | ENEMA | Freq: Once | RECTAL | Status: DC

## 2016-06-29 MED ORDER — FENTANYL CITRATE (PF) 100 MCG/2ML IJ SOLN
12.5000 ug | Freq: Once | INTRAMUSCULAR | Status: AC
  Administered 2016-06-29: 12.5 ug via INTRAVENOUS
  Filled 2016-06-29: qty 2

## 2016-06-29 MED ORDER — DEXTROSE 5 % IV SOLN
1.0000 g | Freq: Once | INTRAVENOUS | Status: AC
  Administered 2016-06-29: 1 g via INTRAVENOUS
  Filled 2016-06-29: qty 10

## 2016-06-29 MED ORDER — CLONAZEPAM 0.5 MG PO TABS
0.5000 mg | ORAL_TABLET | Freq: Once | ORAL | Status: AC
  Administered 2016-06-29: 0.5 mg via ORAL
  Filled 2016-06-29: qty 1
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
## 2016-06-29 NOTE — ED Notes (Signed)
Bed: WA02 Expected date:  Expected time:  Means of arrival:  Comments: 
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
## 2016-06-29 NOTE — ED Notes (Signed)
Unable to get second set of blood cultures.

## 2016-06-29 NOTE — ED Notes (Signed)
Gave report to Electronic Data Systems[employer] RN for room 1406.

## 2016-06-29 NOTE — ED Triage Notes (Signed)
Per [facility] pt from home c/o pain all over. Mostly to right shoulder. Onset of 5 days ago. Pt adds hip pain, will not specify which hip. Pt is bed bound. Foley catheter in place.

## 2016-06-29 NOTE — ED Notes (Signed)
Dr. Silverio LayYao aware that one set of blood cultures have been obtained and unable to get second set.

## 2016-06-30 ENCOUNTER — Telehealth: Payer: Self-pay | Admitting: Family

## 2016-06-30 ENCOUNTER — Encounter (HOSPITAL_COMMUNITY): Payer: Self-pay | Admitting: Internal Medicine

## 2016-06-30 ENCOUNTER — Ambulatory Visit: Payer: Medicare Other | Admitting: Family

## 2016-06-30 DIAGNOSIS — Z7982 Long term (current) use of aspirin: Secondary | ICD-10-CM | POA: Diagnosis not present

## 2016-06-30 DIAGNOSIS — G9341 Metabolic encephalopathy: Secondary | ICD-10-CM | POA: Diagnosis not present

## 2016-06-30 DIAGNOSIS — D869 Sarcoidosis, unspecified: Secondary | ICD-10-CM | POA: Diagnosis present

## 2016-06-30 DIAGNOSIS — R1311 Dysphagia, oral phase: Secondary | ICD-10-CM | POA: Diagnosis not present

## 2016-06-30 DIAGNOSIS — M109 Gout, unspecified: Secondary | ICD-10-CM | POA: Diagnosis not present

## 2016-06-30 DIAGNOSIS — Z792 Long term (current) use of antibiotics: Secondary | ICD-10-CM | POA: Diagnosis not present

## 2016-06-30 DIAGNOSIS — E1122 Type 2 diabetes mellitus with diabetic chronic kidney disease: Secondary | ICD-10-CM | POA: Diagnosis present

## 2016-06-30 DIAGNOSIS — T83511A Infection and inflammatory reaction due to indwelling urethral catheter, initial encounter: Secondary | ICD-10-CM | POA: Diagnosis not present

## 2016-06-30 DIAGNOSIS — R6520 Severe sepsis without septic shock: Secondary | ICD-10-CM | POA: Diagnosis not present

## 2016-06-30 DIAGNOSIS — Z960 Presence of urogenital implants: Secondary | ICD-10-CM

## 2016-06-30 DIAGNOSIS — E119 Type 2 diabetes mellitus without complications: Secondary | ICD-10-CM | POA: Diagnosis not present

## 2016-06-30 DIAGNOSIS — L89609 Pressure ulcer of unspecified heel, unspecified stage: Secondary | ICD-10-CM | POA: Diagnosis present

## 2016-06-30 DIAGNOSIS — E872 Acidosis: Secondary | ICD-10-CM | POA: Diagnosis present

## 2016-06-30 DIAGNOSIS — Z7189 Other specified counseling: Secondary | ICD-10-CM | POA: Diagnosis not present

## 2016-06-30 DIAGNOSIS — M8580 Other specified disorders of bone density and structure, unspecified site: Secondary | ICD-10-CM | POA: Diagnosis not present

## 2016-06-30 DIAGNOSIS — F419 Anxiety disorder, unspecified: Secondary | ICD-10-CM | POA: Diagnosis not present

## 2016-06-30 DIAGNOSIS — K219 Gastro-esophageal reflux disease without esophagitis: Secondary | ICD-10-CM | POA: Diagnosis present

## 2016-06-30 DIAGNOSIS — A419 Sepsis, unspecified organism: Principal | ICD-10-CM

## 2016-06-30 DIAGNOSIS — E118 Type 2 diabetes mellitus with unspecified complications: Secondary | ICD-10-CM | POA: Diagnosis not present

## 2016-06-30 DIAGNOSIS — Z79899 Other long term (current) drug therapy: Secondary | ICD-10-CM | POA: Diagnosis not present

## 2016-06-30 DIAGNOSIS — N183 Chronic kidney disease, stage 3 (moderate): Secondary | ICD-10-CM | POA: Diagnosis present

## 2016-06-30 DIAGNOSIS — Z7401 Bed confinement status: Secondary | ICD-10-CM | POA: Diagnosis not present

## 2016-06-30 DIAGNOSIS — I5032 Chronic diastolic (congestive) heart failure: Secondary | ICD-10-CM | POA: Diagnosis present

## 2016-06-30 DIAGNOSIS — I950 Idiopathic hypotension: Secondary | ICD-10-CM | POA: Diagnosis not present

## 2016-06-30 DIAGNOSIS — I130 Hypertensive heart and chronic kidney disease with heart failure and stage 1 through stage 4 chronic kidney disease, or unspecified chronic kidney disease: Secondary | ICD-10-CM | POA: Diagnosis present

## 2016-06-30 DIAGNOSIS — E43 Unspecified severe protein-calorie malnutrition: Secondary | ICD-10-CM | POA: Insufficient documentation

## 2016-06-30 DIAGNOSIS — Z515 Encounter for palliative care: Secondary | ICD-10-CM | POA: Diagnosis not present

## 2016-06-30 DIAGNOSIS — L8992 Pressure ulcer of unspecified site, stage 2: Secondary | ICD-10-CM | POA: Diagnosis present

## 2016-06-30 DIAGNOSIS — G9340 Encephalopathy, unspecified: Secondary | ICD-10-CM | POA: Diagnosis present

## 2016-06-30 DIAGNOSIS — Z681 Body mass index (BMI) 19 or less, adult: Secondary | ICD-10-CM | POA: Diagnosis not present

## 2016-06-30 DIAGNOSIS — R451 Restlessness and agitation: Secondary | ICD-10-CM | POA: Diagnosis not present

## 2016-06-30 DIAGNOSIS — Z9109 Other allergy status, other than to drugs and biological substances: Secondary | ICD-10-CM | POA: Diagnosis not present

## 2016-06-30 DIAGNOSIS — L89302 Pressure ulcer of unspecified buttock, stage 2: Secondary | ICD-10-CM | POA: Diagnosis not present

## 2016-06-30 DIAGNOSIS — Z888 Allergy status to other drugs, medicaments and biological substances status: Secondary | ICD-10-CM | POA: Diagnosis not present

## 2016-06-30 DIAGNOSIS — R4182 Altered mental status, unspecified: Secondary | ICD-10-CM | POA: Diagnosis not present

## 2016-06-30 DIAGNOSIS — N342 Other urethritis: Secondary | ICD-10-CM | POA: Diagnosis present

## 2016-06-30 DIAGNOSIS — N390 Urinary tract infection, site not specified: Secondary | ICD-10-CM

## 2016-06-30 DIAGNOSIS — D649 Anemia, unspecified: Secondary | ICD-10-CM | POA: Diagnosis present

## 2016-06-30 DIAGNOSIS — R6521 Severe sepsis with septic shock: Secondary | ICD-10-CM

## 2016-06-30 DIAGNOSIS — M6281 Muscle weakness (generalized): Secondary | ICD-10-CM | POA: Diagnosis not present

## 2016-06-30 DIAGNOSIS — L89154 Pressure ulcer of sacral region, stage 4: Secondary | ICD-10-CM | POA: Diagnosis present

## 2016-06-30 DIAGNOSIS — L89309 Pressure ulcer of unspecified buttock, unspecified stage: Secondary | ICD-10-CM | POA: Diagnosis present

## 2016-06-30 DIAGNOSIS — R531 Weakness: Secondary | ICD-10-CM | POA: Diagnosis not present

## 2016-06-30 DIAGNOSIS — E785 Hyperlipidemia, unspecified: Secondary | ICD-10-CM | POA: Diagnosis present

## 2016-06-30 LAB — BASIC METABOLIC PANEL
ANION GAP: 12 (ref 5–15)
BUN: 22 mg/dL — ABNORMAL HIGH (ref 6–20)
CALCIUM: 8.7 mg/dL — AB (ref 8.9–10.3)
CO2: 18 mmol/L — AB (ref 22–32)
Chloride: 112 mmol/L — ABNORMAL HIGH (ref 101–111)
Creatinine, Ser: 0.83 mg/dL (ref 0.44–1.00)
Glucose, Bld: 104 mg/dL — ABNORMAL HIGH (ref 65–99)
Potassium: 3.7 mmol/L (ref 3.5–5.1)
Sodium: 142 mmol/L (ref 135–145)

## 2016-06-30 LAB — CBC
HCT: 32.7 % — ABNORMAL LOW (ref 36.0–46.0)
Hemoglobin: 10.3 g/dL — ABNORMAL LOW (ref 12.0–15.0)
MCH: 28.9 pg (ref 26.0–34.0)
MCHC: 31.5 g/dL (ref 30.0–36.0)
MCV: 91.6 fL (ref 78.0–100.0)
Platelets: 218 10*3/uL (ref 150–400)
RBC: 3.57 MIL/uL — AB (ref 3.87–5.11)
RDW: 18.7 % — ABNORMAL HIGH (ref 11.5–15.5)
WBC: 13.6 10*3/uL — ABNORMAL HIGH (ref 4.0–10.5)

## 2016-06-30 LAB — TROPONIN I: Troponin I: <0.03 ng/mL (ref <0.03)

## 2016-06-30 LAB — HEPATIC FUNCTION PANEL
ALBUMIN: 2.9 g/dL — AB (ref 3.5–5.0)
ALT: 7 U/L — AB (ref 14–54)
AST: 19 U/L (ref 15–41)
Alkaline Phosphatase: 55 U/L (ref 38–126)
Bilirubin, Direct: 0.2 mg/dL (ref 0.1–0.5)
Indirect Bilirubin: 0.8 mg/dL (ref 0.3–0.9)
TOTAL PROTEIN: 5.9 g/dL — AB (ref 6.5–8.1)
Total Bilirubin: 1 mg/dL (ref 0.3–1.2)

## 2016-06-30 LAB — GLUCOSE, CAPILLARY
GLUCOSE-CAPILLARY: 192 mg/dL — AB (ref 65–99)
GLUCOSE-CAPILLARY: 157 mg/dL — AB (ref 65–99)
GLUCOSE-CAPILLARY: 163 mg/dL — AB (ref 65–99)
Glucose-Capillary: 119 mg/dL — ABNORMAL HIGH (ref 65–99)

## 2016-06-30 LAB — MAGNESIUM: Magnesium: 1.6 mg/dL — ABNORMAL LOW (ref 1.7–2.4)

## 2016-06-30 LAB — LACTIC ACID, PLASMA
Lactic Acid, Venous: 2.4 mmol/L (ref 0.5–1.9)
Lactic Acid, Venous: 1.3 mmol/L (ref 0.5–1.9)
Lactic Acid, Venous: 1.4 mmol/L (ref 0.5–1.9)

## 2016-06-30 LAB — APTT: aPTT: 23 seconds — ABNORMAL LOW (ref 24–36)

## 2016-06-30 LAB — PROTIME-INR
INR: 1.31
Prothrombin Time: 16.4 seconds — ABNORMAL HIGH (ref 11.4–15.2)

## 2016-06-30 LAB — CORTISOL: CORTISOL PLASMA: 22.7 ug/dL

## 2016-06-30 LAB — MRSA PCR SCREENING: MRSA by PCR: NEGATIVE

## 2016-06-30 LAB — PROCALCITONIN: Procalcitonin: 2.27 ng/mL

## 2016-06-30 MED ORDER — SIMVASTATIN 40 MG PO TABS
40.0000 mg | ORAL_TABLET | Freq: Every day | ORAL | Status: DC
  Administered 2016-06-30 – 2016-07-02 (×4): 40 mg via ORAL
  Filled 2016-06-30 (×4): qty 1

## 2016-06-30 MED ORDER — SODIUM CHLORIDE 0.9 % IV BOLUS (SEPSIS)
1000.0000 mL | Freq: Once | INTRAVENOUS | Status: AC
  Administered 2016-06-30: 1000 mL via INTRAVENOUS

## 2016-06-30 MED ORDER — SODIUM CHLORIDE 0.9 % IV BOLUS (SEPSIS)
500.0000 mL | Freq: Once | INTRAVENOUS | Status: AC
  Administered 2016-06-30: 500 mL via INTRAVENOUS

## 2016-06-30 MED ORDER — CLONAZEPAM 0.5 MG PO TABS: 0.5000 mg | ORAL_TABLET | Freq: Three times a day (TID) | ORAL | Status: DC | PRN

## 2016-06-30 MED ORDER — VANCOMYCIN HCL IN DEXTROSE 1-5 GM/200ML-% IV SOLN
1000.0000 mg | Freq: Once | INTRAVENOUS | Status: AC
  Administered 2016-06-30: 1000 mg via INTRAVENOUS
  Filled 2016-06-30: qty 200

## 2016-06-30 MED ORDER — ALBUTEROL SULFATE HFA 108 (90 BASE) MCG/ACT IN AERS: 1.0000 | INHALATION_SPRAY | Freq: Four times a day (QID) | RESPIRATORY_TRACT | Status: DC | PRN

## 2016-06-30 MED ORDER — DEXTROSE 5 % IV SOLN: 2.0000 g | INTRAVENOUS | Status: DC

## 2016-06-30 MED ORDER — ORAL CARE MOUTH RINSE
15.0000 mL | Freq: Two times a day (BID) | OROMUCOSAL | Status: DC
  Administered 2016-06-30 – 2016-07-02 (×4): 15 mL via OROMUCOSAL

## 2016-06-30 MED ORDER — CHLORHEXIDINE GLUCONATE 0.12 % MT SOLN
15.0000 mL | Freq: Two times a day (BID) | OROMUCOSAL | Status: DC
  Administered 2016-06-30 (×2): 15 mL via OROMUCOSAL
  Filled 2016-06-30: qty 15

## 2016-06-30 MED ORDER — ENOXAPARIN SODIUM 40 MG/0.4ML ~~LOC~~ SOLN
40.0000 mg | SUBCUTANEOUS | Status: DC
  Administered 2016-06-30 – 2016-07-03 (×4): 40 mg via SUBCUTANEOUS
  Filled 2016-06-30 (×5): qty 0.4

## 2016-06-30 MED ORDER — PIPERACILLIN-TAZOBACTAM 3.375 G IVPB 30 MIN: 3.3750 g | Freq: Once | INTRAVENOUS | Status: DC

## 2016-06-30 MED ORDER — ALBUTEROL SULFATE (2.5 MG/3ML) 0.083% IN NEBU: 2.5000 mg | INHALATION_SOLUTION | Freq: Four times a day (QID) | RESPIRATORY_TRACT | Status: DC | PRN

## 2016-06-30 MED ORDER — SODIUM CHLORIDE 0.9 % IV SOLN
INTRAVENOUS | Status: AC
  Administered 2016-06-30 – 2016-07-01 (×3): via INTRAVENOUS

## 2016-06-30 MED ORDER — CLONAZEPAM 0.5 MG PO TABS
0.5000 mg | ORAL_TABLET | Freq: Three times a day (TID) | ORAL | Status: DC | PRN
  Administered 2016-06-30 – 2016-07-03 (×5): 0.5 mg via ORAL
  Filled 2016-06-30 (×6): qty 1

## 2016-06-30 MED ORDER — PIPERACILLIN-TAZOBACTAM 3.375 G IVPB
3.3750 g | Freq: Three times a day (TID) | INTRAVENOUS | Status: DC
  Administered 2016-06-30 – 2016-07-01 (×4): 3.375 g via INTRAVENOUS
  Filled 2016-06-30 (×4): qty 50

## 2016-06-30 MED ORDER — INSULIN ASPART 100 UNIT/ML ~~LOC~~ SOLN
0.0000 [IU] | Freq: Three times a day (TID) | SUBCUTANEOUS | Status: DC
  Administered 2016-06-30 (×2): 2 [IU] via SUBCUTANEOUS
  Administered 2016-07-01: 3 [IU] via SUBCUTANEOUS
  Administered 2016-07-01: 2 [IU] via SUBCUTANEOUS
  Administered 2016-07-01 – 2016-07-02 (×2): 3 [IU] via SUBCUTANEOUS
  Administered 2016-07-02: 2 [IU] via SUBCUTANEOUS

## 2016-06-30 MED ORDER — POLYETHYLENE GLYCOL 3350 17 G PO PACK
17.0000 g | PACK | Freq: Every day | ORAL | Status: DC
  Administered 2016-07-01 – 2016-07-03 (×2): 17 g via ORAL
  Filled 2016-06-30 (×2): qty 1

## 2016-06-30 MED ORDER — BISACODYL 10 MG RE SUPP: 10.0000 mg | Freq: Every day | RECTAL | Status: DC | PRN

## 2016-06-30 MED ORDER — VANCOMYCIN HCL IN DEXTROSE 750-5 MG/150ML-% IV SOLN
750.0000 mg | INTRAVENOUS | Status: DC
  Filled 2016-06-30: qty 150

## 2016-06-30 MED ORDER — MAGNESIUM SULFATE 2 GM/50ML IV SOLN
2.0000 g | Freq: Once | INTRAVENOUS | Status: AC
  Administered 2016-06-30: 2 g via INTRAVENOUS
  Filled 2016-06-30: qty 50

## 2016-06-30 MED ORDER — ACETAMINOPHEN 325 MG PO TABS
650.0000 mg | ORAL_TABLET | Freq: Four times a day (QID) | ORAL | Status: DC | PRN
  Administered 2016-06-30 – 2016-07-03 (×3): 650 mg via ORAL
  Filled 2016-06-30 (×3): qty 2

## 2016-06-30 MED ORDER — ACETAMINOPHEN 650 MG RE SUPP: 650.0000 mg | Freq: Four times a day (QID) | RECTAL | Status: DC | PRN

## 2016-06-30 MED ORDER — PANTOPRAZOLE SODIUM 40 MG PO TBEC
40.0000 mg | DELAYED_RELEASE_TABLET | Freq: Every day | ORAL | Status: DC
  Administered 2016-06-30 – 2016-07-03 (×4): 40 mg via ORAL
  Filled 2016-06-30 (×4): qty 1

## 2016-06-30 MED ORDER — ADULT MULTIVITAMIN W/MINERALS CH
1.0000 | ORAL_TABLET | Freq: Every day | ORAL | Status: DC
  Administered 2016-06-30 – 2016-07-03 (×4): 1 via ORAL
  Filled 2016-06-30 (×4): qty 1

## 2016-06-30 MED ORDER — HYDROCORTISONE NA SUCCINATE PF 100 MG IJ SOLR
50.0000 mg | Freq: Four times a day (QID) | INTRAMUSCULAR | Status: DC
  Administered 2016-06-30 – 2016-07-02 (×7): 50 mg via INTRAVENOUS
  Filled 2016-06-30 (×2): qty 1
  Filled 2016-06-30: qty 2
  Filled 2016-06-30 (×3): qty 1
  Filled 2016-06-30 (×2): qty 2

## 2016-06-30 MED ORDER — BISACODYL 10 MG RE SUPP
10.0000 mg | Freq: Every day | RECTAL | Status: AC
  Administered 2016-07-01: 10 mg via RECTAL
  Filled 2016-06-30: qty 1

## 2016-06-30 MED ORDER — ORAL CARE MOUTH RINSE: 15.0000 mL | Freq: Two times a day (BID) | OROMUCOSAL | Status: DC

## 2016-06-30 MED ORDER — PHENYLEPHRINE HCL-NACL 10-0.9 MG/250ML-% IV SOLN
0.0000 ug/min | INTRAVENOUS | Status: DC
  Administered 2016-06-30: 20 ug/min via INTRAVENOUS
  Filled 2016-06-30: qty 250

## 2016-06-30 MED ORDER — SENNOSIDES-DOCUSATE SODIUM 8.6-50 MG PO TABS
1.0000 | ORAL_TABLET | Freq: Two times a day (BID) | ORAL | Status: DC
  Administered 2016-06-30 – 2016-07-03 (×6): 1 via ORAL
  Filled 2016-06-30 (×5): qty 1

## 2016-06-30 MED ORDER — ENSURE ENLIVE PO LIQD
237.0000 mL | Freq: Three times a day (TID) | ORAL | Status: DC
  Administered 2016-06-30 – 2016-07-03 (×9): 237 mL via ORAL

## 2016-06-30 MED ORDER — VITAMIN D3 25 MCG (1000 UNIT) PO TABS
1000.0000 [IU] | ORAL_TABLET | Freq: Every day | ORAL | Status: DC
  Administered 2016-06-30 – 2016-07-03 (×4): 1000 [IU] via ORAL
  Filled 2016-06-30 (×4): qty 1

## 2016-06-30 MED ORDER — ASPIRIN EC 81 MG PO TBEC
81.0000 mg | DELAYED_RELEASE_TABLET | Freq: Every day | ORAL | Status: DC
  Administered 2016-06-30 – 2016-07-03 (×4): 81 mg via ORAL
  Filled 2016-06-30 (×4): qty 1

## 2016-06-30 NOTE — Progress Notes (Signed)
PROGRESS NOTE  Elizabeth Keller ZOX:[id number] DOB: 1923/08/02 DOA: 06/29/2016 PCP: Sandford Craze, NP  HPI/Recap of past 24 hours:  Patient is seen with multiple family member at the bedside, report feeling better, denies pain  Assessment/Plan: Principal Problem:   Sepsis (HCC) Active Problems:   Chronic kidney disease, stage III (moderate)   Pressure ulcer of unspecified buttock, stage 2   DM2 (diabetes mellitus, type 2) (HCC)   Infective urethritis  Septic shock from uti/metbolic encephalopathy/hallicination  required pressors/ivf Now off pressor, bp remain borderline low, poor urine output, fluids bolus with 500cc, increase maintenance fluids from 75cc/hr to 100cc/hr, start stress dose steroids, continue abx with vanc/zosyn for now, culture pending  Indwelling foley , patient was discharged home with a foley from last hospitalization in 05/2016 due to urinary retention, per family the foley has not been changed since, will change foley   Diastolic CHF last EF measured in February 2018 was 55-60% with grade 1 diastolic dysfunction - holding off diuretics due to sepsis.  noninsulin dependent Diabetes mellitus type 2 : last a1c 6.6 in 05/2016, hold home meds metformin,  on sliding scale coverage.  FTT, has been bedbound since 03/2016, from home Stage 4 pressure injury to sacrum and unstageable pressure injury to  bilateral heels, present on admission. She ahs bedside excision debridement of sacral ulcer during last hospitalization in 05/2016, wound care consulted, hydrotherapy.   Severe malnutrition in context of acute illness/injury, Underweight Nutrition input appreciated  History of sarcoidosis presently not on medications.  Code Status: limited code, acls meds /pressor only, no cpr, no intubation  Family Communication: patient and multiple family members at bedside  Disposition Plan: family agreed to SNF placement   Consultants:  Critical care  Wound  care  Procedures:  hydrotherapy  Antibiotics:  vanc/zosyn   Objective: BP (!) 109/49   Pulse (!) 105   Temp 99.3 F (37.4 C) (Rectal)   Resp (!) 25   Ht 5\' 7"  (1.702 m)   Wt 57.4 kg (126 lb 8.7 oz)   SpO2 98%   BMI 19.82 kg/m   Intake/Output Summary (Last 24 hours) at 06/30/16 1454 Last data filed at 06/30/16 1400  Gross per 24 hour  Intake          5249.38 ml  Output              155 ml  Net          5094.38 ml   Filed Weights   06/29/16 1829 06/30/16 0015 06/30/16 0500  Weight: 54.4 kg (120 lb) 53.3 kg (117 lb 8.1 oz) 57.4 kg (126 lb 8.7 oz)    Exam:   General:  Frail, oriented to person, know she is in the hospital, not able to state the name, not oriented to time  Cardiovascular: sinus tachycardia  Respiratory: diminished at basis, no rales, no wheezing, no rhonchi  Abdomen: Soft/ND/NT, positive BS  Musculoskeletal: bilateral foot protector in place  Neuro: oriented to person, know she is in the hospital, not able to state the name, not oriented to time  Skin: pressure ulcers presented on admission  Data Reviewed: Basic Metabolic Panel:  Recent Labs Lab 06/29/16 1908 06/30/16 0305  NA 139 142  K 3.9 3.7  CL 106 112*  CO2 22 18*  GLUCOSE 112* 104*  BUN 21* 22*  CREATININE 0.79 0.83  CALCIUM 9.4 8.7*  MG  --  1.6*   Liver Function Tests:  Recent Labs Lab 06/29/16 1908 06/30/16 0305  AST 16 19  ALT 7* 7*  ALKPHOS 57 55  BILITOT 0.5 1.0  PROT 6.4* 5.9*  ALBUMIN 3.0* 2.9*   No results for input(s): LIPASE, AMYLASE in the last 168 hours. No results for input(s): AMMONIA in the last 168 hours. CBC:  Recent Labs Lab 06/29/16 1908 06/30/16 0305  WBC 7.0 13.6*  NEUTROABS 4.0  --   HGB 10.3* 10.3*  HCT 31.7* 32.7*  MCV 89.0 91.6  PLT 272 218   Cardiac Enzymes:    Recent Labs Lab 06/30/16 0101 06/30/16 0516  TROPONINI <0.03 <0.03   BNP (last 3 results)  Recent Labs  12/19/15 1100 03/14/16 1716  BNP 155.0* 197.2*     ProBNP (last 3 results) No results for input(s): PROBNP in the last 8760 hours.  CBG:  Recent Labs Lab 06/26/16 1035 06/30/16 0752 06/30/16 1153  GLUCAP 167* 192* 157*    Recent Results (from the past 240 hour(s))  Aerobic Culture (superficial specimen)     Status: None   Collection Time: 06/22/16 10:30 AM  Result Value Ref Range Status   Specimen Description FOOT RIGHT CALCANEOUS  Final   Special Requests NONE  Final   Gram Stain   Final    FEW WBC PRESENT, PREDOMINANTLY MONONUCLEAR NO ORGANISMS SEEN    Culture   Final    NORMAL SKIN FLORA Performed at [facility], [address], [place], [place] [address]    Report Status 06/25/2016 FINAL  Final  Blood culture (routine x 2)     Status: None (Preliminary result)   Collection Time: 06/29/16  8:50 PM  Result Value Ref Range Status   Specimen Description BLOOD BLOOD RIGHT FOREARM  Final   Special Requests IN PEDIATRIC BOTTLE Blood Culture adequate volume  Final   Culture   Final    NO GROWTH < 24 HOURS Performed at [facility], [address], [place], [place] [address]    Report Status PENDING  Incomplete  MRSA PCR Screening     Status: None   Collection Time: 06/30/16  1:36 AM  Result Value Ref Range Status   MRSA by PCR NEGATIVE NEGATIVE Final    Comment:        The GeneXpert MRSA Assay (FDA approved for NASAL specimens only), is one component of a comprehensive MRSA colonization surveillance program. It is not intended to diagnose MRSA infection nor to guide or monitor treatment for MRSA infections.      Studies: Dg Chest 1 View  Result Date: 06/29/2016 CLINICAL DATA:  Chest pain EXAM: CHEST 1 VIEW COMPARISON:  05/15/2016 FINDINGS: Cardiac shadow is stable. Biapical scarring is noted and stable from a prior exam. No new focal infiltrate is seen. No acute bony abnormality is noted. IMPRESSION: Chronic scarring in the apices bilaterally. Electronically Signed   By: Alcide Clever M.D.    On: 06/29/2016 20:12   Dg Abdomen 1 View  Result Date: 06/29/2016 CLINICAL DATA:  Acute onset of generalized abdominal pain and pelvic pain. Initial encounter. EXAM: ABDOMEN - 1 VIEW COMPARISON:  CT of the abdomen and pelvis performed 04/03/2016 FINDINGS: The visualized bowel gas pattern is unremarkable. Scattered air and stool filled loops of colon are seen; no abnormal dilatation of small bowel loops is seen to suggest small bowel obstruction. No free intra-abdominal air is identified, though evaluation for free air is limited on a single supine view. Degenerative change is noted about the pubic symphysis; the sacroiliac joints are unremarkable in appearance.  IMPRESSION: Unremarkable bowel gas pattern; no free intra-abdominal air seen. Moderate to large amount of stool noted in the colon. Electronically Signed   By: Roanna RaiderJeffery  Chang M.D.   On: 06/29/2016 20:24    Scheduled Meds: . aspirin EC  81 mg Oral Daily  . cholecalciferol  1,000 Units Oral Daily  . enoxaparin (LOVENOX) injection  40 mg Subcutaneous Q24H  . feeding supplement (ENSURE ENLIVE)  237 mL Oral TID BM  . insulin aspart  0-9 Units Subcutaneous TID WC  . mouth rinse  15 mL Mouth Rinse BID  . multivitamin with minerals  1 tablet Oral Daily  . pantoprazole  40 mg Oral Daily  . simvastatin  40 mg Oral QHS    Continuous Infusions: . sodium chloride 75 mL/hr at 06/30/16 1400  . phenylephrine (NEO-SYNEPHRINE) Adult infusion 5 mcg/min (06/30/16 1400)  . piperacillin-tazobactam (ZOSYN)  IV 3.375 g (06/30/16 1443)  . [START ON 07/01/2016] vancomycin       Time spent> 30mins from 8:25am to 9am,  Progress note last entry  Denesha Brouse MD, PhD  [facility] Pager [phone]. If 7PM-7AM, please contact night-coverage at [email or web address], password [email or web address] 06/30/2016, 2:54 PM  LOS: 0 days
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
## 2016-06-30 NOTE — Telephone Encounter (Signed)
Daughter lvm at 8:46am cancelling patient 11am Hospital Follow Up due to patient being hospitalized, charge or no charge

## 2016-06-30 NOTE — Consult Note (Signed)
WOC Nurse wound consult note Reason for Consult: Stage 4 pressure injury to sacrum and unstageable pressure injury to  bilateral heels, present on admission.  Right trochanter with 3 cm x 3 cm maroon discoloration, deep tissue injury, present on admission. Daughter at bedside states that patient favors lying on the right side.  Will order low air loss mattress and turn/reposition every two hours.  Wound type: Full thickness pressure injuries Pressure Injury POA: Yes Measurement: Sacrum:  6 cm x 5 cm x 1.4 cm  Left heel:  2 cm x 4 cm fluctuant eschar Right heel:  3 cm x 3 cm unstageable pressure injury with 100% adherent slough Wound IEP:PIRJJObed:Sacrum:  Pale pink, nongranulating Drainage (amount, consistency, odor) Moderate serosanguinous Periwound:intact Dressing procedure/placement/frequency:Cleanse wound to sacrum with Ns and pat gently dry.  Fill wound depth with Ns moist gauze. Cover with ABD pad and tape.  Change twice daily.  Offload pressure to right trochanter and monitor deep tissue injury.  Turn and reposition every two hours.  PT consult for Hydrotherapy to bilateral heels.  100% devitalized tissue present.  Will not follow at this time.  Please re-consult if needed.  Maple HudsonKaren Chamille Werntz RN BSN CWON Pager [phone]

## 2016-06-30 NOTE — Progress Notes (Signed)
CSW consulted to assist with psychosocial family issues relating to decision making. Please see CSW note dated 06/29/16. According to CSW note, eldest daughter has been assigned as health care agent. Middle daughter reports HCPOA is not valid due to a relative signing as witness. CSW discussed this with Wandra MannanZack Brooks, CSW ChiropodistAssistant Director. Pt had capacity when HCPOA was completed and pt chose eldest daughter as her health care agent which should be respected. In order to invalidate HCPOA it will need to be proven that relative signed as witness. If pt has capacity she can chose to do another HCPOA. If pt lacks capacity, and HCPOA is invalid, pt's adult children are considered decision makers, and majority rules. Legal guardianship may be needed if children are unable to make decisions together on behalf of pt. Anyone of pt's children can petition for guardian, again, if pt lacks capacity.  CSW will remain available to assist with psychosocial issues / dc planning needs.  Cori RazorJamie Roddrick Sharron LCSW [phone]

## 2016-06-30 NOTE — Telephone Encounter (Signed)
No charge. 

## 2016-06-30 NOTE — Progress Notes (Signed)
PHARMACY NOTE -  ANTIBIOTIC RENAL DOSE ADJUSTMENT   Request received for Pharmacy to assist with antibiotic renal dose adjustment.  Patient has been initiated on Ceftriaxone 2gm iv q24hr   for UTI. SCr 0.79, estimated CrCl 39.3 ml/min Current dosage is appropriate and need for further dosage adjustment appears unlikely at present. Will sign off at this time.  Please reconsult if a change in clinical status warrants re-evaluation of dosage.

## 2016-06-30 NOTE — H&P (Signed)
History and Physical    Elizabeth Keller ZOX:[id number] DOB: [birth year] DOA: 06/29/2016  PCP: Sandford Craze, NP  Patient coming from: Home.  Chief Complaint: Generalized body ache.  HPI: Elizabeth Keller is a 83 y.o. female with history of sarcoidosis, diabetes mellitus type 2, sacral decubitus and heel ulcers, CHF, anemia and hyperlipidemia was brought to the ER after patient was having generalized body aches over the last few days. As per the daughter patient has not been feeling well for last few days. Denies any nausea vomiting diarrhea chest pain but has been having some shortness of breath.   ED Course: In the ER patient was found to be hypotensive with initial lactate normal but later on started increasing. Patient was given fluid bolus and blood cultures obtained following which patient was started on antibiotics for possible sepsis from UTI since UA was compatible with UTI. Patient was complaining of abdominal discomfort in the suprapubic area and x-rays were unremarkable.  Review of Systems: As per HPI, rest all negative.   Past Medical History:  Diagnosis Date  . Arthritis   . Asthma   . Degenerative disk disease   . Diabetes mellitus   . GERD (gastroesophageal reflux disease)   . Hypercholesteremia   . Hypertension   . IBS (irritable bowel syndrome)   . Sarcoidosis   . Spinal stenosis     Past Surgical History:  Procedure Laterality Date  . EYE SURGERY  2017   cataract / glaucoma     reports that she has quit smoking. She has never used smokeless tobacco. She reports that she does not drink alcohol or use drugs.  Allergies  Allergen Reactions  . Dicyclomine Other (See Comments)    Reaction:  Dizziness   . Gabapentin Other (See Comments)    Reaction:  Dizziness   . Metformin And Related Nausea And Vomiting  . Lyrica [Pregabalin] Other (See Comments)    Reaction:  Unknown  . Topiramate Other (See Comments)    Reaction:  Unknown    Family History    Problem Relation Age of Onset  . Hypertension Other     Prior to Admission medications   Medication Sig Start Date End Date Taking? Authorizing Provider  acidophilus (RISAQUAD) CAPS capsule Take 1 capsule by mouth daily.   Yes [provider]  albuterol (PROVENTIL HFA;VENTOLIN HFA) 108 (90 Base) MCG/ACT inhaler Inhale 1-2 puffs into the lungs every 6 (six) hours as needed for wheezing or shortness of breath.    Yes [provider]  albuterol (PROVENTIL) (2.5 MG/3ML) 0.083% nebulizer solution Take 2.5 mg by nebulization every 6 (six) hours as needed for wheezing or shortness of breath.   Yes [provider]  aspirin EC 81 MG tablet Take 81 mg by mouth daily.   Yes [provider]  bisacodyl (DULCOLAX) 10 MG suppository Place 1 suppository (10 mg total) rectally daily as needed for moderate constipation. 05/20/16  Yes Sheikh, Omair Latif, DO  cholecalciferol (VITAMIN D) 1000 units tablet Take 1,000 Units by mouth daily.   Yes [provider]  clonazePAM (KLONOPIN) 0.5 MG tablet TAKE 1/2 TABLET BY MOUTH DAILY IN THE MORNING, 1 TABLET AT 2PM, MAY TAKE 1/2 TABLET BETWEEN MORNING AND 2PM, THEN DELAY 2PM DOSE TO 7PM 05/11/16  Yes Wendling, Jilda Roche, DO  collagenase (SANTYL) ointment Apply 1 application topically daily.    Yes [provider]  diclofenac sodium (VOLTAREN) 1 % GEL APPLY 2 GRAMS EXTERNALLY TO THE AFFECTED AREA FOUR TIMES  DAILY AS NEEDED FOR PAIN 06/26/16  Yes Sandford Craze'Sullivan, Melissa, NP  feeding supplement, ENSURE ENLIVE, (ENSURE ENLIVE) LIQD Take 237 mLs by mouth 3 (three) times daily between meals. 05/20/16  Yes Sheikh, Omair Latif, DO  HYDROcodone-acetaminophen (NORCO/VICODIN) 5-325 MG tablet Take 1 tablet by mouth every 6 (six) hours as needed for moderate pain. 06/26/16  Yes Vanetta MuldersZackowski, Scott, MD  megestrol (MEGACE) 20 MG tablet Take 1 tablet (20 mg total) by mouth daily. 04/03/16  Yes Sandford Craze'Sullivan, Melissa, NP  metFORMIN (GLUCOPHAGE) 500  MG tablet Take 500 mg by mouth daily with breakfast.    Yes [provider]  Multiple Vitamin (MULTIVITAMIN WITH MINERALS) TABS tablet Take 1 tablet by mouth daily. 05/21/16  Yes Sheikh, Omair Latif, DO  omeprazole (PRILOSEC) 40 MG capsule Take 40 mg by mouth daily.    Yes [provider]  simvastatin (ZOCOR) 40 MG tablet Take 40 mg by mouth at bedtime.    Yes [provider]  torsemide (DEMADEX) 5 MG tablet Take 5 mg by mouth daily.   Yes [provider]    Physical Exam: Vitals:   06/30/16 0015 06/30/16 0049 06/30/16 0127 06/30/16 0133  BP: (!) 70/39 (!) 98/56 (!) 88/46 (!) 79/51  Pulse: (!) 107 91    Resp: (!) 22     Temp: 98 F (36.7 C)     TempSrc: Oral     SpO2: 97%     Weight: 53.3 kg (117 lb 8.1 oz)     Height: 5\' 7"  (1.702 m)         Constitutional: Moderately built and poorly nourished. Vitals:   06/30/16 0015 06/30/16 0049 06/30/16 0127 06/30/16 0133  BP: (!) 70/39 (!) 98/56 (!) 88/46 (!) 79/51  Pulse: (!) 107 91    Resp: (!) 22     Temp: 98 F (36.7 C)     TempSrc: Oral     SpO2: 97%     Weight: 53.3 kg (117 lb 8.1 oz)     Height: 5\' 7"  (1.702 m)      Eyes: Anicteric mild pallor. ENMT: No discharge from the ears eyes nose and mouth. Neck: No mass felt. No JVD appreciated. Respiratory: No rhonchi or crepitations. Cardiovascular: S1-S2 heard no murmurs appreciated. Abdomen: Soft nontender bowel supplement. Musculoskeletal: No edema. No joint effusion. Skin: Sacral decubitus and bilateral heel ulcers. Neurologic: Alert awake oriented to her name and place. Moves all extremities. Psychiatric: Oriented to her name and place.   Labs on Admission: I have personally reviewed following labs and imaging studies  CBC:  Recent Labs Lab 06/29/16 1908  WBC 7.0  NEUTROABS 4.0  HGB 10.3*  HCT 31.7*  MCV 89.0  PLT 272   Basic Metabolic Panel:  Recent Labs Lab 06/29/16 1908  NA 139  K 3.9  CL 106  CO2 22  GLUCOSE 112*   BUN 21*  CREATININE 0.79  CALCIUM 9.4   GFR: Estimated Creatinine Clearance: 39.3 mL/min (by C-G formula based on SCr of 0.79 mg/dL). Liver Function Tests:  Recent Labs Lab 06/29/16 1908  AST 16  ALT 7*  ALKPHOS 57  BILITOT 0.5  PROT 6.4*  ALBUMIN 3.0*   No results for input(s): LIPASE, AMYLASE in the last 168 hours. No results for input(s): AMMONIA in the last 168 hours. Coagulation Profile: No results for input(s): INR, PROTIME in the last 168 hours. Cardiac Enzymes: No results for input(s): CKTOTAL, CKMB, CKMBINDEX, TROPONINI in the last 168 hours. BNP (last 3 results) No results  for input(s): PROBNP in the last 8760 hours. HbA1C: No results for input(s): HGBA1C in the last 72 hours. CBG:  Recent Labs Lab 06/26/16 1035  GLUCAP 167*   Lipid Profile: No results for input(s): CHOL, HDL, LDLCALC, TRIG, CHOLHDL, LDLDIRECT in the last 72 hours. Thyroid Function Tests: No results for input(s): TSH, T4TOTAL, FREET4, T3FREE, THYROIDAB in the last 72 hours. Anemia Panel: No results for input(s): VITAMINB12, FOLATE, FERRITIN, TIBC, IRON, RETICCTPCT in the last 72 hours. Urine analysis:    Component Value Date/Time   COLORURINE AMBER (A) 06/29/2016 1947   APPEARANCEUR CLOUDY (A) 06/29/2016 1947   LABSPEC 1.036 (H) 06/29/2016 1947   PHURINE 5.0 06/29/2016 1947   GLUCOSEU NEGATIVE 06/29/2016 1947   HGBUR SMALL (A) 06/29/2016 1947   BILIRUBINUR SMALL (A) 06/29/2016 1947   KETONESUR 20 (A) 06/29/2016 1947   PROTEINUR 30 (A) 06/29/2016 1947   UROBILINOGEN 0.2 10/18/2014 2310   NITRITE NEGATIVE 06/29/2016 1947   LEUKOCYTESUR LARGE (A) 06/29/2016 1947   Sepsis Labs: @LABRCNTIP (procalcitonin:4,lacticidven:4) ) Recent Results (from the past 240 hour(s))  Aerobic Culture (superficial specimen)     Status: None   Collection Time: 06/22/16 10:30 AM  Result Value Ref Range Status   Specimen Description FOOT RIGHT CALCANEOUS  Final   Special Requests NONE  Final   Gram  Stain   Final    FEW WBC PRESENT, PREDOMINANTLY MONONUCLEAR NO ORGANISMS SEEN    Culture   Final    NORMAL SKIN FLORA Performed at [facility], [address], [place], [place] [address]    Report Status 06/25/2016 FINAL  Final     Radiological Exams on Admission: Dg Chest 1 View  Result Date: 06/29/2016 CLINICAL DATA:  Chest pain EXAM: CHEST 1 VIEW COMPARISON:  05/15/2016 FINDINGS: Cardiac shadow is stable. Biapical scarring is noted and stable from a prior exam. No new focal infiltrate is seen. No acute bony abnormality is noted. IMPRESSION: Chronic scarring in the apices bilaterally. Electronically Signed   By: Alcide Clever M.D.   On: 06/29/2016 20:12   Dg Abdomen 1 View  Result Date: 06/29/2016 CLINICAL DATA:  Acute onset of generalized abdominal pain and pelvic pain. Initial encounter. EXAM: ABDOMEN - 1 VIEW COMPARISON:  CT of the abdomen and pelvis performed 04/03/2016 FINDINGS: The visualized bowel gas pattern is unremarkable. Scattered air and stool filled loops of colon are seen; no abnormal dilatation of small bowel loops is seen to suggest small bowel obstruction. No free intra-abdominal air is identified, though evaluation for free air is limited on a single supine view. Degenerative change is noted about the pubic symphysis; the sacroiliac joints are unremarkable in appearance. IMPRESSION: Unremarkable bowel gas pattern; no free intra-abdominal air seen. Moderate to large amount of stool noted in the colon. Electronically Signed   By: Roanna Raider M.D.   On: 06/29/2016 20:24    EKG: Independently reviewed. Normal sinus rhythm.  Assessment/Plan Principal Problem:   Sepsis (HCC) Active Problems:   Chronic kidney disease, stage III (moderate)   Pressure ulcer of unspecified buttock, stage 2   DM2 (diabetes mellitus, type 2) (HCC)   Infective urethritis    1. Sepsis likely from UTI - patient was initially started on ceftriaxone but since patient remained hypotensive  agents antibiotics were broadened to vancomycin and Zosyn. I have given patient a fluid bolus per sepsis protocol since patient's blood pressure remained hypotensive and have consulted pulmonary critical care. Follow cultures and lactate and procalcitonin levels. 2. History of  diastolic CHF last EF measured in February 2018 was 55-60% with grade 1 diastolic dysfunction - holding off diuretics due to sepsis. 3. Diabetes mellitus type 2 - will keep patient on sliding scale coverage. 4. Chronic hip decubitus and heel decubitus - bone team consulted. 5. History of sarcoidosis presently not on medications. 6. Normocytic normochromic anemia appears to be chronic probably related to chronic disease.  There is some dispute between both daughters about healthcare power of attorney. Social work consulted.   DVT prophylaxis: Lovenox. Code Status: Full code.  Family Communication: Patient's daughter.  Disposition Plan: Home.  Consults called: Pulmonary critical care.  Admission status: Inpatient.    Eduard Clos MD [facility] Pager [phone].  If 7PM-7AM, please contact night-coverage [email or web address] Password [email or web address]  06/30/2016, 1:40 AM

## 2016-06-30 NOTE — Progress Notes (Signed)
PCCM AM Follow up Rounding   S:  No events overnight, pressor demand decreasing.  O: Blood pressure (!) 106/45, pulse (!) 105, temperature (!) 96.1 F (35.6 C), temperature source Axillary, resp. rate (!) 23, height 5\' 7"  (1.702 m), weight 126 lb 8.7 oz (57.4 kg), SpO2 95 %.  General:   HEENT: MM pink/moist PSY: confused this AM, some baseline dementia evidently Neuro: Confused but awake and moving all ext to command CV: IRIR, Nl S1/S2, -M/R/G. PULM: even/non-labored, lungs bilaterally MV:HQIOGI:soft, non-tender, bsx4 active  Extremities: warm/dry, - edema  Skin: no rashes or lesions  Recent Labs Lab 06/29/16 1908 06/30/16 0305  HGB 10.3* 10.3*  HCT 31.7* 32.7*  WBC 7.0 13.6*  PLT 272 218   Recent Labs Lab 06/29/16 1908 06/30/16 0305  NA 139 142  K 3.9 3.7  CL 106 112*  CO2 22 18*  GLUCOSE 112* 104*  BUN 21* 22*  CREATININE 0.79 0.83  CALCIUM 9.4 8.7*  MG  --  1.6*   A: Suspected Urosepsis - chronic indwelling foley Hypotension - likely sepsis + medication effect (had fentanyl + klonopin) Lactic Acidosis - chronic metformin use + hypotension  Sacral Decubitus - unstageable  Constipation  P: Continue NS @ 75 ml/hr, will decrease to 50 ml/hr given CHF history Lactic acid down to 1.4 and PCT is 2.27 Hold home torsemide, klonopin / sedating medications  Trend BMP  Replace electrolytes as indicated Continue empiric abx Follow cultures Wean Neo for SBP >90 Lovenox for DVT prophylaxis, if renal function deteriorates will need to change to heparin  I had an extensive conversation with 2 out the 3 daughters.  Eldest daughter had apparently made patient DNR but middle daughter changed to full code.  Spoke with middle and youngest daughters, after a long discussion, I explained that patient has severe heart disease and the fact that she developed shock was very concerning.  Risk of cardiac arrest and respiratory failure are very high here.  After discussion, decision was  made to make patient a LCB with no CPR, cardioversion or intubation if patient deteriorates.  Pressors only.  Will continue to titrate neo hopefully to off and change code status.  PCCM will sign off, please call back if needed.  The patient is critically ill with multiple organ systems failure and requires high complexity decision making for assessment and support, frequent evaluation and titration of therapies, application of advanced monitoring technologies and extensive interpretation of multiple databases.   Critical Care Time devoted to patient care services described in this note is  35  Minutes. This time reflects time of care of this signee Dr Koren BoundWesam Lennox Leikam. This critical care time does not reflect procedure time, or teaching time or supervisory time of PA/NP/Med student/Med Resident etc but could involve care discussion time.  Alyson ReedyWesam G. Savita Runner, M.D. [facility]. Pager: [phone]. After hours pager: [phone].

## 2016-06-30 NOTE — Care Management Note (Signed)
Case Management Note  Patient Details  Name: Elizabeth RoupVerna Keller MRN: [id number] Date of Birth: 1923-06-24  Subjective/Objective:                   83 y.o. female admitted on 06/29/2016 with sepsis  Action/Plan: Pt currently has home health. Date:  Jun 30, 2016 Chart reviewed for concurrent status and case management needs. Will continue to follow patient progress. Discharge Planning: following for needs Expected discharge date: [id number] Marcelle SmilingRhonda Davis, BSN, [place]RN3, [place]   [phone] Expected Discharge Date:   (unknown)               Expected Discharge Plan:  Home/Self Care  In-House Referral:     Discharge planning Services  CM Consult  Post Acute Care Choice:    Choice offered to:     DME Arranged:    DME Agency:     HH Arranged:    HH Agency:     Status of Service:  In process, will continue to follow  If discussed at Long Length of Stay Meetings, dates discussed:    Additional Comments:  Golda AcreDavis, Rhonda Lynn, RN 06/30/2016, 8:52 AM

## 2016-06-30 NOTE — Progress Notes (Signed)
Initial Nutrition Assessment  DOCUMENTATION CODES:   Severe malnutrition in context of acute illness/injury, Underweight  INTERVENTION:  - Continue Ensure Enlive po BID, each supplement provides 350 kcal and 20 grams of protein - Continue daily multivitamin with minerals. - Recommend zinc x10-14 days and ascorbic acid supplementation for wound healing. - Continue to encourage PO intakes of meals, supplements, and snacks. - RD will continue to monitor for additional needs.  NUTRITION DIAGNOSIS:   Malnutrition (severe) related to acute illness (sepsis) as evidenced by percent weight loss, moderate depletions of muscle mass, moderate depletion of body fat, severe depletion of muscle mass.  GOAL:   Patient will meet greater than or equal to 90% of their needs  MONITOR:   PO intake, Supplement acceptance, Weight trends, Labs, Skin  REASON FOR ASSESSMENT:   Malnutrition Screening Tool, Low Braden  ASSESSMENT:   83 y.o. female with history of sarcoidosis, diabetes mellitus type 2, sacral decubitus and heel ulcers, CHF, anemia and hyperlipidemia was brought to the ER after patient was having generalized body aches over the last few days. As per the daughter patient has not been feeling well for last few days. Denies any nausea vomiting diarrhea chest pain but has been having some shortness of breath.   Pt seen for MST and low Braden. BMI indicates underweight status. No documented intakes since admission. Pt was not eating well for the few days PTA. She had also been eating poorly during admission at the beginning of April. Since d/c from that admission, appetite had slowly begun to improve slightly before becoming poor again.   Physical assessment shows moderate and severe muscle and moderate fat wasting. Per chart review, pt has lost 11 lbs (8% body weight) in the past 1.5 months which is significant for time frame.   Also of note, on 4/8 pt had the following wounds: Stage I hip  pressure injury, Stage II buttocks/sacral pressure injury, Unstageable bilateral heel injuries. Compared to currently documented wounds.  Medications reviewed; 1000 units vitamin D/day, sliding scale Novolog, 2 g IV Mg sulfate x1 run today, daily multivitamin with minerals, 40 mg oral Protonix/day. Labs reviewed; CBG: 192 mg/dL today, Cl: 161112 mmol/L, BUN: 22 mg/dL, Ca: 8.7 mg/dL, Mg: 1.6 mg/dL.  IVF: NS @ 75 mL/hr.    Diet Order:  Diet heart healthy/carb modified Room service appropriate? Yes; Fluid consistency: Thin  Skin:  Wound (see comment) (Stage 4 sacral, Stage 2 R heel, Unstageable full-thickness L heel pressure injuries)  Last BM:  5/22  Height:   Ht Readings from Last 1 Encounters:  06/30/16 5\' 7"  (1.702 m)    Weight:   Wt Readings from Last 1 Encounters:  06/30/16 126 lb 8.7 oz (57.4 kg)    Ideal Body Weight:  61.36 kg  BMI:  Body mass index is 19.82 kg/m.  Estimated Nutritional Needs:   Kcal:  2010-2185 (35-38 kcal/kg)  Protein:  92-103 grams (1.6-1.8 grams/kg)  Fluid:  >/= 2.1 L/day  EDUCATION NEEDS:   No education needs identified at this time    Trenton GammonJessica Zykee Avakian, MS, RD, LDN, CNSC Inpatient Clinical Dietitian Pager # [phone] After hours/weekend pager # [phone]

## 2016-06-30 NOTE — Progress Notes (Signed)
Pharmacy Antibiotic Note  Elizabeth RoupVerna Keller is a 83 y.o. female admitted on 06/29/2016 with sepsis.  Pharmacy has been consulted for Vancomycin and Zosyn  dosing.  Plan: Vancomycin 750mg  IV every 24 hours.  Goal trough 15-20 mcg/mL. Zosyn 3.375g IV q8h (4 hour infusion).  Height: 5\' 7"  (170.2 cm) Weight: 117 lb 8.1 oz (53.3 kg) IBW/kg (Calculated) : 61.6  Temp (24hrs), Avg:97.4 F (36.3 C), Min:96.1 F (35.6 C), Max:98.1 F (36.7 C)   Recent Labs Lab 06/29/16 1908 06/29/16 1919 06/30/16 0102 06/30/16 0305  WBC 7.0  --   --  13.6*  CREATININE 0.79  --   --  0.83  LATICACIDVEN  --  1.00 2.4*  --     Estimated Creatinine Clearance: 37.9 mL/min (by C-G formula based on SCr of 0.83 mg/dL).    Allergies  Allergen Reactions  . Dicyclomine Other (See Comments)    Reaction:  Dizziness   . Gabapentin Other (See Comments)    Reaction:  Dizziness   . Metformin And Related Nausea And Vomiting  . Lyrica [Pregabalin] Other (See Comments)    Reaction:  Unknown  . Topiramate Other (See Comments)    Reaction:  Unknown    Antimicrobials this admission: Vancomycin 06/30/2016 >> Zosyn 06/30/2016 >>   Dose adjustments this admission: -  Microbiology results: pending  Thank you for allowing pharmacy to be a part of this patient's care.  Elizabeth DavidsonGrimsley Keller, Elizabeth Keller 06/30/2016 5:11 AM

## 2016-07-01 ENCOUNTER — Ambulatory Visit: Payer: Medicare Other | Admitting: Family Medicine

## 2016-07-01 DIAGNOSIS — E118 Type 2 diabetes mellitus with unspecified complications: Secondary | ICD-10-CM

## 2016-07-01 DIAGNOSIS — Z7189 Other specified counseling: Secondary | ICD-10-CM

## 2016-07-01 DIAGNOSIS — L89302 Pressure ulcer of unspecified buttock, stage 2: Secondary | ICD-10-CM

## 2016-07-01 DIAGNOSIS — E43 Unspecified severe protein-calorie malnutrition: Secondary | ICD-10-CM

## 2016-07-01 DIAGNOSIS — N183 Chronic kidney disease, stage 3 (moderate): Secondary | ICD-10-CM

## 2016-07-01 DIAGNOSIS — Z515 Encounter for palliative care: Secondary | ICD-10-CM

## 2016-07-01 DIAGNOSIS — G9341 Metabolic encephalopathy: Secondary | ICD-10-CM

## 2016-07-01 LAB — URINE CULTURE

## 2016-07-01 LAB — BASIC METABOLIC PANEL
Anion gap: 7 (ref 5–15)
BUN: 24 mg/dL — ABNORMAL HIGH (ref 6–20)
CHLORIDE: 113 mmol/L — AB (ref 101–111)
CO2: 19 mmol/L — ABNORMAL LOW (ref 22–32)
Calcium: 8.2 mg/dL — ABNORMAL LOW (ref 8.9–10.3)
Creatinine, Ser: 0.83 mg/dL (ref 0.44–1.00)
GFR calc non Af Amer: >60 mL/min (ref >60)
Glucose, Bld: 204 mg/dL — ABNORMAL HIGH (ref 65–99)
POTASSIUM: 3.7 mmol/L (ref 3.5–5.1)
Sodium: 139 mmol/L (ref 135–145)

## 2016-07-01 LAB — MAGNESIUM: MAGNESIUM: 2.3 mg/dL (ref 1.7–2.4)

## 2016-07-01 LAB — CBC WITH DIFFERENTIAL/PLATELET
BASOS ABS: 0 10*3/uL (ref 0.0–0.1)
Basophils Relative: 0 %
EOS ABS: 0.1 10*3/uL (ref 0.0–0.7)
EOS PCT: 1 %
HCT: 27.4 % — ABNORMAL LOW (ref 36.0–46.0)
Hemoglobin: 9.2 g/dL — ABNORMAL LOW (ref 12.0–15.0)
LYMPHS ABS: 0.5 10*3/uL — AB (ref 0.7–4.0)
LYMPHS PCT: 5 %
MCH: 30.1 pg (ref 26.0–34.0)
MCHC: 33.6 g/dL (ref 30.0–36.0)
MCV: 89.5 fL (ref 78.0–100.0)
Monocytes Absolute: 0.2 10*3/uL (ref 0.1–1.0)
Monocytes Relative: 2 %
NEUTROS PCT: 92 %
Neutro Abs: 9.9 10*3/uL — ABNORMAL HIGH (ref 1.7–7.7)
Platelets: 223 10*3/uL (ref 150–400)
RBC: 3.06 MIL/uL — AB (ref 3.87–5.11)
RDW: 19.1 % — ABNORMAL HIGH (ref 11.5–15.5)
WBC: 10.7 10*3/uL — AB (ref 4.0–10.5)

## 2016-07-01 LAB — GLUCOSE, CAPILLARY
Glucose-Capillary: 172 mg/dL — ABNORMAL HIGH (ref 65–99)
Glucose-Capillary: 237 mg/dL — ABNORMAL HIGH (ref 65–99)

## 2016-07-01 MED ORDER — PIPERACILLIN-TAZOBACTAM 3.375 G IVPB
3.3750 g | Freq: Three times a day (TID) | INTRAVENOUS | Status: DC
  Administered 2016-07-01 – 2016-07-02 (×3): 3.375 g via INTRAVENOUS
  Filled 2016-07-01 (×4): qty 50

## 2016-07-01 MED ORDER — SODIUM CHLORIDE 0.9 % IV BOLUS (SEPSIS)
500.0000 mL | Freq: Once | INTRAVENOUS | Status: AC
  Administered 2016-07-01: 500 mL via INTRAVENOUS

## 2016-07-01 NOTE — Progress Notes (Signed)
Physical Therapy Wound Evaluation Patient Details  Name: Elizabeth Keller MRN: [id number] Date of Birth: [birth year]  Today's Date: 07/01/2016 Time: 1435-1500 Time Calculation (min): 25 min  Subjective  Subjective: Pt wanting miralax during session, daughter assisted with providing drink by straw. Patient and Family Stated Goals: Family states pt wishes to go to rehab and be able to walk again. Prior Treatments: none known to heels  Pain Score:  Did not report pain during session other then with positioning  Wound Assessment                                                                                                                                                                  Pressure Injury 07/01/16 Stage III -  Full thickness tissue loss. Subcutaneous fat may be visible but bone, tendon or muscle are NOT exposed. PT hydrotherapy (Active)  Dressing Type Gauze (Comment);Moist to dry;Foam 07/01/2016  3:00 PM  Dressing Changed 07/01/2016  3:00 PM  Dressing Change Frequency PRN 07/01/2016  3:00 PM  State of Healing Eschar 07/01/2016  3:00 PM  Site / Wound Assessment Yellow;Black 07/01/2016  3:00 PM  % Wound base Red or Granulating 0% 07/01/2016  3:00 PM  % Wound base Yellow/Fibrinous Exudate 75% 07/01/2016  3:00 PM  % Wound base Black/Eschar 25% 07/01/2016  3:00 PM  Wound Length (cm) 3.5 cm 07/01/2016  3:00 PM  Wound Width (cm) 5.5 cm 07/01/2016  3:00 PM  Margins Unattached edges (unapproximated) 07/01/2016  3:00 PM  Drainage Amount Moderate 07/01/2016  3:00 PM  Drainage Description Serous 07/01/2016  3:00 PM  Treatment Debridement (Selective);Hydrotherapy (Pulse lavage) 07/01/2016  3:00 PM   Hydrotherapy Pulsed lavage therapy - wound location: Right heel Pulsed Lavage with Suction (psi): 8 psi Pulsed Lavage with Suction - Normal Saline Used: 1000 mL Pulsed Lavage Tip: Tip with splash shield Selective Debridement Selective Debridement - Location:  Right heel Selective Debridement - Tools Used: Scissors;Forceps Selective Debridement - Tissue Removed: slough, eschar   Wound Assessment and Plan  Wound Therapy - Assess/Plan/Recommendations Wound Therapy - Clinical Statement: Pt presents with right heel stage III pressure injury which would benefit from hydrotherapy in order to reduce bioburden and promote wound healing.  Left heel also with unstageable pressure injury however this is covered in black eschar.  Do not feel L heel would benefit from hydrotherapy at this time as injury is stable and covered with eschar.  If L heel opens or begins to drain, will reassess need for hydrotherapy at that time. Wound Therapy - Functional Problem List: Pt has been bedbound since February, also with sacral pressure injury and sDTI to right trochanter. Factors Delaying/Impairing Wound Healing: Diabetes Mellitus;Immobility Hydrotherapy Plan: Debridement;Dressing change;Patient/family education;Pulsatile lavage with suction Wound Therapy - Frequency: 6X / week Wound  Therapy - Follow Up Recommendations: Skilled nursing facility Wound Plan: Plan to perform hydrotherapy to right heel to promote wound heel and remove nonviable tissue.   Wound Therapy Goals- Improve the function of patient's integumentary system by progressing the wound(s) through the phases of wound healing (inflammation - proliferation - remodeling) by: Decrease Necrotic Tissue to: 80 Decrease Necrotic Tissue - Progress: Goal set today Increase Granulation Tissue to: 20 Increase Granulation Tissue - Progress: Goal set today Improve Drainage Characteristics: Min Improve Drainage Characteristics - Progress: Goal set today Goals/treatment plan/discharge plan were made with and agreed upon by patient/family: Yes Time For Goal Achievement: 2 weeks Wound Therapy - Potential for Goals: Fair  Goals will be updated until maximal potential achieved or discharge criteria met.  Discharge criteria:  when goals achieved, discharge from hospital, MD decision/surgical intervention, no progress towards goals, refusal/missing three consecutive treatments without notification or medical reason.  GP     Trini Christiansen,KATHrine E 07/01/2016, 4:07 PM Carmelia Bake, PT, DPT 07/01/2016 Pager: [phone]

## 2016-07-01 NOTE — Progress Notes (Signed)
   07/01/16 1200  Clinical Encounter Type  Visited With Patient and family together  Visit Type Initial;Psychological support;Spiritual support  Referral From Nurse  Consult/Referral To Chaplain  Spiritual Encounters  Spiritual Needs Other (Comment) (Advance Directive )  Stress Factors  Patient Stress Factors Other (Comment) (Advance Directive-Healthcare Power of Attorney )  Family Stress Factors Other (Comment) (Advance Directive-Healthcare Power of Attorney)  Merchant navy officerAdvance Directives (For Healthcare)  Does patient want to make changes to medical advance directive? Yes (Inpatient - patient requests chaplain consult to change a medical advance directive)  Type of Advance Directive Healthcare Power of Attorney   I visited with the patient per spiritual care consult and call from the social worker. I was informed that the patient has different versions of the Advance Directive-Healthcare Power of Attorney paperwork and that she wants to change her Healthcare Power of Attorney to her sister Elizabeth QuinLinda and her son. The paperwork was given to the patient as she was being moved to the 5th floor. I told the patient to have the nurse page us when she is ready to complete the document.   Please, contact Spiritual Care for further assistance.   Chaplain Clint BolderBrittany Danaisha Celli M.Div.

## 2016-07-01 NOTE — Progress Notes (Signed)
I met with patient and her oldest son in conjunction with Werner Lean from social work.   There has been a lot of debate regarding Elizabeth Keller's decision making capacity and particularly regarding who is her legal HCPOA.  She clearly stated when asked multiple times in multiple ways that she feels that her middle daughter, [name], is the appropriate person to aide in decision making.  She clearly stated that her oldest daughter Norm Salt) wants to remain her HCPOA, but Ms. July stated that [place] "does not really understand things the way that Vaughan Basta does."  Capacity has many levels and is something that can change in short periods of time.  However, at this time, I feel that Ms. Kettering has capacity to name who she would like to make decisions on her behalf.  She has clearly stated that she would like Vaughan Basta to serve in this capacity with the reason being that she feels that Vaughan Basta is the person who knows her best and also best understands her medical situation.  I placed an order to chaplain services to update her advance directives.  Full consult to follow.  Micheline Rough, MD [facility] [phone]

## 2016-07-01 NOTE — Consult Note (Signed)
Consultation Note Date: 07/01/2016   Patient Name: Elizabeth Keller  DOB: November 03, 1923  MRN: [id number]  Age / Sex: 83 y.o., female  PCP: Debbrah Alar, NP Referring Physician: Charlynne Cousins, MD  Reason for Consultation: Establishing goals of care and Psychosocial/spiritual support  HPI/Patient Profile: 83 y.o. female  with past medical history of sarcoid, DM 2, sacral decubitus admitted on 06/29/2016 with sepsis likely 2/2 UTI.  Palliative consulted for goals of care.   Clinical Assessment and Goals of Care: I met today with Elizabeth Keller and her son in person both alone and in conjunction with social work.  There has been a lot of debate regarding Elizabeth Keller's decision making capacity and particularly regarding who is her legal HCPOA.  Her son reports that she has been living with her oldest daughter since February of this year.  This daughter has been acting as Economist and took her mother to live with her following last hospitalization over the objection of her other siblings that she would be best served by placement at facility for rehab.  Her family reports that they have been speaking with lawyers since February, and they do not feel that the [facility] document that their sister has is valid due to the fact it was signed by one of her relatives (a son-in-law).  Since this time, there have been multiple HCPOA documents completed by various family members.  This includes one completed at the end of April at a time when she was though to lack capacity to make this decision.  I called and spoke with Elizabeth Keller (oldest daughter who has been acting as POA).  She reports that she is very upset with her family and has been trying her best to care for her mother, but she feels her family "has been working against me."  She then stated that her own health is worsening and she "cannot do it anymore" and "can't make any  more decisions if no one will support me."  She is in agreement with plan for rehab "as long as my mother agrees to that as well."  She requests that she be informed where her mother is discharged.   I then met again with Elizabeth Keller in conjunction with Elizabeth Keller from social work.  During our encounter, Elizabeth Keller is able to fully participate in conversation.  She clearly stated when asked multiple times in multiple ways that she feels that her middle daughter, Elizabeth Keller, is the appropriate person to aide in healthcare related decision making.  She clearly stated that her oldest daughter Elizabeth Keller) wants to remain her HCPOA, but [name] stated that [place] "does not really understand things the way that Elizabeth Keller does."  Capacity has many levels and is something that can change in short periods of time.  However, at this time, I feel that Elizabeth Keller has capacity to name who she would like to make decisions on her behalf.  She has clearly stated that she would like Elizabeth Keller to serve in this capacity with the  reason being that she feels that Elizabeth Keller is the person who knows her best and also best understands her medical situation.  SUMMARY OF RECOMMENDATIONS   - There has been a lot of confusion regarding Elizabeth Keller's HCPOA.  Today, she is very clear when asked that her daughter, Elizabeth Keller, is whom she would like to serve as her HCPOA.  I placed a consult to spiritual care to complete updated HCPOA.  Based on my encounter with her today, she has the capacity to make this decision at this time. - She is also agreeable to going to rehab for strengthening and wound care.  She asks very appropriate questions and seems to understand the plan well.  Code Status/Advance Care Planning: Limited code: Bipap and pressors only  Psycho-social/Spiritual:   Desire for further Chaplaincy support:yes  Additional Recommendations: Palliative follow-up at SNF  Prognosis:   Unable to  determine  Discharge Planning: [place] for rehab with Palliative care service follow-up      Primary Diagnoses: Present on Admission: . Sepsis ([place]) . Pressure ulcer of unspecified buttock, stage 2 . Chronic kidney disease, stage III (moderate)   I have reviewed the medical record, interviewed the patient and family, and examined the patient. The following aspects are pertinent.  Past Medical History:  Diagnosis Date  . Arthritis   . Asthma   . Degenerative disk disease   . Diabetes mellitus   . GERD (gastroesophageal reflux disease)   . Hypercholesteremia   . Hypertension   . IBS (irritable bowel syndrome)   . Sarcoidosis   . Spinal stenosis    Social History   Social History  . Marital status: Divorced    Spouse name: N/A  . Number of children: N/A  . Years of education: N/A   Social History Main Topics  . Smoking status: Former Research scientist (life sciences)  . Smokeless tobacco: Never Used  . Alcohol use No  . Drug use: No  . Sexual activity: Not Asked   Other Topics Concern  . None   Social History Narrative  . None   Family History  Problem Relation Age of Onset  . Hypertension Other    Scheduled Meds: . aspirin EC  81 mg Oral Daily  . bisacodyl  10 mg Rectal Daily  . cholecalciferol  1,000 Units Oral Daily  . enoxaparin (LOVENOX) injection  40 mg Subcutaneous Q24H  . feeding supplement (ENSURE ENLIVE)  237 mL Oral TID BM  . hydrocortisone sod succinate (SOLU-CORTEF) inj  50 mg Intravenous Q6H  . insulin aspart  0-9 Units Subcutaneous TID WC  . mouth rinse  15 mL Mouth Rinse BID  . multivitamin with minerals  1 tablet Oral Daily  . pantoprazole  40 mg Oral Daily  . polyethylene glycol  17 g Oral Daily  . senna-docusate  1 tablet Oral BID  . simvastatin  40 mg Oral QHS   Continuous Infusions: . phenylephrine (NEO-SYNEPHRINE) Adult infusion Stopped (06/30/16 1420)  . piperacillin-tazobactam (ZOSYN)  IV     PRN Meds:.acetaminophen **OR**  acetaminophen, albuterol, clonazePAM Medications Prior to Admission:  Prior to Admission medications   Medication Sig Start Date End Date Taking? Authorizing Provider  acidophilus (RISAQUAD) CAPS capsule Take 1 capsule by mouth daily.   Yes [provider]  albuterol (PROVENTIL HFA;VENTOLIN HFA) 108 (90 Base) MCG/ACT inhaler Inhale 1-2 puffs into the lungs every 6 (six) hours as needed for wheezing or shortness of breath.    Yes [provider]  albuterol (PROVENTIL) (  2.5 MG/3ML) 0.083% nebulizer solution Take 2.5 mg by nebulization every 6 (six) hours as needed for wheezing or shortness of breath.   Yes [provider]  aspirin EC 81 MG tablet Take 81 mg by mouth daily.   Yes [provider]  bisacodyl (DULCOLAX) 10 MG suppository Place 1 suppository (10 mg total) rectally daily as needed for moderate constipation. 05/20/16  Yes Sheikh, Omair Latif, DO  cholecalciferol (VITAMIN D) 1000 units tablet Take 1,000 Units by mouth daily.   Yes [provider]  clonazePAM (KLONOPIN) 0.5 MG tablet TAKE 1/2 TABLET BY MOUTH DAILY IN THE MORNING, 1 TABLET AT 2PM, MAY TAKE 1/2 TABLET BETWEEN MORNING AND 2PM, THEN DELAY 2PM DOSE TO 7PM 05/11/16  Yes Wendling, Crosby Oyster, DO  collagenase (SANTYL) ointment Apply 1 application topically daily.    Yes [provider]  diclofenac sodium (VOLTAREN) 1 % GEL APPLY 2 GRAMS EXTERNALLY TO THE AFFECTED AREA FOUR TIMES DAILY AS NEEDED FOR PAIN 06/26/16  Yes Debbrah Alar, NP  feeding supplement, ENSURE ENLIVE, (ENSURE ENLIVE) LIQD Take 237 mLs by mouth 3 (three) times daily between meals. 05/20/16  Yes Sheikh, Omair Latif, DO  HYDROcodone-acetaminophen (NORCO/VICODIN) 5-325 MG tablet Take 1 tablet by mouth every 6 (six) hours as needed for moderate pain. 06/26/16  Yes Fredia Sorrow, MD  megestrol (MEGACE) 20 MG tablet Take 1 tablet (20 mg total) by mouth daily. 04/03/16  Yes Debbrah Alar, NP  metFORMIN  (GLUCOPHAGE) 500 MG tablet Take 500 mg by mouth daily with breakfast.    Yes [provider]  Multiple Vitamin (MULTIVITAMIN WITH MINERALS) TABS tablet Take 1 tablet by mouth daily. 05/21/16  Yes Sheikh, Omair Latif, DO  omeprazole (PRILOSEC) 40 MG capsule Take 40 mg by mouth daily.    Yes [provider]  simvastatin (ZOCOR) 40 MG tablet Take 40 mg by mouth at bedtime.    Yes [provider]  torsemide (DEMADEX) 5 MG tablet Take 5 mg by mouth daily.   Yes [provider]   Allergies  Allergen Reactions  . Dicyclomine Other (See Comments)    Reaction:  Dizziness   . Gabapentin Other (See Comments)    Reaction:  Dizziness   . Metformin And Related Nausea And Vomiting  . Lyrica [Pregabalin] Other (See Comments)    Reaction:  Unknown  . Topiramate Other (See Comments)    Reaction:  Unknown   Review of Systems  Constitutional: Positive for activity change, appetite change and fatigue.  Neurological: Positive for weakness.  Psychiatric/Behavioral: Positive for confusion.    Physical Exam  General: Alert, awake, in no acute distress.  HEENT: No bruits, no goiter, no JVD Heart: Regular rate and rhythm. No murmur appreciated. Lungs: Good air movement, clear Abdomen: Soft, nontender, nondistended, positive bowel sounds.  Ext: No significant edema Skin: Warm and dry Neuro: Grossly intact, nonfocal.  Vital Signs: BP (!) 162/97   Pulse 100   Temp 99.1 F (37.3 C) (Oral)   Resp (!) 22   Ht _0  (1.702 m)   Wt 54.9 kg (121 lb 0.5 oz)   SpO2 98%   BMI 18.96 kg/m  Pain Assessment: No/denies pain   Pain Score: 0-No pain   SpO2: SpO2: 98 % O2 Device:SpO2: 98 % O2 Flow Rate: .   IO: Intake/output summary:  Intake/Output Summary (Last 24 hours) at 07/01/16 1112 Last data filed at 07/01/16 1104  Gross per 24 hour  Intake  3309.8 ml  Output              625 ml  Net           2684.8 ml    LBM: Last BM Date: 07/01/16 Baseline  Weight: Weight: 54.4 kg (120 lb) Most recent weight: Weight: 54.9 kg (121 lb 0.5 oz)     Palliative Assessment/Data:   Flowsheet Rows     Most Recent Value  Intake Tab  Referral Department  Hospitalist  Unit at Time of Referral  ICU  Palliative Care Primary Diagnosis  Neurology  Date Notified  07/01/16  Palliative Care Type  New Palliative care  Reason for referral  Clarify Goals of Care, Advance Care Planning, Psychosocial or Spiritual support  Date of Admission  06/29/16  Date first seen by Palliative Care  07/01/16  # of days Palliative referral response time  0 Day(s)  # of days IP prior to Palliative referral  2  Clinical Assessment  Palliative Performance Scale Score  40%  Pain Max last 24 hours  5  Pain Min Last 24 hours  0  Psychosocial & Spiritual Assessment  Palliative Care Outcomes  Patient/Family meeting held?  Yes  Who was at the meeting?  Patient, oldest son.  Spoke with oldest daughter via phone  Palliative Care Outcomes  Clarified goals of care, ACP counseling assistance      Time In: 1000 Time Out: 1120 Time Total: 80 Greater than 50%  of this time was spent counseling and coordinating care related to the above assessment and plan.  Signed by: Micheline Rough, MD   Please contact Palliative Medicine Team phone at [phone] for questions and concerns.  For individual provider: See Shea Evans

## 2016-07-01 NOTE — Progress Notes (Signed)
[facility] PROGRESS NOTE    Progress Note  Elizabeth RoupVerna Fennema  AVW:[id number] DOB: 1923/04/04 DOA: 06/29/2016 PCP: Sandford Craze'Sullivan, Melissa, NP     Brief Narrative:   Elizabeth Keller is an 83 y.o. female Past medical history of sarcoid, diabetes mellitus type 2, cyclic decubitus ulcer and heel, Chronic diastolic heart failure and anemia is brought to the ED after patient was having generalized body aches for several days, in the ED she was found to be hypotensive and tachycardic with a normal lactic acid so sepsis pathway was started.  Assessment/Plan:   Sepsis (HCC) Likely due to UTI: Off pressors, blood pressure is improved, off stress dose steroids, and empiric antibiotics. Discontinue IV fluids. Blood pressure continues to improve tachycardia is borderline. Lactic acidosis improving. Discontinue vancomycin, Continue IV Zosyn. Transfer to Tele bed.  Acute encephalopathy likely due to UTI: No resolved.   Chronic kidney disease, stage III (moderate): Creatinine is stable.  Indwelling Foley catheter: Patient was recently discharged home with a Foley cath on 4 2018 due to urinary retention. Foley has been exchange.  Chronic diastolic heart failure: Continue hold diuretics  Failure to thrive: She's been bedbound since February 2018.  Pressure ulcer of unspecified buttock, stage 4 Present on admission she had debridement at bedside of her sacral ulcer on last admission on April. Wound care consulted continue hydrotherapy.  DM2 (diabetes mellitus, type 2) (HCC) Last A1c of 6.6 continue hold metformin. Continue sliding scale insulin.  Protein-calorie malnutrition, severe    DVT prophylaxis: lovenox Family Communication:son Disposition Plan/Barrier to D/C: hopefully in 2 -days Code Status:     Code Status Orders        Start     Ordered   06/30/16 [phone]  Limited resuscitation (code)  Continuous    Comments:  Pressors and BiPAP only, no CPR, cardioversion or  intubation.  Question Answer Comment  In the event of cardiac or respiratory ARREST: Initiate Code Blue, Call Rapid Response No   In the event of cardiac or respiratory ARREST: Perform CPR No   In the event of cardiac or respiratory ARREST: Perform Intubation/Mechanical Ventilation No   In the event of cardiac or respiratory ARREST: Use NIPPV/BiPAp only if indicated Yes   In the event of cardiac or respiratory ARREST: Administer ACLS medications if indicated Yes   In the event of cardiac or respiratory ARREST: Perform Defibrillation or Cardioversion if indicated No      06/30/16 0916    Code Status History    Date Active Date Inactive Code Status Order ID Comments User Context   06/30/2016  1:40 AM 06/30/2016  9:16 AM Full Code [id number]  Eduard ClosKakrakandy, Arshad N, MD Inpatient   05/15/2016  9:20 PM 05/20/2016 10:00 PM DNR [id number]  Jackie Plumsei-Bonsu, George, MD Inpatient   04/04/2016  7:13 AM 04/06/2016  7:06 PM DNR [id number]  Bobette Mortiz, David Manuel, MD Inpatient   04/03/2016 10:15 PM 04/04/2016  7:13 AM DNR [id number]  Bobette Mortiz, David Manuel, MD Inpatient   03/14/2016  9:36 PM 03/19/2016  5:01 PM DNR [id number]  Clydie BraunSmith, Rondell A, MD ED   03/14/2016  8:39 PM 03/14/2016  9:36 PM Full Code [id number]  Clydie BraunSmith, Rondell A, MD ED    Advance Directive Documentation     Most Recent Value  Type of Advance Directive  Healthcare Power of Attorney  Pre-existing out of facility DNR order (yellow form or pink MOST form)  -  "MOST" Form in Place?  -        IV Access:  Peripheral IV   Procedures and diagnostic studies:   Dg Chest 1 View  Result Date: 06/29/2016 CLINICAL DATA:  Chest pain EXAM: CHEST 1 VIEW COMPARISON:  05/15/2016 FINDINGS: Cardiac shadow is stable. Biapical scarring is noted and stable from a prior exam. No new focal infiltrate is seen. No acute bony abnormality is noted. IMPRESSION: Chronic scarring in the apices bilaterally. Electronically Signed   By: Alcide Clever M.D.   On: 06/29/2016 20:12   Dg Abdomen  1 View  Result Date: 06/29/2016 CLINICAL DATA:  Acute onset of generalized abdominal pain and pelvic pain. Initial encounter. EXAM: ABDOMEN - 1 VIEW COMPARISON:  CT of the abdomen and pelvis performed 04/03/2016 FINDINGS: The visualized bowel gas pattern is unremarkable. Scattered air and stool filled loops of colon are seen; no abnormal dilatation of small bowel loops is seen to suggest small bowel obstruction. No free intra-abdominal air is identified, though evaluation for free air is limited on a single supine view. Degenerative change is noted about the pubic symphysis; the sacroiliac joints are unremarkable in appearance. IMPRESSION: Unremarkable bowel gas pattern; no free intra-abdominal air seen. Moderate to large amount of stool noted in the colon. Electronically Signed   By: Roanna Raider M.D.   On: 06/29/2016 20:24     Medical Consultants:    None.  Anti-Infectives:   IV Zosyn.  Subjective:    Elizabeth Roup Patient relates she feels much better.  Objective:    Vitals:   06/30/16 2007 07/01/16 0000 07/01/16 0030 07/01/16 0400  BP:  (!) 157/78  (!) 142/76  Pulse:  (!) 106  99  Resp:  (!) 28  (!) 25  Temp: 99.6 F (37.6 C)  98.5 F (36.9 C) 97.8 F (36.6 C)  TempSrc: Oral  Oral Oral  SpO2:  97%  97%  Weight:    54.9 kg (121 lb 0.5 oz)  Height:        Intake/Output Summary (Last 24 hours) at 07/01/16 0753 Last data filed at 07/01/16 0600  Gross per 24 hour  Intake           3294.8 ml  Output              625 ml  Net           2669.8 ml   Filed Weights   06/30/16 0015 06/30/16 0500 07/01/16 0400  Weight: 53.3 kg (117 lb 8.1 oz) 57.4 kg (126 lb 8.7 oz) 54.9 kg (121 lb 0.5 oz)    Exam: General exam: In no acute distress. Respiratory system: Good air movement and clear to auscultation. Cardiovascular system: Regular rate and rhythm with a positive S1-S2 Gastrointestinal system: Abdomen is soft nondistended Central nervous system: Alert and  oriented. Extremities: No pedal edema. Skin: No rashes or ulceration.   Data Reviewed:    Labs: Basic Metabolic Panel:  Recent Labs Lab 06/29/16 1908 06/30/16 0305 07/01/16 0330  NA 139 142 139  K 3.9 3.7 3.7  CL 106 112* 113*  CO2 22 18* 19*  GLUCOSE 112* 104* 204*  BUN 21* 22* 24*  CREATININE 0.79 0.83 0.83  CALCIUM 9.4 8.7* 8.2*  MG  --  1.6* 2.3   GFR Estimated Creatinine Clearance: 39 mL/min (by C-G formula based on SCr of 0.83 mg/dL). Liver Function Tests:  Recent Labs Lab 06/29/16 1908 06/30/16 0305  AST 16 19  ALT 7* 7*  ALKPHOS 57 55  BILITOT 0.5 1.0  PROT 6.4* 5.9*  ALBUMIN 3.0* 2.9*  No results for input(s): LIPASE, AMYLASE in the last 168 hours. No results for input(s): AMMONIA in the last 168 hours. Coagulation profile  Recent Labs Lab 06/30/16 0516  INR 1.31    CBC:  Recent Labs Lab 06/29/16 1908 06/30/16 0305 07/01/16 0330  WBC 7.0 13.6* 10.7*  NEUTROABS 4.0  --  9.9*  HGB 10.3* 10.3* 9.2*  HCT 31.7* 32.7* 27.4*  MCV 89.0 91.6 89.5  PLT 272 218 223   Cardiac Enzymes:  Recent Labs Lab 06/30/16 0101 06/30/16 0516  TROPONINI <0.03 <0.03   BNP (last 3 results) No results for input(s): PROBNP in the last 8760 hours. CBG:  Recent Labs Lab 06/26/16 1035 06/30/16 0752 06/30/16 1153 06/30/16 1743 06/30/16 2156  GLUCAP 167* 192* 157* 119* 163*   D-Dimer: No results for input(s): DDIMER in the last 72 hours. Hgb A1c: No results for input(s): HGBA1C in the last 72 hours. Lipid Profile: No results for input(s): CHOL, HDL, LDLCALC, TRIG, CHOLHDL, LDLDIRECT in the last 72 hours. Thyroid function studies: No results for input(s): TSH, T4TOTAL, T3FREE, THYROIDAB in the last 72 hours.  Invalid input(s): FREET3 Anemia work up: No results for input(s): VITAMINB12, FOLATE, FERRITIN, TIBC, IRON, RETICCTPCT in the last 72 hours. Sepsis Labs:  Recent Labs Lab 06/29/16 1908 06/29/16 1919 06/30/16 0102 06/30/16 0305  06/30/16 0516 06/30/16 0737 07/01/16 0330  PROCALCITON  --   --   --   --  2.27  --   --   WBC 7.0  --   --  13.6*  --   --  10.7*  LATICACIDVEN  --  1.00 2.4*  --  1.3 1.4  --    Microbiology Recent Results (from the past 240 hour(s))  Aerobic Culture (superficial specimen)     Status: None   Collection Time: 06/22/16 10:30 AM  Result Value Ref Range Status   Specimen Description FOOT RIGHT CALCANEOUS  Final   Special Requests NONE  Final   Gram Stain   Final    FEW WBC PRESENT, PREDOMINANTLY MONONUCLEAR NO ORGANISMS SEEN    Culture   Final    NORMAL SKIN FLORA Performed at [facility], [address], [place], [place] [address]    Report Status 06/25/2016 FINAL  Final  Urine culture     Status: Abnormal   Collection Time: 06/29/16  7:47 PM  Result Value Ref Range Status   Specimen Description URINE, RANDOM  Final   Special Requests NONE  Final   Culture MULTIPLE SPECIES PRESENT, SUGGEST RECOLLECTION (A)  Final   Report Status 07/01/2016 FINAL  Final  Blood culture (routine x 2)     Status: None (Preliminary result)   Collection Time: 06/29/16  8:50 PM  Result Value Ref Range Status   Specimen Description BLOOD BLOOD RIGHT FOREARM  Final   Special Requests IN PEDIATRIC BOTTLE Blood Culture adequate volume  Final   Culture   Final    NO GROWTH < 24 HOURS Performed at [facility], [address], [place], [place] [address]    Report Status PENDING  Incomplete  MRSA PCR Screening     Status: None   Collection Time: 06/30/16  1:36 AM  Result Value Ref Range Status   MRSA by PCR NEGATIVE NEGATIVE Final    Comment:        The GeneXpert MRSA Assay (FDA approved for NASAL specimens only), is one component of a comprehensive MRSA colonization surveillance program. It is not intended to  diagnose MRSA infection nor to guide or monitor treatment for MRSA infections.      Medications:   . aspirin EC  81 mg Oral Daily  . bisacodyl  10 mg Rectal Daily   . cholecalciferol  1,000 Units Oral Daily  . enoxaparin (LOVENOX) injection  40 mg Subcutaneous Q24H  . feeding supplement (ENSURE ENLIVE)  237 mL Oral TID BM  . hydrocortisone sod succinate (SOLU-CORTEF) inj  50 mg Intravenous Q6H  . insulin aspart  0-9 Units Subcutaneous TID WC  . mouth rinse  15 mL Mouth Rinse BID  . multivitamin with minerals  1 tablet Oral Daily  . pantoprazole  40 mg Oral Daily  . polyethylene glycol  17 g Oral Daily  . senna-docusate  1 tablet Oral BID  . simvastatin  40 mg Oral QHS   Continuous Infusions: . phenylephrine (NEO-SYNEPHRINE) Adult infusion Stopped (06/30/16 1420)  . piperacillin-tazobactam (ZOSYN)  IV 3.375 g (07/01/16 0453)  . vancomycin      Time spent: 25 min   LOS: 1 day   Marinda Elk  [facility] Pager [phone]  *Please refer to [email or web address], password [email or web address] to get updated schedule on who will round on this patient, as hospitalists switch teams weekly. If 7PM-7AM, please contact night-coverage at [email or web address], password [email or web address] for any overnight needs.  07/01/2016, 7:53 AM

## 2016-07-01 NOTE — Progress Notes (Signed)
CSW consulted for SNF placement. Palliative Care Team consulted for end of life care. CSW will review Palliative Care recommendations prior to pursuing SNF placement.   Cori RazorJamie Minh Roanhorse LCSW [phone]

## 2016-07-01 NOTE — NC FL2 (Signed)
[place] MEDICAID FL2 LEVEL OF CARE SCREENING TOOL     IDENTIFICATION  Patient Name: Elizabeth Keller Birthdate: [birth year] Sex: female Admission Date (Current Location): 06/29/2016  [facility] and [place] Number:  Producer, television/film/video and Address:  [facility],  [address] [place], [place] [address]      Provider Number: [id number]  Attending Physician Name and Address:  Marinda Elk, MD  Relative Name and Phone Number:       Current Level of Care: Hospital Recommended Level of Care: Skilled Nursing Facility Prior Approval Number:    Date Approved/Denied:   PASRR Number: [id number]  Discharge Plan: SNF    Current Diagnoses: Patient Active Problem List   Diagnosis Date Noted  . Protein-calorie malnutrition, severe 06/30/2016  . Infective urethritis   . Sepsis (HCC) 06/29/2016  . Severe sepsis (HCC) 05/15/2016  . Pressure ulcer of unspecified buttock, stage 2 04/04/2016  . Generalized abdominal pain 04/04/2016  . DM2 (diabetes mellitus, type 2) (HCC) 04/04/2016  . Hyperkalemia 04/03/2016  . Chronic kidney disease, stage III (moderate) 03/14/2016    Orientation RESPIRATION BLADDER Height & Weight     Self, Situation, Place    Indwelling catheter Weight: 121 lb 0.5 oz (54.9 kg) Height:  5\' 7"  (170.2 cm)  BEHAVIORAL SYMPTOMS/MOOD NEUROLOGICAL BOWEL NUTRITION STATUS  Other (Comment) (no behaviors)   Incontinent Diet (heart healthy)  AMBULATORY STATUS COMMUNICATION OF NEEDS Skin   Extensive Assist Verbally Hydro Therapy, Other (Comment) (Stge 2 pressure ulcer on right heel, unstageable ulcer on left heel, Sttage 5 ulcer on sacrum)                       Personal Care Assistance Level of Assistance  Bathing, Feeding, Dressing Bathing Assistance: Maximum assistance Feeding assistance: Limited assistance Dressing Assistance: Maximum assistance     Functional Limitations Info  Sight, Hearing, Speech Sight Info: Adequate Hearing Info:  Adequate Speech Info: Adequate    SPECIAL CARE FACTORS FREQUENCY   (PT eval pending)                    Contractures Contractures Info: Not present    Additional Factors Info  Code Status Code Status Info: Parial code             Current Medications (07/01/2016):  This is the current hospital active medication list Current Facility-Administered Medications  Medication Dose Route Frequency Provider Last Rate Last Dose  . acetaminophen (TYLENOL) tablet 650 mg  650 mg Oral Q6H PRN Eduard Clos, MD   650 mg at 06/30/16 1658   Or  . acetaminophen (TYLENOL) suppository 650 mg  650 mg Rectal Q6H PRN Eduard Clos, MD      . albuterol (PROVENTIL) (2.5 MG/3ML) 0.083% nebulizer solution 2.5 mg  2.5 mg Nebulization Q6H PRN Eduard Clos, MD      . aspirin EC tablet 81 mg  81 mg Oral Daily Eduard Clos, MD   81 mg at 07/01/16 5621  . bisacodyl (DULCOLAX) suppository 10 mg  10 mg Rectal Daily Albertine Grates, MD   10 mg at 07/01/16 1117  . cholecalciferol (VITAMIN D) tablet 1,000 Units  1,000 Units Oral Daily Eduard Clos, MD   1,000 Units at 07/01/16 3086  . clonazePAM (KLONOPIN) tablet 0.5 mg  0.5 mg Oral TID PRN Albertine Grates, MD   0.5 mg at 07/01/16 0453  . enoxaparin (LOVENOX) injection 40 mg  40 mg Subcutaneous Q24H Toniann Fail,  Meryle ReadyArshad N, MD   40 mg at 07/01/16 0917  . feeding supplement (ENSURE ENLIVE) (ENSURE ENLIVE) liquid 237 mL  237 mL Oral TID BM Eduard ClosKakrakandy, Arshad N, MD   237 mL at 07/01/16 0918  . hydrocortisone sodium succinate (SOLU-CORTEF) 100 MG injection 50 mg  50 mg Intravenous Q6H Albertine GratesXu, Fang, MD   50 mg at 07/01/16 0900  . insulin aspart (novoLOG) injection 0-9 Units  0-9 Units Subcutaneous TID WC Eduard ClosKakrakandy, Arshad N, MD   3 Units at 07/01/16 0900  . MEDLINE mouth rinse  15 mL Mouth Rinse BID Albertine GratesXu, Fang, MD   15 mL at 07/01/16 0919  . multivitamin with minerals tablet 1 tablet  1 tablet Oral Daily Eduard ClosKakrakandy, Arshad N, MD   1 tablet at 07/01/16  [id number]  . pantoprazole (PROTONIX) EC tablet 40 mg  40 mg Oral Daily Eduard ClosKakrakandy, Arshad N, MD   40 mg at 07/01/16 [id number]  . phenylephrine (NEOSYNEPHRINE) 10-0.9 MG/250ML-% infusion  0-100 mcg/min Intravenous Titrated Tobey GrimEubanks, Katalina M, NP   Stopped at 06/30/16 1420  . piperacillin-tazobactam (ZOSYN) IVPB 3.375 g  3.375 g Intravenous Q8H Runyon, Amanda M, RPH      . polyethylene glycol (MIRALAX / GLYCOLAX) packet 17 g  17 g Oral Daily Albertine GratesXu, Fang, MD      . senna-docusate (Senokot-S) tablet 1 tablet  1 tablet Oral BID Albertine GratesXu, Fang, MD   1 tablet at 07/01/16 [phone]  . simvastatin (ZOCOR) tablet 40 mg  40 mg Oral QHS Eduard ClosKakrakandy, Arshad N, MD   40 mg at 06/30/16 2156     Discharge Medications: Please see discharge summary for a list of discharge medications.  Relevant Imaging Results:  Relevant Lab Results:   Additional Information [id number]  Virdell Hoiland, Dickey GaveJamie Lee, LCSW

## 2016-07-01 NOTE — Progress Notes (Signed)
CSW received a call from pt's RN requesting assistance with a POA.  CSW noted in pt's notes Chaplain requested RN page the chaplain when the family was ready to complete POA.  CSW paged the chaplain and requested RN place a consult for the chaplain at this time.  Please reconsult if future social work needs arise.    Dorothe PeaJonathan F. Drequan Ironside, Theresia MajorsLCSWA, LCAS Clinical Social Worker Ph: [phone]

## 2016-07-01 NOTE — Progress Notes (Signed)
Patient's family called nurses station requesting RN to come check on the patient. Therapist, sportsCharge nurse and writer went in the room . Patient is alert and oriented x 4. Vital signs stable. Patient is able to smile symmetrically, raise both hands in the air and had strong grip bilaterally. Rapid response called and is at bedside.

## 2016-07-01 NOTE — Progress Notes (Signed)
Chaplain completed advance directive - health care power of attorney - with patient.   Pt was alert and oriented and demonstrated such in front of chaplain and two witnesses.  Directed daughter to sign on her behalf.  Document notarized.  Family with original and copies at pt's request.  Copy placed in pt chart.     [facility] Chaplain Burnis KingfisherMatthew Iosefa Weintraub  Page [phone] Office [phone]

## 2016-07-01 NOTE — Significant Event (Signed)
Rapid Response Event Note  Overview: Time Called: 1415 Arrival Time: 1420 Event Type: Neurologic  Initial Focused Assessment: Called By Bedside RN in regards to family member believing that patient possibly had a stroke. When arrived, patient was awake and alert. The floor NT was taking vitals at this time. Patient was able to follow commands as well. I asked the family member to recall what she had saw. She said "She walked on the patient sleeping, head turned to side and patient had mouth opened". She concerned about the patient drooling and the position of her mouth.  Neuro: Pupils round, equal and intact. Patient able to squeeze/grip my hands. The grip was weak bilaterally on both sides equally. Patient was able to wiggle toes as well. Patient was oriented to self and new that she was a the hospital. Patient's face was symmetrical with smile and eyebrow raise.   Patient had been transferred from the ICU/SDU earlier today. Brought Threasa AlphaMurphy Makar Slatter NT, who assisted in transferring patient earlier, alongside. Threasa AlphaMurphy Berma Harts NT said that patient was appearing the same as earlier in regards to talking and behavior.   Vital Signs taken by NT at beside: BP 136/66 HR 101, RR 20, SPo2 100% on Room Air, Temp 98.2 F Interventions: Educated patient family in regards signs/symptoms of Stroke. Eased family member concern in regards to event.   Plan of Care (if not transferred): Talked to bedside RN in regards to event. Continue to monitor patient per protocol. Call RRT RN if patient has any neuro changes.             Asti Mackley C

## 2016-07-01 NOTE — Progress Notes (Signed)
CSW assisting with psychosocial concerns / d/c planning. CSW &  Dr. Domingo Cocking met with pt / oldest son at bedside this am. Pt clearly reported that she would like her daughter, [name], to be her health care decision maker. Pt was able to report why she felt this daughter was most appropriate to take on this role. Pt is willing to complete new HCPOA assigning her daughter Vaughan Basta as her health care agent. [employer] contacted and will assist with HCPOA .   Pt also reported that she is in agreement with rehab placement. Pt asked appropriate questions relating to dc planning ( where will I go, how long will I stay, can I have visitors ). Hydrotherapy has been ordered. [facility] is the only SNF in [facility] that offers hydrotherapy and referral has been provided to that SNF. Pt / oldest son are aware. A decision from SNF is pending. CSW will update pt / family once a decision has been made.  CSW will continue to follow to assist with d/c planning needs.  Werner Lean LCSW [phone]

## 2016-07-01 NOTE — Progress Notes (Signed)
CSW contacted Chaplain so he is aware pt's family has requested Chaplain at this time for POA needs.  CSW signing off. Please reconsult if future social work needs arise.    Dorothe PeaJonathan F. Roshun Klingensmith, Theresia MajorsLCSWA, LCAS Clinical Social Worker Ph: [phone]

## 2016-07-01 NOTE — Evaluation (Signed)
Physical Therapy Evaluation Patient Details Name: Elizabeth RoupVerna Closson MRN: [id number] DOB: 07/26/1923 Today's Date: 07/01/2016   History of Present Illness  83 y.o. female Past medical history of sarcoid, diabetes mellitus type 2, sacral decubitus and pressure injuries to bilateral heels, Chronic diastolic heart failure and anemia and admitted for sepsis likely due to UTI  Clinical Impression  Pt admitted with above diagnosis. Pt currently with functional limitations due to the deficits listed below (see PT Problem List).  Pt will benefit from skilled PT to increase their independence and safety with mobility to allow discharge to the venue listed below.   Pt presents with generalized weakness, deconditioning, decreased mobility, and multiple pressure injuries.  Family reports plan is for rehab at SNF at this time.     Follow Up Recommendations SNF    Equipment Recommendations  None recommended by PT    Recommendations for Other Services       Precautions / Restrictions Precautions Precautions: Fall      Mobility  Bed Mobility Overal bed mobility: Needs Assistance Bed Mobility: Rolling Rolling: Total assist;+2 for physical assistance         General bed mobility comments: pt requires total assist for rolling, encouraged pt to assist with upper body however pt unable to reach over for rail, requires total assist for repositioning, does not like HOB lowered (prefers trunk/hip flexion)  Transfers                 General transfer comment: not assessed today, pt fatigued, also received hydrotherapy  Ambulation/Gait                Stairs            Wheelchair Mobility    Modified Rankin (Stroke Patients Only)       Balance                                             Pertinent Vitals/Pain Pain Assessment: No/denies pain (only pain with repositioning)    Home Living Family/patient expects to be discharged to:: Private  residence Living Arrangements: Children Available Help at Discharge: Family;Available PRN/intermittently Type of Home: House Home Access: Stairs to enter     Home Layout: One level Home Equipment: Environmental consultantWalker - 4 wheels;Wheelchair - [employer] Corporationmanual;Bedside commode;Hospital bed      Prior Function Level of Independence: Needs assistance   Gait / Transfers Assistance Needed: daughter Fannie KneeSue states that pt has been living with her sister, pt has not been out of bed since her fall in February, did not receive PT      Comments: pt was able to ambulate and perform ADLs prior to admission in February     Hand Dominance        Extremity/Trunk Assessment   Upper Extremity Assessment Upper Extremity Assessment: Generalized weakness    Lower Extremity Assessment Lower Extremity Assessment: RLE deficits/detail;LLE deficits/detail RLE Deficits / Details: pressure injuries bilateral heel, R LE tends to rest in internal rotation, difficulty tolerating external rotation, prefers flexion pattern,  requires assist for positioning LLE Deficits / Details: pressure injuries bilateral heel, prefers flexion pattern, requires assist for positioning       Communication   Communication: No difficulties  Cognition Arousal/Alertness: Awake/alert Behavior During Therapy: WFL for tasks assessed/performed Overall Cognitive Status: Within Functional Limits for tasks assessed  General Comments      Exercises     Assessment/Plan    PT Assessment Patient needs continued PT services  PT Problem List Decreased strength;Decreased mobility;Decreased activity tolerance;Decreased balance;Decreased skin integrity       PT Treatment Interventions DME instruction;Therapeutic exercise;Therapeutic activities;Functional mobility training;Patient/family education;Wheelchair mobility training    PT Goals (Current goals can be found in the Care Plan section)  Acute  Rehab PT Goals Patient Stated Goal: pt would like to be able to walk again PT Goal Formulation: With patient/family Time For Goal Achievement: 07/15/16 Potential to Achieve Goals: Fair    Frequency Min 2X/week   Barriers to discharge        Co-evaluation               AM-PAC PT "6 Clicks" Daily Activity  Outcome Measure Difficulty turning over in bed (including adjusting bedclothes, sheets and blankets)?: Total Difficulty moving from lying on back to sitting on the side of the bed? : Total Difficulty sitting down on and standing up from a chair with arms (e.g., wheelchair, bedside commode, etc,.)?: Total Help needed moving to and from a bed to chair (including a wheelchair)?: Total Help needed walking in hospital room?: Total Help needed climbing 3-5 steps with a railing? : Total 6 Click Score: 6    End of Session   Activity Tolerance: Patient tolerated treatment well Patient left: in bed;with call bell/phone within reach;with family/visitor present Nurse Communication: Mobility status (also requested air mattress due to multiple pressure injuries) PT Visit Diagnosis: Muscle weakness (generalized) (M62.81)    Time: 1500-1510 PT Time Calculation (min) (ACUTE ONLY): 10 min   Charges:   PT Evaluation $PT Eval Low Complexity: 1 Procedure $PT Eval Moderate Complexity: 1 Procedure     PT G CodesZenovia Jarred, PT, DPT 07/01/2016 Pager: [phone]   Maida Sale E 07/01/2016, 4:35 PM

## 2016-07-02 DIAGNOSIS — I950 Idiopathic hypotension: Secondary | ICD-10-CM

## 2016-07-02 LAB — GLUCOSE, CAPILLARY
GLUCOSE-CAPILLARY: 214 mg/dL — AB (ref 65–99)
GLUCOSE-CAPILLARY: 227 mg/dL — AB (ref 65–99)
GLUCOSE-CAPILLARY: 195 mg/dL — AB (ref 65–99)
Glucose-Capillary: 215 mg/dL — ABNORMAL HIGH (ref 65–99)

## 2016-07-02 MED ORDER — SULFAMETHOXAZOLE-TRIMETHOPRIM 800-160 MG PO TABS
1.0000 | ORAL_TABLET | Freq: Two times a day (BID) | ORAL | Status: DC
  Administered 2016-07-02 – 2016-07-03 (×3): 1 via ORAL
  Filled 2016-07-02 (×3): qty 1

## 2016-07-02 MED ORDER — TORSEMIDE 5 MG PO TABS
5.0000 mg | ORAL_TABLET | Freq: Every day | ORAL | Status: DC
  Administered 2016-07-02 – 2016-07-03 (×2): 5 mg via ORAL
  Filled 2016-07-02 (×3): qty 1

## 2016-07-02 MED ORDER — PREDNISONE 10 MG PO TABS
10.0000 mg | ORAL_TABLET | Freq: Every day | ORAL | Status: DC
  Administered 2016-07-03: 10 mg via ORAL
  Filled 2016-07-02: qty 1

## 2016-07-02 MED ORDER — MUSCLE RUB 10-15 % EX CREA
TOPICAL_CREAM | CUTANEOUS | Status: DC | PRN
  Filled 2016-07-02: qty 85

## 2016-07-02 NOTE — Progress Notes (Signed)
[facility] PROGRESS NOTE    Progress Note  Elizabeth RoupVerna Kable  ZOX:[id number] DOB: [birth year] DOA: 06/29/2016 PCP: Sandford Craze'Sullivan, Melissa, NP     Brief Narrative:   Elizabeth Keller is an 83 y.o. female Past medical history of sarcoid, diabetes mellitus type 2, cyclic decubitus ulcer and heel, Chronic diastolic heart failure and anemia is brought to the ED after patient was having generalized body aches for several days, in the ED she was found to be hypotensive and tachycardic with a normal lactic acid so sepsis pathway was started.  Assessment/Plan:   Sepsis (HCC) Likely due to UTI: Off pressors, blood pressure is improved, titrating down stress dose steroids, and empiric antibiotics. Blood pressure continues to improve tachycardia is Resolved Lactic acidosis is resolved. Discontinue IV Zosyn. Start her on oral Bactrim, will monitor 24 hours on oral Bactrim.  Acute encephalopathy likely due to UTI: No resolved.   Chronic kidney disease, stage III (moderate): Creatinine is stable.  Indwelling Foley catheter:  Chronic diastolic heart failure: Resume home dose of diuretics.  Failure to thrive: She's been bedbound since February 2018.  Pressure ulcer of unspecified buttock, stage 4 Wound care consulted continue hydrotherapy.  DM2 (diabetes mellitus, type 2) (HCC) Last A1c of 6.6 continue hold metformin. Continue sliding scale insulin.  Protein-calorie malnutrition, severe    DVT prophylaxis: lovenox Family Communication:son Disposition Plan/Barrier to D/C: hopefully in 1 -days Code Status:     Code Status Orders        Start     Ordered   06/30/16 [phone]  Limited resuscitation (code)  Continuous    Comments:  Pressors and BiPAP only, no CPR, cardioversion or intubation.  Question Answer Comment  In the event of cardiac or respiratory ARREST: Initiate Code Blue, Call Rapid Response No   In the event of cardiac or respiratory ARREST: Perform CPR No   In the event  of cardiac or respiratory ARREST: Perform Intubation/Mechanical Ventilation No   In the event of cardiac or respiratory ARREST: Use NIPPV/BiPAp only if indicated Yes   In the event of cardiac or respiratory ARREST: Administer ACLS medications if indicated Yes   In the event of cardiac or respiratory ARREST: Perform Defibrillation or Cardioversion if indicated No      06/30/16 0916    Code Status History    Date Active Date Inactive Code Status Order ID Comments User Context   06/30/2016  1:40 AM 06/30/2016  9:16 AM Full Code [id number]  Eduard ClosKakrakandy, Arshad N, MD Inpatient   05/15/2016  9:20 PM 05/20/2016 10:00 PM DNR [id number]  Jackie Plumsei-Bonsu, George, MD Inpatient   04/04/2016  7:13 AM 04/06/2016  7:06 PM DNR [id number]  Bobette Mortiz, David Manuel, MD Inpatient   04/03/2016 10:15 PM 04/04/2016  7:13 AM DNR [id number]  Bobette Mortiz, David Manuel, MD Inpatient   03/14/2016  9:36 PM 03/19/2016  5:01 PM DNR [id number]  Clydie BraunSmith, Rondell A, MD ED   03/14/2016  8:39 PM 03/14/2016  9:36 PM Full Code [id number]  Clydie BraunSmith, Rondell A, MD ED    Advance Directive Documentation     Most Recent Value  Type of Advance Directive  Healthcare Power of Attorney  Pre-existing out of facility DNR order (yellow form or pink MOST form)  -  "MOST" Form in Place?  -        IV Access:    Peripheral IV   Procedures and diagnostic studies:   No results found.   Medical Consultants:    None.  Anti-Infectives:   IV Zosyn.  Subjective:    Syrai Gladwin No new complains  Objective:    Vitals:   07/01/16 1106 07/01/16 1146 07/01/16 1411 07/01/16 2107  BP: (!) 162/97 (!) 153/80 132/66 (!) 146/80  Pulse: 100 (!) 101 (!) 101 98  Resp: (!) 22 20 20    Temp:  98.4 F (36.9 C) 98.2 F (36.8 C) 98.4 F (36.9 C)  TempSrc:  Oral Oral Oral  SpO2: 98% 99% 100% 99%  Weight:      Height:        Intake/Output Summary (Last 24 hours) at 07/02/16 0826 Last data filed at 07/01/16 2108  Gross per 24 hour  Intake              400 ml  Output               500 ml  Net             -100 ml   Filed Weights   06/30/16 0015 06/30/16 0500 07/01/16 0400  Weight: 53.3 kg (117 lb 8.1 oz) 57.4 kg (126 lb 8.7 oz) 54.9 kg (121 lb 0.5 oz)    Exam: General exam: In no acute distress. Respiratory system: Good air movement and clear to auscultation. Cardiovascular system: Regular rate and rhythm with a positive S1-S2 Gastrointestinal system: Abdomen is soft nondistended Central nervous system: Alert and oriented. Extremities: No pedal edema. Skin: No rashes or ulceration.   Data Reviewed:    Labs: Basic Metabolic Panel:  Recent Labs Lab 06/29/16 1908 06/30/16 0305 07/01/16 0330  NA 139 142 139  K 3.9 3.7 3.7  CL 106 112* 113*  CO2 22 18* 19*  GLUCOSE 112* 104* 204*  BUN 21* 22* 24*  CREATININE 0.79 0.83 0.83  CALCIUM 9.4 8.7* 8.2*  MG  --  1.6* 2.3   GFR Estimated Creatinine Clearance: 39 mL/min (by C-G formula based on SCr of 0.83 mg/dL). Liver Function Tests:  Recent Labs Lab 06/29/16 1908 06/30/16 0305  AST 16 19  ALT 7* 7*  ALKPHOS 57 55  BILITOT 0.5 1.0  PROT 6.4* 5.9*  ALBUMIN 3.0* 2.9*   No results for input(s): LIPASE, AMYLASE in the last 168 hours. No results for input(s): AMMONIA in the last 168 hours. Coagulation profile  Recent Labs Lab 06/30/16 0516  INR 1.31    CBC:  Recent Labs Lab 06/29/16 1908 06/30/16 0305 07/01/16 0330  WBC 7.0 13.6* 10.7*  NEUTROABS 4.0  --  9.9*  HGB 10.3* 10.3* 9.2*  HCT 31.7* 32.7* 27.4*  MCV 89.0 91.6 89.5  PLT 272 218 223   Cardiac Enzymes:  Recent Labs Lab 06/30/16 0101 06/30/16 0516  TROPONINI <0.03 <0.03   BNP (last 3 results) No results for input(s): PROBNP in the last 8760 hours. CBG:  Recent Labs Lab 06/30/16 2156 07/01/16 0817 07/01/16 1144 07/01/16 1658 07/02/16 0749  GLUCAP 163* 214* 172* 237* 227*   D-Dimer: No results for input(s): DDIMER in the last 72 hours. Hgb A1c: No results for input(s): HGBA1C in the last 72  hours. Lipid Profile: No results for input(s): CHOL, HDL, LDLCALC, TRIG, CHOLHDL, LDLDIRECT in the last 72 hours. Thyroid function studies: No results for input(s): TSH, T4TOTAL, T3FREE, THYROIDAB in the last 72 hours.  Invalid input(s): FREET3 Anemia work up: No results for input(s): VITAMINB12, FOLATE, FERRITIN, TIBC, IRON, RETICCTPCT in the last 72 hours. Sepsis Labs:  Recent Labs Lab 06/29/16 1908 06/29/16 1919 06/30/16 0102 06/30/16 0305 06/30/16 0516 06/30/16 0737 07/01/16 0330  PROCALCITON  --   --   --   --  2.27  --   --   WBC 7.0  --   --  13.6*  --   --  10.7*  LATICACIDVEN  --  1.00 2.4*  --  1.3 1.4  --    Microbiology Recent Results (from the past 240 hour(s))  Aerobic Culture (superficial specimen)     Status: None   Collection Time: 06/22/16 10:30 AM  Result Value Ref Range Status   Specimen Description FOOT RIGHT CALCANEOUS  Final   Special Requests NONE  Final   Gram Stain   Final    FEW WBC PRESENT, PREDOMINANTLY MONONUCLEAR NO ORGANISMS SEEN    Culture   Final    NORMAL SKIN FLORA Performed at [facility], [address], [place], [place] [address]    Report Status 06/25/2016 FINAL  Final  Urine culture     Status: Abnormal   Collection Time: 06/29/16  7:47 PM  Result Value Ref Range Status   Specimen Description URINE, RANDOM  Final   Special Requests NONE  Final   Culture MULTIPLE SPECIES PRESENT, SUGGEST RECOLLECTION (A)  Final   Report Status 07/01/2016 FINAL  Final  Blood culture (routine x 2)     Status: None (Preliminary result)   Collection Time: 06/29/16  8:50 PM  Result Value Ref Range Status   Specimen Description BLOOD BLOOD RIGHT FOREARM  Final   Special Requests IN PEDIATRIC BOTTLE Blood Culture adequate volume  Final   Culture   Final    NO GROWTH 2 DAYS Performed at [facility], [address] [place], [place] [address]    Report Status PENDING  Incomplete  Blood culture (routine x 2)     Status: None  (Preliminary result)   Collection Time: 06/30/16  1:01 AM  Result Value Ref Range Status   Specimen Description BLOOD LEFT HAND  Final   Special Requests IN PEDIATRIC BOTTLE Blood Culture adequate volume  Final   Culture   Final    NO GROWTH 1 DAY Performed at [facility], [address], [place], [place] [address]    Report Status PENDING  Incomplete  MRSA PCR Screening     Status: None   Collection Time: 06/30/16  1:36 AM  Result Value Ref Range Status   MRSA by PCR NEGATIVE NEGATIVE Final    Comment:        The GeneXpert MRSA Assay (FDA approved for NASAL specimens only), is one component of a comprehensive MRSA colonization surveillance program. It is not intended to diagnose MRSA infection nor to guide or monitor treatment for MRSA infections.      Medications:   . aspirin EC  81 mg Oral Daily  . bisacodyl  10 mg Rectal Daily  . cholecalciferol  1,000 Units Oral Daily  . enoxaparin (LOVENOX) injection  40 mg Subcutaneous Q24H  . feeding supplement (ENSURE ENLIVE)  237 mL Oral TID BM  . hydrocortisone sod succinate (SOLU-CORTEF) inj  50 mg Intravenous Q6H  . insulin aspart  0-9 Units Subcutaneous TID WC  . mouth rinse  15 mL Mouth Rinse BID  . multivitamin with minerals  1 tablet Oral Daily  . pantoprazole  40 mg Oral Daily  . polyethylene glycol  17 g Oral Daily  . senna-docusate  1 tablet Oral BID  . simvastatin  40 mg Oral QHS   Continuous Infusions: . phenylephrine (NEO-SYNEPHRINE) Adult infusion Stopped (06/30/16 1420)  . piperacillin-tazobactam (ZOSYN)  IV 3.375 g (07/02/16 0600)  Time spent: 25 min   LOS: 2 days   Marinda Elk  [facility] Pager [phone]  *Please refer to [email or web address], password [email or web address] to get updated schedule on who will round on this patient, as hospitalists switch teams weekly. If 7PM-7AM, please contact night-coverage at [email or web address], password [email or web address] for any overnight needs.  07/02/2016, 8:26 AM

## 2016-07-02 NOTE — Progress Notes (Signed)
Physical Therapy Wound Treatment Patient Details  Name: Simmie Garin MRN: [id number] Date of Birth: 1923/09/13  Today's Date: 07/02/2016 Time: 4098-1191 Time Calculation (min): 30 min  Subjective  Subjective: Pt requesting not to lift R LE too high (positioning for hydro) and requesting to sit EOB. Patient and Family Stated Goals: Family states pt wishes to go to rehab and be able to walk again. Prior Treatments: none known to heels  Pain Score:  Pt only c/o pain with repositioning  Wound Assessment                                                                                                                                                                   Pressure Injury 07/01/16 Stage III -  Full thickness tissue loss. Subcutaneous fat may be visible but bone, tendon or muscle are NOT exposed. PT hydrotherapy (Active)  Dressing Type Gauze (Comment);Moist to dry;Foam 07/02/2016  2:30 PM  Dressing Changed 07/02/2016  2:30 PM  Dressing Change Frequency PRN 07/02/2016  2:30 PM  State of Healing Eschar 07/02/2016  2:30 PM  Site / Wound Assessment Yellow;Black 07/02/2016  2:30 PM  % Wound base Red or Granulating 10% 07/02/2016  2:30 PM  % Wound base Yellow/Fibrinous Exudate 75% 07/02/2016  2:30 PM  % Wound base Black/Eschar 15% 07/02/2016  2:30 PM  Wound Length (cm) 3.5 cm 07/01/2016  3:00 PM  Wound Width (cm) 5.5 cm 07/01/2016  3:00 PM  Margins Unattached edges (unapproximated) 07/02/2016  2:30 PM  Drainage Amount Moderate 07/02/2016  2:30 PM  Drainage Description Serosanguineous 07/02/2016  2:30 PM  Treatment Debridement (Selective);Hydrotherapy (Pulse lavage) 07/02/2016  2:30 PM   Hydrotherapy Pulsed lavage therapy - wound location: Right heel Pulsed Lavage with Suction (psi): 8 psi Pulsed Lavage with Suction - Normal Saline Used: 1000 mL Pulsed Lavage Tip: Tip with splash shield Selective Debridement Selective Debridement - Location: Right  heel Selective Debridement - Tools Used: Scissors;Forceps Selective Debridement - Tissue Removed: slough, eschar   Wound Assessment and Plan  Wound Therapy - Assess/Plan/Recommendations Wound Therapy - Clinical Statement: Pt presents with right heel stage III pressure injury which would benefit from hydrotherapy in order to reduce bioburden and promote wound healing.  Left heel also with unstageable pressure injury however this is covered in black eschar.  Do not feel L heel would benefit from hydrotherapy at this time as injury is stable and covered with eschar.  If L heel opens or begins to drain, will reassess need for hydrotherapy at that time. Wound Therapy - Functional Problem List: Pt has been bedbound since February, also with sacral pressure injury and sDTI to right trochanter. Factors Delaying/Impairing Wound Healing: Diabetes Mellitus;Immobility Hydrotherapy Plan: Debridement;Dressing change;Patient/family education;Pulsatile lavage with suction Wound Therapy - Frequency: 6X /  week Wound Therapy - Follow Up Recommendations: Skilled nursing facility Wound Plan: Plan to perform hydrotherapy to right heel to promote wound heel and remove nonviable tissue.   5/24 able to remove final piece of eschar from wound bed and beginning to have more depth in inferior medial area; wound bed now mostly slough  Wound Therapy Goals- Improve the function of patient's integumentary system by progressing the wound(s) through the phases of wound healing (inflammation - proliferation - remodeling) by: Decrease Necrotic Tissue to: 80 Decrease Necrotic Tissue - Progress: Progressing toward goal Increase Granulation Tissue to: 20 Increase Granulation Tissue - Progress: Progressing toward goal Improve Drainage Characteristics: Min Improve Drainage Characteristics - Progress: Progressing toward goal Goals/treatment plan/discharge plan were made with and agreed upon by patient/family: Yes Time For Goal  Achievement: 2 weeks Wound Therapy - Potential for Goals: Fair  Goals will be updated until maximal potential achieved or discharge criteria met.  Discharge criteria: when goals achieved, discharge from hospital, MD decision/surgical intervention, no progress towards goals, refusal/missing three consecutive treatments without notification or medical reason.  GP     Dechelle Attaway,KATHrine E 07/02/2016, 2:41 PM Carmelia Bake, PT, DPT 07/02/2016 Pager: [phone]

## 2016-07-02 NOTE — Progress Notes (Signed)
Inpatient Diabetes Program Recommendations  AACE/ADA: New Consensus Statement on Inpatient Glycemic Control (2015)  Target Ranges:  Prepandial:   less than 140 mg/dL      Peak postprandial:   less than 180 mg/dL (1-2 hours)      Critically ill patients:  140 - 180 mg/dL   Results for Elizabeth Keller, Elizabeth Keller (MRN [id number]) as of 07/02/2016 09:12  Ref. Range 07/01/2016 08:17 07/01/2016 11:44 07/01/2016 16:58 07/02/2016 07:49  Glucose-Capillary Latest Ref Range: 65 - 99 mg/dL 562214 (H) 130172 (H) 865237 (H) 227 (H)    Home DM Meds: Metformin 500 mg Daily  Current Insulin Orders: Novolog Sensitive Correction Scale/ SSI (0-9 units) TID AC      MD- Note patient currently getting Solucortef 50 mg Q6 hours.  Having CBGs >200 mg/dl.  Please consider starting low dose basal insulin for this patient to help with glucose management if within goals of care:  Lantus 5 units daily to start (0.1 units/kg dosing)      --Will follow patient during hospitalization--  Ambrose FinlandJeannine Johnston Dereck Agerton RN, MSN, CDE Diabetes Coordinator Inpatient Glycemic Control Team Team Pager: [phone] (8a-5p)

## 2016-07-02 NOTE — Consult Note (Signed)
WOC Nurse wound follow up Wound type: Stage 4 to sacrum.  Unstageable pressure injury to bilateral heels.  Is currently receiving Hydrotherapy to right heel and monitoring dry eschar to left heel.  It is dry with no drainage.  Dressing procedure/placement/frequency:Continue wound care as ordered.  No further wound care needs at this time.  Family (SON) at bedside.  Will not follow at this time.  Please re-consult if needed.  Maple HudsonKaren Royelle Hinchman RN BSN [facility] Pager [phone]

## 2016-07-02 NOTE — Progress Notes (Signed)
Physical Therapy Treatment Patient Details Name: Elizabeth RoupVerna Keller MRN: [id number] DOB: [birth year] Today's Date: 07/02/2016    History of Present Illness 83 y.o. female Past medical history of sarcoid, diabetes mellitus type 2, sacral decubitus and pressure injuries to bilateral heels, Chronic diastolic heart failure and anemia and admitted for sepsis likely due to UTI    PT Comments    Pt assisted to sitting and performed exercises as below.  Pt unable to sit upright without external assist. Continue to recommend SNF.  Follow Up Recommendations  SNF     Equipment Recommendations  None recommended by PT    Recommendations for Other Services       Precautions / Restrictions Precautions Precautions: Fall    Mobility  Bed Mobility Overal bed mobility: Needs Assistance Bed Mobility: Rolling;Supine to Sit;Sit to Supine Rolling: Total assist;+2 for physical assistance   Supine to sit: Total assist Sit to supine: Total assist   General bed mobility comments: pt requires total assist for rolling, encouraged pt to assist with upper body however pt unable to reach over for rail, requires total assist for repositioning, requiring total assist for upper and lower body for supine <-> sit  Transfers                    Ambulation/Gait                 Stairs            Wheelchair Mobility    Modified Rankin (Stroke Patients Only)       Balance Overall balance assessment: Needs assistance Sitting-balance support: No upper extremity supported Sitting balance-Leahy Scale: Zero Sitting balance - Comments: pt able to sit with increased trunk flexion as prop sitting without support however unable to remain in upright posture or challenges without significant external assist, pt reluctant to take weight through UEs despite attempts Postural control: Posterior lean                                  Cognition Arousal/Alertness: Awake/alert Behavior  During Therapy: WFL for tasks assessed/performed Overall Cognitive Status: Within Functional Limits for tasks assessed                                        Exercises Other Exercises Other Exercises: Pt performed SAQs (attempting LAQ but not strong enough to perform) sitting EOB with trunk support x10 each LE Other Exercises: Pt performed trunk flexion and extension in small range x10 without UE support Other Exercises: Pt performed reaching forward to midchest height bilaterally x5 Other Exercises: Pt performed shoulder flexion bilaterally to approx 90* x5    General Comments        Pertinent Vitals/Pain Pain Assessment: No/denies pain    Home Living                      Prior Function            PT Goals (current goals can now be found in the care plan section) Progress towards PT goals: Progressing toward goals    Frequency    Min 2X/week      PT Plan Current plan remains appropriate    Co-evaluation              AM-PAC PT "6 Clicks" Daily  Activity  Outcome Measure  Difficulty turning over in bed (including adjusting bedclothes, sheets and blankets)?: Total Difficulty moving from lying on back to sitting on the side of the bed? : Total Difficulty sitting down on and standing up from a chair with arms (e.g., wheelchair, bedside commode, etc,.)?: Total Help needed moving to and from a bed to chair (including a wheelchair)?: Total Help needed walking in hospital room?: Total Help needed climbing 3-5 steps with a railing? : Total 6 Click Score: 6    End of Session   Activity Tolerance: Patient tolerated treatment well Patient left: in bed;with call bell/phone within reach;with family/visitor present;with bed alarm set   PT Visit Diagnosis: Muscle weakness (generalized) (M62.81)     Time: 1610-9604 PT Time Calculation (min) (ACUTE ONLY): 15 min  Charges:  $Therapeutic Activity: 8-22 mins                    G Codes:        Zenovia Jarred, PT, DPT 07/02/2016 Pager: [phone]    Maida Sale E 07/02/2016, 2:49 PM

## 2016-07-03 DIAGNOSIS — L89150 Pressure ulcer of sacral region, unstageable: Secondary | ICD-10-CM | POA: Diagnosis not present

## 2016-07-03 DIAGNOSIS — D638 Anemia in other chronic diseases classified elsewhere: Secondary | ICD-10-CM | POA: Diagnosis present

## 2016-07-03 DIAGNOSIS — R634 Abnormal weight loss: Secondary | ICD-10-CM | POA: Diagnosis not present

## 2016-07-03 DIAGNOSIS — J45909 Unspecified asthma, uncomplicated: Secondary | ICD-10-CM | POA: Diagnosis present

## 2016-07-03 DIAGNOSIS — L89624 Pressure ulcer of left heel, stage 4: Secondary | ICD-10-CM | POA: Diagnosis not present

## 2016-07-03 DIAGNOSIS — N342 Other urethritis: Secondary | ICD-10-CM | POA: Diagnosis not present

## 2016-07-03 DIAGNOSIS — R000 Tachycardia, unspecified: Secondary | ICD-10-CM | POA: Diagnosis present

## 2016-07-03 DIAGNOSIS — N390 Urinary tract infection, site not specified: Secondary | ICD-10-CM | POA: Diagnosis not present

## 2016-07-03 DIAGNOSIS — A419 Sepsis, unspecified organism: Secondary | ICD-10-CM | POA: Diagnosis present

## 2016-07-03 DIAGNOSIS — E785 Hyperlipidemia, unspecified: Secondary | ICD-10-CM | POA: Diagnosis not present

## 2016-07-03 DIAGNOSIS — M189 Osteoarthritis of first carpometacarpal joint, unspecified: Secondary | ICD-10-CM | POA: Diagnosis not present

## 2016-07-03 DIAGNOSIS — R9431 Abnormal electrocardiogram [ECG] [EKG]: Secondary | ICD-10-CM | POA: Diagnosis not present

## 2016-07-03 DIAGNOSIS — M6281 Muscle weakness (generalized): Secondary | ICD-10-CM | POA: Diagnosis not present

## 2016-07-03 DIAGNOSIS — M8580 Other specified disorders of bone density and structure, unspecified site: Secondary | ICD-10-CM | POA: Diagnosis not present

## 2016-07-03 DIAGNOSIS — Z0000 Encounter for general adult medical examination without abnormal findings: Secondary | ICD-10-CM | POA: Diagnosis not present

## 2016-07-03 DIAGNOSIS — L89302 Pressure ulcer of unspecified buttock, stage 2: Secondary | ICD-10-CM | POA: Diagnosis not present

## 2016-07-03 DIAGNOSIS — S6990XA Unspecified injury of unspecified wrist, hand and finger(s), initial encounter: Secondary | ICD-10-CM | POA: Diagnosis not present

## 2016-07-03 DIAGNOSIS — R109 Unspecified abdominal pain: Secondary | ICD-10-CM | POA: Diagnosis not present

## 2016-07-03 DIAGNOSIS — R10819 Abdominal tenderness, unspecified site: Secondary | ICD-10-CM | POA: Diagnosis not present

## 2016-07-03 DIAGNOSIS — X58XXXA Exposure to other specified factors, initial encounter: Secondary | ICD-10-CM | POA: Diagnosis not present

## 2016-07-03 DIAGNOSIS — E119 Type 2 diabetes mellitus without complications: Secondary | ICD-10-CM | POA: Diagnosis not present

## 2016-07-03 DIAGNOSIS — G9341 Metabolic encephalopathy: Secondary | ICD-10-CM | POA: Diagnosis present

## 2016-07-03 DIAGNOSIS — Y999 Unspecified external cause status: Secondary | ICD-10-CM | POA: Diagnosis not present

## 2016-07-03 DIAGNOSIS — T502X5A Adverse effect of carbonic-anhydrase inhibitors, benzothiadiazides and other diuretics, initial encounter: Secondary | ICD-10-CM | POA: Diagnosis present

## 2016-07-03 DIAGNOSIS — Z6820 Body mass index (BMI) 20.0-20.9, adult: Secondary | ICD-10-CM | POA: Diagnosis not present

## 2016-07-03 DIAGNOSIS — M47812 Spondylosis without myelopathy or radiculopathy, cervical region: Secondary | ICD-10-CM | POA: Diagnosis not present

## 2016-07-03 DIAGNOSIS — R6520 Severe sepsis without septic shock: Secondary | ICD-10-CM | POA: Diagnosis not present

## 2016-07-03 DIAGNOSIS — R1311 Dysphagia, oral phase: Secondary | ICD-10-CM | POA: Diagnosis not present

## 2016-07-03 DIAGNOSIS — Y939 Activity, unspecified: Secondary | ICD-10-CM | POA: Diagnosis not present

## 2016-07-03 DIAGNOSIS — E1122 Type 2 diabetes mellitus with diabetic chronic kidney disease: Secondary | ICD-10-CM | POA: Diagnosis present

## 2016-07-03 DIAGNOSIS — S62014A Nondisplaced fracture of distal pole of navicular [scaphoid] bone of right wrist, initial encounter for closed fracture: Secondary | ICD-10-CM | POA: Diagnosis not present

## 2016-07-03 DIAGNOSIS — L89610 Pressure ulcer of right heel, unstageable: Secondary | ICD-10-CM | POA: Diagnosis not present

## 2016-07-03 DIAGNOSIS — K529 Noninfective gastroenteritis and colitis, unspecified: Secondary | ICD-10-CM | POA: Diagnosis present

## 2016-07-03 DIAGNOSIS — M19011 Primary osteoarthritis, right shoulder: Secondary | ICD-10-CM | POA: Diagnosis not present

## 2016-07-03 DIAGNOSIS — M25511 Pain in right shoulder: Secondary | ICD-10-CM | POA: Diagnosis not present

## 2016-07-03 DIAGNOSIS — N179 Acute kidney failure, unspecified: Secondary | ICD-10-CM | POA: Diagnosis present

## 2016-07-03 DIAGNOSIS — Z7982 Long term (current) use of aspirin: Secondary | ICD-10-CM | POA: Diagnosis not present

## 2016-07-03 DIAGNOSIS — R55 Syncope and collapse: Secondary | ICD-10-CM | POA: Diagnosis not present

## 2016-07-03 DIAGNOSIS — R627 Adult failure to thrive: Secondary | ICD-10-CM | POA: Diagnosis not present

## 2016-07-03 DIAGNOSIS — Y929 Unspecified place or not applicable: Secondary | ICD-10-CM | POA: Diagnosis not present

## 2016-07-03 DIAGNOSIS — Z8249 Family history of ischemic heart disease and other diseases of the circulatory system: Secondary | ICD-10-CM | POA: Diagnosis not present

## 2016-07-03 DIAGNOSIS — R402411 Glasgow coma scale score 13-15, in the field [EMT or ambulance]: Secondary | ICD-10-CM | POA: Diagnosis not present

## 2016-07-03 DIAGNOSIS — F419 Anxiety disorder, unspecified: Secondary | ICD-10-CM | POA: Diagnosis not present

## 2016-07-03 DIAGNOSIS — M150 Primary generalized (osteo)arthritis: Secondary | ICD-10-CM | POA: Diagnosis not present

## 2016-07-03 DIAGNOSIS — M19012 Primary osteoarthritis, left shoulder: Secondary | ICD-10-CM | POA: Diagnosis not present

## 2016-07-03 DIAGNOSIS — R531 Weakness: Secondary | ICD-10-CM | POA: Diagnosis not present

## 2016-07-03 DIAGNOSIS — K219 Gastro-esophageal reflux disease without esophagitis: Secondary | ICD-10-CM | POA: Diagnosis not present

## 2016-07-03 DIAGNOSIS — M25512 Pain in left shoulder: Secondary | ICD-10-CM | POA: Diagnosis not present

## 2016-07-03 DIAGNOSIS — L89614 Pressure ulcer of right heel, stage 4: Secondary | ICD-10-CM | POA: Diagnosis present

## 2016-07-03 DIAGNOSIS — S62101S Fracture of unspecified carpal bone, right wrist, sequela: Secondary | ICD-10-CM | POA: Diagnosis not present

## 2016-07-03 DIAGNOSIS — I5032 Chronic diastolic (congestive) heart failure: Secondary | ICD-10-CM | POA: Diagnosis present

## 2016-07-03 DIAGNOSIS — R238 Other skin changes: Secondary | ICD-10-CM | POA: Diagnosis not present

## 2016-07-03 DIAGNOSIS — M25531 Pain in right wrist: Secondary | ICD-10-CM | POA: Diagnosis not present

## 2016-07-03 DIAGNOSIS — L89153 Pressure ulcer of sacral region, stage 3: Secondary | ICD-10-CM | POA: Diagnosis not present

## 2016-07-03 DIAGNOSIS — S6991XA Unspecified injury of right wrist, hand and finger(s), initial encounter: Secondary | ICD-10-CM | POA: Diagnosis not present

## 2016-07-03 DIAGNOSIS — G8911 Acute pain due to trauma: Secondary | ICD-10-CM | POA: Diagnosis not present

## 2016-07-03 DIAGNOSIS — E118 Type 2 diabetes mellitus with unspecified complications: Secondary | ICD-10-CM | POA: Diagnosis not present

## 2016-07-03 DIAGNOSIS — R4189 Other symptoms and signs involving cognitive functions and awareness: Secondary | ICD-10-CM | POA: Diagnosis present

## 2016-07-03 DIAGNOSIS — E1169 Type 2 diabetes mellitus with other specified complication: Secondary | ICD-10-CM | POA: Diagnosis not present

## 2016-07-03 DIAGNOSIS — L89620 Pressure ulcer of left heel, unstageable: Secondary | ICD-10-CM | POA: Diagnosis not present

## 2016-07-03 DIAGNOSIS — N183 Chronic kidney disease, stage 3 (moderate): Secondary | ICD-10-CM | POA: Diagnosis not present

## 2016-07-03 DIAGNOSIS — Z9889 Other specified postprocedural states: Secondary | ICD-10-CM | POA: Diagnosis not present

## 2016-07-03 DIAGNOSIS — M79643 Pain in unspecified hand: Secondary | ICD-10-CM | POA: Diagnosis not present

## 2016-07-03 DIAGNOSIS — Z87891 Personal history of nicotine dependence: Secondary | ICD-10-CM | POA: Diagnosis not present

## 2016-07-03 DIAGNOSIS — E43 Unspecified severe protein-calorie malnutrition: Secondary | ICD-10-CM | POA: Diagnosis not present

## 2016-07-03 DIAGNOSIS — F411 Generalized anxiety disorder: Secondary | ICD-10-CM | POA: Diagnosis not present

## 2016-07-03 DIAGNOSIS — S62001A Unspecified fracture of navicular [scaphoid] bone of right wrist, initial encounter for closed fracture: Secondary | ICD-10-CM | POA: Diagnosis not present

## 2016-07-03 DIAGNOSIS — R4182 Altered mental status, unspecified: Secondary | ICD-10-CM | POA: Diagnosis not present

## 2016-07-03 DIAGNOSIS — R402421 Glasgow coma scale score 9-12, in the field [EMT or ambulance]: Secondary | ICD-10-CM | POA: Diagnosis not present

## 2016-07-03 DIAGNOSIS — G894 Chronic pain syndrome: Secondary | ICD-10-CM | POA: Diagnosis not present

## 2016-07-03 DIAGNOSIS — M4800 Spinal stenosis, site unspecified: Secondary | ICD-10-CM | POA: Diagnosis not present

## 2016-07-03 DIAGNOSIS — R609 Edema, unspecified: Secondary | ICD-10-CM | POA: Diagnosis not present

## 2016-07-03 DIAGNOSIS — L89154 Pressure ulcer of sacral region, stage 4: Secondary | ICD-10-CM | POA: Diagnosis present

## 2016-07-03 DIAGNOSIS — Z79899 Other long term (current) drug therapy: Secondary | ICD-10-CM | POA: Diagnosis not present

## 2016-07-03 DIAGNOSIS — E876 Hypokalemia: Secondary | ICD-10-CM | POA: Diagnosis present

## 2016-07-03 DIAGNOSIS — I509 Heart failure, unspecified: Secondary | ICD-10-CM | POA: Diagnosis not present

## 2016-07-03 DIAGNOSIS — M109 Gout, unspecified: Secondary | ICD-10-CM | POA: Diagnosis not present

## 2016-07-03 DIAGNOSIS — R451 Restlessness and agitation: Secondary | ICD-10-CM | POA: Diagnosis not present

## 2016-07-03 DIAGNOSIS — I130 Hypertensive heart and chronic kidney disease with heart failure and stage 1 through stage 4 chronic kidney disease, or unspecified chronic kidney disease: Secondary | ICD-10-CM | POA: Diagnosis present

## 2016-07-03 DIAGNOSIS — M1990 Unspecified osteoarthritis, unspecified site: Secondary | ICD-10-CM | POA: Diagnosis present

## 2016-07-03 DIAGNOSIS — Z888 Allergy status to other drugs, medicaments and biological substances status: Secondary | ICD-10-CM | POA: Diagnosis not present

## 2016-07-03 DIAGNOSIS — M7989 Other specified soft tissue disorders: Secondary | ICD-10-CM | POA: Diagnosis not present

## 2016-07-03 LAB — GLUCOSE, CAPILLARY
Glucose-Capillary: 149 mg/dL — ABNORMAL HIGH (ref 65–99)
Glucose-Capillary: 170 mg/dL — ABNORMAL HIGH (ref 65–99)

## 2016-07-03 MED ORDER — SULFAMETHOXAZOLE-TRIMETHOPRIM 800-160 MG PO TABS: 1.0000 | ORAL_TABLET | Freq: Two times a day (BID) | ORAL | 0 refills | Status: DC

## 2016-07-03 MED ORDER — CLONAZEPAM 0.5 MG PO TABS: ORAL_TABLET | ORAL | 0 refills | Status: DC

## 2016-07-03 NOTE — Progress Notes (Signed)
[facility] No questions or concerns at this time. Patient transferred via [facility].

## 2016-07-03 NOTE — Progress Notes (Signed)
Date: Jul 03, 2016 Discharge orders review for case management needs.  None found Bridney Guadarrama, BSN, RN3, CCM: [phone] 

## 2016-07-03 NOTE — Discharge Summary (Addendum)
Physician Discharge Summary  Ileana RoupVerna Andis [id number] DOB: December 05, 1923 DOA: 06/29/2016  PCP: Sandford Craze'Sullivan, Melissa, NP  Admit date: 06/29/2016 Discharge date: 07/03/2016  Admitted From: home Disposition:  SNF  Recommendations for Outpatient Follow-up:  1. Follow up with PCP in 1-2 weeks 2. Continue with hydrotherapy and reevaluate daily for further needs. 3. Check basic metabolic panel in one week [facility] skilled nursing facility.  Home Health:no Equipment/Devices:none  Discharge Condition:stable CODE STATUS: Limited code Diet recommendation: Heart Healthy   Brief/Interim Summary: 83 y.o. female Past medical history of sarcoid, diabetes mellitus type 2, cyclic decubitus ulcer and heel, Chronic diastolic heart failure and anemia is brought to the ED after patient was having generalized body aches for several days, in the ED she was found to be hypotensive and tachycardic with a normal lactic acid so sepsis pathway was started.  Discharge Diagnoses:  Principal Problem:   Sepsis (HCC) Active Problems:   Chronic kidney disease, stage III (moderate)   Pressure ulcer of unspecified buttock, stage 2   DM2 (diabetes mellitus, type 2) (HCC)   Infective urethritis   Protein-calorie malnutrition, severe  Sepsis likely due to UTI: She was admitted to the hospital start on IV fluids and IV pressors stress dose steroids and empiric antibiotics after 24 hours is resolved along with her lactic acidosis. She became afebrile her Zosyn was DC'd and changed to Bactrim she was then monitored for 24 hours and she remained afebrile with ongoing improvement.  Acute encephalopathy likely due to UTI: No resolved.  Chronic disease stage III: Creatinine range stable.  Indwelling Foley catheter:  Chronic diastolic heart failure: Recent diuretics at current home dose.  Failure to thrive: She's been bedbound since 2008.  Stage IV pressure ulcer of the buttock: Wound care was consulted who  recommended hydrotherapy. She will continue this is skilled nursing facility.  Diabetes mellitus type 2: Last A1c was 6.6 metformin was held on admission. She was started on sliding scale insulin. She will resume her metformin as an outpatient.   Discharge Instructions  Discharge Instructions    Diet - low sodium heart healthy    Complete by:  As directed    Increase activity slowly    Complete by:  As directed      Allergies as of 07/03/2016      Reactions   Dicyclomine Other (See Comments)   Reaction:  Dizziness    Gabapentin Other (See Comments)   Reaction:  Dizziness    Metformin And Related Nausea And Vomiting   Lyrica [pregabalin] Other (See Comments)   Reaction:  Unknown   Topiramate Other (See Comments)   Reaction:  Unknown      Medication List    STOP taking these medications   HYDROcodone-acetaminophen 5-325 MG tablet Commonly known as:  NORCO/VICODIN     TAKE these medications   acidophilus Caps capsule Take 1 capsule by mouth daily.   albuterol 108 (90 Base) MCG/ACT inhaler Commonly known as:  PROVENTIL HFA;VENTOLIN HFA Inhale 1-2 puffs into the lungs every 6 (six) hours as needed for wheezing or shortness of breath.   albuterol (2.5 MG/3ML) 0.083% nebulizer solution Commonly known as:  PROVENTIL Take 2.5 mg by nebulization every 6 (six) hours as needed for wheezing or shortness of breath.   aspirin EC 81 MG tablet Take 81 mg by mouth daily.   bisacodyl 10 MG suppository Commonly known as:  DULCOLAX Place 1 suppository (10 mg total) rectally daily as needed for moderate constipation.   cholecalciferol 1000 units  tablet Commonly known as:  VITAMIN D Take 1,000 Units by mouth daily.   clonazePAM 0.5 MG tablet Commonly known as:  KLONOPIN TAKE 1/2 TABLET BY MOUTH DAILY IN THE MORNING, 1 TABLET AT 2PM, MAY TAKE 1/2 TABLET BETWEEN MORNING AND 2PM, THEN DELAY 2PM DOSE TO 7PM   collagenase ointment Commonly known as:  SANTYL Apply 1 application  topically daily.   diclofenac sodium 1 % Gel Commonly known as:  VOLTAREN APPLY 2 GRAMS EXTERNALLY TO THE AFFECTED AREA FOUR TIMES DAILY AS NEEDED FOR PAIN   feeding supplement (ENSURE ENLIVE) Liqd Take 237 mLs by mouth 3 (three) times daily between meals.   megestrol 20 MG tablet Commonly known as:  MEGACE Take 1 tablet (20 mg total) by mouth daily.   metFORMIN 500 MG tablet Commonly known as:  GLUCOPHAGE Take 500 mg by mouth daily with breakfast.   multivitamin with minerals Tabs tablet Take 1 tablet by mouth daily.   omeprazole 40 MG capsule Commonly known as:  PRILOSEC Take 40 mg by mouth daily.   simvastatin 40 MG tablet Commonly known as:  ZOCOR Take 40 mg by mouth at bedtime.   sulfamethoxazole-trimethoprim 800-160 MG tablet Commonly known as:  BACTRIM DS,SEPTRA DS Take 1 tablet by mouth every 12 (twelve) hours.   torsemide 5 MG tablet Commonly known as:  DEMADEX Take 5 mg by mouth daily.       Allergies  Allergen Reactions  . Dicyclomine Other (See Comments)    Reaction:  Dizziness   . Gabapentin Other (See Comments)    Reaction:  Dizziness   . Metformin And Related Nausea And Vomiting  . Lyrica [Pregabalin] Other (See Comments)    Reaction:  Unknown  . Topiramate Other (See Comments)    Reaction:  Unknown    Consultations:  None   Procedures/Studies: Dg Chest 1 View  Result Date: 06/29/2016 CLINICAL DATA:  Chest pain EXAM: CHEST 1 VIEW COMPARISON:  05/15/2016 FINDINGS: Cardiac shadow is stable. Biapical scarring is noted and stable from a prior exam. No new focal infiltrate is seen. No acute bony abnormality is noted. IMPRESSION: Chronic scarring in the apices bilaterally. Electronically Signed   By: Alcide Clever M.D.   On: 06/29/2016 20:12   Dg Shoulder Right  Result Date: 06/26/2016 CLINICAL DATA:  Chronic right shoulder pain, decreased range of motion EXAM: RIGHT SHOULDER - 2+ VIEW COMPARISON:  05/15/2016 chest x-ray FINDINGS: Advanced right  shoulder degenerative arthropathy with joint space loss, sclerosis and humeral head bony spurring. Mild AC joint degenerative changes well. Bones are osteopenic. No gross malalignment or fracture. Right upper lobe chronic reticulonodular opacity compatible with history of sarcoid. IMPRESSION: Right shoulder arthropathy without acute process Osteopenia Electronically Signed   By: Judie Petit.  Shick M.D.   On: 06/26/2016 12:25   Dg Abdomen 1 View  Result Date: 06/29/2016 CLINICAL DATA:  Acute onset of generalized abdominal pain and pelvic pain. Initial encounter. EXAM: ABDOMEN - 1 VIEW COMPARISON:  CT of the abdomen and pelvis performed 04/03/2016 FINDINGS: The visualized bowel gas pattern is unremarkable. Scattered air and stool filled loops of colon are seen; no abnormal dilatation of small bowel loops is seen to suggest small bowel obstruction. No free intra-abdominal air is identified, though evaluation for free air is limited on a single supine view. Degenerative change is noted about the pubic symphysis; the sacroiliac joints are unremarkable in appearance. IMPRESSION: Unremarkable bowel gas pattern; no free intra-abdominal air seen. Moderate to large amount of stool noted in  the colon. Electronically Signed   By: Roanna Raider M.D.   On: 06/29/2016 20:24       Subjective: No complains feels great  Discharge Exam: Vitals:   07/03/16 0105 07/03/16 0642  BP: (!) 140/91 (!) 163/89  Pulse: (!) 107 (!) 107  Resp:  18  Temp: 99.2 F (37.3 C) 99.1 F (37.3 C)   Vitals:   07/01/16 2107 07/02/16 2147 07/03/16 0105 07/03/16 0642  BP: (!) 146/80 (!) 138/92 (!) 140/91 (!) 163/89  Pulse: 98 (!) 107 (!) 107 (!) 107  Resp:    18  Temp: 98.4 F (36.9 C) 99.3 F (37.4 C) 99.2 F (37.3 C) 99.1 F (37.3 C)  TempSrc: Oral Oral Oral Oral  SpO2: 99% 94% 98% 100%  Weight:      Height:        General: Patient is awake alert and oriented 3 Cardiovascular: Regular rate and rhythm with positive S1 and  S2 Respiratory: Good air movement and clear to auscultation Abdominal: Bowel sounds soft nontender, nondistended Extremities: No edema    The results of significant diagnostics from this hospitalization (including imaging, microbiology, ancillary and laboratory) are listed below for reference.     Microbiology: Recent Results (from the past 240 hour(s))  Urine culture     Status: Abnormal   Collection Time: 06/29/16  7:47 PM  Result Value Ref Range Status   Specimen Description URINE, RANDOM  Final   Special Requests NONE  Final   Culture MULTIPLE SPECIES PRESENT, SUGGEST RECOLLECTION (A)  Final   Report Status 07/01/2016 FINAL  Final  Blood culture (routine x 2)     Status: None (Preliminary result)   Collection Time: 06/29/16  8:50 PM  Result Value Ref Range Status   Specimen Description BLOOD BLOOD RIGHT FOREARM  Final   Special Requests IN PEDIATRIC BOTTLE Blood Culture adequate volume  Final   Culture   Final    NO GROWTH 3 DAYS Performed at [facility], [address], [place], [place] [address]    Report Status PENDING  Incomplete  Blood culture (routine x 2)     Status: None (Preliminary result)   Collection Time: 06/30/16  1:01 AM  Result Value Ref Range Status   Specimen Description BLOOD LEFT HAND  Final   Special Requests IN PEDIATRIC BOTTLE Blood Culture adequate volume  Final   Culture   Final    NO GROWTH 2 DAYS Performed at [facility], [address], [place], [place] [address]    Report Status PENDING  Incomplete  MRSA PCR Screening     Status: None   Collection Time: 06/30/16  1:36 AM  Result Value Ref Range Status   MRSA by PCR NEGATIVE NEGATIVE Final    Comment:        The GeneXpert MRSA Assay (FDA approved for NASAL specimens only), is one component of a comprehensive MRSA colonization surveillance program. It is not intended to diagnose MRSA infection nor to guide or monitor treatment for MRSA infections.      Labs: BNP  (last 3 results)  Recent Labs  12/19/15 1100 03/14/16 1716  BNP 155.0* 197.2*   Basic Metabolic Panel:  Recent Labs Lab 06/29/16 1908 06/30/16 0305 07/01/16 0330  NA 139 142 139  K 3.9 3.7 3.7  CL 106 112* 113*  CO2 22 18* 19*  GLUCOSE 112* 104* 204*  BUN 21* 22* 24*  CREATININE 0.79 0.83 0.83  CALCIUM 9.4 8.7* 8.2*  MG  --  1.6* 2.3   Liver Function Tests:  Recent Labs Lab 06/29/16 1908 06/30/16 0305  AST 16 19  ALT 7* 7*  ALKPHOS 57 55  BILITOT 0.5 1.0  PROT 6.4* 5.9*  ALBUMIN 3.0* 2.9*   No results for input(s): LIPASE, AMYLASE in the last 168 hours. No results for input(s): AMMONIA in the last 168 hours. CBC:  Recent Labs Lab 06/29/16 1908 06/30/16 0305 07/01/16 0330  WBC 7.0 13.6* 10.7*  NEUTROABS 4.0  --  9.9*  HGB 10.3* 10.3* 9.2*  HCT 31.7* 32.7* 27.4*  MCV 89.0 91.6 89.5  PLT 272 218 223   Cardiac Enzymes:  Recent Labs Lab 06/30/16 0101 06/30/16 0516  TROPONINI <0.03 <0.03   BNP: Invalid input(s): POCBNP CBG:  Recent Labs Lab 07/02/16 0749 07/02/16 1153 07/02/16 1618 07/03/16 0757 07/03/16 1131  GLUCAP 227* 195* 215* 149* 170*   D-Dimer No results for input(s): DDIMER in the last 72 hours. Hgb A1c No results for input(s): HGBA1C in the last 72 hours. Lipid Profile No results for input(s): CHOL, HDL, LDLCALC, TRIG, CHOLHDL, LDLDIRECT in the last 72 hours. Thyroid function studies No results for input(s): TSH, T4TOTAL, T3FREE, THYROIDAB in the last 72 hours.  Invalid input(s): FREET3 Anemia work up No results for input(s): VITAMINB12, FOLATE, FERRITIN, TIBC, IRON, RETICCTPCT in the last 72 hours. Urinalysis    Component Value Date/Time   COLORURINE AMBER (A) 06/29/2016 1947   APPEARANCEUR CLOUDY (A) 06/29/2016 1947   LABSPEC 1.036 (H) 06/29/2016 1947   PHURINE 5.0 06/29/2016 1947   GLUCOSEU NEGATIVE 06/29/2016 1947   HGBUR SMALL (A) 06/29/2016 1947   BILIRUBINUR SMALL (A) 06/29/2016 1947   KETONESUR 20 (A)  06/29/2016 1947   PROTEINUR 30 (A) 06/29/2016 1947   UROBILINOGEN 0.2 10/18/2014 2310   NITRITE NEGATIVE 06/29/2016 1947   LEUKOCYTESUR LARGE (A) 06/29/2016 1947   Sepsis Labs Invalid input(s): PROCALCITONIN,  WBC,  LACTICIDVEN Microbiology Recent Results (from the past 240 hour(s))  Urine culture     Status: Abnormal   Collection Time: 06/29/16  7:47 PM  Result Value Ref Range Status   Specimen Description URINE, RANDOM  Final   Special Requests NONE  Final   Culture MULTIPLE SPECIES PRESENT, SUGGEST RECOLLECTION (A)  Final   Report Status 07/01/2016 FINAL  Final  Blood culture (routine x 2)     Status: None (Preliminary result)   Collection Time: 06/29/16  8:50 PM  Result Value Ref Range Status   Specimen Description BLOOD BLOOD RIGHT FOREARM  Final   Special Requests IN PEDIATRIC BOTTLE Blood Culture adequate volume  Final   Culture   Final    NO GROWTH 3 DAYS Performed at [facility], [address] [place], [place] [address]    Report Status PENDING  Incomplete  Blood culture (routine x 2)     Status: None (Preliminary result)   Collection Time: 06/30/16  1:01 AM  Result Value Ref Range Status   Specimen Description BLOOD LEFT HAND  Final   Special Requests IN PEDIATRIC BOTTLE Blood Culture adequate volume  Final   Culture   Final    NO GROWTH 2 DAYS Performed at [facility], [address] [place], [place] [address]    Report Status PENDING  Incomplete  MRSA PCR Screening     Status: None   Collection Time: 06/30/16  1:36 AM  Result Value Ref Range Status   MRSA by PCR NEGATIVE NEGATIVE Final  Comment:        The GeneXpert MRSA Assay (FDA approved for NASAL specimens only), is one component of a comprehensive MRSA colonization surveillance program. It is not intended to diagnose MRSA infection nor to guide or monitor treatment for MRSA infections.      Time coordinating discharge: Over 30 minutes  SIGNED:   Marinda Elk,  MD  [facility] 07/03/2016, 12:48 PM Pager   If 7PM-7AM, please contact night-coverage [email or web address] Password [email or web address]

## 2016-07-03 NOTE — Clinical Social Work Placement (Signed)
   CLINICAL SOCIAL WORK PLACEMENT  NOTE  Date:  07/03/2016  Patient Details  Name: Elizabeth Keller MRN: [id number] Date of Birth: 1923/07/30  Clinical Social Work is seeking post-discharge placement for this patient at the Skilled  Nursing Facility level of care (*CSW will initial, date and re-position this form in  chart as items are completed):  Yes   Patient/family provided with [facility] Clinical Social Work Department's list of facilities offering this level of care within the geographic area requested by the patient (or if unable, by the patient's family).  Yes   Patient/family informed of their freedom to choose among providers that offer the needed level of care, that participate in Medicare, Medicaid or managed care program needed by the patient, have an available bed and are willing to accept the patient.  Yes   Patient/family informed of [facility]'s ownership interest in [facility] and [facility], as well as of the fact that they are under no obligation to receive care at these facilities.  PASRR submitted to EDS on 07/01/16     PASRR number received on 07/01/16     Existing PASRR number confirmed on       FL2 transmitted to all facilities in geographic area requested by pt/family on 07/01/16     FL2 transmitted to all facilities within larger geographic area on       Patient informed that his/her managed care company has contracts with or will negotiate with certain facilities, including the following:        Yes   Patient/family informed of bed offers received.  [facility]     Physician recommends and patient chooses bed at      [facility] on 07/03/16.  Patient to be transferred to facility by [facility]     Patient family notified on 07/03/16 of transfer.  Name of family member notified:  Son     PHYSICIAN       Additional Comment: Pt / family are in agreement  with plan for [facility]. [facility] transport is needed. Medical necessity form completed. Son is aware that out of pocket costs may be associated with [facility] transport. D/C Summary sent to SNF for review. No scripts printed. # for report provided to nsg.   _______________________________________________ Royetta AsalHaidinger, Sederick Jacobsen Lee, LCSW  [phone] 07/03/2016, 2:25 PM

## 2016-07-03 NOTE — Progress Notes (Signed)
Physical Therapy Wound Treatment Patient Details  Name: Elizabeth Keller MRN: [id number] Date of Birth: [birth year]  Today's Date: 07/03/2016 Time: 0258-5277 Time Calculation (min): 28 min  Subjective  Subjective: Pt appears more comfortable during session today. Patient and Family Stated Goals: Family states pt wishes to go to rehab and be able to walk again. Prior Treatments: none known to heels  Pain Score:  Only reported pain with positioning,  Repositioned end of session.   Prevalon boots are not floating heels adequately so discussed with son to have nursing staff place pillows under lower legs since pt is to d/c to SNF today.  Pt would likely best benefit from [facility] for positioning as well as for future mobility transfers.    Wound Assessment                                                                                                                                                                   Pressure Injury 07/01/16 Stage III -  Full thickness tissue loss. Subcutaneous fat may be visible but bone, tendon or muscle are NOT exposed. PT hydrotherapy (Active)  Dressing Type Gauze (Comment);Moist to dry;Foam 07/03/2016  2:00 PM  Dressing Changed 07/03/2016  2:00 PM  Dressing Change Frequency PRN 07/03/2016  2:00 PM  State of Healing Closed wound edges 07/03/2016  2:00 PM  Site / Wound Assessment Yellow 07/03/2016  2:00 PM  % Wound base Red or Granulating 20% 07/03/2016  2:00 PM  % Wound base Yellow/Fibrinous Exudate 80% 07/03/2016  2:00 PM  % Wound base Black/Eschar 15% 07/02/2016  2:30 PM  Wound Length (cm) 3.5 cm 07/01/2016  3:00 PM  Wound Width (cm) 5.5 cm 07/01/2016  3:00 PM  Margins Unattached edges (unapproximated) 07/03/2016  2:00 PM  Drainage Amount Moderate 07/03/2016  2:00 PM  Drainage Description Serosanguineous 07/03/2016  2:00 PM  Treatment Debridement (Selective);Hydrotherapy (Pulse lavage) 07/03/2016  2:00 PM    Hydrotherapy Pulsed lavage therapy - wound location: Right heel Pulsed Lavage with Suction (psi): 8 psi Pulsed Lavage with Suction - Normal Saline Used: 1000 mL Pulsed Lavage Tip: Tip with splash shield Selective Debridement Selective Debridement - Location: Right heel Selective Debridement - Tools Used: Scissors;Forceps Selective Debridement - Tissue Removed: slough   Wound Assessment and Plan  Wound Therapy - Assess/Plan/Recommendations Wound Therapy - Clinical Statement: Pt presents with right heel stage III pressure injury which would benefit from hydrotherapy in order to reduce bioburden and promote wound healing.  Left heel also with unstageable pressure injury however this is covered in black eschar.  Do not feel L heel would benefit from hydrotherapy at this time as injury is stable and covered with eschar.  If L heel opens or begins to drain, will reassess need for hydrotherapy at  that time. Wound Therapy - Functional Problem List: Pt has been bedbound since February, also with sacral pressure injury and sDTI to right trochanter. Factors Delaying/Impairing Wound Healing: Diabetes Mellitus;Immobility Hydrotherapy Plan: Debridement;Dressing change;Patient/family education;Pulsatile lavage with suction Wound Therapy - Frequency: 6X / week Wound Therapy - Follow Up Recommendations: Skilled nursing facility Wound Plan: Plan to perform hydrotherapy to right heel to promote wound heel and remove nonviable tissue.   5/24 able to remove final piece of eschar from wound bed and beginning to have more depth in inferior medial area; wound bed now mostly slough; 5/25 removed a little more slough however wound bed remains mostly covered with slough, some bleeding occurring (L heel still remains covered with hard black eschar with no drainage)  Wound Therapy Goals- Improve the function of patient's integumentary system by progressing the wound(s) through the phases of wound healing (inflammation -  proliferation - remodeling) by: Decrease Necrotic Tissue to: 80 Decrease Necrotic Tissue - Progress: Progressing toward goal Increase Granulation Tissue to: 20 Increase Granulation Tissue - Progress: Progressing toward goal Improve Drainage Characteristics: Min Improve Drainage Characteristics - Progress: Progressing toward goal Goals/treatment plan/discharge plan were made with and agreed upon by patient/family: Yes Time For Goal Achievement: 2 weeks Wound Therapy - Potential for Goals: Fair  Goals will be updated until maximal potential achieved or discharge criteria met.  Discharge criteria: when goals achieved, discharge from hospital, MD decision/surgical intervention, no progress towards goals, refusal/missing three consecutive treatments without notification or medical reason.  GP     Elizabeth Keller,Elizabeth Keller 07/03/2016, 3:08 PM Elizabeth Keller, PT, DPT 07/03/2016 Pager: [phone]

## 2016-07-04 LAB — CULTURE, BLOOD (ROUTINE X 2)
Culture: NO GROWTH
Special Requests: ADEQUATE

## 2016-07-05 LAB — CULTURE, BLOOD (ROUTINE X 2)
CULTURE: NO GROWTH
SPECIAL REQUESTS: ADEQUATE

## 2016-07-07 ENCOUNTER — Ambulatory Visit: Payer: Medicare Other | Admitting: Family

## 2016-07-07 DIAGNOSIS — L89620 Pressure ulcer of left heel, unstageable: Secondary | ICD-10-CM | POA: Diagnosis not present

## 2016-07-07 DIAGNOSIS — L89610 Pressure ulcer of right heel, unstageable: Secondary | ICD-10-CM | POA: Diagnosis not present

## 2016-07-08 ENCOUNTER — Non-Acute Institutional Stay (SKILLED_NURSING_FACILITY): Payer: Medicare Other | Admitting: Adult Health

## 2016-07-08 ENCOUNTER — Encounter: Payer: Self-pay | Admitting: Adult Health

## 2016-07-08 DIAGNOSIS — E1122 Type 2 diabetes mellitus with diabetic chronic kidney disease: Secondary | ICD-10-CM

## 2016-07-08 DIAGNOSIS — R627 Adult failure to thrive: Secondary | ICD-10-CM | POA: Diagnosis not present

## 2016-07-08 DIAGNOSIS — E785 Hyperlipidemia, unspecified: Secondary | ICD-10-CM

## 2016-07-08 DIAGNOSIS — L89154 Pressure ulcer of sacral region, stage 4: Secondary | ICD-10-CM

## 2016-07-08 DIAGNOSIS — R634 Abnormal weight loss: Secondary | ICD-10-CM | POA: Diagnosis not present

## 2016-07-08 DIAGNOSIS — L89620 Pressure ulcer of left heel, unstageable: Secondary | ICD-10-CM | POA: Diagnosis not present

## 2016-07-08 DIAGNOSIS — E1169 Type 2 diabetes mellitus with other specified complication: Secondary | ICD-10-CM

## 2016-07-08 DIAGNOSIS — L89610 Pressure ulcer of right heel, unstageable: Secondary | ICD-10-CM

## 2016-07-08 DIAGNOSIS — F419 Anxiety disorder, unspecified: Secondary | ICD-10-CM | POA: Diagnosis not present

## 2016-07-08 DIAGNOSIS — N183 Chronic kidney disease, stage 3 unspecified: Secondary | ICD-10-CM

## 2016-07-08 NOTE — Progress Notes (Signed)
Location:   [facility] Room Number: 113B Place of Service:  SNF (31)   CODE STATUS: dnr  Allergies  Allergen Reactions  . Dicyclomine Other (See Comments)    Reaction:  Dizziness   . Gabapentin Other (See Comments)    Reaction:  Dizziness   . Metformin And Related Nausea And Vomiting  . Lyrica [Pregabalin] Other (See Comments)    Reaction:  Unknown  . Topiramate Other (See Comments)    Reaction:  Unknown    Chief Complaint  Patient presents with  . Hospitalization Follow-up    following hospitalization 06/29/16 to 07/03/16 Sepsis    HPI:  She has been hospitalized for sepsis likely due to uti; the culture grew out multiple bacteria. She has a stage IV sacral ulceration. She is here for rehab at this time. More than likely this does represent a long term placement. She is unable to fully participate in the hpi or ros. Her po intake is poor she is on megace which has not been effective for weight gain.    Past Medical History:  Diagnosis Date  . Anxiety disorder   . Arthritis   . Asthma   . Chronic kidney disease   . Degenerative disk disease   . Diabetes mellitus   . GERD (gastroesophageal reflux disease)   . Hypercholesteremia   . Hypertension   . IBS (irritable bowel syndrome)   . Sarcoidosis   . Sepsis (HCC) 06/29/2016  . Spinal stenosis     Past Surgical History:  Procedure Laterality Date  . EYE SURGERY  2017   cataract / glaucoma    Social History   Social History  . Marital status: Divorced    Spouse name: N/A  . Number of children: N/A  . Years of education: N/A   Occupational History  . Not on file.   Social History Main Topics  . Smoking status: Former Games developermoker  . Smokeless tobacco: Never Used  . Alcohol use No  . Drug use: No  . Sexual activity: Not on file   Other Topics Concern  . Not on file   Social History Narrative  . No narrative on file   Family History  Problem Relation Age of Onset  . Hypertension Other         VITAL SIGNS BP 120/70   Pulse 100   Temp 98.4 F (36.9 C)   Resp (!) 24   Ht 5\' 4"  (1.626 m)   Wt 124 lb (56.2 kg)   SpO2 94%   BMI 21.28 kg/m   Patient's Medications  New Prescriptions   No medications on file  Previous Medications   ACETAMINOPHEN (TYLENOL) 500 MG TABLET    Take 500 mg by mouth. Take 2 tablets every 8 hours as needed for pain   ACIDOPHILUS (RISAQUAD) CAPS CAPSULE    Take 1 capsule by mouth daily.   ALBUTEROL (PROVENTIL HFA;VENTOLIN HFA) 108 (90 BASE) MCG/ACT INHALER    Inhale 1-2 puffs into the lungs every 6 (six) hours as needed for wheezing or shortness of breath.    ALBUTEROL (PROVENTIL) (2.5 MG/3ML) 0.083% NEBULIZER SOLUTION    Take 2.5 mg by nebulization every 6 (six) hours as needed for wheezing or shortness of breath.   ASPIRIN EC 81 MG TABLET    Take 81 mg by mouth daily.   BISACODYL (DULCOLAX) 10 MG SUPPOSITORY    Place 1 suppository (10 mg total) rectally daily as needed for moderate constipation.   CHOLECALCIFEROL (  VITAMIN D) 1000 UNITS TABLET    Take 1,000 Units by mouth daily.   CLONAZEPAM (KLONOPIN) 0.5 MG TABLET    TAKE 1/2 TABLET BY MOUTH DAILY IN THE MORNING, 1 TABLET AT 2PM, MAY TAKE 1/2 TABLET BETWEEN MORNING AND 2PM, THEN DELAY 2PM DOSE TO 7PM   COLLAGENASE (SANTYL) OINTMENT    Apply 1 application topically daily.    DICLOFENAC SODIUM (VOLTAREN) 1 % GEL    APPLY 2 GRAMS EXTERNALLY TO THE AFFECTED AREA FOUR TIMES DAILY AS NEEDED FOR PAIN   MEGESTROL (MEGACE) 20 MG TABLET    Take 1 tablet (20 mg total) by mouth daily.   MENTHOL, TOPICAL ANALGESIC, (BIOFREEZE) 4 % GEL    Apply topically. Apply to lower back topically every shift for back pain   METFORMIN (GLUCOPHAGE) 500 MG TABLET    Take 500 mg by mouth daily with breakfast.    MULTIPLE VITAMIN (MULTIVITAMIN WITH MINERALS) TABS TABLET    Take 1 tablet by mouth daily.   OMEPRAZOLE (PRILOSEC) 40 MG CAPSULE    Take 40 mg by mouth daily.    SIMVASTATIN (ZOCOR) 40 MG TABLET    Take 40 mg by  mouth at bedtime.    SULFAMETHOXAZOLE-TRIMETHOPRIM (BACTRIM DS,SEPTRA DS) 800-160 MG TABLET    Take 1 tablet by mouth every 12 (twelve) hours.   TORSEMIDE (DEMADEX) 5 MG TABLET    Take 5 mg by mouth daily.  Modified Medications   No medications on file  Discontinued Medications   FEEDING SUPPLEMENT, ENSURE ENLIVE, (ENSURE ENLIVE) LIQD    Take 237 mLs by mouth 3 (three) times daily between meals.     SIGNIFICANT DIAGNOSTIC EXAMS  06-29-16:right shoulder x-ray: Right shoulder arthropathy without acute process Osteopenia  06-29-16: chest x-ray: Chronic scarring in the apices bilaterally.  06-29-16: kub: Unremarkable bowel gas pattern; no free intra-abdominal air seen. Moderate to large amount of stool noted in the colon.   LABS REVIEWED:   05-16-16: hgb a1c 6.6 06-29-16: wbc 7.0; hgb 10.3; hct 31.7; mcv 89.0; plt 272; glucose 112; bun 21; creat 0.79; k+ 3.9; na++ 139; ca 9.4; liver normal albumin 3.0 blood culture: no growth; urine culture: multiple bacteria 06-30-16: wbc 13.6; hgb 10.3; hct 32.7; mcv 91.6; plt 218; glucose 104; bun 22; creat 0.83; k+ 3.7; na++ 142;ca 8.7; liver normal albumin 2.7; mag 1.6 07-01-16: wbc 10.7; hgb 9.2; hct 27.4; mcv 89.5; plt 223; glucose 204; bun 24; creat 0.83; k+ 3.7; na++ 139; mag 2.3    Review of Systems  Unable to perform ROS: Other (is sleepy )    Physical Exam  Constitutional: No distress.  Frail   Eyes: Conjunctivae are normal.  Neck: Neck supple. No JVD present. No thyromegaly present.  Cardiovascular: Normal rate, regular rhythm and intact distal pulses.   Murmur heard. Respiratory: Effort normal and breath sounds normal. No respiratory distress. She has no wheezes.  GI: Soft. Bowel sounds are normal. She exhibits no distension. There is no tenderness.  Genitourinary:  Genitourinary Comments: Foley   Musculoskeletal: She exhibits no edema.  Able to move all extremities   Lymphadenopathy:    She has no cervical adenopathy.  Neurological:  She is alert.  Skin: Skin is warm and dry. She is not diaphoretic.  Stage IV sacrum: 6 x 5 x 1.4 cm pale pink wound bed Left heel: unstaged: 2 x 4 cm fluctuant eschar Right heel unstaged: 3 x 3 cm 100% adherent slough   Psychiatric: She has a normal mood and affect.  ASSESSMENT/ PLAN:  1. Hypertension: b/p120/70  is currently stable will continue asa 81 mg daily and will monitor   2. CKD stage III: bun 24/creat 0.83  3. [place]: hgb 9.2; will monitor   4. DJD has spinal stenosis: no indications of pain present; has voltaren gel four times daily as needed  5. Diabetes: hgb a1c 6.6; will stop metformin at this time; will check cbg three times weekly and will monitor  6. Dyslipidemia: will stop zocor at this time; she has a poor appetite;   7. CHF: EF 55-60% (03-17-16): will continue her demadex 5 mg daily will monitor   8. FTT/ weight loss: her weight on 06-03-16 was 135; pounds: her current weight is 124 pounds. Her albumin is 2.7 will continue supplements per facility protocol  Will stop megace as this medication is not effective and due to the side effect profile. Will begin remeron 7.5 mg nightly for 30 days and will weigh her weekly   9. Anxiety: will continue klonopin 0.25 mg in the AM and 0.5 mg in the afternoon; has 0.25 mg daily as needed  10. Stage IV sacral wound and bilateral unstaged heel ulcerations: will continue current plan of care wound Dr.to see.   11. Asthma: has history of sarcoidosis: will continue albuterol neb every 6 hours as needed or albuterol 1 or 2 puffs every 6 hours as needed.   Will check cbc cmp   Time spent with patient  50   minutes >50% time spent counseling; reviewing medical record; tests; labs; and developing future plan of care   MD is aware of resident's narcotic use and is in agreement with current plan of care. We will attempt to wean resident as apropriate   Synthia Innocent NP [facility]  Contact [phone] Monday through  Friday 8am- 5pm  After hours call [phone]

## 2016-07-09 ENCOUNTER — Non-Acute Institutional Stay (SKILLED_NURSING_FACILITY): Payer: Medicare Other | Admitting: Internal Medicine

## 2016-07-09 ENCOUNTER — Encounter: Payer: Self-pay | Admitting: Internal Medicine

## 2016-07-09 DIAGNOSIS — L89154 Pressure ulcer of sacral region, stage 4: Secondary | ICD-10-CM

## 2016-07-09 DIAGNOSIS — N183 Chronic kidney disease, stage 3 unspecified: Secondary | ICD-10-CM

## 2016-07-09 DIAGNOSIS — E1122 Type 2 diabetes mellitus with diabetic chronic kidney disease: Secondary | ICD-10-CM | POA: Diagnosis not present

## 2016-07-09 DIAGNOSIS — E785 Hyperlipidemia, unspecified: Secondary | ICD-10-CM

## 2016-07-09 DIAGNOSIS — R627 Adult failure to thrive: Secondary | ICD-10-CM

## 2016-07-09 DIAGNOSIS — F419 Anxiety disorder, unspecified: Secondary | ICD-10-CM | POA: Diagnosis not present

## 2016-07-09 DIAGNOSIS — E43 Unspecified severe protein-calorie malnutrition: Secondary | ICD-10-CM

## 2016-07-09 DIAGNOSIS — D638 Anemia in other chronic diseases classified elsewhere: Secondary | ICD-10-CM | POA: Diagnosis not present

## 2016-07-09 DIAGNOSIS — R4189 Other symptoms and signs involving cognitive functions and awareness: Secondary | ICD-10-CM | POA: Diagnosis not present

## 2016-07-09 DIAGNOSIS — E1169 Type 2 diabetes mellitus with other specified complication: Secondary | ICD-10-CM

## 2016-07-09 LAB — HEPATIC FUNCTION PANEL
ALT: 5 — AB (ref 7–35)
AST: 11 — AB (ref 13–35)
Alkaline Phosphatase: 55 (ref 25–125)
Bilirubin, Total: 0.2

## 2016-07-09 LAB — CBC AND DIFFERENTIAL
HCT: 35 — AB (ref 36–46)
Hemoglobin: 10.7 — AB (ref 12.0–16.0)
Neutrophils Absolute: 4
PLATELETS: 271 (ref 150–399)
WBC: 6.4

## 2016-07-09 LAB — BASIC METABOLIC PANEL
BUN: 17 (ref 4–21)
Creatinine: 0.9 (ref 0.5–1.1)
Glucose: 137
Potassium: 4.9 (ref 3.4–5.3)
Sodium: 141 (ref 137–147)

## 2016-07-09 NOTE — Progress Notes (Signed)
Patient ID: Elizabeth Keller, female   DOB: [birth year], 83 y.o.   MRN: [id number]    HISTORY AND PHYSICAL   DATE:  07/09/2016  Location:    [facility] Room Number: [place] of Service: SNF (31)   Extended Emergency Contact Information Primary Emergency Contact: Moore,[name] Address: [address]          [place], [place] [address] Johnnette Litter of [place] Phone: [phone] Relation: Daughter Secondary Emergency Contact: [name],[name]  [place] Phone: [phone] Relation: Daughter  Advanced Directive information Does Patient Have a Medical Advance Directive?: Yes, Type of Advance Directive: Healthcare Power of [place];Living will;Out of facility DNR (pink MOST or yellow form), Pre-existing out of facility DNR order (yellow form or pink MOST form): Pink MOST form placed in chart (order not valid for inpatient use);Yellow form placed in chart (order not valid for inpatient use), Does patient want to make changes to medical advance directive?: No - Patient declined  Chief Complaint  Patient presents with  . New Admit To SNF    Admission    HPI:  83 yo female seen today as a new admission into SNF following hospital stay for sepsis, infective urethritis, change in MS, protein calorie malnutrition, FTT (bedbound), DM, stage 4 pressure ulcer of buttock, sarcoidosis, chronic dHFm hx anemia. She presented to ED with generalized body aches. UA (+) UTI-->IV zosyn-->po bactrim. Urine cx (+) multiple spp. Wound care recommended hydrotx. Hgb 10.3-->9.2; WBC peaked at 13.6K-->10.7K; albumin 2.9; Cr 0.83; glucose 100-200; HCO3 level dropped to 18; lactic acid 2.4.  She presents to SNF for short term rehab with potential for long term care.  Today she has no concerns. She is a poor historian due to cognitive impairment. NP saw her for hospital f/u and metformin/zocor/megace stopped. She is taking remeron as appetite stimulant. F/u labs already ordered. No  nursing concerns. Appetite reduced. Sleeps well.   Hypertension - diet controlled. Takes ASA 81 mg daily   Hx CKD stage 3 - nml Cr 0.83 at this time with metabolic acidosis (HCO3 at 18)  [place] of chronic disease - stable. Hgb 9.2  DJD/ spinal stenosis - pain controlled with voltaren gel four times daily as needed  DM - controlled. A1c 6.6%; off metformin. She gets CBGs three times weekly   Dyslipidemia - off zocor due to poor appetite   Chronic dCHF - EF 55-60% (03-17-16). Stable on demadex 5 mg daily  FTT/ weight loss -  weight on 06-03-16 was 135 lbs;  current weight 124 lbs. Her albumin is 2.9. She gets supplements per facility protocol. Off megace. She is on trial of remeron 7.5 mg nightly for 30 days   Anxiety - stable on klonopin 0.25 mg in the AM and 0.5 mg in the afternoon; 0.25 mg daily as needed  Stage IV sacral ulcer and bilateral unstaged heel ulcerations - followed by facility wound care provider.    Asthma - no recent exacerbations. Takes albuterol neb every 6 hours as needed;  albuterol HFA 1 or 2 puffs every 6 hours as needed.   Hx sarcoidosis - stable. No current sx's  Past Medical History:  Diagnosis Date  . Anxiety disorder   . Arthritis   . Asthma   . Chronic kidney disease   . Degenerative disk disease   . Diabetes mellitus   . GERD (gastroesophageal reflux disease)   . Hypercholesteremia   . Hypertension   . IBS (irritable bowel syndrome)   .  Sarcoidosis   . Sepsis ([place]) 06/29/2016  . Spinal stenosis     Past Surgical History:  Procedure Laterality Date  . EYE SURGERY  2017   cataract / glaucoma    Patient Care Team: Debbrah Alar, NP as PCP - General (Internal Medicine)  Social History   Social History  . Marital status: Divorced    Spouse name: N/A  . Number of children: N/A  . Years of education: N/A   Occupational History  . Not on file.   Social History Main Topics  . Smoking status: Former Research scientist (life sciences)  . Smokeless tobacco:  Never Used  . Alcohol use No  . Drug use: No  . Sexual activity: Not on file   Other Topics Concern  . Not on file   Social History Narrative  . No narrative on file     reports that she has quit smoking. She has never used smokeless tobacco. She reports that she does not drink alcohol or use drugs.  Family History  Problem Relation Age of Onset  . Hypertension Other    Family Status  Relation Status  . Other (Not Specified)    Immunization History  Administered Date(s) Administered  . Influenza,inj,Quad PF,36+ Mos 03/08/2013  . Influenza-Unspecified 12/26/2014  . PPD Test 07/04/2016  . Pneumococcal Conjugate-13 10/03/2014  . Pneumococcal Polysaccharide-23 02/09/2010    Allergies  Allergen Reactions  . Dicyclomine Other (See Comments)    Reaction:  Dizziness   . Gabapentin Other (See Comments)    Reaction:  Dizziness   . Metformin And Related Nausea And Vomiting  . Lyrica [Pregabalin] Other (See Comments)    Reaction:  Unknown  . Topiramate Other (See Comments)    Reaction:  Unknown    Medications: Patient's Medications  New Prescriptions   No medications on file  Previous Medications   ACETAMINOPHEN (TYLENOL) 500 MG TABLET    Take 500 mg by mouth. Take 2 tablets every 8 hours as needed for pain   ACIDOPHILUS (RISAQUAD) CAPS CAPSULE    Take 1 capsule by mouth daily.   ALBUTEROL (PROVENTIL HFA;VENTOLIN HFA) 108 (90 BASE) MCG/ACT INHALER    Inhale 1-2 puffs into the lungs every 6 (six) hours as needed for wheezing or shortness of breath.    ALBUTEROL (PROVENTIL) (2.5 MG/3ML) 0.083% NEBULIZER SOLUTION    Take 2.5 mg by nebulization every 6 (six) hours as needed for wheezing or shortness of breath.   ASPIRIN EC 81 MG TABLET    Take 81 mg by mouth daily.   BISACODYL (DULCOLAX) 10 MG SUPPOSITORY    Place 1 suppository (10 mg total) rectally daily as needed for moderate constipation.   CHOLECALCIFEROL (VITAMIN D) 1000 UNITS TABLET    Take 1,000 Units by mouth daily.     CLONAZEPAM (KLONOPIN) 0.5 MG TABLET    TAKE 1/2 TABLET BY MOUTH DAILY IN THE MORNING, 1 TABLET AT 2PM, MAY TAKE 1/2 TABLET BETWEEN MORNING AND 2PM, THEN DELAY 2PM DOSE TO 7PM   COLLAGENASE (SANTYL) OINTMENT    Apply 1 application topically daily.    DICLOFENAC SODIUM (VOLTAREN) 1 % GEL    APPLY 2 GRAMS EXTERNALLY TO THE AFFECTED AREA FOUR TIMES DAILY AS NEEDED FOR PAIN   MENTHOL, TOPICAL ANALGESIC, (BIOFREEZE) 4 % GEL    Apply topically. Apply to lower back topically every shift for back pain   MIRTAZAPINE (REMERON) 7.5 MG TABLET    Take 7.5 mg by mouth at bedtime.   MULTIPLE VITAMIN (MULTIVITAMIN WITH MINERALS)  TABS TABLET    Take 1 tablet by mouth daily.   MULTIPLE VITAMINS-MINERALS (DECUBI-VITE) CAPS    Take 1 capsule by mouth daily.   OMEPRAZOLE (PRILOSEC) 40 MG CAPSULE    Take 40 mg by mouth daily.    SULFAMETHOXAZOLE-TRIMETHOPRIM (BACTRIM DS,SEPTRA DS) 800-160 MG TABLET    Take 1 tablet by mouth every 12 (twelve) hours.   TORSEMIDE (DEMADEX) 5 MG TABLET    Take 5 mg by mouth daily.  Modified Medications   No medications on file  Discontinued Medications   MEGESTROL (MEGACE) 20 MG TABLET    Take 1 tablet (20 mg total) by mouth daily.   METFORMIN (GLUCOPHAGE) 500 MG TABLET    Take 500 mg by mouth daily with breakfast.    SIMVASTATIN (ZOCOR) 40 MG TABLET    Take 40 mg by mouth at bedtime.     Review of Systems  Unable to perform ROS: Other (memory loss)    Vitals:   07/09/16 1321  BP: 120/70  Pulse: 100  Resp: (!) 24  Temp: 98.4 F (36.9 C)  TempSrc: Oral  SpO2: 94%  Weight: 124 lb (56.2 kg)  Height: 5' 4"  (1.626 m)   Body mass index is 21.28 kg/m.  Physical Exam  Constitutional: She appears well-developed.  Frail appearing, lying in bed in NAD  HENT:  Mouth/Throat: Oropharynx is clear and moist. No oropharyngeal exudate.  MMM; no oral thrush  Eyes: Pupils are equal, round, and reactive to light. No scleral icterus.  Neck: Neck supple. Carotid bruit is not present.  No tracheal deviation present. No thyromegaly present.  Cardiovascular: Normal rate, regular rhythm and intact distal pulses.  Exam reveals no gallop and no friction rub.   Murmur (2/6 SEM at LSB) heard. No LE edema b/l. No calf TTP. Unna boots intact b/l.  Pulmonary/Chest: Effort normal and breath sounds normal. No stridor. No respiratory distress. She has no wheezes. She has no rales.  CTA anteriorly; poor inspiratory effort  Abdominal: Soft. Normal appearance and bowel sounds are normal. She exhibits no distension and no mass. There is no hepatomegaly. There is no tenderness. There is no rigidity, no rebound and no guarding. No hernia.  Genitourinary:  Genitourinary Comments: Foley intact and DTG clear yellow urine  Musculoskeletal: She exhibits edema.  Lymphadenopathy:    She has no cervical adenopathy.  Neurological: She is alert.  Skin: Skin is warm and dry. No rash noted.  Psychiatric: She has a normal mood and affect. Her behavior is normal.     Labs reviewed: Admission on 06/29/2016, Discharged on 07/03/2016  Component Date Value Ref Range Status  . WBC 06/29/2016 7.0  4.0 - 10.5 K/uL Final  . RBC 06/29/2016 3.56* 3.87 - 5.11 MIL/uL Final  . Hemoglobin 06/29/2016 10.3* 12.0 - 15.0 g/dL Final  . HCT 06/29/2016 31.7* 36.0 - 46.0 % Final  . MCV 06/29/2016 89.0  78.0 - 100.0 fL Final  . MCH 06/29/2016 28.9  26.0 - 34.0 pg Final  . MCHC 06/29/2016 32.5  30.0 - 36.0 g/dL Final  . RDW 06/29/2016 18.5* 11.5 - 15.5 % Final  . Platelets 06/29/2016 272  150 - 400 K/uL Final  . Neutrophils Relative % 06/29/2016 58  % Final  . Neutro Abs 06/29/2016 4.0  1.7 - 7.7 K/uL Final  . Lymphocytes Relative 06/29/2016 17  % Final  . Lymphs Abs 06/29/2016 1.2  0.7 - 4.0 K/uL Final  . Monocytes Relative 06/29/2016 11  % Final  . Monocytes  Absolute 06/29/2016 0.8  0.1 - 1.0 K/uL Final  . Eosinophils Relative 06/29/2016 13  % Final  . Eosinophils Absolute 06/29/2016 0.9* 0.0 - 0.7 K/uL Final  .  Basophils Relative 06/29/2016 1  % Final  . Basophils Absolute 06/29/2016 0.1  0.0 - 0.1 K/uL Final  . Sodium 06/29/2016 139  135 - 145 mmol/L Final  . Potassium 06/29/2016 3.9  3.5 - 5.1 mmol/L Final  . Chloride 06/29/2016 106  101 - 111 mmol/L Final  . CO2 06/29/2016 22  22 - 32 mmol/L Final  . Glucose, Bld 06/29/2016 112* 65 - 99 mg/dL Final  . BUN 06/29/2016 21* 6 - 20 mg/dL Final  . Creatinine, Ser 06/29/2016 0.79  0.44 - 1.00 mg/dL Final  . Calcium 06/29/2016 9.4  8.9 - 10.3 mg/dL Final  . Total Protein 06/29/2016 6.4* 6.5 - 8.1 g/dL Final  . Albumin 06/29/2016 3.0* 3.5 - 5.0 g/dL Final  . AST 06/29/2016 16  15 - 41 U/L Final  . ALT 06/29/2016 7* 14 - 54 U/L Final  . Alkaline Phosphatase 06/29/2016 57  38 - 126 U/L Final  . Total Bilirubin 06/29/2016 0.5  0.3 - 1.2 mg/dL Final  . GFR calc non Af Amer 06/29/2016 >60  >60 mL/min Final  . GFR calc Af Amer 06/29/2016 >60  >60 mL/min Final   Comment: (NOTE) The eGFR has been calculated using the CKD EPI equation. This calculation has not been validated in all clinical situations. eGFR's persistently <60 mL/min signify possible Chronic Kidney Disease.   . Anion gap 06/29/2016 11  5 - 15 Final  . Lactic Acid, Venous 06/29/2016 1.00  0.5 - 1.9 mmol/L Final  . Color, Urine 06/29/2016 AMBER* YELLOW Final   BIOCHEMICALS MAY BE AFFECTED BY COLOR  . APPearance 06/29/2016 CLOUDY* CLEAR Final  . Specific Gravity, Urine 06/29/2016 1.036* 1.005 - 1.030 Final  . pH 06/29/2016 5.0  5.0 - 8.0 Final  . Glucose, UA 06/29/2016 NEGATIVE  NEGATIVE mg/dL Final  . Hgb urine dipstick 06/29/2016 SMALL* NEGATIVE Final  . Bilirubin Urine 06/29/2016 SMALL* NEGATIVE Final  . Ketones, ur 06/29/2016 20* NEGATIVE mg/dL Final  . Protein, ur 06/29/2016 30* NEGATIVE mg/dL Final  . Nitrite 06/29/2016 NEGATIVE  NEGATIVE Final  . Leukocytes, UA 06/29/2016 LARGE* NEGATIVE Final  . RBC / HPF 06/29/2016 TOO NUMEROUS TO COUNT  0 - 5 RBC/hpf Final  . WBC, UA  06/29/2016 TOO NUMEROUS TO COUNT  0 - 5 WBC/hpf Final  . Bacteria, UA 06/29/2016 MANY* NONE SEEN Final  . Squamous Epithelial / LPF 06/29/2016 0-5* NONE SEEN Final  . Mucous 06/29/2016 PRESENT   Final  . Budding Yeast 06/29/2016 PRESENT   Final  . Hyaline Casts, UA 06/29/2016 PRESENT   Final  . Non Squamous Epithelial 06/29/2016 0-5* NONE SEEN Final  . Specimen Description 06/29/2016 URINE, RANDOM   Final  . Special Requests 06/29/2016 NONE   Final  . Culture 06/29/2016 MULTIPLE SPECIES PRESENT, SUGGEST RECOLLECTION*  Final  . Report Status 06/29/2016 07/01/2016 FINAL   Final  . Specimen Description 06/29/2016 BLOOD BLOOD RIGHT FOREARM   Final  . Special Requests 06/29/2016 IN PEDIATRIC BOTTLE Blood Culture adequate volume   Final  . Culture 06/29/2016    Final                   Value:NO GROWTH 5 DAYS Performed at [facility], [place] [address] [place],  [address]   . Report Status 06/29/2016 07/04/2016 FINAL  Final  . Specimen Description 06/30/2016 BLOOD LEFT HAND   Final  . Special Requests 06/30/2016 IN PEDIATRIC BOTTLE Blood Culture adequate volume   Final  . Culture 06/30/2016    Final                   Value:NO GROWTH 5 DAYS Performed at [facility], [place] [address] [place], [place] [address]   . Report Status 06/30/2016 07/05/2016 FINAL   Final  . Cortisol, Plasma 06/30/2016 22.7  ug/dL Final   Comment: (NOTE) AM    6.7 - 22.6 ug/dL PM   <10.0       ug/dL Performed at [facility], [place] [address], [place], [place] [address]   . Lactic Acid, Venous 06/30/2016 2.4* 0.5 - 1.9 mmol/L Final   Comment: CRITICAL RESULT CALLED TO, READ BACK BY AND VERIFIED WITH: RACHEL CORCORAN,RN [id number] @ [facility]   . Troponin I 06/30/2016 <0.03  <0.03 ng/mL Final  . MRSA by PCR 06/30/2016 NEGATIVE  NEGATIVE Final   Comment:        The GeneXpert MRSA Assay (FDA approved for NASAL specimens only), is one component of a comprehensive MRSA  colonization surveillance program. It is not intended to diagnose MRSA infection nor to guide or monitor treatment for MRSA infections.   . Sodium 06/30/2016 142  135 - 145 mmol/L Final  . Potassium 06/30/2016 3.7  3.5 - 5.1 mmol/L Final  . Chloride 06/30/2016 112* 101 - 111 mmol/L Final  . CO2 06/30/2016 18* 22 - 32 mmol/L Final  . Glucose, Bld 06/30/2016 104* 65 - 99 mg/dL Final  . BUN 06/30/2016 22* 6 - 20 mg/dL Final  . Creatinine, Ser 06/30/2016 0.83  0.44 - 1.00 mg/dL Final  . Calcium 06/30/2016 8.7* 8.9 - 10.3 mg/dL Final  . GFR calc non Af Amer 06/30/2016 >60  >60 mL/min Final  . GFR calc Af Amer 06/30/2016 >60  >60 mL/min Final   Comment: (NOTE) The eGFR has been calculated using the CKD EPI equation. This calculation has not been validated in all clinical situations. eGFR's persistently <60 mL/min signify possible Chronic Kidney Disease.   . Anion gap 06/30/2016 12  5 - 15 Final  . WBC 06/30/2016 13.6* 4.0 - 10.5 K/uL Final  . RBC 06/30/2016 3.57* 3.87 - 5.11 MIL/uL Final  . Hemoglobin 06/30/2016 10.3* 12.0 - 15.0 g/dL Final  . HCT 06/30/2016 32.7* 36.0 - 46.0 % Final  . MCV 06/30/2016 91.6  78.0 - 100.0 fL Final  . MCH 06/30/2016 28.9  26.0 - 34.0 pg Final  . MCHC 06/30/2016 31.5  30.0 - 36.0 g/dL Final  . RDW 06/30/2016 18.7* 11.5 - 15.5 % Final  . Platelets 06/30/2016 218  150 - 400 K/uL Final  . Magnesium 06/30/2016 1.6* 1.7 - 2.4 mg/dL Final  . Total Protein 06/30/2016 5.9* 6.5 - 8.1 g/dL Final  . Albumin 06/30/2016 2.9* 3.5 - 5.0 g/dL Final  . AST 06/30/2016 19  15 - 41 U/L Final  . ALT 06/30/2016 7* 14 - 54 U/L Final  . Alkaline Phosphatase 06/30/2016 55  38 - 126 U/L Final  . Total Bilirubin 06/30/2016 1.0  0.3 - 1.2 mg/dL Final  . Bilirubin, Direct 06/30/2016 0.2  0.1 - 0.5 mg/dL Final  . Indirect Bilirubin 06/30/2016 0.8  0.3 - 0.9 mg/dL Final  . Lactic Acid, Venous 06/30/2016 1.3  0.5 - 1.9 mmol/L Final  . Lactic Acid, Venous 06/30/2016 1.4  0.5 - 1.9  mmol/L Final  . Procalcitonin 06/30/2016 2.27  ng/mL Final   Comment:        Interpretation: PCT > 2 ng/mL: Systemic infection (sepsis) is likely, unless other causes are known. (NOTE)         ICU PCT Algorithm               Non ICU PCT Algorithm    ----------------------------     ------------------------------         PCT < 0.25 ng/mL                 PCT < 0.1 ng/mL     Stopping of antibiotics            Stopping of antibiotics       strongly encouraged.               strongly encouraged.    ----------------------------     ------------------------------       PCT level decrease by               PCT < 0.25 ng/mL       >= 80% from peak PCT       OR PCT 0.25 - 0.5 ng/mL          Stopping of antibiotics                                             encouraged.     Stopping of antibiotics           encouraged.    ----------------------------     ------------------------------       PCT level decrease by              PCT >= 0.25 ng/mL       < 80% from peak PCT        AND PCT >= 0.5 ng/mL            Continuing antibiotics                                                                        encouraged.       Continuing antibiotics            encouraged.    ----------------------------     ------------------------------     PCT level increase compared          PCT > 0.5 ng/mL         with peak PCT AND          PCT >= 0.5 ng/mL             Escalation of antibiotics                                          strongly encouraged.      Escalation of antibiotics        strongly encouraged.   . Prothrombin Time 06/30/2016 16.4* 11.4 - 15.2 seconds Final  . INR 06/30/2016  1.31   Final  . aPTT 06/30/2016 23* 24 - 36 seconds Final  . Troponin I 06/30/2016 <0.03  <0.03 ng/mL Final  . Glucose-Capillary 06/30/2016 192* 65 - 99 mg/dL Final  . Comment 1 06/30/2016 Notify RN   Final  . Comment 2 06/30/2016 Document in Chart   Final  . Glucose-Capillary 06/30/2016 157* 65 - 99 mg/dL Final  .  Comment 1 06/30/2016 Notify RN   Final  . Comment 2 06/30/2016 Document in Chart   Final  . Glucose-Capillary 06/30/2016 119* 65 - 99 mg/dL Final  . Comment 1 06/30/2016 Notify RN   Final  . Comment 2 06/30/2016 Document in Chart   Final  . WBC 07/01/2016 10.7* 4.0 - 10.5 K/uL Final  . RBC 07/01/2016 3.06* 3.87 - 5.11 MIL/uL Final  . Hemoglobin 07/01/2016 9.2* 12.0 - 15.0 g/dL Final  . HCT 07/01/2016 27.4* 36.0 - 46.0 % Final  . MCV 07/01/2016 89.5  78.0 - 100.0 fL Final  . MCH 07/01/2016 30.1  26.0 - 34.0 pg Final  . MCHC 07/01/2016 33.6  30.0 - 36.0 g/dL Final  . RDW 07/01/2016 19.1* 11.5 - 15.5 % Final  . Platelets 07/01/2016 223  150 - 400 K/uL Final  . Neutrophils Relative % 07/01/2016 92  % Final  . Neutro Abs 07/01/2016 9.9* 1.7 - 7.7 K/uL Final  . Lymphocytes Relative 07/01/2016 5  % Final  . Lymphs Abs 07/01/2016 0.5* 0.7 - 4.0 K/uL Final  . Monocytes Relative 07/01/2016 2  % Final  . Monocytes Absolute 07/01/2016 0.2  0.1 - 1.0 K/uL Final  . Eosinophils Relative 07/01/2016 1  % Final  . Eosinophils Absolute 07/01/2016 0.1  0.0 - 0.7 K/uL Final  . Basophils Relative 07/01/2016 0  % Final  . Basophils Absolute 07/01/2016 0.0  0.0 - 0.1 K/uL Final  . Sodium 07/01/2016 139  135 - 145 mmol/L Final  . Potassium 07/01/2016 3.7  3.5 - 5.1 mmol/L Final  . Chloride 07/01/2016 113* 101 - 111 mmol/L Final  . CO2 07/01/2016 19* 22 - 32 mmol/L Final  . Glucose, Bld 07/01/2016 204* 65 - 99 mg/dL Final  . BUN 07/01/2016 24* 6 - 20 mg/dL Final  . Creatinine, Ser 07/01/2016 0.83  0.44 - 1.00 mg/dL Final  . Calcium 07/01/2016 8.2* 8.9 - 10.3 mg/dL Final  . GFR calc non Af Amer 07/01/2016 >60  >60 mL/min Final  . GFR calc Af Amer 07/01/2016 >60  >60 mL/min Final   Comment: (NOTE) The eGFR has been calculated using the CKD EPI equation. This calculation has not been validated in all clinical situations. eGFR's persistently <60 mL/min signify possible Chronic Kidney Disease.   . Anion gap  07/01/2016 7  5 - 15 Final  . Magnesium 07/01/2016 2.3  1.7 - 2.4 mg/dL Final  . Glucose-Capillary 06/30/2016 163* 65 - 99 mg/dL Final  . Glucose-Capillary 07/01/2016 172* 65 - 99 mg/dL Final  . Glucose-Capillary 07/01/2016 237* 65 - 99 mg/dL Final  . Glucose-Capillary 07/01/2016 214* 65 - 99 mg/dL Final  . Glucose-Capillary 07/02/2016 227* 65 - 99 mg/dL Final  . Glucose-Capillary 07/02/2016 195* 65 - 99 mg/dL Final  . Glucose-Capillary 07/02/2016 215* 65 - 99 mg/dL Final  . Glucose-Capillary 07/03/2016 149* 65 - 99 mg/dL Final  . Glucose-Capillary 07/03/2016 170* 65 - 99 mg/dL Final  Admission on 06/26/2016, Discharged on 06/26/2016  Component Date Value Ref Range Status  . Glucose-Capillary 06/26/2016 167* 65 - 99 mg/dL Final  Hospital Outpatient  Visit on 06/22/2016  Component Date Value Ref Range Status  . Specimen Description 06/22/2016 FOOT RIGHT CALCANEOUS   Final  . Special Requests 06/22/2016 NONE   Final  . Gram Stain 06/22/2016    Final                   Value:FEW WBC PRESENT, PREDOMINANTLY MONONUCLEAR NO ORGANISMS SEEN   . Culture 06/22/2016    Final                   Value:NORMAL SKIN FLORA Performed at [facility], [place] [address] [place], [place] [address]   . Report Status 06/22/2016 06/25/2016 FINAL   Final  Admission on 06/13/2016, Discharged on 06/13/2016  Component Date Value Ref Range Status  . Color, Urine 06/13/2016 YELLOW  YELLOW Final  . APPearance 06/13/2016 CLOUDY* CLEAR Final  . Specific Gravity, Urine 06/13/2016 1.019  1.005 - 1.030 Final  . pH 06/13/2016 7.0  5.0 - 8.0 Final  . Glucose, UA 06/13/2016 NEGATIVE  NEGATIVE mg/dL Final  . Hgb urine dipstick 06/13/2016 SMALL* NEGATIVE Final  . Bilirubin Urine 06/13/2016 NEGATIVE  NEGATIVE Final  . Ketones, ur 06/13/2016 NEGATIVE  NEGATIVE mg/dL Final  . Protein, ur 06/13/2016 NEGATIVE  NEGATIVE mg/dL Final  . Nitrite 06/13/2016 NEGATIVE  NEGATIVE Final  . Leukocytes, UA 06/13/2016 LARGE* NEGATIVE  Final  . RBC / HPF 06/13/2016 0-5  0 - 5 RBC/hpf Final  . WBC, UA 06/13/2016 6-30  0 - 5 WBC/hpf Final  . Bacteria, UA 06/13/2016 RARE* NONE SEEN Final  . Squamous Epithelial / LPF 06/13/2016 NONE SEEN  NONE SEEN Final  Admission on 06/03/2016, Discharged on 06/03/2016  Component Date Value Ref Range Status  . Sodium 06/03/2016 143  135 - 145 mmol/L Final  . Potassium 06/03/2016 3.7  3.5 - 5.1 mmol/L Final  . Chloride 06/03/2016 104  101 - 111 mmol/L Final  . CO2 06/03/2016 26  22 - 32 mmol/L Final  . Glucose, Bld 06/03/2016 160* 65 - 99 mg/dL Final  . BUN 06/03/2016 21* 6 - 20 mg/dL Final  . Creatinine, Ser 06/03/2016 0.86  0.44 - 1.00 mg/dL Final  . Calcium 06/03/2016 9.5  8.9 - 10.3 mg/dL Final  . Total Protein 06/03/2016 7.0  6.5 - 8.1 g/dL Final  . Albumin 06/03/2016 2.7* 3.5 - 5.0 g/dL Final  . AST 06/03/2016 22  15 - 41 U/L Final  . ALT 06/03/2016 11* 14 - 54 U/L Final  . Alkaline Phosphatase 06/03/2016 79  38 - 126 U/L Final  . Total Bilirubin 06/03/2016 0.7  0.3 - 1.2 mg/dL Final  . GFR calc non Af Amer 06/03/2016 58* >60 mL/min Final  . GFR calc Af Amer 06/03/2016 >60  >60 mL/min Final   Comment: (NOTE) The eGFR has been calculated using the CKD EPI equation. This calculation has not been validated in all clinical situations. eGFR's persistently <60 mL/min signify possible Chronic Kidney Disease.   . Anion gap 06/03/2016 13  5 - 15 Final  . WBC 06/03/2016 6.7  4.0 - 10.5 K/uL Final  . RBC 06/03/2016 3.30* 3.87 - 5.11 MIL/uL Final  . Hemoglobin 06/03/2016 9.7* 12.0 - 15.0 g/dL Final  . HCT 06/03/2016 29.4* 36.0 - 46.0 % Final  . MCV 06/03/2016 89.1  78.0 - 100.0 fL Final  . MCH 06/03/2016 29.4  26.0 - 34.0 pg Final  . MCHC 06/03/2016 33.0  30.0 - 36.0 g/dL Final  . RDW 06/03/2016 17.8*  11.5 - 15.5 % Final  . Platelets 06/03/2016 429* 150 - 400 K/uL Final  . Neutrophils Relative % 06/03/2016 66  % Final  . Lymphocytes Relative 06/03/2016 16  % Final  . Monocytes  Relative 06/03/2016 12  % Final  . Eosinophils Relative 06/03/2016 5  % Final  . Basophils Relative 06/03/2016 1  % Final  . Neutro Abs 06/03/2016 4.4  1.7 - 7.7 K/uL Final  . Lymphs Abs 06/03/2016 1.1  0.7 - 4.0 K/uL Final  . Monocytes Absolute 06/03/2016 0.8  0.1 - 1.0 K/uL Final  . Eosinophils Absolute 06/03/2016 0.3  0.0 - 0.7 K/uL Final  . Basophils Absolute 06/03/2016 0.1  0.0 - 0.1 K/uL Final  . RBC Morphology 06/03/2016 ELLIPTOCYTES   Final   Comment: POLYCHROMASIA PRESENT SCHISTOCYTES NOTED ON SMEAR   . Color, Urine 06/03/2016 YELLOW  YELLOW Final  . APPearance 06/03/2016 CLOUDY* CLEAR Final  . Specific Gravity, Urine 06/03/2016 1.023  1.005 - 1.030 Final  . pH 06/03/2016 6.0  5.0 - 8.0 Final  . Glucose, UA 06/03/2016 NEGATIVE  NEGATIVE mg/dL Final  . Hgb urine dipstick 06/03/2016 NEGATIVE  NEGATIVE Final  . Bilirubin Urine 06/03/2016 SMALL* NEGATIVE Final  . Ketones, ur 06/03/2016 NEGATIVE  NEGATIVE mg/dL Final  . Protein, ur 06/03/2016 NEGATIVE  NEGATIVE mg/dL Final  . Nitrite 06/03/2016 NEGATIVE  NEGATIVE Final  . Leukocytes, UA 06/03/2016 MODERATE* NEGATIVE Final  . Lactic Acid, Venous 06/03/2016 1.29  0.5 - 1.9 mmol/L Final  . RBC / HPF 06/03/2016 0-5  0 - 5 RBC/hpf Final  . WBC, UA 06/03/2016 6-30  0 - 5 WBC/hpf Final  . Bacteria, UA 06/03/2016 MANY* NONE SEEN Final  . Squamous Epithelial / LPF 06/03/2016 0-5* NONE SEEN Final  . Mucous 06/03/2016 PRESENT   Final  Admission on 05/27/2016, Discharged on 05/27/2016  Component Date Value Ref Range Status  . Sodium 05/27/2016 140  135 - 145 mmol/L Final  . Potassium 05/27/2016 4.0  3.5 - 5.1 mmol/L Final  . Chloride 05/27/2016 103  101 - 111 mmol/L Final  . CO2 05/27/2016 26  22 - 32 mmol/L Final  . Glucose, Bld 05/27/2016 134* 65 - 99 mg/dL Final  . BUN 05/27/2016 14  6 - 20 mg/dL Final  . Creatinine, Ser 05/27/2016 0.79  0.44 - 1.00 mg/dL Final  . Calcium 05/27/2016 9.3  8.9 - 10.3 mg/dL Final  . Total Protein  05/27/2016 6.7  6.5 - 8.1 g/dL Final  . Albumin 05/27/2016 2.7* 3.5 - 5.0 g/dL Final  . AST 05/27/2016 29  15 - 41 U/L Final  . ALT 05/27/2016 13* 14 - 54 U/L Final  . Alkaline Phosphatase 05/27/2016 82  38 - 126 U/L Final  . Total Bilirubin 05/27/2016 0.7  0.3 - 1.2 mg/dL Final  . GFR calc non Af Amer 05/27/2016 >60  >60 mL/min Final  . GFR calc Af Amer 05/27/2016 >60  >60 mL/min Final   Comment: (NOTE) The eGFR has been calculated using the CKD EPI equation. This calculation has not been validated in all clinical situations. eGFR's persistently <60 mL/min signify possible Chronic Kidney Disease.   . Anion gap 05/27/2016 11  5 - 15 Final  . WBC 05/27/2016 8.9  4.0 - 10.5 K/uL Final  . RBC 05/27/2016 3.24* 3.87 - 5.11 MIL/uL Final  . Hemoglobin 05/27/2016 9.4* 12.0 - 15.0 g/dL Final  . HCT 05/27/2016 28.4* 36.0 - 46.0 % Final  . MCV 05/27/2016 87.7  78.0 - 100.0  fL Final  . MCH 05/27/2016 29.0  26.0 - 34.0 pg Final  . MCHC 05/27/2016 33.1  30.0 - 36.0 g/dL Final  . RDW 05/27/2016 16.8* 11.5 - 15.5 % Final  . Platelets 05/27/2016 458* 150 - 400 K/uL Final  . Neutrophils Relative % 05/27/2016 78  % Final  . Neutro Abs 05/27/2016 7.1  1.7 - 7.7 K/uL Final  . Lymphocytes Relative 05/27/2016 9  % Final  . Lymphs Abs 05/27/2016 0.8  0.7 - 4.0 K/uL Final  . Monocytes Relative 05/27/2016 10  % Final  . Monocytes Absolute 05/27/2016 0.9  0.1 - 1.0 K/uL Final  . Eosinophils Relative 05/27/2016 2  % Final  . Eosinophils Absolute 05/27/2016 0.2  0.0 - 0.7 K/uL Final  . Basophils Relative 05/27/2016 1  % Final  . Basophils Absolute 05/27/2016 0.1  0.0 - 0.1 K/uL Final  . Lactic Acid, Venous 05/27/2016 1.59  0.5 - 1.9 mmol/L Final  . Color, Urine 05/27/2016 YELLOW  YELLOW Final  . APPearance 05/27/2016 HAZY* CLEAR Final  . Specific Gravity, Urine 05/27/2016 1.019  1.005 - 1.030 Final  . pH 05/27/2016 7.0  5.0 - 8.0 Final  . Glucose, UA 05/27/2016 NEGATIVE  NEGATIVE mg/dL Final  . Hgb urine  dipstick 05/27/2016 NEGATIVE  NEGATIVE Final  . Bilirubin Urine 05/27/2016 NEGATIVE  NEGATIVE Final  . Ketones, ur 05/27/2016 NEGATIVE  NEGATIVE mg/dL Final  . Protein, ur 05/27/2016 30* NEGATIVE mg/dL Final  . Nitrite 05/27/2016 NEGATIVE  NEGATIVE Final  . Leukocytes, UA 05/27/2016 TRACE* NEGATIVE Final  . RBC / HPF 05/27/2016 0-5  0 - 5 RBC/hpf Final  . WBC, UA 05/27/2016 6-30  0 - 5 WBC/hpf Final  . Bacteria, UA 05/27/2016 RARE* NONE SEEN Final  . Squamous Epithelial / LPF 05/27/2016 0-5* NONE SEEN Final  . Mucous 05/27/2016 PRESENT   Final  . Hyaline Casts, UA 05/27/2016 PRESENT   Final  Office Visit on 05/26/2016  Component Date Value Ref Range Status  . Sodium 05/26/2016 138  135 - 145 mEq/L Final  . Potassium 05/26/2016 3.7  3.5 - 5.1 mEq/L Final  . Chloride 05/26/2016 101  96 - 112 mEq/L Final  . CO2 05/26/2016 26  19 - 32 mEq/L Final  . Glucose, Bld 05/26/2016 144* 70 - 99 mg/dL Final  . BUN 05/26/2016 12  6 - 23 mg/dL Final  . Creatinine, Ser 05/26/2016 0.69  0.40 - 1.20 mg/dL Final  . Total Bilirubin 05/26/2016 0.4  0.2 - 1.2 mg/dL Final  . Alkaline Phosphatase 05/26/2016 83  39 - 117 U/L Final  . AST 05/26/2016 21  0 - 37 U/L Final  . ALT 05/26/2016 10  0 - 35 U/L Final  . Total Protein 05/26/2016 6.1  6.0 - 8.3 g/dL Final  . Albumin 05/26/2016 2.8* 3.5 - 5.2 g/dL Final  . Calcium 05/26/2016 9.4  8.4 - 10.5 mg/dL Final  . GFR 05/26/2016 102.64  >60.00 mL/min Final  . WBC 05/26/2016 7.4  4.0 - 10.5 K/uL Final  . RBC 05/26/2016 3.20* 3.87 - 5.11 Mil/uL Final  . Hemoglobin 05/26/2016 9.2* 12.0 - 15.0 g/dL Final  . HCT 05/26/2016 28.8* 36.0 - 46.0 % Final  . MCV 05/26/2016 90.1  78.0 - 100.0 fl Final  . MCHC 05/26/2016 32.0  30.0 - 36.0 g/dL Final  . RDW 05/26/2016 17.7* 11.5 - 15.5 % Final  . Platelets 05/26/2016 468.0* 150.0 - 400.0 K/uL Final  . Neutrophils Relative % 05/26/2016 73.6  43.0 -  77.0 % Final  . Lymphocytes Relative 05/26/2016 12.3  12.0 - 46.0 % Final  .  Monocytes Relative 05/26/2016 9.5  3.0 - 12.0 % Final  . Eosinophils Relative 05/26/2016 3.5  0.0 - 5.0 % Final  . Basophils Relative 05/26/2016 1.1  0.0 - 3.0 % Final  . Neutro Abs 05/26/2016 5.4  1.4 - 7.7 K/uL Final  . Lymphs Abs 05/26/2016 0.9  0.7 - 4.0 K/uL Final  . Monocytes Absolute 05/26/2016 0.7  0.1 - 1.0 K/uL Final  . Eosinophils Absolute 05/26/2016 0.3  0.0 - 0.7 K/uL Final  . Basophils Absolute 05/26/2016 0.1  0.0 - 0.1 K/uL Final  Admission on 05/15/2016, Discharged on 05/20/2016  No results displayed because visit has over 200 results.    Admission on 04/17/2016, Discharged on 04/17/2016  Component Date Value Ref Range Status  . Lactic Acid, Venous 04/17/2016 1.18  0.5 - 1.9 mmol/L Final  . Sodium 04/17/2016 139  135 - 145 mmol/L Final  . Potassium 04/17/2016 4.8  3.5 - 5.1 mmol/L Final  . Chloride 04/17/2016 109  101 - 111 mmol/L Final  . CO2 04/17/2016 21* 22 - 32 mmol/L Final  . Glucose, Bld 04/17/2016 231* 65 - 99 mg/dL Final  . BUN 04/17/2016 43* 6 - 20 mg/dL Final  . Creatinine, Ser 04/17/2016 1.96* 0.44 - 1.00 mg/dL Final  . Calcium 04/17/2016 8.7* 8.9 - 10.3 mg/dL Final  . Total Protein 04/17/2016 6.4* 6.5 - 8.1 g/dL Final  . Albumin 04/17/2016 2.5* 3.5 - 5.0 g/dL Final  . AST 04/17/2016 44* 15 - 41 U/L Final  . ALT 04/17/2016 29  14 - 54 U/L Final  . Alkaline Phosphatase 04/17/2016 83  38 - 126 U/L Final  . Total Bilirubin 04/17/2016 0.4  0.3 - 1.2 mg/dL Final  . GFR calc non Af Amer 04/17/2016 21* >60 mL/min Final  . GFR calc Af Amer 04/17/2016 25* >60 mL/min Final   Comment: (NOTE) The eGFR has been calculated using the CKD EPI equation. This calculation has not been validated in all clinical situations. eGFR's persistently <60 mL/min signify possible Chronic Kidney Disease.   . Anion gap 04/17/2016 9  5 - 15 Final  . WBC 04/17/2016 9.9  4.0 - 10.5 K/uL Final  . RBC 04/17/2016 3.40* 3.87 - 5.11 MIL/uL Final  . Hemoglobin 04/17/2016 10.3* 12.0 - 15.0  g/dL Final  . HCT 04/17/2016 30.8* 36.0 - 46.0 % Final  . MCV 04/17/2016 90.6  78.0 - 100.0 fL Final  . MCH 04/17/2016 30.3  26.0 - 34.0 pg Final  . MCHC 04/17/2016 33.4  30.0 - 36.0 g/dL Final  . RDW 04/17/2016 14.9  11.5 - 15.5 % Final  . Platelets 04/17/2016 390  150 - 400 K/uL Final  . Neutrophils Relative % 04/17/2016 79  % Final  . Neutro Abs 04/17/2016 7.7  1.7 - 7.7 K/uL Final  . Lymphocytes Relative 04/17/2016 9  % Final  . Lymphs Abs 04/17/2016 0.9  0.7 - 4.0 K/uL Final  . Monocytes Relative 04/17/2016 10  % Final  . Monocytes Absolute 04/17/2016 1.0  0.1 - 1.0 K/uL Final  . Eosinophils Relative 04/17/2016 2  % Final  . Eosinophils Absolute 04/17/2016 0.2  0.0 - 0.7 K/uL Final  . Basophils Relative 04/17/2016 0  % Final  . Basophils Absolute 04/17/2016 0.0  0.0 - 0.1 K/uL Final  . WBC Morphology 04/17/2016 TOXIC GRANULATION   Final   Comment: HYPERSEGMENTED NEUT VACUOLATED NEUTROPHILS MILD LEFT SHIFT (  1-5% METAS, OCC MYELO, OCC BANDS)   . RBC Morphology 04/17/2016 ELLIPTOCYTES   Final  . Color, Urine 04/17/2016 AMBER* YELLOW Final   BIOCHEMICALS MAY BE AFFECTED BY COLOR  . APPearance 04/17/2016 CLOUDY* CLEAR Final  . Specific Gravity, Urine 04/17/2016 1.022  1.005 - 1.030 Final  . pH 04/17/2016 5.0  5.0 - 8.0 Final  . Glucose, UA 04/17/2016 NEGATIVE  NEGATIVE mg/dL Final  . Hgb urine dipstick 04/17/2016 NEGATIVE  NEGATIVE Final  . Bilirubin Urine 04/17/2016 SMALL* NEGATIVE Final  . Ketones, ur 04/17/2016 NEGATIVE  NEGATIVE mg/dL Final  . Protein, ur 04/17/2016 NEGATIVE  NEGATIVE mg/dL Final  . Nitrite 04/17/2016 NEGATIVE  NEGATIVE Final  . Leukocytes, UA 04/17/2016 TRACE* NEGATIVE Final  . Troponin I 04/17/2016 0.05* <0.03 ng/mL Final   Comment: CRITICAL RESULT CALLED TO, READ BACK BY AND VERIFIED WITH: CALLED TO A.HARTLEY ON [id number] AT 1328 BY SROY   . Fecal Occult Bld 04/17/2016 NEGATIVE  NEGATIVE Final  . RBC / HPF 04/17/2016 NONE SEEN  0 - 5 RBC/hpf Final  . WBC,  UA 04/17/2016 0-5  0 - 5 WBC/hpf Final  . Bacteria, UA 04/17/2016 MANY* NONE SEEN Final  . Squamous Epithelial / LPF 04/17/2016 0-5* NONE SEEN Final  . Budding Yeast 04/17/2016 PRESENT   Final  . Hyaline Casts, UA 04/17/2016 PRESENT   Final  . Specimen Description 04/17/2016 URINE, RANDOM   Final  . Special Requests 04/17/2016 NONE   Final  . Culture 04/17/2016 >=100,000 COLONIES/mL ENTEROCOCCUS FAECALIS*  Final  . Report Status 04/17/2016 04/19/2016 FINAL   Final  . Organism ID, Bacteria 04/17/2016 ENTEROCOCCUS FAECALIS*  Final  . Troponin I 04/17/2016 <0.03  <0.03 ng/mL Final    Dg Chest 1 View  Result Date: 06/29/2016 CLINICAL DATA:  Chest pain EXAM: CHEST 1 VIEW COMPARISON:  05/15/2016 FINDINGS: Cardiac shadow is stable. Biapical scarring is noted and stable from a prior exam. No new focal infiltrate is seen. No acute bony abnormality is noted. IMPRESSION: Chronic scarring in the apices bilaterally. Electronically Signed   By: Inez Catalina M.D.   On: 06/29/2016 20:12   Dg Shoulder Right  Result Date: 06/26/2016 CLINICAL DATA:  Chronic right shoulder pain, decreased range of motion EXAM: RIGHT SHOULDER - 2+ VIEW COMPARISON:  05/15/2016 chest x-ray FINDINGS: Advanced right shoulder degenerative arthropathy with joint space loss, sclerosis and humeral head bony spurring. Mild AC joint degenerative changes well. Bones are osteopenic. No gross malalignment or fracture. Right upper lobe chronic reticulonodular opacity compatible with history of sarcoid. IMPRESSION: Right shoulder arthropathy without acute process Osteopenia Electronically Signed   By: Jerilynn Mages.  Shick M.D.   On: 06/26/2016 12:25   Dg Abdomen 1 View  Result Date: 06/29/2016 CLINICAL DATA:  Acute onset of generalized abdominal pain and pelvic pain. Initial encounter. EXAM: ABDOMEN - 1 VIEW COMPARISON:  CT of the abdomen and pelvis performed 04/03/2016 FINDINGS: The visualized bowel gas pattern is unremarkable. Scattered air and stool  filled loops of colon are seen; no abnormal dilatation of small bowel loops is seen to suggest small bowel obstruction. No free intra-abdominal air is identified, though evaluation for free air is limited on a single supine view. Degenerative change is noted about the pubic symphysis; the sacroiliac joints are unremarkable in appearance. IMPRESSION: Unremarkable bowel gas pattern; no free intra-abdominal air seen. Moderate to large amount of stool noted in the colon. Electronically Signed   By: Garald Balding M.D.   On: 06/29/2016 20:24  Assessment/Plan   ICD-10-CM   1. Failure to thrive in adult R62.7   2. Stage IV pressure ulcer of sacral region ([place]) L89.154   3. Chronic kidney disease, stage III (moderate) - resolved N18.3   4. Type 2 diabetes mellitus with stage 3 chronic kidney disease, without long-term current use of insulin (HCC) E11.22    N18.3   5. Dyslipidemia associated with type 2 diabetes mellitus (HCC) E11.69    E78.5   6. Anxiety disorder, unspecified type F41.9   7. Cognitive impairment R41.89   8. Severe protein-calorie malnutrition ([place]) E43   9.       Anemia, chronic disease                               D63.8  CBC and CMP pending  Cont current meds as ordered  Nutritional supplements as ordered  PT/OT/ST as ordered  F/u with specialists as scheduled  Foley cath care as indicated to aid in healing of sacral wound  Wound care as ordered  GOAL: short term rehab then long term care highly likely. Communicated with pt and nursing.  Will follow  Aaryav Hopfensperger S. Perlie Gold  [facility] [address] [place], [place] [address] [phone] Cell (Monday-Friday 8 AM - 5 PM) [phone] After 5 PM and follow prompts

## 2016-07-14 DIAGNOSIS — L89154 Pressure ulcer of sacral region, stage 4: Secondary | ICD-10-CM | POA: Diagnosis not present

## 2016-07-14 DIAGNOSIS — L89620 Pressure ulcer of left heel, unstageable: Secondary | ICD-10-CM | POA: Diagnosis not present

## 2016-07-14 DIAGNOSIS — L89614 Pressure ulcer of right heel, stage 4: Secondary | ICD-10-CM | POA: Diagnosis not present

## 2016-07-15 ENCOUNTER — Non-Acute Institutional Stay (SKILLED_NURSING_FACILITY): Payer: Medicare Other | Admitting: Adult Health

## 2016-07-15 ENCOUNTER — Encounter: Payer: Self-pay | Admitting: Adult Health

## 2016-07-15 DIAGNOSIS — L89154 Pressure ulcer of sacral region, stage 4: Secondary | ICD-10-CM | POA: Diagnosis not present

## 2016-07-15 DIAGNOSIS — L89610 Pressure ulcer of right heel, unstageable: Secondary | ICD-10-CM | POA: Diagnosis not present

## 2016-07-15 DIAGNOSIS — L89620 Pressure ulcer of left heel, unstageable: Secondary | ICD-10-CM | POA: Diagnosis not present

## 2016-07-15 NOTE — Progress Notes (Signed)
Location:   [facility] Room Number: 113 B Place of Service:  SNF (31)   CODE STATUS: DNR  Allergies  Allergen Reactions  . Dicyclomine Other (See Comments)    Reaction:  Dizziness   . Gabapentin Other (See Comments)    Reaction:  Dizziness   . Metformin And Related Nausea And Vomiting  . Lyrica [Pregabalin] Other (See Comments)    Reaction:  Unknown  . Topiramate Other (See Comments)    Reaction:  Unknown    Chief Complaint  Patient presents with  . Acute Visit    Discoloration of Toes on Right foot    HPI:  Staff reports that she has black areas on the 2-4th toes on the right foot. She is unable to participate in the hpi or ros.   Past Medical History:  Diagnosis Date  . Anxiety disorder   . Arthritis   . Asthma   . Chronic kidney disease   . Degenerative disk disease   . Diabetes mellitus   . GERD (gastroesophageal reflux disease)   . Hypercholesteremia   . Hypertension   . IBS (irritable bowel syndrome)   . Sarcoidosis   . Sepsis (HCC) 06/29/2016  . Spinal stenosis     Past Surgical History:  Procedure Laterality Date  . EYE SURGERY  2017   cataract / glaucoma    Social History   Social History  . Marital status: Divorced    Spouse name: N/A  . Number of children: N/A  . Years of education: N/A   Occupational History  . Not on file.   Social History Main Topics  . Smoking status: Former Games developer  . Smokeless tobacco: Never Used  . Alcohol use No  . Drug use: No  . Sexual activity: Not on file   Other Topics Concern  . Not on file   Social History Narrative  . No narrative on file   Family History  Problem Relation Age of Onset  . Hypertension Other       VITAL SIGNS BP 123/72   Pulse 90   Temp 97.2 F (36.2 C)   Resp 16   Ht 5\' 4"  (1.626 m)   Wt 124 lb (56.2 kg)   SpO2 97%   BMI 21.28 kg/m   Patient's Medications  New Prescriptions   No medications on file  Previous Medications   ACETAMINOPHEN (TYLENOL)  500 MG TABLET    Take 500 mg by mouth. Take 2 tablets every 8 hours as needed for pain   ACIDOPHILUS (RISAQUAD) CAPS CAPSULE    Take 1 capsule by mouth daily.   ALBUTEROL (PROVENTIL HFA;VENTOLIN HFA) 108 (90 BASE) MCG/ACT INHALER    Inhale 2 puffs into the lungs every 6 (six) hours as needed for wheezing or shortness of breath.    ALBUTEROL (PROVENTIL) (2.5 MG/3ML) 0.083% NEBULIZER SOLUTION    Take 2.5 mg by nebulization every 6 (six) hours as needed for wheezing or shortness of breath.   AMINO ACIDS-PROTEIN HYDROLYS (FEEDING SUPPLEMENT, PRO-STAT SUGAR FREE 64,) LIQD    Take 30 mLs by mouth 3 (three) times daily with meals.   ASPIRIN EC 81 MG TABLET    Take 81 mg by mouth daily.   BISACODYL (DULCOLAX) 10 MG SUPPOSITORY    Place 1 suppository (10 mg total) rectally daily as needed for moderate constipation.   CHOLECALCIFEROL (VITAMIN D) 1000 UNITS TABLET    Take 1,000 Units by mouth daily.   CLONAZEPAM (KLONOPIN) 0.5 MG TABLET  Give 0.25 mg by mouth daily in the morning, 0.5 in the afternoon and 0.25 daily as needed   COLLAGENASE (SANTYL) OINTMENT    Apply topically to Right Heel daily and as needed   DICLOFENAC SODIUM (VOLTAREN) 1 % GEL    APPLY 2 GRAMS EXTERNALLY TO THE AFFECTED AREA FOUR TIMES DAILY AS NEEDED FOR PAIN   MENTHOL, TOPICAL ANALGESIC, (BIOFREEZE) 4 % GEL    Apply topically. Apply to lower back topically every shift for back pain   MIRTAZAPINE (REMERON) 7.5 MG TABLET    Take 7.5 mg by mouth at bedtime.   MULTIPLE VITAMIN (MULTIVITAMIN WITH MINERALS) TABS TABLET    Take 1 tablet by mouth daily.   MULTIPLE VITAMINS-MINERALS (DECUBI-VITE) CAPS    Take 1 capsule by mouth daily.   NUTRITIONAL SUPPLEMENT LIQD    House Supplement - Med Pass 120cc by mouth three times daily   OMEPRAZOLE (PRILOSEC) 40 MG CAPSULE    Take 40 mg by mouth daily.    SULFAMETHOXAZOLE-TRIMETHOPRIM (BACTRIM DS,SEPTRA DS) 800-160 MG TABLET    Take 1 tablet by mouth every 12 (twelve) hours.   TORSEMIDE (DEMADEX) 5 MG  TABLET    Take 5 mg by mouth daily.   TRAMADOL (ULTRAM) 50 MG TABLET    Take 50 mg by mouth 2 (two) times daily. Hold for Sedation / Respiratory dyspnea  Modified Medications   No medications on file  Discontinued Medications   CLONAZEPAM (KLONOPIN) 0.5 MG TABLET    TAKE 1/2 TABLET BY MOUTH DAILY IN THE MORNING, 1 TABLET AT 2PM, MAY TAKE 1/2 TABLET BETWEEN MORNING AND 2PM, THEN DELAY 2PM DOSE TO 7PM     SIGNIFICANT DIAGNOSTIC EXAMS  06-29-16:right shoulder x-ray: Right shoulder arthropathy without acute process Osteopenia  06-29-16: chest x-ray: Chronic scarring in the apices bilaterally.  06-29-16: kub: Unremarkable bowel gas pattern; no free intra-abdominal air seen. Moderate to large amount of stool noted in the colon.   LABS REVIEWED:   05-16-16: hgb a1c 6.6 06-29-16: wbc 7.0; hgb 10.3; hct 31.7; mcv 89.0; plt 272; glucose 112; bun 21; creat 0.79; k+ 3.9; na++ 139; ca 9.4; liver normal albumin 3.0 blood culture: no growth; urine culture: multiple bacteria 06-30-16: wbc 13.6; hgb 10.3; hct 32.7; mcv 91.6; plt 218; glucose 104; bun 22; creat 0.83; k+ 3.7; na++ 142;ca 8.7; liver normal albumin 2.7; mag 1.6 07-01-16: wbc 10.7; hgb 9.2; hct 27.4; mcv 89.5; plt 223; glucose 204; bun 24; creat 0.83; k+ 3.7; na++ 139; mag 2.3  07-09-16: wbc 6.4; hgb 10.7; hct 35.1; mcv 95.6; plt 271; glucose 137; bun 17.1; creat 0.92; k+ 4.9; na++ 141; ca 9.9; liver normal albumin 3.1     Review of Systems  Unable to perform ROS: Other (is sleepy )    Physical Exam  Constitutional: No distress.  Frail   Eyes: Conjunctivae are normal.  Neck: Neck supple. No JVD present. No thyromegaly present.  Cardiovascular: Normal rate, regular rhythm and intact distal pulses are faint .   Murmur heard. Respiratory: Effort normal and breath sounds normal. No respiratory distress. She has no wheezes.  GI: Soft. Bowel sounds are normal. She exhibits no distension. There is no tenderness.  Genitourinary:  Genitourinary  Comments: Foley   Musculoskeletal: She exhibits no edema.  Able to move all extremities   Lymphadenopathy:    She has no cervical adenopathy.  Neurological: She is alert.  Skin: Skin is warm and dry. She is not diaphoretic.  Stage IV sacrum: 6 x 5  x 1.4 cm pale pink wound bed Left heel: unstaged: 2 x 4 cm fluctuant eschar Right heel unstaged: 3 x 3 cm 100% adherent slough Right 2-4th toes has black areas present.    Psychiatric: She has a normal mood and affect.     ASSESSMENT/ PLAN:  1.Stage IV sacral wound and bilateral unstaged heel ulcerations:  2. Right 2-4th toes  Will set up ABI  Will use skin prep to toes twice daily     MD is aware of resident's narcotic use and is in agreement with current plan of care. We will attempt to wean resident as apropriate     Synthia Innocent NP [facility]  Contact [phone] Monday through Friday 8am- 5pm  After hours call [phone]

## 2016-07-16 DIAGNOSIS — M6281 Muscle weakness (generalized): Secondary | ICD-10-CM | POA: Diagnosis not present

## 2016-07-21 DIAGNOSIS — M6281 Muscle weakness (generalized): Secondary | ICD-10-CM | POA: Diagnosis not present

## 2016-07-22 ENCOUNTER — Non-Acute Institutional Stay (SKILLED_NURSING_FACILITY): Payer: Medicare Other | Admitting: Adult Health

## 2016-07-22 ENCOUNTER — Encounter: Payer: Self-pay | Admitting: Adult Health

## 2016-07-22 DIAGNOSIS — R627 Adult failure to thrive: Secondary | ICD-10-CM

## 2016-07-22 DIAGNOSIS — N183 Chronic kidney disease, stage 3 unspecified: Secondary | ICD-10-CM

## 2016-07-22 DIAGNOSIS — L89154 Pressure ulcer of sacral region, stage 4: Secondary | ICD-10-CM

## 2016-07-22 DIAGNOSIS — E43 Unspecified severe protein-calorie malnutrition: Secondary | ICD-10-CM

## 2016-07-22 LAB — BASIC METABOLIC PANEL
BUN: 30 — AB (ref 4–21)
Creatinine: 0.7 (ref 0.5–1.1)
Glucose: 110
Potassium: 5.3 (ref 3.4–5.3)
Sodium: 144 (ref 137–147)

## 2016-07-22 NOTE — Progress Notes (Signed)
Location:   [facility] Room Number: 113 B Place of Service:  SNF (31)   CODE STATUS: DNR  Allergies  Allergen Reactions  . Dicyclomine Other (See Comments)    Reaction:  Dizziness   . Gabapentin Other (See Comments)    Reaction:  Dizziness   . Metformin And Related Nausea And Vomiting  . Lyrica [Pregabalin] Other (See Comments)    Reaction:  Unknown  . Topiramate Other (See Comments)    Reaction:  Unknown    Chief Complaint  Patient presents with  . Acute Visit    Medication Refusal    HPI:  Her weight in May 2018 was 124 pounds in June 2018: 109 pounds; her current weight is 113 pounds. Her appetite is variable she tells me that she is not hungry. She does have sacral wounds. She is on remeron 7.5 mg daily. She will decline her medications at times.    Past Medical History:  Diagnosis Date  . Anxiety disorder   . Arthritis   . Asthma   . Chronic kidney disease   . Degenerative disk disease   . Diabetes mellitus   . GERD (gastroesophageal reflux disease)   . Hypercholesteremia   . Hypertension   . IBS (irritable bowel syndrome)   . Sarcoidosis   . Sepsis (HCC) 06/29/2016  . Spinal stenosis     Past Surgical History:  Procedure Laterality Date  . EYE SURGERY  2017   cataract / glaucoma    Social History   Social History  . Marital status: Divorced    Spouse name: N/A  . Number of children: N/A  . Years of education: N/A   Occupational History  . Not on file.   Social History Main Topics  . Smoking status: Former Games developer  . Smokeless tobacco: Never Used  . Alcohol use No  . Drug use: No  . Sexual activity: Not on file   Other Topics Concern  . Not on file   Social History Narrative  . No narrative on file   Family History  Problem Relation Age of Onset  . Hypertension Other       VITAL SIGNS BP 134/86   Pulse 78   Temp 98.4 F (36.9 C)   Resp 18   Ht 5\' 4"  (1.626 m)   Wt 115 lb (52.2 kg)   SpO2 96%   BMI 19.74  kg/m   Patient's Medications  New Prescriptions   No medications on file  Previous Medications   ACETAMINOPHEN (TYLENOL) 500 MG TABLET    Take 500 mg by mouth. Take 2 tablets every 8 hours as needed for pain   ACIDOPHILUS (RISAQUAD) CAPS CAPSULE    Take 1 capsule by mouth daily.   ALBUTEROL (PROVENTIL HFA;VENTOLIN HFA) 108 (90 BASE) MCG/ACT INHALER    Inhale 2 puffs into the lungs every 6 (six) hours as needed for wheezing or shortness of breath.    ALBUTEROL (PROVENTIL) (2.5 MG/3ML) 0.083% NEBULIZER SOLUTION    Take 2.5 mg by nebulization every 6 (six) hours as needed for wheezing or shortness of breath.   AMINO ACIDS-PROTEIN HYDROLYS (FEEDING SUPPLEMENT, PRO-STAT SUGAR FREE 64,) LIQD    Take 30 mLs by mouth 3 (three) times daily with meals.   ASPIRIN EC 81 MG TABLET    Take 81 mg by mouth daily.   BISACODYL (DULCOLAX) 10 MG SUPPOSITORY    Place 1 suppository (10 mg total) rectally daily as needed for moderate constipation.   CHOLECALCIFEROL (  VITAMIN D) 1000 UNITS TABLET    Take 1,000 Units by mouth daily.   CLONAZEPAM (KLONOPIN) 0.5 MG TABLET    Give 0.25 mg by mouth daily  as needed   DICLOFENAC SODIUM (VOLTAREN) 1 % GEL    APPLY 2 GRAMS EXTERNALLY TO THE AFFECTED AREA FOUR TIMES DAILY AS NEEDED FOR PAIN   MENTHOL, TOPICAL ANALGESIC, (BIOFREEZE) 4 % GEL    Apply topically. Apply to lower back topically every shift for back pain   MIRTAZAPINE (REMERON) 7.5 MG TABLET    Take 7.5 mg by mouth at bedtime.   MULTIPLE VITAMINS-MINERALS (DECUBI-VITE) CAPS    Take 1 capsule by mouth daily.   MULTIPLE VITAMINS-MINERALS (DECUBI-VITE) CAPS    Take 1 capsule by mouth daily.   NUTRITIONAL SUPPLEMENT LIQD    House Supplement - Med Pass 120cc by mouth three times daily   OMEPRAZOLE (PRILOSEC) 40 MG CAPSULE    Take 40 mg by mouth daily.    OSTOMY SUPPLIES (SKIN PREP SPRAY) MISC    Apply to toes topically two times a day for Blackened toes   SACCHAROMYCES BOULARDII (FLORASTOR) 250 MG CAPSULE    Take 250 mg  by mouth 2 (two) times daily.   TORSEMIDE (DEMADEX) 5 MG TABLET    Take 5 mg by mouth daily.   TRAMADOL (ULTRAM) 50 MG TABLET    Take 50 mg by mouth 2 (two) times daily. Hold for Sedation / Respiratory dyspnea  Modified Medications   No medications on file  Discontinued Medications   COLLAGENASE (SANTYL) OINTMENT    Apply topically to Right Heel daily and as needed   MULTIPLE VITAMIN (MULTIVITAMIN WITH MINERALS) TABS TABLET    Take 1 tablet by mouth daily.   SULFAMETHOXAZOLE-TRIMETHOPRIM (BACTRIM DS,SEPTRA DS) 800-160 MG TABLET    Take 1 tablet by mouth every 12 (twelve) hours.     SIGNIFICANT DIAGNOSTIC EXAMS  06-29-16:right shoulder x-ray: Right shoulder arthropathy without acute process Osteopenia  06-29-16: chest x-ray: Chronic scarring in the apices bilaterally.  06-29-16: kub: Unremarkable bowel gas pattern; no free intra-abdominal air seen. Moderate to large amount of stool noted in the colon.   LABS REVIEWED:   05-16-16: hgb a1c 6.6 06-29-16: wbc 7.0; hgb 10.3; hct 31.7; mcv 89.0; plt 272; glucose 112; bun 21; creat 0.79; k+ 3.9; na++ 139; ca 9.4; liver normal albumin 3.0 blood culture: no growth; urine culture: multiple bacteria 06-30-16: wbc 13.6; hgb 10.3; hct 32.7; mcv 91.6; plt 218; glucose 104; bun 22; creat 0.83; k+ 3.7; na++ 142;ca 8.7; liver normal albumin 2.7; mag 1.6 07-01-16: wbc 10.7; hgb 9.2; hct 27.4; mcv 89.5; plt 223; glucose 204; bun 24; creat 0.83; k+ 3.7; na++ 139; mag 2.3  07-09-16: wbc 6.4; hgb 10.7; hct 35.1; mcv 95.6; plt 271; glucose 137; bun 17.1; creat 0.92; k+ 4.9; na++ 141; ca 9.9; liver normal albumin 3.1  07-22-16: glucose 110; bun 29.7; creat 0.74; k+ 5.3; na++ 144; ca 10.2     Review of Systems  Unable to perform ROS: Other (is sleepy )    Physical Exam  Constitutional: No distress.  Frail   Eyes: Conjunctivae are normal.  Neck: Neck supple. No JVD present. No thyromegaly present.  Cardiovascular: Normal rate, regular rhythm and intact distal  pulses are faint .   Murmur heard. Respiratory: Effort normal and breath sounds normal. No respiratory distress. She has no wheezes.  GI: Soft. Bowel sounds are normal. She exhibits no distension. There is no tenderness.  Genitourinary:  Genitourinary  Comments: Foley   Musculoskeletal: She exhibits no edema.  Able to move all extremities   Lymphadenopathy:    She has no cervical adenopathy.  Neurological: She is alert.  Skin: Skin is warm and dry. She is not diaphoretic.  Stage IV sacrum: 6 x 5 x 1.4 cm pale pink wound bed Left heel: unstaged: 2 x 4 cm fluctuant eschar Right heel unstaged: 3 x 3 cm 100% adherent slough Right 2-4th toes has black areas present.    Psychiatric: She has a normal mood and affect.     ASSESSMENT/ PLAN:  1. Weight loss 2. Stage IV decube 3. CKD stage III 4. Protein calorie malnutrition  Will stop demadex vit d and florastor to help reduce medication load; she is not receiving any benefit at this time from the demadex.  Her pressure ulcers are requiring a great deal of calories to heal; which is contributing to her weight loss.  Will monitor her status.    Time spent with patient 45   minutes >50% time spent counseling; reviewing medical record; tests; labs; and developing future plan of care    MD is aware of resident's narcotic use and is in agreement with current plan of care. We will attempt to wean resident as apropriate     Synthia Innocent NP [facility] Adult Medicine  Contact [phone] Monday through Friday 8am- 5pm  After hours call [phone]

## 2016-07-23 DIAGNOSIS — M6281 Muscle weakness (generalized): Secondary | ICD-10-CM | POA: Diagnosis not present

## 2016-07-29 DIAGNOSIS — M6281 Muscle weakness (generalized): Secondary | ICD-10-CM | POA: Diagnosis not present

## 2016-08-04 DIAGNOSIS — L89620 Pressure ulcer of left heel, unstageable: Secondary | ICD-10-CM | POA: Diagnosis not present

## 2016-08-04 DIAGNOSIS — L89153 Pressure ulcer of sacral region, stage 3: Secondary | ICD-10-CM | POA: Diagnosis not present

## 2016-08-05 ENCOUNTER — Encounter: Payer: Self-pay | Admitting: Adult Health

## 2016-08-05 ENCOUNTER — Non-Acute Institutional Stay (SKILLED_NURSING_FACILITY): Payer: Medicare Other | Admitting: Adult Health

## 2016-08-05 DIAGNOSIS — E785 Hyperlipidemia, unspecified: Secondary | ICD-10-CM | POA: Diagnosis not present

## 2016-08-05 DIAGNOSIS — M6281 Muscle weakness (generalized): Secondary | ICD-10-CM | POA: Diagnosis not present

## 2016-08-05 DIAGNOSIS — E1169 Type 2 diabetes mellitus with other specified complication: Secondary | ICD-10-CM | POA: Diagnosis not present

## 2016-08-05 DIAGNOSIS — K219 Gastro-esophageal reflux disease without esophagitis: Secondary | ICD-10-CM

## 2016-08-05 DIAGNOSIS — R627 Adult failure to thrive: Secondary | ICD-10-CM

## 2016-08-05 DIAGNOSIS — E43 Unspecified severe protein-calorie malnutrition: Secondary | ICD-10-CM | POA: Diagnosis not present

## 2016-08-05 DIAGNOSIS — N183 Chronic kidney disease, stage 3 unspecified: Secondary | ICD-10-CM

## 2016-08-05 DIAGNOSIS — E1122 Type 2 diabetes mellitus with diabetic chronic kidney disease: Secondary | ICD-10-CM | POA: Diagnosis not present

## 2016-08-05 NOTE — Progress Notes (Addendum)
Location:   [facility] Room Number: 113 B Place of Service:  SNF (31)   CODE STATUS: DNR  Allergies  Allergen Reactions  . Dicyclomine Other (See Comments)    Reaction:  Dizziness   . Gabapentin Other (See Comments)    Reaction:  Dizziness   . Metformin And Related Nausea And Vomiting  . Lyrica [Pregabalin] Other (See Comments)    Reaction:  Unknown  . Topiramate Other (See Comments)    Reaction:  Unknown    Chief Complaint  Patient presents with  . Medical Management of Chronic Issues    1 month follow up    HPI:  She is a 83 year old long term resident of this facility being seen for the management of her chronic illnesses. She is unable to fully particpiate in the hpi or ros; but did tell me that she is feeling good. There are no nursing concerns at this time.   Past Medical History:  Diagnosis Date  . Anxiety disorder   . Arthritis   . Asthma   . Chronic kidney disease   . Degenerative disk disease   . Diabetes mellitus   . GERD (gastroesophageal reflux disease)   . Hypercholesteremia   . Hypertension   . IBS (irritable bowel syndrome)   . Sarcoidosis   . Sepsis (HCC) 06/29/2016  . Spinal stenosis     Past Surgical History:  Procedure Laterality Date  . EYE SURGERY  2017   cataract / glaucoma    Social History   Social History  . Marital status: Divorced    Spouse name: N/A  . Number of children: N/A  . Years of education: N/A   Occupational History  . Not on file.   Social History Main Topics  . Smoking status: Former Games developermoker  . Smokeless tobacco: Never Used  . Alcohol use No  . Drug use: No  . Sexual activity: Not on file   Other Topics Concern  . Not on file   Social History Narrative  . No narrative on file   Family History  Problem Relation Age of Onset  . Hypertension Other       VITAL SIGNS BP 134/82   Pulse 74   Temp 98.4 F (36.9 C)   Resp 18   Ht 5\' 4"  (1.626 m)   Wt 117 lb 6.4 oz (53.3 kg)   SpO2  96%   BMI 20.15 kg/m   Patient's Medications  New Prescriptions   No medications on file  Previous Medications   ACETAMINOPHEN (TYLENOL) 500 MG TABLET    Take 500 mg by mouth. Take 2 tablets every 8 hours as needed for pain   ALBUTEROL (PROVENTIL HFA;VENTOLIN HFA) 108 (90 BASE) MCG/ACT INHALER    Inhale 2 puffs into the lungs every 6 (six) hours as needed for wheezing or shortness of breath.    ALBUTEROL (PROVENTIL) (2.5 MG/3ML) 0.083% NEBULIZER SOLUTION    Take 2.5 mg by nebulization every 6 (six) hours as needed for wheezing or shortness of breath.   AMINO ACIDS-PROTEIN HYDROLYS (FEEDING SUPPLEMENT, PRO-STAT SUGAR FREE 64,) LIQD    Take 30 mLs by mouth 3 (three) times daily with meals.   ASPIRIN EC 81 MG TABLET    Take 81 mg by mouth daily.   BISACODYL (DULCOLAX) 10 MG SUPPOSITORY    Place 1 suppository (10 mg total) rectally daily as needed for moderate constipation.   CLONAZEPAM (KLONOPIN) 0.5 MG TABLET    Give 0.5 mg  by mouth two times daily to equal 0.25mg    DICLOFENAC SODIUM (VOLTAREN) 1 % GEL    APPLY 2 GRAMS EXTERNALLY TO THE AFFECTED AREA FOUR TIMES DAILY AS NEEDED FOR PAIN   MENTHOL, TOPICAL ANALGESIC, (BIOFREEZE) 4 % GEL    Apply topically. Apply to lower back topically every shift for back pain   MIRTAZAPINE (REMERON) 7.5 MG TABLET    Take 7.5 mg by mouth at bedtime.   MULTIPLE VITAMINS-MINERALS (DECUBI-VITE) CAPS    Take 1 capsule by mouth daily.   NUTRITIONAL SUPPLEMENT LIQD    House Supplement - Med Pass 120cc by mouth three times daily   NUTRITIONAL SUPPLEMENTS (NUTRITIONAL SUPPLEMENT PO)    House Supplement - Nutritional Treat with meals daily   OMEPRAZOLE (PRILOSEC) 40 MG CAPSULE    Take 40 mg by mouth daily.    OSTOMY SUPPLIES (SKIN PREP SPRAY) MISC    Apply to toes topically two times a day for Blackened toes   TRAMADOL (ULTRAM) 50 MG TABLET    Give 0.5 tablet by mouth two times a day for Pain Management to equal 25 mg  Modified Medications   No medications on file    Discontinued Medications   ACIDOPHILUS (RISAQUAD) CAPS CAPSULE    Take 1 capsule by mouth daily.   CHOLECALCIFEROL (VITAMIN D) 1000 UNITS TABLET    Take 1,000 Units by mouth daily.   MULTIPLE VITAMINS-MINERALS (DECUBI-VITE) CAPS    Take 1 capsule by mouth daily.   SACCHAROMYCES BOULARDII (FLORASTOR) 250 MG CAPSULE    Take 250 mg by mouth 2 (two) times daily.   TORSEMIDE (DEMADEX) 5 MG TABLET    Take 5 mg by mouth daily.     SIGNIFICANT DIAGNOSTIC EXAMS   06-29-16:right shoulder x-ray: Right shoulder arthropathy without acute process Osteopenia  06-29-16: chest x-ray: Chronic scarring in the apices bilaterally.  06-29-16: kub: Unremarkable bowel gas pattern; no free intra-abdominal air seen. Moderate to large amount of stool noted in the colon.  07-16-16: right lower extremity arterial doppler : no doppler evidence of stenosis limited exam    LABS REVIEWED:   05-16-16: hgb a1c 6.6 06-29-16: wbc 7.0; hgb 10.3; hct 31.7; mcv 89.0; plt 272; glucose 112; bun 21; creat 0.79; k+ 3.9; na++ 139; ca 9.4; liver normal albumin 3.0 blood culture: no growth; urine culture: multiple bacteria 06-30-16: wbc 13.6; hgb 10.3; hct 32.7; mcv 91.6; plt 218; glucose 104; bun 22; creat 0.83; k+ 3.7; na++ 142;ca 8.7; liver normal albumin 2.7; mag 1.6 07-01-16: wbc 10.7; hgb 9.2; hct 27.4; mcv 89.5; plt 223; glucose 204; bun 24; creat 0.83; k+ 3.7; na++ 139; mag 2.3  07-09-16: wbc 6.4; hgb 10.7; hct 35.1; mcv 95.6; plt 271; glucose 137; bun 17.1; creat 0.92; k+ 4.9; na++ 141; ca 9.9; liver normal albumin 3.1  07-22-16: glucose 110; bun 29.7; creat 0.74; k+ 5.3; na++ 144; ca 10.2     Review of Systems  Unable to perform ROS: Other (is sleepy )    Physical Exam  Constitutional: No distress.  Frail   Eyes: Conjunctivae are normal.  Neck: Neck supple. No JVD present. No thyromegaly present.  Cardiovascular: Normal rate, regular rhythm and intact distal pulses are faint .   Murmur heard. Respiratory: Effort normal  and breath sounds normal. No respiratory distress. She has no wheezes.  GI: Soft. Bowel sounds are normal. She exhibits no distension. There is no tenderness.  Genitourinary:  Genitourinary Comments: Foley   Musculoskeletal: She exhibits no edema.  Able to  move all extremities   Lymphadenopathy:    She has no cervical adenopathy.  Neurological: She is alert.      ASSESSMENT/ PLAN:   1. Hypertension: b/p134/82  is currently stable will continue asa 81 mg daily and will monitor   2. CKD stage III: bun 29.7 creat 0.74  3. [place]: hgb 9.2; will monitor   4. DJD has spinal stenosis: no indications of pain present; has voltaren gel four times daily as needed  5. Diabetes: hgb a1c 6.6; will check cbg three times weekly and will monitor  6. Dyslipidemia: zocor has been stopped due to poor appetite    7. CHF: EF 55-60% (03-17-16): is off demadex  will monitor   8. FTT/ weight loss: her weight on 06-03-16 was 135; pounds: her current weight is 117 pounds. Her albumin is 2.7 will continue supplements per facility protocol  Is on remeron for 30 days will continue to monitor her megace was stopped is ineffective    9. Anxiety: will continue klonopin 0.25 mg in the AM and 0.5 mg in the afternoon; has 0.25 mg daily as needed  10. Stage IV sacral wound and bilateral unstaged heel ulcerations: will continue current plan of care wound Dr.to see.   11. Asthma: has history of sarcoidosis: will continue albuterol neb every 6 hours as needed or albuterol 1 or 2 puffs every 6 hours as needed.   12. GERD: will continue prilosec 40 mg daily   MD is aware of resident's narcotic use and is in agreement with current plan of care. We will attempt to wean resident as apropriate   Synthia Innocent NP [facility] Adult Medicine  Contact [phone] Monday through Friday 8am- 5pm  After hours call [phone]

## 2016-08-06 ENCOUNTER — Non-Acute Institutional Stay (SKILLED_NURSING_FACILITY): Payer: Medicare Other | Admitting: Adult Health

## 2016-08-06 ENCOUNTER — Encounter: Payer: Self-pay | Admitting: Adult Health

## 2016-08-06 DIAGNOSIS — M25511 Pain in right shoulder: Secondary | ICD-10-CM

## 2016-08-06 DIAGNOSIS — M25512 Pain in left shoulder: Secondary | ICD-10-CM | POA: Diagnosis not present

## 2016-08-06 LAB — BASIC METABOLIC PANEL
BUN: 22 — AB (ref 4–21)
Creatinine: 0.6 (ref 0.5–1.1)
Glucose: 178
Potassium: 4.5 (ref 3.4–5.3)
Sodium: 135 — AB (ref 137–147)

## 2016-08-06 NOTE — Progress Notes (Signed)
Location:   [facility] Room Number: 113 B Place of Service:  SNF (31)   CODE STATUS: DNR Allergies  Allergen Reactions  . Dicyclomine Other (See Comments)    Reaction:  Dizziness   . Gabapentin Other (See Comments)    Reaction:  Dizziness   . Metformin And Related Nausea And Vomiting  . Lyrica [Pregabalin] Other (See Comments)    Reaction:  Unknown  . Topiramate Other (See Comments)    Reaction:  Unknown    Chief Complaint  Patient presents with  . Acute Visit    Hand Numbness    HPI:  She is telling her family that she has bilateral hand numbness; she did say that her hands hurt as well. She has equal grips present. She did complain of bilateral shoulder pain as well.   Past Medical History:  Diagnosis Date  . Anxiety disorder   . Arthritis   . Asthma   . Chronic kidney disease   . Degenerative disk disease   . Diabetes mellitus   . GERD (gastroesophageal reflux disease)   . Hypercholesteremia   . Hypertension   . IBS (irritable bowel syndrome)   . Sarcoidosis   . Sepsis (HCC) 06/29/2016  . Spinal stenosis     Past Surgical History:  Procedure Laterality Date  . EYE SURGERY  2017   cataract / glaucoma    Social History   Social History  . Marital status: Divorced    Spouse name: N/A  . Number of children: N/A  . Years of education: N/A   Occupational History  . Not on file.   Social History Main Topics  . Smoking status: Former Games developer  . Smokeless tobacco: Never Used  . Alcohol use No  . Drug use: No  . Sexual activity: Not on file   Other Topics Concern  . Not on file   Social History Narrative  . No narrative on file   Family History  Problem Relation Age of Onset  . Hypertension Other       VITAL SIGNS BP 134/82   Pulse 74   Temp 98.4 F (36.9 C)   Resp 18   Ht 5\' 4"  (1.626 m)   Wt 117 lb 6.4 oz (53.3 kg)   SpO2 96%   BMI 20.15 kg/m   Patient's Medications  New Prescriptions   No medications on file    Previous Medications   ACETAMINOPHEN (TYLENOL) 500 MG TABLET    Take 2 tablets every 8 hours as needed for pain    ALBUTEROL (PROVENTIL HFA;VENTOLIN HFA) 108 (90 BASE) MCG/ACT INHALER    Inhale 2 puffs into the lungs every 6 (six) hours as needed for wheezing or shortness of breath.    ALBUTEROL (PROVENTIL) (2.5 MG/3ML) 0.083% NEBULIZER SOLUTION    Take 2.5 mg by nebulization every 6 (six) hours as needed for wheezing or shortness of breath.   AMINO ACIDS-PROTEIN HYDROLYS (FEEDING SUPPLEMENT, PRO-STAT SUGAR FREE 64,) LIQD    Take 30 mLs by mouth 3 (three) times daily with meals.   ASPIRIN EC 81 MG TABLET    Take 81 mg by mouth daily.   BISACODYL (DULCOLAX) 10 MG SUPPOSITORY    Place 1 suppository (10 mg total) rectally daily as needed for moderate constipation.   CLONAZEPAM (KLONOPIN) 0.5 MG TABLET    Give 0.5 mg by mouth two times daily to equal 0.25mg    DICLOFENAC SODIUM (VOLTAREN) 1 % GEL    APPLY 2 GRAMS EXTERNALLY TO  THE AFFECTED AREA FOUR TIMES DAILY AS NEEDED FOR PAIN   MENTHOL, TOPICAL ANALGESIC, (BIOFREEZE) 4 % GEL    Apply topically. Apply to lower back topically every shift for back pain   MIRTAZAPINE (REMERON) 7.5 MG TABLET    Take 7.5 mg by mouth at bedtime.   MULTIPLE VITAMINS-MINERALS (DECUBI-VITE) CAPS    Take 1 capsule by mouth daily.   NUTRITIONAL SUPPLEMENT LIQD    House Supplement - Med Pass 120cc by mouth three times daily   NUTRITIONAL SUPPLEMENTS (NUTRITIONAL SUPPLEMENT PO)    House Supplement - Nutritional Treat with meals daily   OMEPRAZOLE (PRILOSEC) 40 MG CAPSULE    Take 40 mg by mouth daily.    OSTOMY SUPPLIES (SKIN PREP SPRAY) MISC    Apply to toes topically two times a day for Blackened toes   TRAMADOL (ULTRAM) 50 MG TABLET    Give 0.5 tablet by mouth three times a day for Pain Management to equal 25 mg   UNABLE TO FIND    HSG Regular Diet - HSG Puree texture, Regular consistency  Modified Medications   No medications on file  Discontinued Medications   No  medications on file     SIGNIFICANT DIAGNOSTIC EXAMS   06-29-16:right shoulder x-ray: Right shoulder arthropathy without acute process Osteopenia  06-29-16: chest x-ray: Chronic scarring in the apices bilaterally.  06-29-16: kub: Unremarkable bowel gas pattern; no free intra-abdominal air seen. Moderate to large amount of stool noted in the colon.  07-16-16: right lower extremity arterial doppler : no doppler evidence of stenosis limited exam    LABS REVIEWED:   05-16-16: hgb a1c 6.6 06-29-16: wbc 7.0; hgb 10.3; hct 31.7; mcv 89.0; plt 272; glucose 112; bun 21; creat 0.79; k+ 3.9; na++ 139; ca 9.4; liver normal albumin 3.0 blood culture: no growth; urine culture: multiple bacteria 06-30-16: wbc 13.6; hgb 10.3; hct 32.7; mcv 91.6; plt 218; glucose 104; bun 22; creat 0.83; k+ 3.7; na++ 142;ca 8.7; liver normal albumin 2.7; mag 1.6 07-01-16: wbc 10.7; hgb 9.2; hct 27.4; mcv 89.5; plt 223; glucose 204; bun 24; creat 0.83; k+ 3.7; na++ 139; mag 2.3  07-09-16: wbc 6.4; hgb 10.7; hct 35.1; mcv 95.6; plt 271; glucose 137; bun 17.1; creat 0.92; k+ 4.9; na++ 141; ca 9.9; liver normal albumin 3.1  07-22-16: glucose 110; bun 29.7; creat 0.74; k+ 5.3; na++ 144; ca 10.2     Review of Systems  Unable to perform ROS: Other (is sleepy )    Physical Exam  Constitutional: No distress.  Frail   Eyes: Conjunctivae are normal.  Neck: Neck supple. No JVD present. No thyromegaly present.  Cardiovascular: Normal rate, regular rhythm and intact distal pulses are faint .   Murmur heard. Respiratory: Effort normal and breath sounds normal. No respiratory distress. She has no wheezes.  GI: Soft. Bowel sounds are normal. She exhibits no distension. There is no tenderness.  Genitourinary:  Genitourinary Comments: Foley   Musculoskeletal: She exhibits no edema.  Able to move all extremities Hands with equal grips strength 4/5 Bilateral shoulders with tenderness to palpation.    Lymphadenopathy:    She has no  cervical adenopathy.  Neurological: She is alert.    ASSESSMENT/ PLAN:  1. Bilateral shoulder pain: will begin her on biofreeze to her shoulders twice daily and will get an x-ray of her bilateral shoulders will monitor her status.    MD is aware of resident's narcotic use and is in agreement with current plan of care. We  will attempt to wean resident as apropriate   Synthia Innocent NP [facility] Adult Medicine  Contact [phone] Monday through Friday 8am- 5pm  After hours call [phone]

## 2016-08-10 ENCOUNTER — Telehealth: Payer: Self-pay | Admitting: *Deleted

## 2016-08-10 NOTE — Telephone Encounter (Signed)
[facility] to [facility]/02

## 2016-08-11 DIAGNOSIS — L89154 Pressure ulcer of sacral region, stage 4: Secondary | ICD-10-CM | POA: Diagnosis not present

## 2016-08-11 DIAGNOSIS — L89620 Pressure ulcer of left heel, unstageable: Secondary | ICD-10-CM | POA: Diagnosis not present

## 2016-08-11 DIAGNOSIS — M6281 Muscle weakness (generalized): Secondary | ICD-10-CM | POA: Diagnosis not present

## 2016-08-11 DIAGNOSIS — L89153 Pressure ulcer of sacral region, stage 3: Secondary | ICD-10-CM | POA: Diagnosis not present

## 2016-08-13 DIAGNOSIS — K219 Gastro-esophageal reflux disease without esophagitis: Secondary | ICD-10-CM | POA: Insufficient documentation

## 2016-08-14 DIAGNOSIS — D638 Anemia in other chronic diseases classified elsewhere: Secondary | ICD-10-CM | POA: Insufficient documentation

## 2016-08-14 DIAGNOSIS — R4189 Other symptoms and signs involving cognitive functions and awareness: Secondary | ICD-10-CM | POA: Insufficient documentation

## 2016-08-18 ENCOUNTER — Ambulatory Visit: Payer: Medicare Other | Admitting: Podiatry

## 2016-08-18 DIAGNOSIS — L89624 Pressure ulcer of left heel, stage 4: Secondary | ICD-10-CM | POA: Diagnosis not present

## 2016-08-18 DIAGNOSIS — L89614 Pressure ulcer of right heel, stage 4: Secondary | ICD-10-CM | POA: Diagnosis not present

## 2016-08-18 DIAGNOSIS — L89153 Pressure ulcer of sacral region, stage 3: Secondary | ICD-10-CM | POA: Diagnosis not present

## 2016-08-18 DIAGNOSIS — L89154 Pressure ulcer of sacral region, stage 4: Secondary | ICD-10-CM | POA: Diagnosis not present

## 2016-08-19 ENCOUNTER — Non-Acute Institutional Stay (SKILLED_NURSING_FACILITY): Payer: Medicare Other | Admitting: Adult Health

## 2016-08-19 DIAGNOSIS — F411 Generalized anxiety disorder: Secondary | ICD-10-CM | POA: Diagnosis not present

## 2016-08-19 DIAGNOSIS — M6281 Muscle weakness (generalized): Secondary | ICD-10-CM | POA: Diagnosis not present

## 2016-08-19 DIAGNOSIS — R627 Adult failure to thrive: Secondary | ICD-10-CM | POA: Diagnosis not present

## 2016-08-19 DIAGNOSIS — L89154 Pressure ulcer of sacral region, stage 4: Secondary | ICD-10-CM | POA: Diagnosis not present

## 2016-08-19 NOTE — Progress Notes (Signed)
Location:   [facility] Room Number: 113 Place of Service:  SNF ([id number])   CODE STATUS: dnr  Allergies  Allergen Reactions  . Dicyclomine Other (See Comments)    Reaction:  Dizziness   . Gabapentin Other (See Comments)    Reaction:  Dizziness   . Metformin And Related Nausea And Vomiting  . Lyrica [Pregabalin] Other (See Comments)    Reaction:  Unknown  . Topiramate Other (See Comments)    Reaction:  Unknown    Chief Complaint  Patient presents with  . Acute Visit    anxiety     HPI:  Staff reports that her daughter feels as though she is getting too much anxiety medication and would like for her klonopin to be twice daily as needed. This morning she is not complaining of pain this morning unless she is being physically manipulated. She is unable to fully participate in the hpi or ros but tell me that she is anxious alot.    Past Medical History:  Diagnosis Date  . Anxiety disorder   . Arthritis   . Asthma   . Chronic kidney disease   . Degenerative disk disease   . Diabetes mellitus   . GERD (gastroesophageal reflux disease)   . Hypercholesteremia   . Hypertension   . IBS (irritable bowel syndrome)   . Sarcoidosis   . Sepsis (HCC) 06/29/2016  . Spinal stenosis     Past Surgical History:  Procedure Laterality Date  . EYE SURGERY  2017   cataract / glaucoma    Social History   Social History  . Marital status: Divorced    Spouse name: N/A  . Number of children: N/A  . Years of education: N/A   Occupational History  . Not on file.   Social History Main Topics  . Smoking status: Former Games developer  . Smokeless tobacco: Never Used  . Alcohol use No  . Drug use: No  . Sexual activity: Not on file   Other Topics Concern  . Not on file   Social History Narrative  . No narrative on file   Family History  Problem Relation Age of Onset  . Hypertension Other       VITAL SIGNS BP 128/71   Pulse 70   Temp 97.8 F (36.6 C)   Resp 16    Ht 5\' 4"  (1.626 m)   Wt 124 lb 4.8 oz (56.4 kg)   SpO2 97%   BMI 21.34 kg/m   Patient's Medications  New Prescriptions   No medications on file  Previous Medications   ACETAMINOPHEN (TYLENOL) 500 MG TABLET    Take 2 tablets every 8 hours as needed for pain    ALBUTEROL (PROVENTIL HFA;VENTOLIN HFA) 108 (90 BASE) MCG/ACT INHALER    Inhale 2 puffs into the lungs every 6 (six) hours as needed for wheezing or shortness of breath.    ALBUTEROL (PROVENTIL) (2.5 MG/3ML) 0.083% NEBULIZER SOLUTION    Take 2.5 mg by nebulization every 6 (six) hours as needed for wheezing or shortness of breath.   AMINO ACIDS-PROTEIN HYDROLYS (FEEDING SUPPLEMENT, PRO-STAT SUGAR FREE 64,) LIQD    Take 30 mLs by mouth 3 (three) times daily with meals.   ASPIRIN EC 81 MG TABLET    Take 81 mg by mouth daily.   BISACODYL (DULCOLAX) 10 MG SUPPOSITORY    Place 1 suppository (10 mg total) rectally daily as needed for moderate constipation.   CLONAZEPAM (KLONOPIN) 0.5 MG TABLET  Give 0.25 mg by mouth three times daily for agitation.  Hold for Sedation   DICLOFENAC SODIUM (VOLTAREN) 1 % GEL    APPLY 2 GRAMS EXTERNALLY TO THE AFFECTED AREA FOUR TIMES DAILY AS NEEDED FOR PAIN   MENTHOL, TOPICAL ANALGESIC, (BIOFREEZE) 4 % GEL    Apply topically. Apply to lower back topically every shift for back pain.  Apply to bilateral shoulders topically every day and night shift for pain management   MULTIPLE VITAMINS-MINERALS (DECUBI-VITE) CAPS    Take 1 capsule by mouth daily.   NUTRITIONAL SUPPLEMENT LIQD    House Supplement - Med Pass 120cc by mouth three times daily   NUTRITIONAL SUPPLEMENTS (NUTRITIONAL SUPPLEMENT PO)    House Supplement - Give Nutritional Treat by mouth with meals daily   OMEPRAZOLE (PRILOSEC) 40 MG CAPSULE    Take 40 mg by mouth daily.    OSTOMY SUPPLIES (SKIN PREP SPRAY) MISC    Apply to toes topically two times a day for Blackened toes   TRAMADOL (ULTRAM) 50 MG TABLET    Give 0.5 tablet by mouth three times a day  for Pain Management to equal 25 mg   UNABLE TO FIND    HSG Regular Diet - HSG Puree texture, Regular consistency  Modified Medications   No medications on file  Discontinued Medications   NUTRITIONAL SUPPLEMENTS (NUTRITIONAL SUPPLEMENT PO)    House Supplement - Nutritional Treat with meals daily     SIGNIFICANT DIAGNOSTIC EXAMS   06-29-16:right shoulder x-ray: Right shoulder arthropathy without acute process Osteopenia  06-29-16: chest x-ray: Chronic scarring in the apices bilaterally.  06-29-16: kub: Unremarkable bowel gas pattern; no free intra-abdominal air seen. Moderate to large amount of stool noted in the colon.  07-16-16: right lower extremity arterial doppler : no doppler evidence of stenosis limited exam    LABS REVIEWED:   05-16-16: hgb a1c 6.6 06-29-16: wbc 7.0; hgb 10.3; hct 31.7; mcv 89.0; plt 272; glucose 112; bun 21; creat 0.79; k+ 3.9; na++ 139; ca 9.4; liver normal albumin 3.0 blood culture: no growth; urine culture: multiple bacteria 06-30-16: wbc 13.6; hgb 10.3; hct 32.7; mcv 91.6; plt 218; glucose 104; bun 22; creat 0.83; k+ 3.7; na++ 142;ca 8.7; liver normal albumin 2.7; mag 1.6 07-01-16: wbc 10.7; hgb 9.2; hct 27.4; mcv 89.5; plt 223; glucose 204; bun 24; creat 0.83; k+ 3.7; na++ 139; mag 2.3  07-09-16: wbc 6.4; hgb 10.7; hct 35.1; mcv 95.6; plt 271; glucose 137; bun 17.1; creat 0.92; k+ 4.9; na++ 141; ca 9.9; liver normal albumin 3.1  07-22-16: glucose 110; bun 29.7; creat 0.74; k+ 5.3; na++ 144; ca 10.2  08-06-16: glucose 178; bun 22.2; creat 0.6; k+ 4.5; na++ 135; ca 9.3     Review of Systems  Unable to perform ROS: Other (cognitive impairment )    Physical Exam  Constitutional: No distress.  Frail   Eyes: Conjunctivae are normal.  Neck: Neck supple. No JVD present. No thyromegaly present.  Cardiovascular: Normal rate, regular rhythm and intact distal pulses are faint .   Murmur heard. Respiratory: Effort normal and breath sounds normal. No respiratory  distress. She has no wheezes.  GI: Soft. Bowel sounds are normal. She exhibits no distension. There is no tenderness.  Genitourinary:  Genitourinary Comments: Foley   Musculoskeletal: She exhibits no edema.  Able to move all extremities Hands with equal grips strength 4/5 Bilateral shoulders with tenderness to palpation.   Left posterior heel stage IV: 3.1 x 4.5 x 0.3 cm: santyl  Right posterior heel stage IV: 1.8 x 4.3 x 0.3 cm calcium alginate Right sacrum stage IV:   2.8 x 1.8 x 0.5 cm wet to dry Coccyx stage III: 2.4 x 1.5 x 0.1 cm medihoney Lymphadenopathy:    She has no cervical adenopathy.  Neurological: She is alert.       ASSESSMENT/ PLAN:  1. Anxiety disorder: will change her klonopin to 0.5 mg twice daily as needed for 14 days then stop. Will continue to monitor her status.   2. Multiple ulcerations: will continue current plan of care; is followed by wound dr.   3. Failure to thrive: albumin 3.1 her weight is 124 pounds. Will continue supplements as directed and will monitor    MD is aware of resident's narcotic use and is in agreement with current plan of care. We will attempt to wean resident as apropriate   Synthia Innocenteborah Brandi Armato NP [facility]  Contact [phone] Monday through Friday 8am- 5pm  After hours call [phone]

## 2016-08-24 ENCOUNTER — Non-Acute Institutional Stay (SKILLED_NURSING_FACILITY): Payer: Medicare Other | Admitting: Internal Medicine

## 2016-08-24 ENCOUNTER — Encounter: Payer: Self-pay | Admitting: Internal Medicine

## 2016-08-24 DIAGNOSIS — L89624 Pressure ulcer of left heel, stage 4: Secondary | ICD-10-CM | POA: Diagnosis not present

## 2016-08-24 DIAGNOSIS — L89614 Pressure ulcer of right heel, stage 4: Secondary | ICD-10-CM

## 2016-08-24 DIAGNOSIS — F411 Generalized anxiety disorder: Secondary | ICD-10-CM | POA: Diagnosis not present

## 2016-08-24 DIAGNOSIS — R4189 Other symptoms and signs involving cognitive functions and awareness: Secondary | ICD-10-CM

## 2016-08-24 DIAGNOSIS — L89154 Pressure ulcer of sacral region, stage 4: Secondary | ICD-10-CM

## 2016-08-24 DIAGNOSIS — R451 Restlessness and agitation: Secondary | ICD-10-CM

## 2016-08-24 DIAGNOSIS — R627 Adult failure to thrive: Secondary | ICD-10-CM | POA: Diagnosis not present

## 2016-08-24 NOTE — Progress Notes (Signed)
Patient ID: Elizabeth Keller, female   DOB: [birth year], 83 y.o.   MRN: [id number]    DATE:  08/24/2016  Location:    [facility] Room Number: [place] of Service: SNF (31)   Extended Emergency Contact Information Primary Emergency Contact: Elizabeth Keller Address: [address]          [place], [place] [address] Johnnette Litter of [place] Phone: [phone] Relation: Daughter Secondary Emergency Contact: Elizabeth Keller  [place] Phone: [phone] Relation: Daughter  Advanced Directive information Does Patient Have a Medical Advance Directive?: Yes, Type of Advance Directive: Healthcare Power of [place];Out of facility DNR (pink MOST or yellow form), Pre-existing out of facility DNR order (yellow form or pink MOST form): Pink MOST form placed in chart (order not valid for inpatient use), Does patient want to make changes to medical advance directive?: No - Patient declined  Chief Complaint  Patient presents with  . Acute Visit    Family concerns with medications    HPI:  83 yo female long term resident seen today for medical concerns. Daughter, Ms Elizabeth Keller, at bedside and voices concerns for the care of pt's multiple bed sores as she believes she is not turned on a regular basis. She is also concerned that pt is very agitated at times and refuses to eat meals when fed by family (pt's son). Daughter requests that pt's lunch tray be left at her bedside so that pt's son can feed her. She states pt is more receptive to family feeding her than SNF staff. Daughter admits that pt has refused to accept food offered to her by family at times and clamps her mouth shut, refusing to eat. Daughter asks that klonopin be adjusted to help with pt's agitation. The klonopin dose/frequrncy has been adjusted in the past per daughter request. I spoke with her by phone call x 21 minutes on 07/28/16 regarding similar concerns/questions related to pt's care. She is her emergency  contact.   Hypertension - diet controlled. Takes ASA 81 mg daily  CKD  - hx stage 3 disease but now nml.  Cr 0.6  Hx anemia - stable. Hgb 10.7.   DJD/ spinal stenosis - pain controlled with voltaren gel four times daily as needed; biofreeze topically qshift  DM - diet controlled. A1c 6.6%; she gets CBG three times weekly  Dyslipidemia - off simvastatin due to poor po intake   Hx CHF -  EF 55-60% (03-17-16). No longer on demadex  FTT/ weight loss - she gets nutritional supplements per facility protocol. Last albumin 3.1. No longer on remeron/megace as ineffective. Weight 118 lb.   Anxiety/agitation - currently on klonopin 0.5 mg BID prn x 14 days only with plan not to renew (written 08/19/16). uncontrolled  Multiple decubitus ulcers (Stage IV sacral and b/l heel ulcerations; stage 3 coccyx ulcer) - followed by facility wound care provider. Stable. She has a foley cath for comfort and reduce secondary infection of sacral/coccyx ulcers  Asthma/hx sarcoidosis - no recent exacerbation on albuterol neb every 6 hours as needed or albuterol 1 or 2 puffs every 6 hours as needed.   GERD - stable on prilosec 40 mg daily   Pt's SNF chart reviewed. She has had multiple med changes since admission  Past Medical History:  Diagnosis Date  . Anxiety disorder   . Arthritis   . Asthma   . Chronic kidney disease   . Degenerative disk disease   . Diabetes mellitus   .  GERD (gastroesophageal reflux disease)   . Hypercholesteremia   . Hypertension   . IBS (irritable bowel syndrome)   . Sarcoidosis   . Sepsis ([place]) 06/29/2016  . Spinal stenosis     Past Surgical History:  Procedure Laterality Date  . EYE SURGERY  2017   cataract / glaucoma    Patient Care Team: Debbrah Alar, NP as PCP - General (Internal Medicine)  Social History   Social History  . Marital status: Divorced    Spouse name: N/A  . Number of children: N/A  . Years of education: N/A   Occupational History  .  Not on file.   Social History Main Topics  . Smoking status: Former Research scientist (life sciences)  . Smokeless tobacco: Never Used  . Alcohol use No  . Drug use: No  . Sexual activity: Not on file   Other Topics Concern  . Not on file   Social History Narrative  . No narrative on file     reports that she has quit smoking. She has never used smokeless tobacco. She reports that she does not drink alcohol or use drugs.  Family History  Problem Relation Age of Onset  . Hypertension Other    Family Status  Relation Status  . Other (Not Specified)    Immunization History  Administered Date(s) Administered  . Influenza,inj,Quad PF,36+ Mos 03/08/2013  . Influenza-Unspecified 12/26/2014  . PPD Test 07/04/2016  . Pneumococcal Conjugate-13 10/03/2014  . Pneumococcal Polysaccharide-23 02/09/2010    Allergies  Allergen Reactions  . Dicyclomine Other (See Comments)    Reaction:  Dizziness   . Gabapentin Other (See Comments)    Reaction:  Dizziness   . Metformin And Related Nausea And Vomiting  . Lyrica [Pregabalin] Other (See Comments)    Reaction:  Unknown  . Topiramate Other (See Comments)    Reaction:  Unknown    Medications: Patient's Medications  New Prescriptions   No medications on file  Previous Medications   ACETAMINOPHEN (TYLENOL) 500 MG TABLET    Take 2 tablets every 8 hours as needed for pain    ALBUTEROL (PROVENTIL HFA;VENTOLIN HFA) 108 (90 BASE) MCG/ACT INHALER    Inhale 2 puffs into the lungs every 6 (six) hours as needed for wheezing or shortness of breath.    ALBUTEROL (PROVENTIL) (2.5 MG/3ML) 0.083% NEBULIZER SOLUTION    Take 2.5 mg by nebulization every 6 (six) hours as needed for wheezing or shortness of breath.   AMINO ACIDS-PROTEIN HYDROLYS (FEEDING SUPPLEMENT, PRO-STAT SUGAR FREE 64,) LIQD    Take 30 mLs by mouth 3 (three) times daily with meals.   ASPIRIN EC 81 MG TABLET    Take 81 mg by mouth daily.   BISACODYL (DULCOLAX) 10 MG SUPPOSITORY    Place 1 suppository (10 mg  total) rectally daily as needed for moderate constipation.   CLONAZEPAM (KLONOPIN) 0.5 MG TABLET    Give 0.5 mg by mouth two times daily to equal 0.32m   DICLOFENAC SODIUM (VOLTAREN) 1 % GEL    APPLY 2 GRAMS EXTERNALLY TO THE AFFECTED AREA FOUR TIMES DAILY AS NEEDED FOR PAIN   MENTHOL, TOPICAL ANALGESIC, (BIOFREEZE) 4 % GEL    Apply topically. Apply to lower back topically every shift for back pain   MULTIPLE VITAMINS-MINERALS (DECUBI-VITE) CAPS    Take 1 capsule by mouth daily.   NUTRITIONAL SUPPLEMENT LIQD    House Supplement - Med Pass 120cc by mouth three times daily   NUTRITIONAL SUPPLEMENTS (NUTRITIONAL SUPPLEMENT PO)  House Supplement - Nutritional Treat with meals daily   OMEPRAZOLE (PRILOSEC) 40 MG CAPSULE    Take 40 mg by mouth daily.    OSTOMY SUPPLIES (SKIN PREP SPRAY) MISC    Apply to toes topically two times a day for Blackened toes   TRAMADOL (ULTRAM) 50 MG TABLET    Give 0.5 tablet by mouth three times a day for Pain Management to equal 25 mg   UNABLE TO FIND    HSG Regular Diet - HSG Puree texture, Regular consistency  Modified Medications   No medications on file  Discontinued Medications   No medications on file    Review of Systems  Unable to perform ROS: Other (cognitive impairment)    Vitals:   08/24/16 1510  BP: (!) 97/57  Pulse: 99  Resp: 18  Temp: (!) 97.5 F (36.4 C)  TempSrc: Oral  SpO2: 97%  Weight: 118 lb 4.8 oz (53.7 kg)  Height: '5\' 4"'$  (1.626 m)   Body mass index is 20.31 kg/m.  Physical Exam  Constitutional: She appears well-developed.  Frail appearing lying in bed in NAD, thin  Genitourinary:  Genitourinary Comments: Foley cath intact and DTG  Musculoskeletal: She exhibits edema.  Neurological: She is alert.  Psychiatric: She has a normal mood and affect. Her behavior is normal.     Labs reviewed: Abstract on 07/22/2016  Component Date Value Ref Range Status  . Glucose 07/22/2016 110   Final  . BUN 07/22/2016 30* 4 - 21 Final  .  Creatinine 07/22/2016 0.7  0.5 - 1.1 Final  . Potassium 07/22/2016 5.3  3.4 - 5.3 Final  . Sodium 07/22/2016 144  137 - 147 Final  Nursing Home on 07/22/2016  Component Date Value Ref Range Status  . Glucose 08/06/2016 178   Final  . BUN 08/06/2016 22* 4 - 21 Final  . Creatinine 08/06/2016 0.6  0.5 - 1.1 Final  . Potassium 08/06/2016 4.5  3.4 - 5.3 Final  . Sodium 08/06/2016 135* 137 - 147 Final  Abstract on 07/15/2016  Component Date Value Ref Range Status  . Hemoglobin 07/09/2016 10.7* 12.0 - 16.0 Final  . HCT 07/09/2016 35* 36 - 46 Final  . Neutrophils Absolute 07/09/2016 4   Final  . Platelets 07/09/2016 271  150 - 399 Final  . WBC 07/09/2016 6.4   Final  . Glucose 07/09/2016 137   Final  . BUN 07/09/2016 17  4 - 21 Final  . Creatinine 07/09/2016 0.9  0.5 - 1.1 Final  . Potassium 07/09/2016 4.9  3.4 - 5.3 Final  . Sodium 07/09/2016 141  137 - 147 Final  . Alkaline Phosphatase 07/09/2016 55  25 - 125 Final  . ALT 07/09/2016 5* 7 - 35 Final  . AST 07/09/2016 11* 13 - 35 Final  . Bilirubin, Total 07/09/2016 0.2   Final  Admission on 06/29/2016, Discharged on 07/03/2016  Component Date Value Ref Range Status  . WBC 06/29/2016 7.0  4.0 - 10.5 K/uL Final  . RBC 06/29/2016 3.56* 3.87 - 5.11 MIL/uL Final  . Hemoglobin 06/29/2016 10.3* 12.0 - 15.0 g/dL Final  . HCT 06/29/2016 31.7* 36.0 - 46.0 % Final  . MCV 06/29/2016 89.0  78.0 - 100.0 fL Final  . MCH 06/29/2016 28.9  26.0 - 34.0 pg Final  . MCHC 06/29/2016 32.5  30.0 - 36.0 g/dL Final  . RDW 06/29/2016 18.5* 11.5 - 15.5 % Final  . Platelets 06/29/2016 272  150 - 400 K/uL Final  .  Neutrophils Relative % 06/29/2016 58  % Final  . Neutro Abs 06/29/2016 4.0  1.7 - 7.7 K/uL Final  . Lymphocytes Relative 06/29/2016 17  % Final  . Lymphs Abs 06/29/2016 1.2  0.7 - 4.0 K/uL Final  . Monocytes Relative 06/29/2016 11  % Final  . Monocytes Absolute 06/29/2016 0.8  0.1 - 1.0 K/uL Final  . Eosinophils Relative 06/29/2016 13  % Final  .  Eosinophils Absolute 06/29/2016 0.9* 0.0 - 0.7 K/uL Final  . Basophils Relative 06/29/2016 1  % Final  . Basophils Absolute 06/29/2016 0.1  0.0 - 0.1 K/uL Final  . Sodium 06/29/2016 139  135 - 145 mmol/L Final  . Potassium 06/29/2016 3.9  3.5 - 5.1 mmol/L Final  . Chloride 06/29/2016 106  101 - 111 mmol/L Final  . CO2 06/29/2016 22  22 - 32 mmol/L Final  . Glucose, Bld 06/29/2016 112* 65 - 99 mg/dL Final  . BUN 06/29/2016 21* 6 - 20 mg/dL Final  . Creatinine, Ser 06/29/2016 0.79  0.44 - 1.00 mg/dL Final  . Calcium 06/29/2016 9.4  8.9 - 10.3 mg/dL Final  . Total Protein 06/29/2016 6.4* 6.5 - 8.1 g/dL Final  . Albumin 06/29/2016 3.0* 3.5 - 5.0 g/dL Final  . AST 06/29/2016 16  15 - 41 U/L Final  . ALT 06/29/2016 7* 14 - 54 U/L Final  . Alkaline Phosphatase 06/29/2016 57  38 - 126 U/L Final  . Total Bilirubin 06/29/2016 0.5  0.3 - 1.2 mg/dL Final  . GFR calc non Af Amer 06/29/2016 >60  >60 mL/min Final  . GFR calc Af Amer 06/29/2016 >60  >60 mL/min Final   Comment: (NOTE) The eGFR has been calculated using the CKD EPI equation. This calculation has not been validated in all clinical situations. eGFR's persistently <60 mL/min signify possible Chronic Kidney Disease.   . Anion gap 06/29/2016 11  5 - 15 Final  . Lactic Acid, Venous 06/29/2016 1.00  0.5 - 1.9 mmol/L Final  . Color, Urine 06/29/2016 AMBER* YELLOW Final   BIOCHEMICALS MAY BE AFFECTED BY COLOR  . APPearance 06/29/2016 CLOUDY* CLEAR Final  . Specific Gravity, Urine 06/29/2016 1.036* 1.005 - 1.030 Final  . pH 06/29/2016 5.0  5.0 - 8.0 Final  . Glucose, UA 06/29/2016 NEGATIVE  NEGATIVE mg/dL Final  . Hgb urine dipstick 06/29/2016 SMALL* NEGATIVE Final  . Bilirubin Urine 06/29/2016 SMALL* NEGATIVE Final  . Ketones, ur 06/29/2016 20* NEGATIVE mg/dL Final  . Protein, ur 06/29/2016 30* NEGATIVE mg/dL Final  . Nitrite 06/29/2016 NEGATIVE  NEGATIVE Final  . Leukocytes, UA 06/29/2016 LARGE* NEGATIVE Final  . RBC / HPF 06/29/2016  TOO NUMEROUS TO COUNT  0 - 5 RBC/hpf Final  . WBC, UA 06/29/2016 TOO NUMEROUS TO COUNT  0 - 5 WBC/hpf Final  . Bacteria, UA 06/29/2016 MANY* NONE SEEN Final  . Squamous Epithelial / LPF 06/29/2016 0-5* NONE SEEN Final  . Mucous 06/29/2016 PRESENT   Final  . Budding Yeast 06/29/2016 PRESENT   Final  . Hyaline Casts, UA 06/29/2016 PRESENT   Final  . Non Squamous Epithelial 06/29/2016 0-5* NONE SEEN Final  . Specimen Description 06/29/2016 URINE, RANDOM   Final  . Special Requests 06/29/2016 NONE   Final  . Culture 06/29/2016 MULTIPLE SPECIES PRESENT, SUGGEST RECOLLECTION*  Final  . Report Status 06/29/2016 07/01/2016 FINAL   Final  . Specimen Description 06/29/2016 BLOOD BLOOD RIGHT FOREARM   Final  . Special Requests 06/29/2016 IN PEDIATRIC BOTTLE Blood Culture adequate volume  Final  . Culture 06/29/2016    Final                   Value:NO GROWTH 5 DAYS Performed at [facility], [place] [address] [place], [place] [address]   . Report Status 06/29/2016 07/04/2016 FINAL   Final  . Specimen Description 06/30/2016 BLOOD LEFT HAND   Final  . Special Requests 06/30/2016 IN PEDIATRIC BOTTLE Blood Culture adequate volume   Final  . Culture 06/30/2016    Final                   Value:NO GROWTH 5 DAYS Performed at [facility], [place] [address] [place], [place] [address]   . Report Status 06/30/2016 07/05/2016 FINAL   Final  . Cortisol, Plasma 06/30/2016 22.7  ug/dL Final   Comment: (NOTE) AM    6.7 - 22.6 ug/dL PM   <10.0       ug/dL Performed at [facility], [place] [address] [place], [place] [address]   . Lactic Acid, Venous 06/30/2016 2.4* 0.5 - 1.9 mmol/L Final   Comment: CRITICAL RESULT CALLED TO, READ BACK BY AND VERIFIED WITH: RACHEL CORCORAN,RN [id number] @ [facility]   . Troponin I 06/30/2016 <0.03  <0.03 ng/mL Final  . MRSA by PCR 06/30/2016 NEGATIVE  NEGATIVE Final   Comment:        The GeneXpert MRSA Assay (FDA approved for NASAL specimens only), is one  component of a comprehensive MRSA colonization surveillance program. It is not intended to diagnose MRSA infection nor to guide or monitor treatment for MRSA infections.   . Sodium 06/30/2016 142  135 - 145 mmol/L Final  . Potassium 06/30/2016 3.7  3.5 - 5.1 mmol/L Final  . Chloride 06/30/2016 112* 101 - 111 mmol/L Final  . CO2 06/30/2016 18* 22 - 32 mmol/L Final  . Glucose, Bld 06/30/2016 104* 65 - 99 mg/dL Final  . BUN 06/30/2016 22* 6 - 20 mg/dL Final  . Creatinine, Ser 06/30/2016 0.83  0.44 - 1.00 mg/dL Final  . Calcium 06/30/2016 8.7* 8.9 - 10.3 mg/dL Final  . GFR calc non Af Amer 06/30/2016 >60  >60 mL/min Final  . GFR calc Af Amer 06/30/2016 >60  >60 mL/min Final   Comment: (NOTE) The eGFR has been calculated using the CKD EPI equation. This calculation has not been validated in all clinical situations. eGFR's persistently <60 mL/min signify possible Chronic Kidney Disease.   . Anion gap 06/30/2016 12  5 - 15 Final  . WBC 06/30/2016 13.6* 4.0 - 10.5 K/uL Final  . RBC 06/30/2016 3.57* 3.87 - 5.11 MIL/uL Final  . Hemoglobin 06/30/2016 10.3* 12.0 - 15.0 g/dL Final  . HCT 06/30/2016 32.7* 36.0 - 46.0 % Final  . MCV 06/30/2016 91.6  78.0 - 100.0 fL Final  . MCH 06/30/2016 28.9  26.0 - 34.0 pg Final  . MCHC 06/30/2016 31.5  30.0 - 36.0 g/dL Final  . RDW 06/30/2016 18.7* 11.5 - 15.5 % Final  . Platelets 06/30/2016 218  150 - 400 K/uL Final  . Magnesium 06/30/2016 1.6* 1.7 - 2.4 mg/dL Final  . Total Protein 06/30/2016 5.9* 6.5 - 8.1 g/dL Final  . Albumin 06/30/2016 2.9* 3.5 - 5.0 g/dL Final  . AST 06/30/2016 19  15 - 41 U/L Final  . ALT 06/30/2016 7* 14 - 54 U/L Final  . Alkaline Phosphatase 06/30/2016 55  38 - 126 U/L Final  . Total  Bilirubin 06/30/2016 1.0  0.3 - 1.2 mg/dL Final  . Bilirubin, Direct 06/30/2016 0.2  0.1 - 0.5 mg/dL Final  . Indirect Bilirubin 06/30/2016 0.8  0.3 - 0.9 mg/dL Final  . Lactic Acid, Venous 06/30/2016 1.3  0.5 - 1.9 mmol/L Final  . Lactic Acid,  Venous 06/30/2016 1.4  0.5 - 1.9 mmol/L Final  . Procalcitonin 06/30/2016 2.27  ng/mL Final   Comment:        Interpretation: PCT > 2 ng/mL: Systemic infection (sepsis) is likely, unless other causes are known. (NOTE)         ICU PCT Algorithm               Non ICU PCT Algorithm    ----------------------------     ------------------------------         PCT < 0.25 ng/mL                 PCT < 0.1 ng/mL     Stopping of antibiotics            Stopping of antibiotics       strongly encouraged.               strongly encouraged.    ----------------------------     ------------------------------       PCT level decrease by               PCT < 0.25 ng/mL       >= 80% from peak PCT       OR PCT 0.25 - 0.5 ng/mL          Stopping of antibiotics                                             encouraged.     Stopping of antibiotics           encouraged.    ----------------------------     ------------------------------       PCT level decrease by              PCT >= 0.25 ng/mL       < 80% from peak PCT        AND PCT >= 0.5 ng/mL            Continuing antibiotics                                                                        encouraged.       Continuing antibiotics            encouraged.    ----------------------------     ------------------------------     PCT level increase compared          PCT > 0.5 ng/mL         with peak PCT AND          PCT >= 0.5 ng/mL             Escalation of antibiotics  strongly encouraged.      Escalation of antibiotics        strongly encouraged.   . Prothrombin Time 06/30/2016 16.4* 11.4 - 15.2 seconds Final  . INR 06/30/2016 1.31   Final  . aPTT 06/30/2016 23* 24 - 36 seconds Final  . Troponin I 06/30/2016 <0.03  <0.03 ng/mL Final  . Glucose-Capillary 06/30/2016 192* 65 - 99 mg/dL Final  . Comment 1 06/30/2016 Notify RN   Final  . Comment 2 06/30/2016 Document in Chart   Final  . Glucose-Capillary 06/30/2016  157* 65 - 99 mg/dL Final  . Comment 1 06/30/2016 Notify RN   Final  . Comment 2 06/30/2016 Document in Chart   Final  . Glucose-Capillary 06/30/2016 119* 65 - 99 mg/dL Final  . Comment 1 06/30/2016 Notify RN   Final  . Comment 2 06/30/2016 Document in Chart   Final  . WBC 07/01/2016 10.7* 4.0 - 10.5 K/uL Final  . RBC 07/01/2016 3.06* 3.87 - 5.11 MIL/uL Final  . Hemoglobin 07/01/2016 9.2* 12.0 - 15.0 g/dL Final  . HCT 07/01/2016 27.4* 36.0 - 46.0 % Final  . MCV 07/01/2016 89.5  78.0 - 100.0 fL Final  . MCH 07/01/2016 30.1  26.0 - 34.0 pg Final  . MCHC 07/01/2016 33.6  30.0 - 36.0 g/dL Final  . RDW 07/01/2016 19.1* 11.5 - 15.5 % Final  . Platelets 07/01/2016 223  150 - 400 K/uL Final  . Neutrophils Relative % 07/01/2016 92  % Final  . Neutro Abs 07/01/2016 9.9* 1.7 - 7.7 K/uL Final  . Lymphocytes Relative 07/01/2016 5  % Final  . Lymphs Abs 07/01/2016 0.5* 0.7 - 4.0 K/uL Final  . Monocytes Relative 07/01/2016 2  % Final  . Monocytes Absolute 07/01/2016 0.2  0.1 - 1.0 K/uL Final  . Eosinophils Relative 07/01/2016 1  % Final  . Eosinophils Absolute 07/01/2016 0.1  0.0 - 0.7 K/uL Final  . Basophils Relative 07/01/2016 0  % Final  . Basophils Absolute 07/01/2016 0.0  0.0 - 0.1 K/uL Final  . Sodium 07/01/2016 139  135 - 145 mmol/L Final  . Potassium 07/01/2016 3.7  3.5 - 5.1 mmol/L Final  . Chloride 07/01/2016 113* 101 - 111 mmol/L Final  . CO2 07/01/2016 19* 22 - 32 mmol/L Final  . Glucose, Bld 07/01/2016 204* 65 - 99 mg/dL Final  . BUN 07/01/2016 24* 6 - 20 mg/dL Final  . Creatinine, Ser 07/01/2016 0.83  0.44 - 1.00 mg/dL Final  . Calcium 07/01/2016 8.2* 8.9 - 10.3 mg/dL Final  . GFR calc non Af Amer 07/01/2016 >60  >60 mL/min Final  . GFR calc Af Amer 07/01/2016 >60  >60 mL/min Final   Comment: (NOTE) The eGFR has been calculated using the CKD EPI equation. This calculation has not been validated in all clinical situations. eGFR's persistently <60 mL/min signify possible Chronic  Kidney Disease.   . Anion gap 07/01/2016 7  5 - 15 Final  . Magnesium 07/01/2016 2.3  1.7 - 2.4 mg/dL Final  . Glucose-Capillary 06/30/2016 163* 65 - 99 mg/dL Final  . Glucose-Capillary 07/01/2016 172* 65 - 99 mg/dL Final  . Glucose-Capillary 07/01/2016 237* 65 - 99 mg/dL Final  . Glucose-Capillary 07/01/2016 214* 65 - 99 mg/dL Final  . Glucose-Capillary 07/02/2016 227* 65 - 99 mg/dL Final  . Glucose-Capillary 07/02/2016 195* 65 - 99 mg/dL Final  . Glucose-Capillary 07/02/2016 215* 65 - 99 mg/dL Final  . Glucose-Capillary 07/03/2016 149* 65 - 99 mg/dL Final  .  Glucose-Capillary 07/03/2016 170* 65 - 99 mg/dL Final  Admission on 06/26/2016, Discharged on 06/26/2016  Component Date Value Ref Range Status  . Glucose-Capillary 06/26/2016 167* 65 - 99 mg/dL Final  Hospital Outpatient Visit on 06/22/2016  Component Date Value Ref Range Status  . Specimen Description 06/22/2016 FOOT RIGHT CALCANEOUS   Final  . Special Requests 06/22/2016 NONE   Final  . Gram Stain 06/22/2016    Final                   Value:FEW WBC PRESENT, PREDOMINANTLY MONONUCLEAR NO ORGANISMS SEEN   . Culture 06/22/2016    Final                   Value:NORMAL SKIN FLORA Performed at [facility], [place] [address] [place], [place] [address]   . Report Status 06/22/2016 06/25/2016 FINAL   Final  Admission on 06/13/2016, Discharged on 06/13/2016  Component Date Value Ref Range Status  . Color, Urine 06/13/2016 YELLOW  YELLOW Final  . APPearance 06/13/2016 CLOUDY* CLEAR Final  . Specific Gravity, Urine 06/13/2016 1.019  1.005 - 1.030 Final  . pH 06/13/2016 7.0  5.0 - 8.0 Final  . Glucose, UA 06/13/2016 NEGATIVE  NEGATIVE mg/dL Final  . Hgb urine dipstick 06/13/2016 SMALL* NEGATIVE Final  . Bilirubin Urine 06/13/2016 NEGATIVE  NEGATIVE Final  . Ketones, ur 06/13/2016 NEGATIVE  NEGATIVE mg/dL Final  . Protein, ur 06/13/2016 NEGATIVE  NEGATIVE mg/dL Final  . Nitrite 06/13/2016 NEGATIVE  NEGATIVE Final  .  Leukocytes, UA 06/13/2016 LARGE* NEGATIVE Final  . RBC / HPF 06/13/2016 0-5  0 - 5 RBC/hpf Final  . WBC, UA 06/13/2016 6-30  0 - 5 WBC/hpf Final  . Bacteria, UA 06/13/2016 RARE* NONE SEEN Final  . Squamous Epithelial / LPF 06/13/2016 NONE SEEN  NONE SEEN Final  Admission on 06/03/2016, Discharged on 06/03/2016  Component Date Value Ref Range Status  . Sodium 06/03/2016 143  135 - 145 mmol/L Final  . Potassium 06/03/2016 3.7  3.5 - 5.1 mmol/L Final  . Chloride 06/03/2016 104  101 - 111 mmol/L Final  . CO2 06/03/2016 26  22 - 32 mmol/L Final  . Glucose, Bld 06/03/2016 160* 65 - 99 mg/dL Final  . BUN 06/03/2016 21* 6 - 20 mg/dL Final  . Creatinine, Ser 06/03/2016 0.86  0.44 - 1.00 mg/dL Final  . Calcium 06/03/2016 9.5  8.9 - 10.3 mg/dL Final  . Total Protein 06/03/2016 7.0  6.5 - 8.1 g/dL Final  . Albumin 06/03/2016 2.7* 3.5 - 5.0 g/dL Final  . AST 06/03/2016 22  15 - 41 U/L Final  . ALT 06/03/2016 11* 14 - 54 U/L Final  . Alkaline Phosphatase 06/03/2016 79  38 - 126 U/L Final  . Total Bilirubin 06/03/2016 0.7  0.3 - 1.2 mg/dL Final  . GFR calc non Af Amer 06/03/2016 58* >60 mL/min Final  . GFR calc Af Amer 06/03/2016 >60  >60 mL/min Final   Comment: (NOTE) The eGFR has been calculated using the CKD EPI equation. This calculation has not been validated in all clinical situations. eGFR's persistently <60 mL/min signify possible Chronic Kidney Disease.   . Anion gap 06/03/2016 13  5 - 15 Final  . WBC 06/03/2016 6.7  4.0 - 10.5 K/uL Final  . RBC 06/03/2016 3.30* 3.87 - 5.11 MIL/uL Final  . Hemoglobin 06/03/2016 9.7* 12.0 - 15.0 g/dL Final  . HCT 06/03/2016 29.4* 36.0 - 46.0 % Final  . MCV  06/03/2016 89.1  78.0 - 100.0 fL Final  . MCH 06/03/2016 29.4  26.0 - 34.0 pg Final  . MCHC 06/03/2016 33.0  30.0 - 36.0 g/dL Final  . RDW 06/03/2016 17.8* 11.5 - 15.5 % Final  . Platelets 06/03/2016 429* 150 - 400 K/uL Final  . Neutrophils Relative % 06/03/2016 66  % Final  . Lymphocytes Relative  06/03/2016 16  % Final  . Monocytes Relative 06/03/2016 12  % Final  . Eosinophils Relative 06/03/2016 5  % Final  . Basophils Relative 06/03/2016 1  % Final  . Neutro Abs 06/03/2016 4.4  1.7 - 7.7 K/uL Final  . Lymphs Abs 06/03/2016 1.1  0.7 - 4.0 K/uL Final  . Monocytes Absolute 06/03/2016 0.8  0.1 - 1.0 K/uL Final  . Eosinophils Absolute 06/03/2016 0.3  0.0 - 0.7 K/uL Final  . Basophils Absolute 06/03/2016 0.1  0.0 - 0.1 K/uL Final  . RBC Morphology 06/03/2016 ELLIPTOCYTES   Final   Comment: POLYCHROMASIA PRESENT SCHISTOCYTES NOTED ON SMEAR   . Color, Urine 06/03/2016 YELLOW  YELLOW Final  . APPearance 06/03/2016 CLOUDY* CLEAR Final  . Specific Gravity, Urine 06/03/2016 1.023  1.005 - 1.030 Final  . pH 06/03/2016 6.0  5.0 - 8.0 Final  . Glucose, UA 06/03/2016 NEGATIVE  NEGATIVE mg/dL Final  . Hgb urine dipstick 06/03/2016 NEGATIVE  NEGATIVE Final  . Bilirubin Urine 06/03/2016 SMALL* NEGATIVE Final  . Ketones, ur 06/03/2016 NEGATIVE  NEGATIVE mg/dL Final  . Protein, ur 06/03/2016 NEGATIVE  NEGATIVE mg/dL Final  . Nitrite 06/03/2016 NEGATIVE  NEGATIVE Final  . Leukocytes, UA 06/03/2016 MODERATE* NEGATIVE Final  . Lactic Acid, Venous 06/03/2016 1.29  0.5 - 1.9 mmol/L Final  . RBC / HPF 06/03/2016 0-5  0 - 5 RBC/hpf Final  . WBC, UA 06/03/2016 6-30  0 - 5 WBC/hpf Final  . Bacteria, UA 06/03/2016 MANY* NONE SEEN Final  . Squamous Epithelial / LPF 06/03/2016 0-5* NONE SEEN Final  . Mucous 06/03/2016 PRESENT   Final  Admission on 05/27/2016, Discharged on 05/27/2016  Component Date Value Ref Range Status  . Sodium 05/27/2016 140  135 - 145 mmol/L Final  . Potassium 05/27/2016 4.0  3.5 - 5.1 mmol/L Final  . Chloride 05/27/2016 103  101 - 111 mmol/L Final  . CO2 05/27/2016 26  22 - 32 mmol/L Final  . Glucose, Bld 05/27/2016 134* 65 - 99 mg/dL Final  . BUN 05/27/2016 14  6 - 20 mg/dL Final  . Creatinine, Ser 05/27/2016 0.79  0.44 - 1.00 mg/dL Final  . Calcium 05/27/2016 9.3  8.9 -  10.3 mg/dL Final  . Total Protein 05/27/2016 6.7  6.5 - 8.1 g/dL Final  . Albumin 05/27/2016 2.7* 3.5 - 5.0 g/dL Final  . AST 05/27/2016 29  15 - 41 U/L Final  . ALT 05/27/2016 13* 14 - 54 U/L Final  . Alkaline Phosphatase 05/27/2016 82  38 - 126 U/L Final  . Total Bilirubin 05/27/2016 0.7  0.3 - 1.2 mg/dL Final  . GFR calc non Af Amer 05/27/2016 >60  >60 mL/min Final  . GFR calc Af Amer 05/27/2016 >60  >60 mL/min Final   Comment: (NOTE) The eGFR has been calculated using the CKD EPI equation. This calculation has not been validated in all clinical situations. eGFR's persistently <60 mL/min signify possible Chronic Kidney Disease.   . Anion gap 05/27/2016 11  5 - 15 Final  . WBC 05/27/2016 8.9  4.0 - 10.5 K/uL Final  . RBC 05/27/2016  3.24* 3.87 - 5.11 MIL/uL Final  . Hemoglobin 05/27/2016 9.4* 12.0 - 15.0 g/dL Final  . HCT 05/27/2016 28.4* 36.0 - 46.0 % Final  . MCV 05/27/2016 87.7  78.0 - 100.0 fL Final  . MCH 05/27/2016 29.0  26.0 - 34.0 pg Final  . MCHC 05/27/2016 33.1  30.0 - 36.0 g/dL Final  . RDW 05/27/2016 16.8* 11.5 - 15.5 % Final  . Platelets 05/27/2016 458* 150 - 400 K/uL Final  . Neutrophils Relative % 05/27/2016 78  % Final  . Neutro Abs 05/27/2016 7.1  1.7 - 7.7 K/uL Final  . Lymphocytes Relative 05/27/2016 9  % Final  . Lymphs Abs 05/27/2016 0.8  0.7 - 4.0 K/uL Final  . Monocytes Relative 05/27/2016 10  % Final  . Monocytes Absolute 05/27/2016 0.9  0.1 - 1.0 K/uL Final  . Eosinophils Relative 05/27/2016 2  % Final  . Eosinophils Absolute 05/27/2016 0.2  0.0 - 0.7 K/uL Final  . Basophils Relative 05/27/2016 1  % Final  . Basophils Absolute 05/27/2016 0.1  0.0 - 0.1 K/uL Final  . Lactic Acid, Venous 05/27/2016 1.59  0.5 - 1.9 mmol/L Final  . Color, Urine 05/27/2016 YELLOW  YELLOW Final  . APPearance 05/27/2016 HAZY* CLEAR Final  . Specific Gravity, Urine 05/27/2016 1.019  1.005 - 1.030 Final  . pH 05/27/2016 7.0  5.0 - 8.0 Final  . Glucose, UA 05/27/2016 NEGATIVE   NEGATIVE mg/dL Final  . Hgb urine dipstick 05/27/2016 NEGATIVE  NEGATIVE Final  . Bilirubin Urine 05/27/2016 NEGATIVE  NEGATIVE Final  . Ketones, ur 05/27/2016 NEGATIVE  NEGATIVE mg/dL Final  . Protein, ur 05/27/2016 30* NEGATIVE mg/dL Final  . Nitrite 05/27/2016 NEGATIVE  NEGATIVE Final  . Leukocytes, UA 05/27/2016 TRACE* NEGATIVE Final  . RBC / HPF 05/27/2016 0-5  0 - 5 RBC/hpf Final  . WBC, UA 05/27/2016 6-30  0 - 5 WBC/hpf Final  . Bacteria, UA 05/27/2016 RARE* NONE SEEN Final  . Squamous Epithelial / LPF 05/27/2016 0-5* NONE SEEN Final  . Mucous 05/27/2016 PRESENT   Final  . Hyaline Casts, UA 05/27/2016 PRESENT   Final  Office Visit on 05/26/2016  Component Date Value Ref Range Status  . Sodium 05/26/2016 138  135 - 145 mEq/L Final  . Potassium 05/26/2016 3.7  3.5 - 5.1 mEq/L Final  . Chloride 05/26/2016 101  96 - 112 mEq/L Final  . CO2 05/26/2016 26  19 - 32 mEq/L Final  . Glucose, Bld 05/26/2016 144* 70 - 99 mg/dL Final  . BUN 05/26/2016 12  6 - 23 mg/dL Final  . Creatinine, Ser 05/26/2016 0.69  0.40 - 1.20 mg/dL Final  . Total Bilirubin 05/26/2016 0.4  0.2 - 1.2 mg/dL Final  . Alkaline Phosphatase 05/26/2016 83  39 - 117 U/L Final  . AST 05/26/2016 21  0 - 37 U/L Final  . ALT 05/26/2016 10  0 - 35 U/L Final  . Total Protein 05/26/2016 6.1  6.0 - 8.3 g/dL Final  . Albumin 05/26/2016 2.8* 3.5 - 5.2 g/dL Final  . Calcium 05/26/2016 9.4  8.4 - 10.5 mg/dL Final  . GFR 05/26/2016 102.64  >60.00 mL/min Final  . WBC 05/26/2016 7.4  4.0 - 10.5 K/uL Final  . RBC 05/26/2016 3.20* 3.87 - 5.11 Mil/uL Final  . Hemoglobin 05/26/2016 9.2* 12.0 - 15.0 g/dL Final  . HCT 05/26/2016 28.8* 36.0 - 46.0 % Final  . MCV 05/26/2016 90.1  78.0 - 100.0 fl Final  . MCHC 05/26/2016 32.0  30.0 - 36.0 g/dL Final  . RDW 05/26/2016 17.7* 11.5 - 15.5 % Final  . Platelets 05/26/2016 468.0* 150.0 - 400.0 K/uL Final  . Neutrophils Relative % 05/26/2016 73.6  43.0 - 77.0 % Final  . Lymphocytes Relative  05/26/2016 12.3  12.0 - 46.0 % Final  . Monocytes Relative 05/26/2016 9.5  3.0 - 12.0 % Final  . Eosinophils Relative 05/26/2016 3.5  0.0 - 5.0 % Final  . Basophils Relative 05/26/2016 1.1  0.0 - 3.0 % Final  . Neutro Abs 05/26/2016 5.4  1.4 - 7.7 K/uL Final  . Lymphs Abs 05/26/2016 0.9  0.7 - 4.0 K/uL Final  . Monocytes Absolute 05/26/2016 0.7  0.1 - 1.0 K/uL Final  . Eosinophils Absolute 05/26/2016 0.3  0.0 - 0.7 K/uL Final  . Basophils Absolute 05/26/2016 0.1  0.0 - 0.1 K/uL Final  There may be more visits with results that are not included.    No results found.   Assessment/Plan   ICD-10-CM   1. Restlessness and agitation - uncontrolled R45.1   2. Generalized anxiety disorder F41.1   3. Failure to thrive in adult R62.7   4. Cognitive impairment - likely cause of #1 R41.89   5. Decubitus ulcer of heel, bilateral, stage 4 ([place]) L89.614    L89.624   6. Stage IV pressure ulcer of sacral region [facility]) L89.154     Discussed with daughter at length at bedside pt's current medical condition. She is on special mattress due to multiple pressure sores. She is followed by wound care closely. She has been seen either by myself or the NP at least 5 times since her admission into SNF at the end of May 2018. Daughter stated she plans to speak with other family members to determine if she needs other changes or if new concerns arise in regards to her mother.  D/c current klonopin order  Start klonopin 0.26m TID for agitation/anxiety - hold for sedation  Turn pt q2 hrs to reduce/prevent pressure sores  Leave lunch tray at bedside per family request as they will feed pt her lunch meal  Cont wound care as ordered. Wound care provider to follow  Cont other meds as ordered  Communicated with Nursing and family today.  Will follow  TIME SPENT: 30 minutes with >50% of time coordinating care and educating family member  MPaynes CreekS. CPerlie Gold [facility] [address] [address]([phone] (Monday-Friday 8 AM - 5 PM) ([phone] 5 PM and follow prompts

## 2016-08-25 ENCOUNTER — Encounter: Payer: Self-pay | Admitting: Adult Health

## 2016-08-25 ENCOUNTER — Non-Acute Institutional Stay (SKILLED_NURSING_FACILITY): Payer: Medicare Other | Admitting: Adult Health

## 2016-08-25 DIAGNOSIS — L89154 Pressure ulcer of sacral region, stage 4: Secondary | ICD-10-CM

## 2016-08-25 DIAGNOSIS — F411 Generalized anxiety disorder: Secondary | ICD-10-CM

## 2016-08-25 DIAGNOSIS — R627 Adult failure to thrive: Secondary | ICD-10-CM | POA: Diagnosis not present

## 2016-08-25 DIAGNOSIS — M7989 Other specified soft tissue disorders: Secondary | ICD-10-CM | POA: Diagnosis not present

## 2016-08-25 DIAGNOSIS — L89614 Pressure ulcer of right heel, stage 4: Secondary | ICD-10-CM | POA: Diagnosis not present

## 2016-08-25 NOTE — Progress Notes (Signed)
Location:   [facility] Room Number: 113 B Place of Service:  SNF (31)   CODE STATUS: DNR  Allergies  Allergen Reactions  . Dicyclomine Other (See Comments)    Reaction:  Dizziness   . Gabapentin Other (See Comments)    Reaction:  Dizziness   . Metformin And Related Nausea And Vomiting  . Lyrica [Pregabalin] Other (See Comments)    Reaction:  Unknown  . Topiramate Other (See Comments)    Reaction:  Unknown    Chief Complaint  Patient presents with  . Acute Visit    Left hand and wrist swelling    HPI:  Her family has noted that her left hand and wrist area are swollen. There are no signs of injury present; there is no bruising; warmth or redness present. Her left hand and fingers are slightly swollen. She does tell me that her hands hurt; she is unable to fully participate in the hpi or ros.   Past Medical History:  Diagnosis Date  . Anxiety disorder   . Arthritis   . Asthma   . Chronic kidney disease   . Degenerative disk disease   . Diabetes mellitus   . GERD (gastroesophageal reflux disease)   . Hypercholesteremia   . Hypertension   . IBS (irritable bowel syndrome)   . Sarcoidosis   . Sepsis (HCC) 06/29/2016  . Spinal stenosis     Past Surgical History:  Procedure Laterality Date  . EYE SURGERY  2017   cataract / glaucoma    Social History   Social History  . Marital status: Divorced    Spouse name: N/A  . Number of children: N/A  . Years of education: N/A   Occupational History  . Not on file.   Social History Main Topics  . Smoking status: Former Games developer  . Smokeless tobacco: Never Used  . Alcohol use No  . Drug use: No  . Sexual activity: Not on file   Other Topics Concern  . Not on file   Social History Narrative  . No narrative on file   Family History  Problem Relation Age of Onset  . Hypertension Other       VITAL SIGNS BP (!) 97/57   Pulse 99   Temp (!) 97.2 F (36.2 C)   Resp 18   Ht 5\' 4"  (1.626 m)    Wt 118 lb 4.8 oz (53.7 kg)   SpO2 97%   BMI 20.31 kg/m   Patient's Medications  New Prescriptions   No medications on file  Previous Medications   ACETAMINOPHEN (TYLENOL) 500 MG TABLET    Take 2 tablets every 8 hours as needed for pain    ALBUTEROL (PROVENTIL HFA;VENTOLIN HFA) 108 (90 BASE) MCG/ACT INHALER    Inhale 2 puffs into the lungs every 6 (six) hours as needed for wheezing or shortness of breath.    ALBUTEROL (PROVENTIL) (2.5 MG/3ML) 0.083% NEBULIZER SOLUTION    Take 2.5 mg by nebulization every 6 (six) hours as needed for wheezing or shortness of breath.   AMINO ACIDS-PROTEIN HYDROLYS (FEEDING SUPPLEMENT, PRO-STAT SUGAR FREE 64,) LIQD    Take 30 mLs by mouth 3 (three) times daily with meals.   ASPIRIN EC 81 MG TABLET    Take 81 mg by mouth daily.   BISACODYL (DULCOLAX) 10 MG SUPPOSITORY    Place 1 suppository (10 mg total) rectally daily as needed for moderate constipation.   CLONAZEPAM (KLONOPIN) 0.5 MG TABLET    Give  0.25 mg by mouth three times daily for agitation.  Hold for Sedation   DICLOFENAC SODIUM (VOLTAREN) 1 % GEL    APPLY 2 GRAMS EXTERNALLY TO THE AFFECTED AREA FOUR TIMES DAILY AS NEEDED FOR PAIN   MENTHOL, TOPICAL ANALGESIC, (BIOFREEZE) 4 % GEL    Apply topically. Apply to lower back topically every shift for back pain   MULTIPLE VITAMINS-MINERALS (DECUBI-VITE) CAPS    Take 1 capsule by mouth daily.   NUTRITIONAL SUPPLEMENT LIQD    House Supplement - Med Pass 120cc by mouth three times daily   OMEPRAZOLE (PRILOSEC) 40 MG CAPSULE    Take 40 mg by mouth daily.    OSTOMY SUPPLIES (SKIN PREP SPRAY) MISC    Apply to toes topically two times a day for Blackened toes   TRAMADOL (ULTRAM) 50 MG TABLET    Give 0.5 tablet by mouth three times a day for Pain Management to equal 25 mg   UNABLE TO FIND    HSG Regular Diet - HSG Puree texture, Regular consistency  Modified Medications   No medications on file  Discontinued Medications   NUTRITIONAL SUPPLEMENTS (NUTRITIONAL  SUPPLEMENT PO)    House Supplement - Nutritional Treat with meals daily     SIGNIFICANT DIAGNOSTIC EXAMS   06-29-16:right shoulder x-ray: Right shoulder arthropathy without acute process Osteopenia  06-29-16: chest x-ray: Chronic scarring in the apices bilaterally.  06-29-16: kub: Unremarkable bowel gas pattern; no free intra-abdominal air seen. Moderate to large amount of stool noted in the colon.  07-16-16: right lower extremity arterial doppler : no doppler evidence of stenosis limited exam    LABS REVIEWED:   05-16-16: hgb a1c 6.6 06-29-16: wbc 7.0; hgb 10.3; hct 31.7; mcv 89.0; plt 272; glucose 112; bun 21; creat 0.79; k+ 3.9; na++ 139; ca 9.4; liver normal albumin 3.0 blood culture: no growth; urine culture: multiple bacteria 06-30-16: wbc 13.6; hgb 10.3; hct 32.7; mcv 91.6; plt 218; glucose 104; bun 22; creat 0.83; k+ 3.7; na++ 142;ca 8.7; liver normal albumin 2.7; mag 1.6 07-01-16: wbc 10.7; hgb 9.2; hct 27.4; mcv 89.5; plt 223; glucose 204; bun 24; creat 0.83; k+ 3.7; na++ 139; mag 2.3  07-09-16: wbc 6.4; hgb 10.7; hct 35.1; mcv 95.6; plt 271; glucose 137; bun 17.1; creat 0.92; k+ 4.9; na++ 141; ca 9.9; liver normal albumin 3.1  07-22-16: glucose 110; bun 29.7; creat 0.74; k+ 5.3; na++ 144; ca 10.2  08-06-16: glucose 178; bun 22.2; creat 0.6; k+ 4.5; na++ 135; ca 9.3     Review of Systems  Unable to perform ROS: Other (cognitive impairment )      Physical Exam  Constitutional: No distress.  Frail   Eyes: Conjunctivae are normal.  Neck: Neck supple. No JVD present. No thyromegaly present.  Cardiovascular: Normal rate, regular rhythm and intact distal pulses are faint .   Murmur heard. Respiratory: Effort normal and breath sounds normal. No respiratory distress. She has no wheezes.  GI: Soft. Bowel sounds are normal. She exhibits no distension. There is no tenderness.  Genitourinary:  Genitourinary Comments: Foley   Musculoskeletal: She exhibits no edema.  Left hand and  fingers are slightly swollen without any sign of injury present. No redness; warmth or heat present.  Hands with equal grips strength 4/5 Left posterior heel stage IV: 3.1 x 4.5 x 0.3 cm: santyl  Right posterior heel stage IV: 1.8 x 4.3 x 0.3 cm calcium alginate Right sacrum stage IV:   2.8 x 1.8 x 0.5 cm wet  to dry Coccyx stage III: 2.4 x 1.5 x 0.1 cm medihoney Lymphadenopathy:    She has no cervical adenopathy.  Neurological: She is alert.    ASSESSMENT/ PLAN:  1. Anxiety disorder: her klonopin has been changed back to 0.25 mg three times daily and will hold for sedation.   2. Multiple ulcerations: will continue current plan of care; is followed by wound dr.   3. Failure to thrive: albumin 3.1 her weight is 118 pounds. Will continue supplements as directed and will monitor  4. Left hand swelling: more than likely a gravity effect; will continue to monitor and will monitor her status.   MD is aware of resident's narcotic use and is in agreement with current plan of care. We will attempt to wean resident as apropriate   Synthia Innocenteborah Kaylyne Axton NP [facility]  Contact [phone] Monday through Friday 8am- 5pm  After hours call [phone]

## 2016-08-29 ENCOUNTER — Emergency Department (HOSPITAL_COMMUNITY): Payer: Medicare Other

## 2016-08-29 ENCOUNTER — Encounter (HOSPITAL_COMMUNITY): Payer: Self-pay | Admitting: Emergency Medicine

## 2016-08-29 ENCOUNTER — Emergency Department (HOSPITAL_COMMUNITY)
Admission: EM | Admit: 2016-08-29 | Discharge: 2016-08-29 | Disposition: A | Discharge status: Home / Self Care | Payer: Medicare Other | Attending: Emergency Medicine | Admitting: Emergency Medicine

## 2016-08-29 DIAGNOSIS — Z79899 Other long term (current) drug therapy: Secondary | ICD-10-CM | POA: Diagnosis not present

## 2016-08-29 DIAGNOSIS — X58XXXA Exposure to other specified factors, initial encounter: Secondary | ICD-10-CM | POA: Insufficient documentation

## 2016-08-29 DIAGNOSIS — J45909 Unspecified asthma, uncomplicated: Secondary | ICD-10-CM | POA: Insufficient documentation

## 2016-08-29 DIAGNOSIS — Z87891 Personal history of nicotine dependence: Secondary | ICD-10-CM | POA: Insufficient documentation

## 2016-08-29 DIAGNOSIS — Y929 Unspecified place or not applicable: Secondary | ICD-10-CM | POA: Insufficient documentation

## 2016-08-29 DIAGNOSIS — E1122 Type 2 diabetes mellitus with diabetic chronic kidney disease: Secondary | ICD-10-CM | POA: Insufficient documentation

## 2016-08-29 DIAGNOSIS — S62001A Unspecified fracture of navicular [scaphoid] bone of right wrist, initial encounter for closed fracture: Secondary | ICD-10-CM

## 2016-08-29 DIAGNOSIS — N183 Chronic kidney disease, stage 3 (moderate): Secondary | ICD-10-CM | POA: Insufficient documentation

## 2016-08-29 DIAGNOSIS — S62014A Nondisplaced fracture of distal pole of navicular [scaphoid] bone of right wrist, initial encounter for closed fracture: Secondary | ICD-10-CM | POA: Diagnosis not present

## 2016-08-29 DIAGNOSIS — Y939 Activity, unspecified: Secondary | ICD-10-CM | POA: Insufficient documentation

## 2016-08-29 DIAGNOSIS — S6991XA Unspecified injury of right wrist, hand and finger(s), initial encounter: Secondary | ICD-10-CM | POA: Diagnosis not present

## 2016-08-29 DIAGNOSIS — Y999 Unspecified external cause status: Secondary | ICD-10-CM | POA: Insufficient documentation

## 2016-08-29 DIAGNOSIS — Z7982 Long term (current) use of aspirin: Secondary | ICD-10-CM | POA: Diagnosis not present

## 2016-08-29 LAB — CBC AND DIFFERENTIAL
HCT: 33 — AB (ref 36–46)
HEMOGLOBIN: 10.4 — AB (ref 12.0–16.0)
Neutrophils Absolute: 7
PLATELETS: 339 (ref 150–399)
WBC: 9.2

## 2016-08-29 NOTE — ED Triage Notes (Signed)
Patient come by EMS. [facility] is [facility] [address]. Patient wrist is fractured per EMS from the facility and the facility does not know how patient wrist was fractured. Patient was sent here for a second opinion. Right wrist has some swelling. Patient has a foley catheter in.

## 2016-08-29 NOTE — ED Notes (Signed)
Bed: [id number] Expected date:  Expected time:  Means of arrival:  Comments: 89f xrays for possible fx wrist

## 2016-08-29 NOTE — Discharge Instructions (Signed)
Please read attached information. If you experience any new or worsening signs or symptoms please return to the emergency room for evaluation. Please follow-up with your primary care provider or specialist as discussed.  °

## 2016-08-29 NOTE — ED Notes (Signed)
Patient's daughter at bedside. Patient turned to her right side. Patient has bed sores.

## 2016-08-29 NOTE — ED Notes (Signed)
Called facility. Ulice Boldnga Rn was notified and gave report.

## 2016-08-29 NOTE — ED Provider Notes (Signed)
[facility]-EMERGENCY DEPT Provider Note   CSN: [id number] Arrival date & time: 08/29/16  1951     History   Chief Complaint Chief Complaint  Patient presents with  . Wrist Injury    HPI Elizabeth Keller is a 83 y.o. female.  HPI   83 year old female presents today with her daughter who is power of attorney for second opinion of hand pain.  Daughter notes that her mother has dementia and is unable to provide any significant details.  She does note that recently she has had bilateral hand pain.  She notes that she has had mild swelling in the hands.  Patient is nonambulatory with no function of her upper extremities. Pt was seen by nursing facility for questionable scaphoid fracture.   Past Medical History:  Diagnosis Date  . Anxiety disorder   . Arthritis   . Asthma   . Chronic kidney disease   . Degenerative disk disease   . Diabetes mellitus   . GERD (gastroesophageal reflux disease)   . Hypercholesteremia   . Hypertension   . IBS (irritable bowel syndrome)   . Sarcoidosis   . Sepsis (HCC) 06/29/2016  . Spinal stenosis     Patient Active Problem List   Diagnosis Date Noted  . Cognitive impairment 08/14/2016  . Anemia, chronic disease 08/14/2016  . GERD (gastroesophageal reflux disease) 08/13/2016  . Stage IV pressure ulcer of sacral region (HCC) 07/08/2016  . Decubitus ulcer of heel, bilateral, unstageable (HCC) 07/08/2016  . Dyslipidemia associated with type 2 diabetes mellitus (HCC) 07/08/2016  . Failure to thrive in adult 07/08/2016  . Weight loss, non-intentional 07/08/2016  . Anxiety disorder 07/08/2016  . Severe protein-calorie malnutrition (HCC) 06/30/2016  . Infective urethritis   . Sepsis (HCC) 06/29/2016  . Severe sepsis (HCC) 05/15/2016  . Pressure ulcer of unspecified buttock, stage 2 04/04/2016  . Type II diabetes mellitus with renal manifestations (HCC) 04/04/2016  . Chronic kidney disease, stage III (moderate) 03/14/2016    Past Surgical History:    Procedure Laterality Date  . EYE SURGERY  2017   cataract / glaucoma    OB History    No data available       Home Medications    Prior to Admission medications   Medication Sig Start Date End Date Taking? Authorizing Provider  acetaminophen (TYLENOL) 500 MG tablet Take 2 tablets every 8 hours as needed for pain     [provider]  albuterol (PROVENTIL HFA;VENTOLIN HFA) 108 (90 Base) MCG/ACT inhaler Inhale 2 puffs into the lungs every 6 (six) hours as needed for wheezing or shortness of breath.     [provider]  albuterol (PROVENTIL) (2.5 MG/3ML) 0.083% nebulizer solution Take 2.5 mg by nebulization every 6 (six) hours as needed for wheezing or shortness of breath.    [provider]  Amino Acids-Protein Hydrolys (FEEDING SUPPLEMENT, PRO-STAT SUGAR FREE 64,) LIQD Take 30 mLs by mouth 3 (three) times daily with meals.    [provider]  aspirin EC 81 MG tablet Take 81 mg by mouth daily.    [provider]  bisacodyl (DULCOLAX) 10 MG suppository Place 1 suppository (10 mg total) rectally daily as needed for moderate constipation. 05/20/16   Sheikh, Kateri Mc Latif, DO  clonazePAM (KLONOPIN) 0.5 MG tablet Give 0.25 mg by mouth three times daily for agitation.  Hold for Sedation    [provider]  diclofenac sodium (VOLTAREN) 1 % GEL APPLY 2 GRAMS EXTERNALLY TO THE AFFECTED AREA FOUR TIMES  DAILY AS NEEDED FOR PAIN 06/26/16   Sandford Craze, NP  Menthol, Topical Analgesic, (BIOFREEZE) 4 % GEL Apply topically. Apply to lower back topically every shift for back pain    [provider]  Multiple Vitamins-Minerals (DECUBI-VITE) CAPS Take 1 capsule by mouth daily.    [provider]  NUTRITIONAL SUPPLEMENT LIQD House Supplement - Med Pass 120cc by mouth three times daily    [provider]  omeprazole (PRILOSEC) 40 MG capsule Take 40 mg by mouth daily.     [provider]  Ostomy Supplies (SKIN PREP  SPRAY) MISC Apply to toes topically two times a day for Blackened toes    [provider]  traMADol (ULTRAM) 50 MG tablet Give 0.5 tablet by mouth three times a day for Pain Management to equal 25 mg    [provider]  UNABLE TO FIND HSG Regular Diet - HSG Puree texture, Regular consistency    [provider]    Family History Family History  Problem Relation Age of Onset  . Hypertension Other     Social History Social History  Substance Use Topics  . Smoking status: Former Games developer  . Smokeless tobacco: Never Used  . Alcohol use No     Allergies   Dicyclomine; Gabapentin; Metformin and related; Lyrica [pregabalin]; and Topiramate   Review of Systems Review of Systems  All other systems reviewed and are negative.    Physical Exam Updated Vital Signs BP 123/62 (BP Location: Left Arm)   Pulse 79   Temp 98.5 F (36.9 C)   Resp 18   Ht 5\' 4"  (1.626 m)   Wt 53.5 kg (118 lb)   SpO2 92%   BMI 20.25 kg/m   Physical Exam  Constitutional: She is oriented to person, place, and time. She appears well-developed and well-nourished.  HENT:  Head: Normocephalic and atraumatic.  Eyes: Pupils are equal, round, and reactive to light. Conjunctivae are normal. Right eye exhibits no discharge. Left eye exhibits no discharge. No scleral icterus.  Neck: Normal range of motion. No JVD present. No tracheal deviation present.  Pulmonary/Chest: Effort normal. No stridor.  Musculoskeletal:  Unable to perform complete exam as patient would not allow it; wrist atraumatic with no signs of infection   Neurological: She is alert and oriented to person, place, and time. Coordination normal.  Psychiatric: She has a normal mood and affect. Her behavior is normal. Judgment and thought content normal.  Nursing note and vitals reviewed.    ED Treatments / Results  Labs (all labs ordered are listed, but only abnormal results are displayed) Labs Reviewed - No data to  display  EKG  EKG Interpretation None       Radiology Dg Wrist Complete Right  Result Date: 08/29/2016 CLINICAL DATA:  Wrist injury EXAM: RIGHT WRIST - COMPLETE 3+ VIEW COMPARISON:  None. FINDINGS: Advanced degenerative changes at the first [facility] joint. No dislocation. TFCC calcification. Possible lucency mid to distal aspect of the scaphoid bone. IMPRESSION: 1. Possible lucency/nondisplaced fracture involving the mid distal scaphoid 2. Degenerative changes of the wrist. Electronically Signed   By: Jasmine Pang M.D.   On: 08/29/2016 20:27    Procedures Procedures (including critical care time)  Medications Ordered in ED Medications - No data to display   Initial Impression / Assessment and Plan / ED Course  I have reviewed the triage vital signs and the nursing notes.  Pertinent labs & imaging results that were available during my care of  the patient were reviewed by me and considered in my medical decision making (see chart for details).      Final Clinical Impressions(s) / ED Diagnoses   Final diagnoses:  Closed nondisplaced fracture of scaphoid of right wrist, unspecified portion of scaphoid, initial encounter    Patient with questionable scaphoid fracture.  No obvious signs of trauma.  Patient will have follow-up x-ray for repeat imaging and outpatient follow-up.  Patient discharged with daughter.  Daughter requesting case management consult for other arrangements for her mother's care.  New Prescriptions New Prescriptions   No medications on file     Rosalio LoudHedges, Shauntelle Jamerson, PA-C 08/29/16 2227    Mancel BaleWentz, Elliott, MD 08/29/16 2329

## 2016-08-29 NOTE — ED Notes (Signed)
Patient went to xray 

## 2016-08-31 ENCOUNTER — Encounter: Payer: Self-pay | Admitting: Adult Health

## 2016-08-31 ENCOUNTER — Non-Acute Institutional Stay (SKILLED_NURSING_FACILITY): Payer: Medicare Other | Admitting: Adult Health

## 2016-08-31 DIAGNOSIS — E43 Unspecified severe protein-calorie malnutrition: Secondary | ICD-10-CM

## 2016-08-31 DIAGNOSIS — D638 Anemia in other chronic diseases classified elsewhere: Secondary | ICD-10-CM | POA: Diagnosis not present

## 2016-08-31 DIAGNOSIS — R634 Abnormal weight loss: Secondary | ICD-10-CM | POA: Diagnosis not present

## 2016-08-31 DIAGNOSIS — M7989 Other specified soft tissue disorders: Secondary | ICD-10-CM | POA: Insufficient documentation

## 2016-08-31 DIAGNOSIS — K219 Gastro-esophageal reflux disease without esophagitis: Secondary | ICD-10-CM

## 2016-08-31 DIAGNOSIS — R627 Adult failure to thrive: Secondary | ICD-10-CM | POA: Diagnosis not present

## 2016-08-31 DIAGNOSIS — L89154 Pressure ulcer of sacral region, stage 4: Secondary | ICD-10-CM

## 2016-08-31 DIAGNOSIS — N183 Chronic kidney disease, stage 3 unspecified: Secondary | ICD-10-CM

## 2016-08-31 NOTE — Progress Notes (Addendum)
Location:   [facility] Room Number: 113 B Place of Service:  SNF (31)   CODE STATUS: DNR  Allergies  Allergen Reactions  . Dicyclomine Other (See Comments)    Reaction:  Dizziness   . Gabapentin Other (See Comments)    Reaction:  Dizziness   . Metformin And Related Nausea And Vomiting  . Lyrica [Pregabalin] Other (See Comments)    Reaction:  Unknown  . Topiramate Other (See Comments)    Reaction:  Unknown    Chief Complaint  Patient presents with  . Medical Management of Chronic Issues    1 month follow up    HPI:  She is a 83 year old long term resident of this facility being seen for the management of her chronic illnesses. She was in the ED  On 08-29-16 for a possible right wrist fracture. Her hand is in a splint and ace wrap. She did tell me that her wrist feels better; but does have back pain. I spoke with her son regarding her status. Her uric acid level is slightly elevated; having diabetes can cause an increase in uric acid. We discussed her wrist injury. More than likely this has been caused over the past week or so; from her wrist being moved by either staff; therapy, medical providers or family. He did verbalize understanding.    Past Medical History:  Diagnosis Date  . Anxiety disorder   . Arthritis   . Asthma   . Chronic kidney disease   . Degenerative disk disease   . Diabetes mellitus   . GERD (gastroesophageal reflux disease)   . Hypercholesteremia   . Hypertension   . IBS (irritable bowel syndrome)   . Sarcoidosis   . Sepsis (HCC) 06/29/2016  . Spinal stenosis     Past Surgical History:  Procedure Laterality Date  . EYE SURGERY  2017   cataract / glaucoma    Social History   Social History  . Marital status: Divorced    Spouse name: N/A  . Number of children: N/A  . Years of education: N/A   Occupational History  . Not on file.   Social History Main Topics  . Smoking status: Former Games developermoker  . Smokeless tobacco: Never Used   . Alcohol use No  . Drug use: No  . Sexual activity: Not on file   Other Topics Concern  . Not on file   Social History Narrative  . No narrative on file   Family History  Problem Relation Age of Onset  . Hypertension Other       VITAL SIGNS BP 98/60   Pulse 85   Temp 97.6 F (36.4 C)   Resp 18   Ht 5\' 4"  (1.626 m)   Wt 115 lb 14.4 oz (52.6 kg)   SpO2 97%   BMI 19.89 kg/m   Patient's Medications  New Prescriptions   No medications on file  Previous Medications   ACETAMINOPHEN (TYLENOL) 500 MG TABLET    Take 2 tablets every 8 hours as needed for pain    ALBUTEROL (PROVENTIL HFA;VENTOLIN HFA) 108 (90 BASE) MCG/ACT INHALER    Inhale 2 puffs into the lungs every 6 (six) hours as needed for wheezing or shortness of breath.    ALBUTEROL (PROVENTIL) (2.5 MG/3ML) 0.083% NEBULIZER SOLUTION    Take 2.5 mg by nebulization every 6 (six) hours as needed for wheezing or shortness of breath.   AMINO ACIDS-PROTEIN HYDROLYS (FEEDING SUPPLEMENT, PRO-STAT SUGAR FREE 64,) LIQD  Take 30 mLs by mouth 3 (three) times daily with meals.   ASPIRIN EC 81 MG TABLET    Take 81 mg by mouth daily.   BISACODYL (DULCOLAX) 10 MG SUPPOSITORY    Place 1 suppository (10 mg total) rectally daily as needed for moderate constipation.   CLONAZEPAM (KLONOPIN) 0.5 MG TABLET    Give 0.25 mg by mouth three times daily for agitation.  Hold for Sedation   DICLOFENAC SODIUM (VOLTAREN) 1 % GEL    APPLY 2 GRAMS EXTERNALLY TO THE AFFECTED AREA FOUR TIMES DAILY AS NEEDED FOR PAIN   MENTHOL, TOPICAL ANALGESIC, (BIOFREEZE) 4 % GEL    Apply topically. Apply to lower back topically every shift for back pain.  Apply to bilateral shoulders topically every day and night shift for pain management   MULTIPLE VITAMINS-MINERALS (DECUBI-VITE) CAPS    Take 1 capsule by mouth daily.   NUTRITIONAL SUPPLEMENT LIQD    House Supplement - Med Pass 120cc by mouth three times daily   NUTRITIONAL SUPPLEMENTS (NUTRITIONAL SUPPLEMENT PO)     House Supplement - Give Nutritional Treat by mouth with meals daily   OMEPRAZOLE (PRILOSEC) 40 MG CAPSULE    Take 40 mg by mouth daily.    OSTOMY SUPPLIES (SKIN PREP SPRAY) MISC    Apply to toes topically two times a day for Blackened toes   TRAMADOL (ULTRAM) 50 MG TABLET    Give 0.5 tablet by mouth three times a day for Pain Management to equal 25 mg   UNABLE TO FIND    HSG Regular Diet - HSG Puree texture, Regular consistency  Modified Medications   No medications on file  Discontinued Medications   No medications on file     SIGNIFICANT DIAGNOSTIC EXAMS  06-29-16:right shoulder x-ray: Right shoulder arthropathy without acute process Osteopenia  06-29-16: chest x-ray: Chronic scarring in the apices bilaterally.  06-29-16: kub: Unremarkable bowel gas pattern; no free intra-abdominal air seen. Moderate to large amount of stool noted in the colon.  07-16-16: right lower extremity arterial doppler : no doppler evidence of stenosis limited exam  08-28-16: right wrist x-ray: possible nondisplaced scaphoid fracture   08-29-16: right wrist x-ray: (ED) 1. Possible lucency/nondisplaced fracture involving the mid distalscaphoid 2. Degenerative changes of the wrist.   08-30-16: left wrist x-ray: no acute bone or joint abnormality is seen in the wrist are. Osteoarthritis first carpometacarpal oint. There is a suggestion of tissue swelling in the proximal forearm or elbow region.    LABS REVIEWED:   05-16-16: hgb a1c 6.6 06-29-16: wbc 7.0; hgb 10.3; hct 31.7; mcv 89.0; plt 272; glucose 112; bun 21; creat 0.79; k+ 3.9; na++ 139; ca 9.4; liver normal albumin 3.0 blood culture: no growth; urine culture: multiple bacteria 06-30-16: wbc 13.6; hgb 10.3; hct 32.7; mcv 91.6; plt 218; glucose 104; bun 22; creat 0.83; k+ 3.7; na++ 142;ca 8.7; liver normal albumin 2.7; mag 1.6 07-01-16: wbc 10.7; hgb 9.2; hct 27.4; mcv 89.5; plt 223; glucose 204; bun 24; creat 0.83; k+ 3.7; na++ 139; mag 2.3  07-09-16: wbc 6.4; hgb  10.7; hct 35.1; mcv 95.6; plt 271; glucose 137; bun 17.1; creat 0.92; k+ 4.9; na++ 141; ca 9.9; liver normal albumin 3.1  07-22-16: glucose 110; bun 29.7; creat 0.74; k+ 5.3; na++ 144; ca 10.2  08-06-16: glucose 178; bun 22.2; creat 0.6; k+ 4.5; na++ 135; ca 9.3  08-29-16: wbc 9.2; hgb 10.4; hct 33.2; mcv 90.8; plt 339; uric acid 6.0     Review of  Systems  Unable to perform ROS: Other (cognitive impairment )      Physical Exam  Constitutional: No distress.  Frail   Eyes: Conjunctivae are normal.  Neck: Neck supple. No JVD present. No thyromegaly present.  Cardiovascular: Normal rate, regular rhythm and intact distal pulses are faint .   Murmur heard. Respiratory: Effort normal and breath sounds normal. No respiratory distress. She has no wheezes.  GI: Soft. Bowel sounds are normal. She exhibits no distension. There is no tenderness.  Genitourinary:  Genitourinary Comments: Foley   Musculoskeletal: She exhibits no edema.  Right wrist is in splint and ace wrap rapid capillary refill; is able to move fingers.  Hands with equal grips strength 4/5 Left posterior heel stage IV: 3.1 x 4.5 x 0.3 cm: santyl  Right posterior heel stage IV: 1.8 x 4.3 x 0.3 cm calcium alginate Right sacrum stage IV:   2.8 x 1.8 x 0.5 cm wet to dry Coccyx stage III: 2.4 x 1.5 x 0.1 cm medihoney Lymphadenopathy:    She has no cervical adenopathy.  Neurological: She is alert.     ASSESSMENT/ PLAN:   1. CKD stage III: bun 22.2/creat 0.6  2. [place]: hgb 10.4; will monitor   3. DJD has spinal stenosis; degenerative disc disease and chronic pain  : no indications of pain present; has voltaren gel four times daily as needed is using biofreeze to back and bilateral shoulders twice daily    Will change her ultram to 25 mg every 6 hours to help manage her pain   4. CHF: EF 55-60% (03-17-16): is presently off all medications will monitor   5. FTT/ weight loss protein calorie malnutrition: her weight on 06-03-16 was  135; pounds: her current weight is 118 pounds. Her albumin is 2.7 will continue supplements per facility protocol  She has completed megace and remeron therapy.   6. Anxiety: will continue klonopin 0.25 mg three times daily and will monitor her status.   7. Multiple skin ulcerations: will continue her current plan of care is followed by the wound dr.    8. Asthma: has history of sarcoidosis: will continue albuterol neb every 6 hours as needed or albuterol 1 or 2 puffs every 6 hours as needed.   9. Possible right wrist fracture: is to see orthopedics in one week.  Will continue to monitor her status  10. Gout: uric acid level is 6.0; will begin allopurinol 50 mg daily to reduce uric acid levels.     MD is aware of resident's narcotic use and is in agreement with current plan of care. We will attempt to wean resident as apropriate   Synthia Innocent NP [facility]  Contact [phone] Monday through Friday 8am- 5pm  After hours call [phone]

## 2016-09-01 DIAGNOSIS — L89624 Pressure ulcer of left heel, stage 4: Secondary | ICD-10-CM | POA: Diagnosis not present

## 2016-09-01 DIAGNOSIS — L89150 Pressure ulcer of sacral region, unstageable: Secondary | ICD-10-CM | POA: Diagnosis not present

## 2016-09-01 DIAGNOSIS — M6281 Muscle weakness (generalized): Secondary | ICD-10-CM | POA: Diagnosis not present

## 2016-09-01 DIAGNOSIS — L89154 Pressure ulcer of sacral region, stage 4: Secondary | ICD-10-CM | POA: Diagnosis not present

## 2016-09-03 ENCOUNTER — Other Ambulatory Visit: Payer: Self-pay

## 2016-09-03 ENCOUNTER — Encounter: Payer: Self-pay | Admitting: Adult Health

## 2016-09-03 ENCOUNTER — Non-Acute Institutional Stay (SKILLED_NURSING_FACILITY): Payer: Medicare Other | Admitting: Adult Health

## 2016-09-03 DIAGNOSIS — S62101S Fracture of unspecified carpal bone, right wrist, sequela: Secondary | ICD-10-CM | POA: Diagnosis not present

## 2016-09-03 DIAGNOSIS — M159 Polyosteoarthritis, unspecified: Secondary | ICD-10-CM | POA: Insufficient documentation

## 2016-09-03 DIAGNOSIS — M150 Primary generalized (osteo)arthritis: Secondary | ICD-10-CM

## 2016-09-03 MED ORDER — TRAMADOL HCL 50 MG PO TABS: ORAL_TABLET | ORAL | 0 refills | Status: DC

## 2016-09-03 NOTE — Progress Notes (Addendum)
Location:   [facility] Room Number: 113 B Place of Service:  SNF (31)   CODE STATUS: DNR  Allergies  Allergen Reactions  . Dicyclomine Other (See Comments)    Reaction:  Dizziness   . Gabapentin Other (See Comments)    Reaction:  Dizziness   . Metformin And Related Nausea And Vomiting  . Lyrica [Pregabalin] Other (See Comments)    Reaction:  Unknown  . Topiramate Other (See Comments)    Reaction:  Unknown    Chief Complaint  Patient presents with  . Acute Visit    Staff concerns    HPI:  Her daughter has expressed concerns last night that her mother is not getting adequate pain relief. This morning the patient  tells me that she does not have an appetite. She told me that she did sleep well. She denied any pain until I move her arm. Staff reports that her daughter wants her turned every 2 hours and when she is turned she does scream out in pain. She is unable to fully participate in the hpi or ros.     Past Medical History:  Diagnosis Date  . Anxiety disorder   . Arthritis   . Asthma   . Chronic kidney disease   . Degenerative disk disease   . Diabetes mellitus   . GERD (gastroesophageal reflux disease)   . Hypercholesteremia   . Hypertension   . IBS (irritable bowel syndrome)   . Sarcoidosis   . Sepsis (HCC) 06/29/2016  . Spinal stenosis     Past Surgical History:  Procedure Laterality Date  . EYE SURGERY  2017   cataract / glaucoma    Social History   Social History  . Marital status: Divorced    Spouse name: N/A  . Number of children: N/A  . Years of education: N/A   Occupational History  . Not on file.   Social History Main Topics  . Smoking status: Former Games developermoker  . Smokeless tobacco: Never Used  . Alcohol use No  . Drug use: No  . Sexual activity: Not on file   Other Topics Concern  . Not on file   Social History Narrative  . No narrative on file   Family History  Problem Relation Age of Onset  . Hypertension Other         VITAL SIGNS BP (!) 144/78   Pulse 85   Resp 20   Ht 5\' 4"  (1.626 m)   Wt 115 lb 14.4 oz (52.6 kg)   SpO2 90%   BMI 19.89 kg/m   Patient's Medications  New Prescriptions   No medications on file  Previous Medications   ACETAMINOPHEN (TYLENOL) 500 MG TABLET    Take 2 tablets every 8 hours as needed for pain    ALBUTEROL (PROVENTIL HFA;VENTOLIN HFA) 108 (90 BASE) MCG/ACT INHALER    Inhale 2 puffs into the lungs every 6 (six) hours as needed for wheezing or shortness of breath.    ALBUTEROL (PROVENTIL) (2.5 MG/3ML) 0.083% NEBULIZER SOLUTION    Take 2.5 mg by nebulization every 6 (six) hours as needed for wheezing or shortness of breath.   ALLOPURINOL PO    Give 50 mg by mouth one time daily for gout   AMINO ACIDS-PROTEIN HYDROLYS (FEEDING SUPPLEMENT, PRO-STAT SUGAR FREE 64,) LIQD    Take 30 mLs by mouth 3 (three) times daily with meals.   ASPIRIN EC 81 MG TABLET    Take 81 mg by mouth daily.  BISACODYL (DULCOLAX) 10 MG SUPPOSITORY    Place 1 suppository (10 mg total) rectally daily as needed for moderate constipation.   CLONAZEPAM (KLONOPIN) 0.5 MG TABLET    Give 0.25 mg by mouth three times daily for agitation.  Hold for Sedation   COLLAGENASE (SANTYL) OINTMENT    Apply to Coccyx topically every day and night shift for Blackened Toes   DICLOFENAC SODIUM (VOLTAREN) 1 % GEL    APPLY 2 GRAMS EXTERNALLY TO THE AFFECTED AREA FOUR TIMES DAILY AS NEEDED FOR PAIN   MENTHOL, TOPICAL ANALGESIC, (BIOFREEZE) 4 % GEL    Apply topically. Apply to lower back topically every shift for back pain.  Apply to bilateral shoulders topically every day and night shift for pain management   MULTIPLE VITAMINS-MINERALS (DECUBI-VITE) CAPS    Take 1 capsule by mouth daily.   NUTRITIONAL SUPPLEMENT LIQD    House Supplement - Med Pass 120cc by mouth three times daily   NUTRITIONAL SUPPLEMENTS (NUTRITIONAL SUPPLEMENT PO)    House Supplement - Give Nutritional Treat by mouth with meals daily   OMEPRAZOLE  (PRILOSEC) 40 MG CAPSULE    Take 40 mg by mouth daily.    OSTOMY SUPPLIES (SKIN PREP SPRAY) MISC    Apply to toes topically two times a day for Blackened toes   TRAMADOL (ULTRAM) 50 MG TABLET    Give 0.5 tablet by mouth every 6 hours for Pain Management to equal 25 mg   UNABLE TO FIND    HSG Regular Diet - HSG Puree texture, Regular consistency   WOUND DRESSINGS GEL    Medihoney - Apply to left lateral sacrum topically every day shift for wound care  Modified Medications   No medications on file  Discontinued Medications   No medications on file     SIGNIFICANT DIAGNOSTIC EXAMS  06-29-16:right shoulder x-ray: Right shoulder arthropathy without acute process Osteopenia  06-29-16: chest x-ray: Chronic scarring in the apices bilaterally.  06-29-16: kub: Unremarkable bowel gas pattern; no free intra-abdominal air seen. Moderate to large amount of stool noted in the colon.  07-16-16: right lower extremity arterial doppler : no doppler evidence of stenosis limited exam  08-28-16: right wrist x-ray: possible nondisplaced scaphoid fracture   08-29-16: right wrist x-ray: (ED) 1. Possible lucency/nondisplaced fracture involving the mid distalscaphoid 2. Degenerative changes of the wrist.   08-30-16: left wrist x-ray: no acute bone or joint abnormality is seen in the wrist are. Osteoarthritis first carpometacarpal oint. There is a suggestion of tissue swelling in the proximal forearm or elbow region.   NO NEW EXAMS   LABS REVIEWED:   05-16-16: hgb a1c 6.6 06-29-16: wbc 7.0; hgb 10.3; hct 31.7; mcv 89.0; plt 272; glucose 112; bun 21; creat 0.79; k+ 3.9; na++ 139; ca 9.4; liver normal albumin 3.0 blood culture: no growth; urine culture: multiple bacteria 06-30-16: wbc 13.6; hgb 10.3; hct 32.7; mcv 91.6; plt 218; glucose 104; bun 22; creat 0.83; k+ 3.7; na++ 142;ca 8.7; liver normal albumin 2.7; mag 1.6 07-01-16: wbc 10.7; hgb 9.2; hct 27.4; mcv 89.5; plt 223; glucose 204; bun 24; creat 0.83; k+ 3.7; na++  139; mag 2.3  07-09-16: wbc 6.4; hgb 10.7; hct 35.1; mcv 95.6; plt 271; glucose 137; bun 17.1; creat 0.92; k+ 4.9; na++ 141; ca 9.9; liver normal albumin 3.1  07-22-16: glucose 110; bun 29.7; creat 0.74; k+ 5.3; na++ 144; ca 10.2  08-06-16: glucose 178; bun 22.2; creat 0.6; k+ 4.5; na++ 135; ca 9.3  08-29-16: wbc 9.2; hgb  10.4; hct 33.2; mcv 90.8; plt 339; uric acid 6.0   NO NEW LABS    Review of Systems  Unable to perform ROS: Other (cognitive impairment )    Physical Exam  Constitutional: No distress.  Eyes: Conjunctivae are normal.  Neck: Neck supple. No JVD present. No thyromegaly present.  Cardiovascular: Normal rate, regular rhythm and intact distal pulses.   Respiratory: Effort normal and breath sounds normal. No respiratory distress. She has no wheezes.  GI: Soft. Bowel sounds are normal. She exhibits no distension. There is no tenderness.  Musculoskeletal: She exhibits no edema.  Able to move all extremities   Lymphadenopathy:    She has no cervical adenopathy.  Neurological: She is alert.  Skin: Skin is warm and dry. She is not diaphoretic.  Right wrist is in splint and ace wrap rapid capillary refill; is able to move fingers.  Left posterior heel stage IV: 6.9 x 6.5 x 0.4 cm 65% necrotic 35% granulation : betadine wet to moist Right posterior heel stage IV: 2.1 x 4.8 x 0.2 cm 25% slough 75% granulation silver alginate   Right sacrum stage IV:   2.9 x 1.8 x 0.4 cm 100% granulation anasept wet to moist  Coccyx stage III: 1.8 x 1.8 x 0.2 cm 100% granulation medihoney Left lateral sacrum stage III: 1.5 x 3.2 x 0.1 cm 60% necrotic 20% granulation medihoney     ASSESSMENT/ PLAN:   1. Possible right wrist fracture:  2. Osteoarthritis:   Her pain is not being adequately managed.  Will change her ultram to 50 mg every 6 hours routinely  Will change voltaren gel to 2 gm four times daily to bilateral shoulders and bilateral knees.  Will stop biofreeze as this medication is  probably not providing her with any significant relief Will begin lidoerm 4% patch to lower back on in the AM and off in the PM   MD is aware of resident's narcotic use and is in agreement with current plan of care. We will attempt to wean resident as apropriate     Synthia Innocenteborah Green NP [facility]  Contact [phone] Monday through Friday 8am- 5pm  After hours call [phone]

## 2016-09-07 ENCOUNTER — Non-Acute Institutional Stay (SKILLED_NURSING_FACILITY): Payer: Medicare Other

## 2016-09-07 DIAGNOSIS — Z0000 Encounter for general adult medical examination without abnormal findings: Secondary | ICD-10-CM

## 2016-09-07 NOTE — Patient Instructions (Signed)
Ms. Elizabeth Keller , Thank you for taking time to come for your Medicare Wellness Visit. I appreciate your ongoing commitment to your health goals. Please review the following plan we discussed and let me know if I can assist you in the future.   Screening recommendations/referrals: Colonoscopy excluded, pt over age 81 Mammogram excluded, pt over age 875 Bone Density due Recommended yearly ophthalmology/optometry visit for glaucoma screening and checkup Recommended yearly dental visit for hygiene and checkup  Vaccinations: Influenza vaccine due 2018 fall season Pneumococcal vaccine up to date Tdap vaccine due, facility declined Shingles vaccine not in records  Advanced directives: In Chart  Conditions/risks identified: None  Next appointment: Dr. Montez Moritaarter makes rounds   Preventive Care 65 Years and Older, Female Preventive care refers to lifestyle choices and visits with your health care provider that can promote health and wellness. What does preventive care include?  A yearly physical exam. This is also called an annual well check.  Dental exams once or twice a year.  Routine eye exams. Ask your health care provider how often you should have your eyes checked.  Personal lifestyle choices, including:  Daily care of your teeth and gums.  Regular physical activity.  Eating a healthy diet.  Avoiding tobacco and drug use.  Limiting alcohol use.  Practicing safe sex.  Taking low-dose aspirin every day.  Taking vitamin and mineral supplements as recommended by your health care provider. What happens during an annual well check? The services and screenings done by your health care provider during your annual well check will depend on your age, overall health, lifestyle risk factors, and family history of disease. Counseling  Your health care provider may ask you questions about your:  Alcohol use.  Tobacco use.  Drug use.  Emotional well-being.  Home and relationship  well-being.  Sexual activity.  Eating habits.  History of falls.  Memory and ability to understand (cognition).  Work and work Astronomerenvironment.  Reproductive health. Screening  You may have the following tests or measurements:  Height, weight, and BMI.  Blood pressure.  Lipid and cholesterol levels. These may be checked every 5 years, or more frequently if you are over 81 years old.  Skin check.  Lung cancer screening. You may have this screening every year starting at age 81 if you have a 30-pack-year history of smoking and currently smoke or have quit within the past 15 years.  Fecal occult blood test (FOBT) of the stool. You may have this test every year starting at age 81.  Flexible sigmoidoscopy or colonoscopy. You may have a sigmoidoscopy every 5 years or a colonoscopy every 10 years starting at age 81.  Hepatitis C blood test.  Hepatitis B blood test.  Sexually transmitted disease (STD) testing.  Diabetes screening. This is done by checking your blood sugar (glucose) after you have not eaten for a while (fasting). You may have this done every 1-3 years.  Bone density scan. This is done to screen for osteoporosis. You may have this done starting at age 81.  Mammogram. This may be done every 1-2 years. Talk to your health care provider about how often you should have regular mammograms. Talk with your health care provider about your test results, treatment options, and if necessary, the need for more tests. Vaccines  Your health care provider may recommend certain vaccines, such as:  Influenza vaccine. This is recommended every year.  Tetanus, diphtheria, and acellular pertussis (Tdap, Td) vaccine. You may need a Td booster every  10 years.  Zoster vaccine. You may need this after age 76.  Pneumococcal 13-valent conjugate (PCV13) vaccine. One dose is recommended after age 82.  Pneumococcal polysaccharide (PPSV23) vaccine. One dose is recommended after age  87. Talk to your health care provider about which screenings and vaccines you need and how often you need them. This information is not intended to replace advice given to you by your health care provider. Make sure you discuss any questions you have with your health care provider. Document Released: 02/22/2015 Document Revised: 10/16/2015 Document Reviewed: 11/27/2014 Elsevier Interactive Patient Education  2017 [place] Prevention in the Home Falls can cause injuries. They can happen to people of all ages. There are many things you can do to make your home safe and to help prevent falls. What can I do on the outside of my home?  Regularly fix the edges of walkways and driveways and fix any cracks.  Remove anything that might make you trip as you walk through a door, such as a raised step or threshold.  Trim any bushes or trees on the path to your home.  Use bright outdoor lighting.  Clear any walking paths of anything that might make someone trip, such as rocks or tools.  Regularly check to see if handrails are loose or broken. Make sure that both sides of any steps have handrails.  Any raised decks and porches should have guardrails on the edges.  Have any leaves, snow, or ice cleared regularly.  Use sand or salt on walking paths during winter.  Clean up any spills in your garage right away. This includes oil or grease spills. What can I do in the bathroom?  Use night lights.  Install grab bars by the toilet and in the tub and shower. Do not use towel bars as grab bars.  Use non-skid mats or decals in the tub or shower.  If you need to sit down in the shower, use a plastic, non-slip stool.  Keep the floor dry. Clean up any water that spills on the floor as soon as it happens.  Remove soap buildup in the tub or shower regularly.  Attach bath mats securely with double-sided non-slip rug tape.  Do not have throw rugs and other things on the floor that can make  you trip. What can I do in the bedroom?  Use night lights.  Make sure that you have a light by your bed that is easy to reach.  Do not use any sheets or blankets that are too big for your bed. They should not hang down onto the floor.  Have a firm chair that has side arms. You can use this for support while you get dressed.  Do not have throw rugs and other things on the floor that can make you trip. What can I do in the kitchen?  Clean up any spills right away.  Avoid walking on wet floors.  Keep items that you use a lot in easy-to-reach places.  If you need to reach something above you, use a strong step stool that has a grab bar.  Keep electrical cords out of the way.  Do not use floor polish or wax that makes floors slippery. If you must use wax, use non-skid floor wax.  Do not have throw rugs and other things on the floor that can make you trip. What can I do with my stairs?  Do not leave any items on the stairs.  Make sure  that there are handrails on both sides of the stairs and use them. Fix handrails that are broken or loose. Make sure that handrails are as long as the stairways.  Check any carpeting to make sure that it is firmly attached to the stairs. Fix any carpet that is loose or worn.  Avoid having throw rugs at the top or bottom of the stairs. If you do have throw rugs, attach them to the floor with carpet tape.  Make sure that you have a light switch at the top of the stairs and the bottom of the stairs. If you do not have them, ask someone to add them for you. What else can I do to help prevent falls?  Wear shoes that:  Do not have high heels.  Have rubber bottoms.  Are comfortable and fit you well.  Are closed at the toe. Do not wear sandals.  If you use a stepladder:  Make sure that it is fully opened. Do not climb a closed stepladder.  Make sure that both sides of the stepladder are locked into place.  Ask someone to hold it for you, if  possible.  Clearly mark and make sure that you can see:  Any grab bars or handrails.  First and last steps.  Where the edge of each step is.  Use tools that help you move around (mobility aids) if they are needed. These include:  Canes.  Walkers.  Scooters.  Crutches.  Turn on the lights when you go into a dark area. Replace any light bulbs as soon as they burn out.  Set up your furniture so you have a clear path. Avoid moving your furniture around.  If any of your floors are uneven, fix them.  If there are any pets around you, be aware of where they are.  Review your medicines with your doctor. Some medicines can make you feel dizzy. This can increase your chance of falling. Ask your doctor what other things that you can do to help prevent falls. This information is not intended to replace advice given to you by your health care provider. Make sure you discuss any questions you have with your health care provider. Document Released: 11/22/2008 Document Revised: 07/04/2015 Document Reviewed: 03/02/2014 Elsevier Interactive Patient Education  2017 [employer].

## 2016-09-07 NOTE — Progress Notes (Signed)
Subjective:   Elizabeth Keller is a 83 y.o. female who presents for Medicare Annual (Subsequent) preventive examination at [facility]; incapacitated patient unable to answer questions appropriately    Last AWV- 10/23/15  Objective:     Vitals: BP (!) 160/70 (BP Location: Right Arm, Patient Position: Supine)   Pulse 85   Temp 98.5 F (36.9 C) (Oral)   Ht 5\' 4"  (1.626 m)   Wt 118 lb (53.5 kg)   SpO2 90%   BMI 20.25 kg/m   Body mass index is 20.25 kg/m.   Tobacco History  Smoking Status  . Former Smoker  Smokeless Tobacco  . Never Used     Counseling given: Not Answered   Past Medical History:  Diagnosis Date  . Anxiety disorder   . Arthritis   . Asthma   . Chronic kidney disease   . Degenerative disk disease   . Diabetes mellitus   . GERD (gastroesophageal reflux disease)   . Hypercholesteremia   . Hypertension   . IBS (irritable bowel syndrome)   . Sarcoidosis   . Sepsis (HCC) 06/29/2016  . Spinal stenosis    Past Surgical History:  Procedure Laterality Date  . EYE SURGERY  2017   cataract / glaucoma   Family History  Problem Relation Age of Onset  . Hypertension Other    History  Sexual Activity  . Sexual activity: Not on file    Outpatient Encounter Prescriptions as of 09/07/2016  Medication Sig  . acetaminophen (TYLENOL) 500 MG tablet Take 2 tablets every 8 hours as needed for pain   . albuterol (PROVENTIL HFA;VENTOLIN HFA) 108 (90 Base) MCG/ACT inhaler Inhale 2 puffs into the lungs every 6 (six) hours as needed for wheezing or shortness of breath.   Marland Kitchen. albuterol (PROVENTIL) (2.5 MG/3ML) 0.083% nebulizer solution Take 2.5 mg by nebulization every 6 (six) hours as needed for wheezing or shortness of breath.  . ALLOPURINOL PO Give 50 mg by mouth one time daily for gout  . Amino Acids-Protein Hydrolys (FEEDING SUPPLEMENT, PRO-STAT SUGAR FREE 64,) LIQD Take 30 mLs by mouth 3 (three) times daily with meals.  Marland Kitchen. aspirin EC 81 MG tablet Take 81  mg by mouth daily.  . bisacodyl (DULCOLAX) 10 MG suppository Place 1 suppository (10 mg total) rectally daily as needed for moderate constipation.  Marland Kitchen. CLONAZEPAM PO Take 0.25 mg by mouth 3 (three) times daily as needed.  . collagenase (SANTYL) ointment Apply to Coccyx topically every day and night shift for Blackened Toes  . diclofenac sodium (VOLTAREN) 1 % GEL APPLY 2 GRAMS EXTERNALLY TO THE AFFECTED AREA FOUR TIMES DAILY AS NEEDED FOR PAIN  . Menthol, Topical Analgesic, (BIOFREEZE) 4 % GEL Apply topically. Apply to lower back topically every shift for back pain.  Apply to bilateral shoulders topically every day and night shift for pain management  . Multiple Vitamins-Minerals (DECUBI-VITE) CAPS Take 1 capsule by mouth daily.  Marland Kitchen. NUTRITIONAL SUPPLEMENT LIQD House Supplement - Med Pass 120cc by mouth three times daily  . Nutritional Supplements (NUTRITIONAL SUPPLEMENT PO) House Supplement - Give Nutritional Treat by mouth with meals daily  . omeprazole (PRILOSEC) 40 MG capsule Take 40 mg by mouth daily.   . Ostomy Supplies (SKIN PREP SPRAY) MISC Apply to toes topically two times a day for Blackened toes  . traMADol (ULTRAM) 50 MG tablet Give 1 tablet by mouth every 6 hours routinely  . UNABLE TO FIND HSG Regular Diet - HSG Puree texture, Regular  consistency  . wound dressings gel Medihoney - Apply to left lateral sacrum topically every day shift for wound care  . [DISCONTINUED] clonazePAM (KLONOPIN) 0.5 MG tablet Give 0.25 mg by mouth three times daily for agitation.  Hold for Sedation   No facility-administered encounter medications on file as of 09/07/2016.     Activities of Daily Living In your present state of health, do you have any difficulty performing the following activities: 09/07/2016 06/29/2016  Hearing? N Y  Vision? N Y  Difficulty concentrating or making decisions? Y Y  Comment - -  Walking or climbing stairs? Y Y  Dressing or bathing? Y N  Doing errands, shopping? Malvin JohnsY Y  Preparing  Food and eating ? Y -  Using the Toilet? Y -  In the past six months, have you accidently leaked urine? Y -  Do you have problems with loss of bowel control? Y -  Managing your Medications? Y -  Managing your Finances? Y -  Housekeeping or managing your Housekeeping? Y -  Some recent data might be hidden    Patient Care Team: Sandford Craze'Sullivan, Melissa, NP as PCP - General (Internal Medicine)    Assessment:     Exercise Activities and Dietary recommendations Current Exercise Habits: The patient does not participate in regular exercise at present, Exercise limited by: neurologic condition(s);orthopedic condition(s)  Goals    None     Fall Risk Fall Risk  09/07/2016  Falls in the past year? No   Depression Screen PHQ 2/9 Scores 09/07/2016  PHQ - 2 Score 0     Cognitive Function MMSE - Mini Mental State Exam 09/07/2016  Not completed: Unable to complete        Immunization History  Administered Date(s) Administered  . Influenza,inj,Quad PF,36+ Mos 03/08/2013  . Influenza-Unspecified 12/26/2014  . PPD Test 07/04/2016  . Pneumococcal Conjugate-13 10/03/2014  . Pneumococcal Polysaccharide-23 02/09/2010   Screening Tests Health Maintenance  Topic Date Due  . FOOT EXAM  07/15/2017 (Originally 09/30/1933)  . OPHTHALMOLOGY EXAM  07/15/2017 (Originally 09/30/1933)  . URINE MICROALBUMIN  07/15/2017 (Originally 09/30/1933)  . DEXA SCAN  07/15/2017 (Originally 09/30/1990)  . TETANUS/TDAP  07/15/2017 (Originally 09/29/1942)  . INFLUENZA VACCINE  09/09/2016  . HEMOGLOBIN A1C  11/15/2016  . PNA vac Low Risk Adult  Completed      Plan:    I have personally reviewed and addressed the Medicare Annual Wellness questionnaire and have noted the following in the patient's chart:  A. Medical and social history B. Use of alcohol, tobacco or illicit drugs  C. Current medications and supplements D. Functional ability and status E.  Nutritional status F.  Physical activity G. Advance  directives H. List of other physicians I.  Hospitalizations, surgeries, and ER visits in previous 12 months J.  Vitals K. Screenings to include hearing, vision, cognitive, depression L. Referrals and appointments - none  In addition, I am unable to review and discuss with incapacitated patient certain preventive protocols, quality metrics, and best practice recommendations. A written personalized care plan for preventive services as well as general preventive health recommendations were provided to patient.  See attached scanned questionnaire for additional information.   Signed,   Annetta MawSara Gonthier, RN Nurse Health Advisor   Quick Notes   Health Maintenance: TDAP, foot exam, eye exam, urine microalbumin to creatine ratio, DEXA due     Abnormal Screen: Unable to completel mental exam     Patient Concerns: R wrist pain     Nurse Concerns:  None

## 2016-09-08 DIAGNOSIS — L89624 Pressure ulcer of left heel, stage 4: Secondary | ICD-10-CM | POA: Diagnosis not present

## 2016-09-08 DIAGNOSIS — M25531 Pain in right wrist: Secondary | ICD-10-CM | POA: Diagnosis not present

## 2016-09-08 DIAGNOSIS — L89614 Pressure ulcer of right heel, stage 4: Secondary | ICD-10-CM | POA: Diagnosis not present

## 2016-09-08 DIAGNOSIS — L89153 Pressure ulcer of sacral region, stage 3: Secondary | ICD-10-CM | POA: Diagnosis not present

## 2016-09-08 LAB — MICROALBUMIN, URINE: Microalb, Ur: 2.5

## 2016-09-15 DIAGNOSIS — L89614 Pressure ulcer of right heel, stage 4: Secondary | ICD-10-CM | POA: Diagnosis not present

## 2016-09-15 DIAGNOSIS — L89154 Pressure ulcer of sacral region, stage 4: Secondary | ICD-10-CM | POA: Diagnosis not present

## 2016-09-15 DIAGNOSIS — L89624 Pressure ulcer of left heel, stage 4: Secondary | ICD-10-CM | POA: Diagnosis not present

## 2016-09-17 ENCOUNTER — Institutional Professional Consult (permissible substitution): Payer: Medicare Other | Admitting: Pulmonary Disease

## 2016-09-18 ENCOUNTER — Encounter: Payer: Self-pay | Admitting: Adult Health

## 2016-09-18 ENCOUNTER — Institutional Professional Consult (permissible substitution): Payer: Medicare Other | Admitting: Pulmonary Disease

## 2016-09-21 ENCOUNTER — Non-Acute Institutional Stay (SKILLED_NURSING_FACILITY): Payer: Medicare Other | Admitting: Adult Health

## 2016-09-21 ENCOUNTER — Encounter: Payer: Self-pay | Admitting: Adult Health

## 2016-09-21 DIAGNOSIS — M150 Primary generalized (osteo)arthritis: Secondary | ICD-10-CM | POA: Diagnosis not present

## 2016-09-21 DIAGNOSIS — G894 Chronic pain syndrome: Secondary | ICD-10-CM | POA: Diagnosis not present

## 2016-09-21 DIAGNOSIS — M4800 Spinal stenosis, site unspecified: Secondary | ICD-10-CM

## 2016-09-21 DIAGNOSIS — M159 Polyosteoarthritis, unspecified: Secondary | ICD-10-CM

## 2016-09-21 NOTE — Progress Notes (Signed)
Location:   [facility] Room Number: 113 B Place of Service:  SNF (31)   CODE STATUS: DNR  Allergies  Allergen Reactions  . Dicyclomine Other (See Comments)    Reaction:  Dizziness   . Gabapentin Other (See Comments)    Reaction:  Dizziness   . Metformin And Related Nausea And Vomiting  . Lyrica [Pregabalin] Other (See Comments)    Reaction:  Unknown  . Topiramate Other (See Comments)    Reaction:  Unknown    Chief Complaint  Patient presents with  . Acute Visit    Increased lethargy / decreased appetite    HPI:  Her daughter has expressed concerns of her having increased lethargy and decreased appetite. Her daughter is wanting her pain medication decreased.  I have spoken with the nursing staff and have had extensive discussion regarding the pain she is experiencing. She does yell/scream every time she is turned from side to side. Her appetite has not changed since her admission to this facility. She will eat well periodically especially if family is present. I have spoken with her son; he does not feel as though she is lethargic. She is unable to participate in the hpi ros.    Past Medical History:  Diagnosis Date  . Anxiety disorder   . Arthritis   . Asthma   . Chronic kidney disease   . Degenerative disk disease   . Diabetes mellitus   . GERD (gastroesophageal reflux disease)   . Hypercholesteremia   . Hypertension   . IBS (irritable bowel syndrome)   . Sarcoidosis   . Sepsis (HCC) 06/29/2016  . Spinal stenosis     Past Surgical History:  Procedure Laterality Date  . EYE SURGERY  2017   cataract / glaucoma    Social History   Social History  . Marital status: Divorced    Spouse name: N/A  . Number of children: N/A  . Years of education: N/A   Occupational History  . Not on file.   Social History Main Topics  . Smoking status: Former Games developer  . Smokeless tobacco: Never Used  . Alcohol use No  . Drug use: No  . Sexual activity: Not  on file   Other Topics Concern  . Not on file   Social History Narrative  . No narrative on file   Family History  Problem Relation Age of Onset  . Hypertension Other       VITAL SIGNS BP (!) 101/57   Pulse 72   Temp 98.2 F (36.8 C)   Resp 16   Ht 5\' 4"  (1.626 m)   Wt 121 lb (54.9 kg)   SpO2 96%   BMI 20.77 kg/m   Patient's Medications  New Prescriptions   No medications on file  Previous Medications   ACETAMINOPHEN (TYLENOL) 500 MG TABLET    Take 2 tablets every 8 hours as needed for pain    ALBUTEROL (PROVENTIL HFA;VENTOLIN HFA) 108 (90 BASE) MCG/ACT INHALER    Inhale 2 puffs into the lungs every 6 (six) hours as needed for wheezing or shortness of breath.    ALBUTEROL (PROVENTIL) (2.5 MG/3ML) 0.083% NEBULIZER SOLUTION    Take 2.5 mg by nebulization every 6 (six) hours as needed for wheezing or shortness of breath.   ALLOPURINOL PO    Give 50 mg by mouth one time daily for gout   AMINO ACIDS-PROTEIN HYDROLYS (FEEDING SUPPLEMENT, PRO-STAT SUGAR FREE 64,) LIQD    Take 30 mLs by  mouth 3 (three) times daily with meals.   ASPIRIN EC 81 MG TABLET    Take 81 mg by mouth daily.   BISACODYL (DULCOLAX) 10 MG SUPPOSITORY    Place 1 suppository (10 mg total) rectally daily as needed for moderate constipation.   CLONAZEPAM PO    Take 0.25 mg by mouth 3 (three) times daily as needed.   COLLAGENASE (SANTYL) OINTMENT    Apply to Coccyx topically every day and night shift for Blackened Toes   DICLOFENAC SODIUM (VOLTAREN) 1 % GEL    APPLY 2 GRAMS EXTERNALLY TO THE AFFECTED AREA FOUR TIMES DAILY AS NEEDED FOR PAIN   LIDOCAINE 4 % PTCH    Apply to lower back topically in AM one time a day for pain management.  Remove after 12 hours   MENTHOL, TOPICAL ANALGESIC, (BIOFREEZE) 4 % GEL    Apply topically. Apply to lower back topically every shift for back pain.  Apply to bilateral shoulders topically every day and night shift for pain management   MULTIPLE VITAMINS-MINERALS (DECUBI-VITE) CAPS     Take 1 capsule by mouth daily.   NUTRITIONAL SUPPLEMENT LIQD    House Supplement - Med Pass 120cc by mouth three times daily   NUTRITIONAL SUPPLEMENTS (NUTRITIONAL SUPPLEMENT PO)    House Supplement - Give Nutritional Treat by mouth with meals daily   OMEPRAZOLE (PRILOSEC) 40 MG CAPSULE    Take 40 mg by mouth daily.    OSTOMY SUPPLIES (SKIN PREP SPRAY) MISC    Apply to toes topically two times a day for Blackened toes   TRAMADOL (ULTRAM) 50 MG TABLET    Take 50 mg by mouth every 6 (six) hours.    UNABLE TO FIND    HSG Regular Diet - HSG Puree texture, Regular consistency  Modified Medications   No medications on file  Discontinued Medications   WOUND DRESSINGS GEL    Medihoney - Apply to left lateral sacrum topically every day shift for wound care     SIGNIFICANT DIAGNOSTIC EXAMS  PREVIOUS:   06-29-16:right shoulder x-ray: Right shoulder arthropathy without acute process Osteopenia  06-29-16: chest x-ray: Chronic scarring in the apices bilaterally.  06-29-16: kub: Unremarkable bowel gas pattern; no free intra-abdominal air seen. Moderate to large amount of stool noted in the colon.  07-16-16: right lower extremity arterial doppler : no doppler evidence of stenosis limited exam  08-28-16: right wrist x-ray: possible nondisplaced scaphoid fracture   08-29-16: right wrist x-ray: (ED) 1. Possible lucency/nondisplaced fracture involving the mid distalscaphoid 2. Degenerative changes of the wrist.   08-30-16: left wrist x-ray: no acute bone or joint abnormality is seen in the wrist are. Osteoarthritis first carpometacarpal oint. There is a suggestion of tissue swelling in the proximal forearm or elbow region.   NO NEW EXAMS   LABS REVIEWED: PREVIOUS    05-16-16: hgb a1c 6.6 06-29-16: wbc 7.0; hgb 10.3; hct 31.7; mcv 89.0; plt 272; glucose 112; bun 21; creat 0.79; k+ 3.9; na++ 139; ca 9.4; liver normal albumin 3.0 blood culture: no growth; urine culture: multiple bacteria 06-30-16: wbc 13.6; hgb  10.3; hct 32.7; mcv 91.6; plt 218; glucose 104; bun 22; creat 0.83; k+ 3.7; na++ 142;ca 8.7; liver normal albumin 2.7; mag 1.6 07-01-16: wbc 10.7; hgb 9.2; hct 27.4; mcv 89.5; plt 223; glucose 204; bun 24; creat 0.83; k+ 3.7; na++ 139; mag 2.3  07-09-16: wbc 6.4; hgb 10.7; hct 35.1; mcv 95.6; plt 271; glucose 137; bun 17.1; creat 0.92; k+ 4.9;  na++ 141; ca 9.9; liver normal albumin 3.1  07-22-16: glucose 110; bun 29.7; creat 0.74; k+ 5.3; na++ 144; ca 10.2  08-06-16: glucose 178; bun 22.2; creat 0.6; k+ 4.5; na++ 135; ca 9.3  08-29-16: wbc 9.2; hgb 10.4; hct 33.2; mcv 90.8; plt 339; uric acid 6.0   NO NEW LABS   Review of Systems  Unable to perform ROS: Other (cognitive impairment )     Physical Exam  Constitutional: No distress.  frail  Eyes: Conjunctivae are normal.  Neck: Neck supple. No JVD present. No thyromegaly present.  Cardiovascular: Normal rate, regular rhythm and intact distal pulses.   Respiratory: Effort normal and breath sounds normal. No respiratory distress. She has no wheezes.  GI: Soft. Bowel sounds are normal. She exhibits no distension. There is no tenderness.  Musculoskeletal: She exhibits no edema.  Has limited range of motions to upper extremities Does not move lower extremities   Lymphadenopathy:    She has no cervical adenopathy.  Neurological: She is alert.  Skin: Skin is warm and dry. She is not diaphoretic.  Right wrist is in splint  is able to move fingers.  Left posterior heel stage IV: 6.9 x 6.5 x 0.4 cm 65% necrotic 35% granulation : betadine wet to moist Right posterior heel stage IV: 2.1 x 4.8 x 0.2 cm 25% slough 75% granulation silver alginate   Right sacrum stage IV:   2.9 x 1.8 x 0.4 cm 100% granulation anasept wet to moist  Coccyx stage III: 1.8 x 1.8 x 0.2 cm 100% granulation medihoney Left lateral sacrum stage III: 1.5 x 3.2 x 0.1 cm 60% necrotic 20% granulation medihoney     ASSESSMENT/ PLAN:   3. DJD has spinal stenosis; degenerative disc  disease and chronic pain  : no indications of pain present; has voltaren gel four times daily as needed is using biofreeze to back and bilateral shoulders twice daily    Will continue her ultram  25 mg every 6 hours to help manage her pain  I have explained to her son that we need to manage her pain; that she does suffer from her pain. He has verbalized understanding.       MD is aware of resident's narcotic use and is in agreement with current plan of care. We will attempt to wean resident as apropriate     Synthia Innocenteborah Aianna Fahs NP [facility]  Contact [phone] Monday through Friday 8am- 5pm  After hours call [phone]

## 2016-09-23 ENCOUNTER — Non-Acute Institutional Stay (SKILLED_NURSING_FACILITY): Payer: Medicare Other | Admitting: Adult Health

## 2016-09-23 ENCOUNTER — Encounter: Payer: Self-pay | Admitting: Adult Health

## 2016-09-23 DIAGNOSIS — N183 Chronic kidney disease, stage 3 unspecified: Secondary | ICD-10-CM

## 2016-09-23 NOTE — Progress Notes (Signed)
Location:   [facility] Room Number: 113 B Place of Service:  SNF (31)   CODE STATUS: DNR  Allergies  Allergen Reactions  . Dicyclomine Other (See Comments)    Reaction:  Dizziness   . Gabapentin Other (See Comments)    Reaction:  Dizziness   . Metformin And Related Nausea And Vomiting  . Lyrica [Pregabalin] Other (See Comments)    Reaction:  Unknown  . Topiramate Other (See Comments)    Reaction:  Unknown    Chief Complaint  Patient presents with  . Acute Visit    Dark Urine    HPI:  Her daughter is concerned about her urine being too dark; and being sick form it.  Her fluid intake is poor; as well as her intake of food. She is unable to participate in the hpi or ros. The urine in her foley is concentrated; the urine tin the tube is clear yellow. There are no reports of fever.   Past Medical History:  Diagnosis Date  . Anxiety disorder   . Arthritis   . Asthma   . Chronic kidney disease   . Degenerative disk disease   . Diabetes mellitus   . GERD (gastroesophageal reflux disease)   . Hypercholesteremia   . Hypertension   . IBS (irritable bowel syndrome)   . Sarcoidosis   . Sepsis (HCC) 06/29/2016  . Spinal stenosis     Past Surgical History:  Procedure Laterality Date  . EYE SURGERY  2017   cataract / glaucoma    Social History   Social History  . Marital status: Divorced    Spouse name: N/A  . Number of children: N/A  . Years of education: N/A   Occupational History  . Not on file.   Social History Main Topics  . Smoking status: Former Games developermoker  . Smokeless tobacco: Never Used  . Alcohol use No  . Drug use: No  . Sexual activity: Not on file   Other Topics Concern  . Not on file   Social History Narrative  . No narrative on file   Family History  Problem Relation Age of Onset  . Hypertension Other       VITAL SIGNS BP 98/68   Pulse 78   Temp 98.4 F (36.9 C)   Resp 16   Ht 5\' 4"  (1.626 m)   Wt 121 lb (54.9 kg)    SpO2 97%   BMI 20.77 kg/m    Patient's Medications  New Prescriptions   No medications on file  Previous Medications   ACETAMINOPHEN (TYLENOL) 500 MG TABLET    Take 2 tablets every 8 hours as needed for pain    ALBUTEROL (PROVENTIL HFA;VENTOLIN HFA) 108 (90 BASE) MCG/ACT INHALER    Inhale 2 puffs into the lungs every 6 (six) hours as needed for wheezing or shortness of breath.    ALBUTEROL (PROVENTIL) (2.5 MG/3ML) 0.083% NEBULIZER SOLUTION    Take 2.5 mg by nebulization every 6 (six) hours as needed for wheezing or shortness of breath.   ALLOPURINOL PO    Give 50 mg by mouth one time daily for gout   AMINO ACIDS-PROTEIN HYDROLYS (FEEDING SUPPLEMENT, PRO-STAT SUGAR FREE 64,) LIQD    Take 30 mLs by mouth 3 (three) times daily with meals.   ASPIRIN EC 81 MG TABLET    Take 81 mg by mouth daily.   BISACODYL (DULCOLAX) 10 MG SUPPOSITORY    Place 1 suppository (10 mg total) rectally daily as  needed for moderate constipation.   CLONAZEPAM PO    Take 0.25 mg by mouth 3 (three) times daily as needed.   COLLAGENASE (SANTYL) OINTMENT    Apply to Coccyx topically every day and night shift for Blackened Toes   DICLOFENAC SODIUM (VOLTAREN) 1 % GEL    APPLY 2 GRAMS EXTERNALLY TO THE AFFECTED AREA FOUR TIMES DAILY AS NEEDED FOR PAIN   LIDOCAINE 4 % PTCH    Apply to lower back topically in AM one time a day for pain management.  Remove after 12 hours   MENTHOL, TOPICAL ANALGESIC, (BIOFREEZE) 4 % GEL    Apply topically. Apply to lower back topically every shift for back pain.  Apply to bilateral shoulders topically every day and night shift for pain management   MULTIPLE VITAMINS-MINERALS (DECUBI-VITE) CAPS    Take 1 capsule by mouth daily.   NUTRITIONAL SUPPLEMENT LIQD    House Supplement - Med Pass 120cc by mouth three times daily   NUTRITIONAL SUPPLEMENTS (NUTRITIONAL SUPPLEMENT PO)    House Supplement - Give Nutritional Treat by mouth with meals daily   OMEPRAZOLE (PRILOSEC) 40 MG CAPSULE    Take 40 mg by  mouth daily.    OSTOMY SUPPLIES (SKIN PREP SPRAY) MISC    Apply to toes topically two times a day for Blackened toes   TRAMADOL (ULTRAM) 50 MG TABLET    Take 50 mg by mouth every 6 (six) hours.    UNABLE TO FIND    HSG Regular Diet - HSG Puree texture, Regular consistency  Modified Medications   No medications on file  Discontinued Medications   No medications on file     SIGNIFICANT DIAGNOSTIC EXAMS   PREVIOUS:   06-29-16:right shoulder x-ray: Right shoulder arthropathy without acute process Osteopenia  06-29-16: chest x-ray: Chronic scarring in the apices bilaterally.  06-29-16: kub: Unremarkable bowel gas pattern; no free intra-abdominal air seen. Moderate to large amount of stool noted in the colon.  07-16-16: right lower extremity arterial doppler : no doppler evidence of stenosis limited exam  08-28-16: right wrist x-ray: possible nondisplaced scaphoid fracture   08-29-16: right wrist x-ray: (ED) 1. Possible lucency/nondisplaced fracture involving the mid distalscaphoid 2. Degenerative changes of the wrist.   08-30-16: left wrist x-ray: no acute bone or joint abnormality is seen in the wrist are. Osteoarthritis first carpometacarpal oint. There is a suggestion of tissue swelling in the proximal forearm or elbow region.   NO NEW EXAMS   LABS REVIEWED: PREVIOUS    05-16-16: hgb a1c 6.6 06-29-16: wbc 7.0; hgb 10.3; hct 31.7; mcv 89.0; plt 272; glucose 112; bun 21; creat 0.79; k+ 3.9; na++ 139; ca 9.4; liver normal albumin 3.0 blood culture: no growth; urine culture: multiple bacteria 06-30-16: wbc 13.6; hgb 10.3; hct 32.7; mcv 91.6; plt 218; glucose 104; bun 22; creat 0.83; k+ 3.7; na++ 142;ca 8.7; liver normal albumin 2.7; mag 1.6 07-01-16: wbc 10.7; hgb 9.2; hct 27.4; mcv 89.5; plt 223; glucose 204; bun 24; creat 0.83; k+ 3.7; na++ 139; mag 2.3  07-09-16: wbc 6.4; hgb 10.7; hct 35.1; mcv 95.6; plt 271; glucose 137; bun 17.1; creat 0.92; k+ 4.9; na++ 141; ca 9.9; liver normal albumin 3.1   07-22-16: glucose 110; bun 29.7; creat 0.74; k+ 5.3; na++ 144; ca 10.2  08-06-16: glucose 178; bun 22.2; creat 0.6; k+ 4.5; na++ 135; ca 9.3  08-29-16: wbc 9.2; hgb 10.4; hct 33.2; mcv 90.8; plt 339; uric acid 6.0   NO NEW LABS  Review of Systems  Unable to perform ROS: Other (cognitive impairment )    Physical Exam  Constitutional: No distress.  frail  Eyes: Conjunctivae are normal.  Neck: Neck supple. No JVD present. No thyromegaly present.  Cardiovascular: Normal rate, regular rhythm and intact distal pulses.   Respiratory: Effort normal and breath sounds normal. No respiratory distress. She has no wheezes.  GI: Soft. Bowel sounds are normal. She exhibits no distension. There is no tenderness.  Musculoskeletal: She exhibits no edema.  Has limited range of motions to upper extremities Does not move lower extremities   Lymphadenopathy:    She has no cervical adenopathy.  Neurological: She is alert.  Skin: Skin is warm and dry. She is not diaphoretic.  Right wrist is in splint  is able to move fingers.  Left posterior heel stage IV: 6.9 x 6.5 x 0.4 cm 65% necrotic 35% granulation : betadine wet to moist Right posterior heel stage IV: 2.1 x 4.8 x 0.2 cm 25% slough 75% granulation silver alginate   Right sacrum stage IV:   2.9 x 1.8 x 0.4 cm 100% granulation anasept wet to moist  Coccyx stage III: 1.8 x 1.8 x 0.2 cm 100% granulation medihoney Left lateral sacrum stage III: 1.5 x 3.2 x 0.1 cm 60% necrotic 20% granulation medihoney    ASSESSMENT/ PLAN:  Chronic kidney disease stage IlI; she is presently stable. Her family has been informed and has verbalized understanding.   MD is aware of resident's narcotic use and is in agreement with current plan of care. We will attempt to wean resident as apropriate   Synthia Innocent NP [facility]  Contact [phone] Monday through Friday 8am- 5pm  After hours call [phone]

## 2016-09-25 ENCOUNTER — Encounter: Payer: Self-pay | Admitting: Adult Health

## 2016-09-25 ENCOUNTER — Non-Acute Institutional Stay (SKILLED_NURSING_FACILITY): Payer: Medicare Other | Admitting: Adult Health

## 2016-09-25 DIAGNOSIS — G894 Chronic pain syndrome: Secondary | ICD-10-CM

## 2016-09-25 DIAGNOSIS — N183 Chronic kidney disease, stage 3 unspecified: Secondary | ICD-10-CM

## 2016-09-25 DIAGNOSIS — R627 Adult failure to thrive: Secondary | ICD-10-CM | POA: Diagnosis not present

## 2016-09-25 DIAGNOSIS — D638 Anemia in other chronic diseases classified elsewhere: Secondary | ICD-10-CM | POA: Diagnosis not present

## 2016-09-25 DIAGNOSIS — I509 Heart failure, unspecified: Secondary | ICD-10-CM | POA: Diagnosis not present

## 2016-09-25 NOTE — Progress Notes (Signed)
Location:   [facility] Room Number: 113 B Place of Service:  SNF (31)   CODE STATUS:  DNR  Allergies  Allergen Reactions  . Dicyclomine Other (See Comments)    Reaction:  Dizziness   . Gabapentin Other (See Comments)    Reaction:  Dizziness   . Metformin And Related Nausea And Vomiting  . Lyrica [Pregabalin] Other (See Comments)    Reaction:  Unknown  . Topiramate Other (See Comments)    Reaction:  Unknown    Chief Complaint  Patient presents with  . Medical Management of Chronic Issues    1 month follow up    HPI:  She is a 83 year old long term resident of this facility who is being seen for the management of her chronic illnesses: ckd stage III; anemia; djd; chf; ftt . She is unable to participate in the hpi or ros. She does have chronic ulcerations; she does have pain with turning and positioning; she does have po intake. There are no nursing concerns today   Past Medical History:  Diagnosis Date  . Anxiety disorder   . Arthritis   . Asthma   . Chronic kidney disease   . Degenerative disk disease   . Diabetes mellitus   . GERD (gastroesophageal reflux disease)   . Hypercholesteremia   . Hypertension   . IBS (irritable bowel syndrome)   . Sarcoidosis   . Sepsis (HCC) 06/29/2016  . Spinal stenosis     Past Surgical History:  Procedure Laterality Date  . EYE SURGERY  2017   cataract / glaucoma    Social History   Social History  . Marital status: Divorced    Spouse name: N/A  . Number of children: N/A  . Years of education: N/A   Occupational History  . Not on file.   Social History Main Topics  . Smoking status: Former Games developer  . Smokeless tobacco: Never Used  . Alcohol use No  . Drug use: No  . Sexual activity: Not on file   Other Topics Concern  . Not on file   Social History Narrative  . No narrative on file   Family History  Problem Relation Age of Onset  . Hypertension Other       VITAL SIGNS BP 98/68   Pulse 78    Temp 98.4 F (36.9 C)   Resp 16   Ht 5\' 4"  (1.626 m)   Wt 121 lb (54.9 kg)   SpO2 97%   BMI 20.77 kg/m   Patient's Medications  New Prescriptions   No medications on file  Previous Medications   ACETAMINOPHEN (TYLENOL) 500 MG TABLET    Take 2 tablets every 8 hours as needed for pain    ALBUTEROL (PROVENTIL HFA;VENTOLIN HFA) 108 (90 BASE) MCG/ACT INHALER    Inhale 2 puffs into the lungs every 6 (six) hours as needed for wheezing or shortness of breath.    ALBUTEROL (PROVENTIL) (2.5 MG/3ML) 0.083% NEBULIZER SOLUTION    Take 2.5 mg by nebulization every 6 (six) hours as needed for wheezing or shortness of breath.   ALLOPURINOL PO    Give 50 mg by mouth one time daily for gout   AMINO ACIDS-PROTEIN HYDROLYS (FEEDING SUPPLEMENT, PRO-STAT SUGAR FREE 64,) LIQD    Take 30 mLs by mouth 3 (three) times daily with meals.   ASPIRIN EC 81 MG TABLET    Take 81 mg by mouth daily.   BISACODYL (DULCOLAX) 10 MG SUPPOSITORY  Place 1 suppository (10 mg total) rectally daily as needed for moderate constipation.   CLONAZEPAM PO    Take 0.25 mg by mouth 3 (three) times daily as needed.   DICLOFENAC SODIUM (VOLTAREN) 1 % GEL    APPLY 2 GRAMS EXTERNALLY TO THE AFFECTED AREA FOUR TIMES DAILY AS NEEDED FOR PAIN   LIDOCAINE 4 % PTCH    Apply to lower back topically in AM one time a day for pain management.  Remove after 12 hours   MENTHOL, TOPICAL ANALGESIC, (BIOFREEZE) 4 % GEL    Apply topically. Apply to lower back topically every shift for back pain.  Apply to bilateral shoulders topically every day and night shift for pain management   MULTIPLE VITAMINS-MINERALS (DECUBI-VITE) CAPS    Take 1 capsule by mouth daily.   NUTRITIONAL SUPPLEMENT LIQD    House Supplement - Med Pass 120cc by mouth three times daily   NUTRITIONAL SUPPLEMENTS (NUTRITIONAL SUPPLEMENT PO)    House Supplement - Give Nutritional Treat by mouth with meals daily   OMEPRAZOLE (PRILOSEC) 40 MG CAPSULE    Take 40 mg by mouth daily.    OSTOMY  SUPPLIES (SKIN PREP SPRAY) MISC    Apply to toes topically two times a day for Blackened toes   TRAMADOL (ULTRAM) 50 MG TABLET    Take 50 mg by mouth every 6 (six) hours.    UNABLE TO FIND    HSG Regular Diet - HSG Puree texture, Regular consistency  Modified Medications   No medications on file  Discontinued Medications   COLLAGENASE (SANTYL) OINTMENT    Apply to Coccyx topically every day and night shift for Blackened Toes     SIGNIFICANT DIAGNOSTIC EXAMS    PREVIOUS:   06-29-16:right shoulder x-ray: Right shoulder arthropathy without acute process Osteopenia  06-29-16: chest x-ray: Chronic scarring in the apices bilaterally.  06-29-16: kub: Unremarkable bowel gas pattern; no free intra-abdominal air seen. Moderate to large amount of stool noted in the colon.  07-16-16: right lower extremity arterial doppler : no doppler evidence of stenosis limited exam  08-28-16: right wrist x-ray: possible nondisplaced scaphoid fracture   08-29-16: right wrist x-ray: (ED) 1. Possible lucency/nondisplaced fracture involving the mid distalscaphoid 2. Degenerative changes of the wrist.   08-30-16: left wrist x-ray: no acute bone or joint abnormality is seen in the wrist are. Osteoarthritis first carpometacarpal oint. There is a suggestion of tissue swelling in the proximal forearm or elbow region.   NO NEW EXAMS   LABS REVIEWED: PREVIOUS    05-16-16: hgb a1c 6.6 06-29-16: wbc 7.0; hgb 10.3; hct 31.7; mcv 89.0; plt 272; glucose 112; bun 21; creat 0.79; k+ 3.9; na++ 139; ca 9.4; liver normal albumin 3.0 blood culture: no growth; urine culture: multiple bacteria 06-30-16: wbc 13.6; hgb 10.3; hct 32.7; mcv 91.6; plt 218; glucose 104; bun 22; creat 0.83; k+ 3.7; na++ 142;ca 8.7; liver normal albumin 2.7; mag 1.6 07-01-16: wbc 10.7; hgb 9.2; hct 27.4; mcv 89.5; plt 223; glucose 204; bun 24; creat 0.83; k+ 3.7; na++ 139; mag 2.3  07-09-16: wbc 6.4; hgb 10.7; hct 35.1; mcv 95.6; plt 271; glucose 137; bun 17.1;  creat 0.92; k+ 4.9; na++ 141; ca 9.9; liver normal albumin 3.1  07-22-16: glucose 110; bun 29.7; creat 0.74; k+ 5.3; na++ 144; ca 10.2  08-06-16: glucose 178; bun 22.2; creat 0.6; k+ 4.5; na++ 135; ca 9.3  08-29-16: wbc 9.2; hgb 10.4; hct 33.2; mcv 90.8; plt 339; uric acid 6.0  NO NEW LABS   Review of Systems  Unable to perform ROS: Other (cognitive impairment )   Physical Exam  Constitutional: No distress.  frail  Eyes: Conjunctivae are normal.  Neck: Neck supple. No JVD present. No thyromegaly present.  Cardiovascular: Normal rate, regular rhythm and intact distal pulses.   Respiratory: Effort normal and breath sounds normal. No respiratory distress. She has no wheezes.  GI: Soft. Bowel sounds are normal. She exhibits no distension. There is no tenderness.  Musculoskeletal: She exhibits no edema.  Has limited range of motions to upper extremities Does not move lower extremities   Lymphadenopathy:    She has no cervical adenopathy.  Neurological: She is alert.  Skin: Skin is warm and dry. She is not diaphoretic.  Right wrist is in splint  is able to move fingers.  Left posterior heel stage IV: 6.9 x 6.5 x 0.4 cm 65% necrotic 35% granulation : betadine wet to moist Right posterior heel stage IV: 2.1 x 4.8 x 0.2 cm 25% slough 75% granulation silver alginate   Right sacrum stage IV:   2.9 x 1.8 x 0.4 cm 100% granulation anasept wet to moist  Coccyx stage III: 1.8 x 1.8 x 0.2 cm 100% granulation medihoney Left lateral sacrum stage III: 1.5 x 3.2 x 0.1 cm 60% necrotic 20% granulation medihoney    ASSESSMENT/ PLAN:   TODAY:  1. CKD stage III: stable  bun 22.2/creat 0.6  2. [place]: stable  hgb 10.4; will monitor   3. DJD has spinal stenosis; degenerative disc disease and chronic pain  : without change  no indications of pain present; has voltaren gel four times daily as needed is using biofreeze to back and bilateral shoulders twice daily    Will continue ultram 05 mg every 6 hours     4. CHF: EF 55-60% (03-17-16): is presently off all medications will monitor   5. FTT/ weight loss protein calorie malnutrition: her weight on 06-03-16 was 135; pounds: her current weight is 118 pounds. Her albumin is 2.7 will continue supplements per facility protocol  She has completed megace and remeron therapy.   PREVIOUS  6. Anxiety: will continue klonopin 0.25 mg three times daily prn  and will monitor her status.   7. Multiple skin ulcerations: will continue her current plan of care is followed by the wound dr.    8. Asthma: has history of sarcoidosis: will continue albuterol neb every 6 hours as needed or albuterol 1 or 2 puffs every 6 hours as needed.   9. Possible right wrist fracture: is to see orthopedics in one week.  Will continue to monitor her status  10. Gout: uric acid level is 6.0; will begin allopurinol 50 mg daily to reduce uric acid levels.      MD is aware of resident's narcotic use and is in agreement with current plan of care. We will attempt to wean resident as apropriate   Synthia Innocent NP [facility]  Contact [phone] Monday through Friday 8am- 5pm  After hours call [phone]

## 2016-09-28 NOTE — Progress Notes (Signed)
I was asked to see her for to see for her right wrist swelling. The brace was causing her swelling, the brace was loosened by the nursing supervisor  and the swelling improved.

## 2016-09-30 ENCOUNTER — Inpatient Hospital Stay (HOSPITAL_COMMUNITY)
Admission: EM | Admit: 2016-09-30 | Discharge: 2016-10-05 | DRG: 871 | Disposition: A | Discharge status: Skilled Nursing Facility | Payer: Medicare Other | Attending: Internal Medicine | Admitting: Internal Medicine

## 2016-09-30 ENCOUNTER — Emergency Department (HOSPITAL_COMMUNITY): Payer: Medicare Other

## 2016-09-30 ENCOUNTER — Inpatient Hospital Stay (HOSPITAL_COMMUNITY): Payer: Medicare Other

## 2016-09-30 ENCOUNTER — Encounter (HOSPITAL_COMMUNITY): Payer: Self-pay | Admitting: Emergency Medicine

## 2016-09-30 DIAGNOSIS — Z8249 Family history of ischemic heart disease and other diseases of the circulatory system: Secondary | ICD-10-CM | POA: Diagnosis not present

## 2016-09-30 DIAGNOSIS — M1990 Unspecified osteoarthritis, unspecified site: Secondary | ICD-10-CM | POA: Diagnosis present

## 2016-09-30 DIAGNOSIS — R109 Unspecified abdominal pain: Secondary | ICD-10-CM | POA: Diagnosis not present

## 2016-09-30 DIAGNOSIS — L89154 Pressure ulcer of sacral region, stage 4: Secondary | ICD-10-CM | POA: Diagnosis present

## 2016-09-30 DIAGNOSIS — N183 Chronic kidney disease, stage 3 unspecified: Secondary | ICD-10-CM | POA: Diagnosis present

## 2016-09-30 DIAGNOSIS — R55 Syncope and collapse: Secondary | ICD-10-CM | POA: Diagnosis not present

## 2016-09-30 DIAGNOSIS — G9341 Metabolic encephalopathy: Secondary | ICD-10-CM | POA: Diagnosis present

## 2016-09-30 DIAGNOSIS — Z9889 Other specified postprocedural states: Secondary | ICD-10-CM | POA: Diagnosis not present

## 2016-09-30 DIAGNOSIS — N390 Urinary tract infection, site not specified: Secondary | ICD-10-CM | POA: Diagnosis not present

## 2016-09-30 DIAGNOSIS — E1122 Type 2 diabetes mellitus with diabetic chronic kidney disease: Secondary | ICD-10-CM | POA: Diagnosis present

## 2016-09-30 DIAGNOSIS — D638 Anemia in other chronic diseases classified elsewhere: Secondary | ICD-10-CM | POA: Diagnosis present

## 2016-09-30 DIAGNOSIS — Z79899 Other long term (current) drug therapy: Secondary | ICD-10-CM | POA: Diagnosis not present

## 2016-09-30 DIAGNOSIS — L89614 Pressure ulcer of right heel, stage 4: Secondary | ICD-10-CM | POA: Diagnosis present

## 2016-09-30 DIAGNOSIS — R000 Tachycardia, unspecified: Secondary | ICD-10-CM | POA: Diagnosis present

## 2016-09-30 DIAGNOSIS — E43 Unspecified severe protein-calorie malnutrition: Secondary | ICD-10-CM | POA: Diagnosis present

## 2016-09-30 DIAGNOSIS — R10819 Abdominal tenderness, unspecified site: Secondary | ICD-10-CM

## 2016-09-30 DIAGNOSIS — K529 Noninfective gastroenteritis and colitis, unspecified: Secondary | ICD-10-CM | POA: Diagnosis present

## 2016-09-30 DIAGNOSIS — A419 Sepsis, unspecified organism: Principal | ICD-10-CM | POA: Diagnosis present

## 2016-09-30 DIAGNOSIS — I130 Hypertensive heart and chronic kidney disease with heart failure and stage 1 through stage 4 chronic kidney disease, or unspecified chronic kidney disease: Secondary | ICD-10-CM | POA: Diagnosis present

## 2016-09-30 DIAGNOSIS — M109 Gout, unspecified: Secondary | ICD-10-CM | POA: Diagnosis not present

## 2016-09-30 DIAGNOSIS — R4182 Altered mental status, unspecified: Secondary | ICD-10-CM | POA: Diagnosis not present

## 2016-09-30 DIAGNOSIS — R6520 Severe sepsis without septic shock: Secondary | ICD-10-CM | POA: Diagnosis not present

## 2016-09-30 DIAGNOSIS — R402421 Glasgow coma scale score 9-12, in the field [EMT or ambulance]: Secondary | ICD-10-CM | POA: Diagnosis not present

## 2016-09-30 DIAGNOSIS — E876 Hypokalemia: Secondary | ICD-10-CM | POA: Diagnosis present

## 2016-09-30 DIAGNOSIS — K219 Gastro-esophageal reflux disease without esophagitis: Secondary | ICD-10-CM | POA: Diagnosis present

## 2016-09-30 DIAGNOSIS — J45909 Unspecified asthma, uncomplicated: Secondary | ICD-10-CM | POA: Diagnosis present

## 2016-09-30 DIAGNOSIS — T502X5A Adverse effect of carbonic-anhydrase inhibitors, benzothiadiazides and other diuretics, initial encounter: Secondary | ICD-10-CM | POA: Diagnosis present

## 2016-09-30 DIAGNOSIS — Z888 Allergy status to other drugs, medicaments and biological substances status: Secondary | ICD-10-CM | POA: Diagnosis not present

## 2016-09-30 DIAGNOSIS — R4189 Other symptoms and signs involving cognitive functions and awareness: Secondary | ICD-10-CM | POA: Diagnosis present

## 2016-09-30 DIAGNOSIS — F0280 Dementia in other diseases classified elsewhere without behavioral disturbance: Secondary | ICD-10-CM | POA: Diagnosis present

## 2016-09-30 DIAGNOSIS — R9431 Abnormal electrocardiogram [ECG] [EKG]: Secondary | ICD-10-CM | POA: Diagnosis not present

## 2016-09-30 DIAGNOSIS — I5032 Chronic diastolic (congestive) heart failure: Secondary | ICD-10-CM | POA: Diagnosis present

## 2016-09-30 DIAGNOSIS — N179 Acute kidney failure, unspecified: Secondary | ICD-10-CM

## 2016-09-30 DIAGNOSIS — R1312 Dysphagia, oropharyngeal phase: Secondary | ICD-10-CM | POA: Diagnosis not present

## 2016-09-30 DIAGNOSIS — Z6820 Body mass index (BMI) 20.0-20.9, adult: Secondary | ICD-10-CM | POA: Diagnosis not present

## 2016-09-30 DIAGNOSIS — M6281 Muscle weakness (generalized): Secondary | ICD-10-CM | POA: Diagnosis not present

## 2016-09-30 DIAGNOSIS — R488 Other symbolic dysfunctions: Secondary | ICD-10-CM | POA: Diagnosis not present

## 2016-09-30 LAB — URINALYSIS, ROUTINE W REFLEX MICROSCOPIC
Glucose, UA: NEGATIVE mg/dL
HGB URINE DIPSTICK: NEGATIVE
Ketones, ur: NEGATIVE mg/dL
Nitrite: NEGATIVE
SPECIFIC GRAVITY, URINE: 1.026 (ref 1.005–1.030)
Squamous Epithelial / LPF: NONE SEEN
pH: 8 (ref 5.0–8.0)

## 2016-09-30 LAB — CBC WITH DIFFERENTIAL/PLATELET
Basophils Absolute: 0 10*3/uL (ref 0.0–0.1)
Basophils Relative: 0 %
Eosinophils Absolute: 0 10*3/uL (ref 0.0–0.7)
Eosinophils Relative: 0 %
HCT: 28.9 % — ABNORMAL LOW (ref 36.0–46.0)
Hemoglobin: 9.5 g/dL — ABNORMAL LOW (ref 12.0–15.0)
LYMPHS ABS: 0.8 10*3/uL (ref 0.7–4.0)
Lymphocytes Relative: 5 %
MCH: 26.5 pg (ref 26.0–34.0)
MCHC: 32.9 g/dL (ref 30.0–36.0)
MCV: 80.7 fL (ref 78.0–100.0)
MONOS PCT: 10 %
Monocytes Absolute: 1.4 10*3/uL — ABNORMAL HIGH (ref 0.1–1.0)
NEUTROS ABS: 12 10*3/uL — AB (ref 1.7–7.7)
NEUTROS PCT: 85 %
Platelets: 446 10*3/uL — ABNORMAL HIGH (ref 150–400)
RBC: 3.58 MIL/uL — ABNORMAL LOW (ref 3.87–5.11)
RDW: 17.4 % — AB (ref 11.5–15.5)
WBC: 14.1 10*3/uL — ABNORMAL HIGH (ref 4.0–10.5)

## 2016-09-30 LAB — COMPREHENSIVE METABOLIC PANEL
ALBUMIN: 2.1 g/dL — AB (ref 3.5–5.0)
ALT: 7 U/L — ABNORMAL LOW (ref 14–54)
ANION GAP: 12 (ref 5–15)
AST: 12 U/L — ABNORMAL LOW (ref 15–41)
Alkaline Phosphatase: 65 U/L (ref 38–126)
BUN: 65 mg/dL — ABNORMAL HIGH (ref 6–20)
CO2: 25 mmol/L (ref 22–32)
Calcium: 8.2 mg/dL — ABNORMAL LOW (ref 8.9–10.3)
Chloride: 105 mmol/L (ref 101–111)
Creatinine, Ser: 3.36 mg/dL — ABNORMAL HIGH (ref 0.44–1.00)
GFR calc Af Amer: 13 mL/min — ABNORMAL LOW (ref >60)
GFR calc non Af Amer: 11 mL/min — ABNORMAL LOW (ref >60)
GLUCOSE: 251 mg/dL — AB (ref 65–99)
POTASSIUM: 5 mmol/L (ref 3.5–5.1)
SODIUM: 142 mmol/L (ref 135–145)
Total Bilirubin: 1.6 mg/dL — ABNORMAL HIGH (ref 0.3–1.2)
Total Protein: 6.1 g/dL — ABNORMAL LOW (ref 6.5–8.1)

## 2016-09-30 LAB — PROTIME-INR
INR: 1.73
PROTHROMBIN TIME: 20.5 s — AB (ref 11.4–15.2)

## 2016-09-30 LAB — I-STAT CG4 LACTIC ACID, ED: Lactic Acid, Venous: 1.38 mmol/L (ref 0.5–1.9)

## 2016-09-30 MED ORDER — SODIUM CHLORIDE 0.9 % IV SOLN
INTRAVENOUS | Status: DC
  Administered 2016-09-30 – 2016-10-05 (×4): via INTRAVENOUS

## 2016-09-30 MED ORDER — ONDANSETRON HCL 4 MG/2ML IJ SOLN: 4.0000 mg | Freq: Four times a day (QID) | INTRAMUSCULAR | Status: DC | PRN

## 2016-09-30 MED ORDER — ACETAMINOPHEN 325 MG PO TABS
650.0000 mg | ORAL_TABLET | Freq: Four times a day (QID) | ORAL | Status: DC | PRN
  Administered 2016-10-02 – 2016-10-04 (×3): 650 mg via ORAL
  Filled 2016-09-30 (×3): qty 2

## 2016-09-30 MED ORDER — PIPERACILLIN-TAZOBACTAM IN DEX 2-0.25 GM/50ML IV SOLN
2.2500 g | Freq: Three times a day (TID) | INTRAVENOUS | Status: DC
  Administered 2016-09-30 – 2016-10-02 (×5): 2.25 g via INTRAVENOUS
  Filled 2016-09-30 (×6): qty 50

## 2016-09-30 MED ORDER — ACETAMINOPHEN 650 MG RE SUPP: 650.0000 mg | Freq: Four times a day (QID) | RECTAL | Status: DC | PRN

## 2016-09-30 MED ORDER — SODIUM CHLORIDE 0.9% FLUSH
3.0000 mL | Freq: Two times a day (BID) | INTRAVENOUS | Status: DC
  Administered 2016-10-01 – 2016-10-04 (×4): 3 mL via INTRAVENOUS

## 2016-09-30 MED ORDER — ONDANSETRON HCL 4 MG PO TABS: 4.0000 mg | ORAL_TABLET | Freq: Four times a day (QID) | ORAL | Status: DC | PRN

## 2016-09-30 MED ORDER — PIPERACILLIN-TAZOBACTAM 3.375 G IVPB 30 MIN
3.3750 g | Freq: Once | INTRAVENOUS | Status: AC
  Administered 2016-09-30: 3.375 g via INTRAVENOUS
  Filled 2016-09-30: qty 50

## 2016-09-30 MED ORDER — SODIUM CHLORIDE 0.9 % IV BOLUS (SEPSIS)
500.0000 mL | Freq: Once | INTRAVENOUS | Status: AC
  Administered 2016-09-30: 500 mL via INTRAVENOUS

## 2016-09-30 MED ORDER — SODIUM CHLORIDE 0.9 % IV BOLUS (SEPSIS)
250.0000 mL | Freq: Once | INTRAVENOUS | Status: AC
  Administered 2016-09-30: 250 mL via INTRAVENOUS

## 2016-09-30 MED ORDER — SODIUM CHLORIDE 0.9 % IV BOLUS (SEPSIS)
1000.0000 mL | Freq: Once | INTRAVENOUS | Status: AC
  Administered 2016-09-30: 1000 mL via INTRAVENOUS

## 2016-09-30 MED ORDER — VANCOMYCIN HCL IN DEXTROSE 1-5 GM/200ML-% IV SOLN
1000.0000 mg | Freq: Once | INTRAVENOUS | Status: AC
  Administered 2016-09-30: 1000 mg via INTRAVENOUS
  Filled 2016-09-30: qty 200

## 2016-09-30 MED ORDER — HEPARIN SODIUM (PORCINE) 5000 UNIT/ML IJ SOLN
5000.0000 [IU] | Freq: Three times a day (TID) | INTRAMUSCULAR | Status: DC
  Administered 2016-09-30 – 2016-10-05 (×15): 5000 [IU] via SUBCUTANEOUS
  Filled 2016-09-30 (×16): qty 1

## 2016-09-30 NOTE — ED Provider Notes (Signed)
[facility]-EMERGENCY DEPT Provider Note   CSN: [id number] Arrival date & time: 09/30/16  1140     History   Chief Complaint Chief Complaint  Patient presents with  . Altered Mental Status    HPI Elizabeth Keller is a 83 y.o. female.  83 year old female with history of dementia from nursing home here with altered mental status.Felt warm to the touch according to the facility and she also has a history of sepsis in the past. Has chronic indwelling catheter as well as history of decubiti. She is normally verbal and according to the family has had decreased verbal expression. History is per EMS as well as the old chart. Sent here for evaluation      Past Medical History:  Diagnosis Date  . Anxiety disorder   . Arthritis   . Asthma   . Chronic kidney disease   . Degenerative disk disease   . Diabetes mellitus   . GERD (gastroesophageal reflux disease)   . Hypercholesteremia   . Hypertension   . IBS (irritable bowel syndrome)   . Sarcoidosis   . Sepsis (HCC) 06/29/2016  . Spinal stenosis     Patient Active Problem List   Diagnosis Date Noted  . Osteoarthritis of multiple joints 09/03/2016  . Right wrist fracture, sequela 09/03/2016  . Swelling of left hand 08/31/2016  . Cognitive impairment 08/14/2016  . Anemia, chronic disease 08/14/2016  . GERD (gastroesophageal reflux disease) 08/13/2016  . Stage IV pressure ulcer of sacral region (HCC) 07/08/2016  . Decubitus ulcer of heel, bilateral, unstageable (HCC) 07/08/2016  . Dyslipidemia associated with type 2 diabetes mellitus (HCC) 07/08/2016  . Failure to thrive in adult 07/08/2016  . Weight loss, non-intentional 07/08/2016  . Anxiety disorder 07/08/2016  . Severe protein-calorie malnutrition (HCC) 06/30/2016  . Sepsis (HCC) 06/29/2016  . Severe sepsis (HCC) 05/15/2016  . Type II diabetes mellitus with renal manifestations (HCC) 04/04/2016  . Chronic kidney disease, stage III (moderate) 03/14/2016    Past Surgical  History:  Procedure Laterality Date  . EYE SURGERY  2017   cataract / glaucoma    OB History    No data available       Home Medications    Prior to Admission medications   Medication Sig Start Date End Date Taking? Authorizing Provider  acetaminophen (TYLENOL) 500 MG tablet Take 2 tablets every 8 hours as needed for pain     [provider]  albuterol (PROVENTIL HFA;VENTOLIN HFA) 108 (90 Base) MCG/ACT inhaler Inhale 2 puffs into the lungs every 6 (six) hours as needed for wheezing or shortness of breath.     [provider]  albuterol (PROVENTIL) (2.5 MG/3ML) 0.083% nebulizer solution Take 2.5 mg by nebulization every 6 (six) hours as needed for wheezing or shortness of breath.    [provider]  ALLOPURINOL PO Give 50 mg by mouth one time daily for gout    [provider]  Amino Acids-Protein Hydrolys (FEEDING SUPPLEMENT, PRO-STAT SUGAR FREE 64,) LIQD Take 30 mLs by mouth 3 (three) times daily with meals.    [provider]  aspirin EC 81 MG tablet Take 81 mg by mouth daily.    [provider]  bisacodyl (DULCOLAX) 10 MG suppository Place 1 suppository (10 mg total) rectally daily as needed for moderate constipation. 05/20/16   Sheikh, Omair Latif, DO  CLONAZEPAM PO Take 0.25 mg by mouth 3 (three) times daily as needed.    [provider]  diclofenac sodium (VOLTAREN) 1 %  GEL APPLY 2 GRAMS EXTERNALLY TO THE AFFECTED AREA FOUR TIMES DAILY AS NEEDED FOR PAIN 06/26/16   Sandford Craze, NP  Lidocaine 4 % PTCH Apply to lower back topically in AM one time a day for pain management.  Remove after 12 hours    [provider]  Menthol, Topical Analgesic, (BIOFREEZE) 4 % GEL Apply topically. Apply to lower back topically every shift for back pain.  Apply to bilateral shoulders topically every day and night shift for pain management    [provider]  Multiple Vitamins-Minerals (DECUBI-VITE) CAPS Take 1 capsule by  mouth daily.    [provider]  NUTRITIONAL SUPPLEMENT LIQD House Supplement - Med Pass 120cc by mouth three times daily    [provider]  Nutritional Supplements (NUTRITIONAL SUPPLEMENT PO) House Supplement - Give Nutritional Treat by mouth with meals daily    [provider]  omeprazole (PRILOSEC) 40 MG capsule Take 40 mg by mouth daily.     [provider]  Ostomy Supplies (SKIN PREP SPRAY) MISC Apply to toes topically two times a day for Blackened toes    [provider]  traMADol (ULTRAM) 50 MG tablet Take 50 mg by mouth every 6 (six) hours.     [provider]  UNABLE TO FIND HSG Regular Diet - HSG Puree texture, Regular consistency    [provider]    Family History Family History  Problem Relation Age of Onset  . Hypertension Other     Social History Social History  Substance Use Topics  . Smoking status: Former Games developer  . Smokeless tobacco: Never Used  . Alcohol use No     Allergies   Dicyclomine; Gabapentin; Metformin and related; Lyrica [pregabalin]; and Topiramate   Review of Systems Review of Systems  Unable to perform ROS: Dementia     Physical Exam Updated Vital Signs BP 124/69 (BP Location: Left Arm)   Pulse (!) 116   Temp 98.6 F (37 C) (Axillary)   Resp 18   Ht 1.626 m (5\' 4" ) Comment: last documented 5 days ago  SpO2 95%   Physical Exam  Constitutional: She appears well-developed and well-nourished. She appears lethargic.  Non-toxic appearance. No distress.  HENT:  Head: Normocephalic and atraumatic.  Eyes: Pupils are equal, round, and reactive to light. Conjunctivae, EOM and lids are normal.  Neck: Normal range of motion. Neck supple. No tracheal deviation present. No thyroid mass present.  Cardiovascular: Regular rhythm and normal heart sounds.  Tachycardia present.  Exam reveals no gallop.   No murmur heard. Pulmonary/Chest: Effort normal and breath sounds normal. No stridor.  No respiratory distress. She has no decreased breath sounds. She has no wheezes. She has no rhonchi. She has no rales.  Abdominal: Soft. Normal appearance and bowel sounds are normal. She exhibits no distension. There is no tenderness. There is no rebound and no CVA tenderness.  Musculoskeletal: Normal range of motion. She exhibits no edema or tenderness.       Back:  Neurological: She appears lethargic. She is disoriented. She displays atrophy. No sensory deficit. GCS eye subscore is 3. GCS verbal subscore is 4. GCS motor subscore is 5.  Skin: Skin is warm and dry. No abrasion and no rash noted.  Psychiatric: Her affect is blunt. Her speech is tangential. She is slowed. She is inattentive.  Nursing note and vitals reviewed.    ED Treatments / Results  Labs (all labs ordered are listed, but only abnormal results are  displayed) Labs Reviewed  CULTURE, BLOOD (ROUTINE X 2)  CULTURE, BLOOD (ROUTINE X 2)  COMPREHENSIVE METABOLIC PANEL  CBC WITH DIFFERENTIAL/PLATELET  PROTIME-INR  URINALYSIS, ROUTINE W REFLEX MICROSCOPIC  I-STAT CG4 LACTIC ACID, ED  I-STAT CG4 LACTIC ACID, ED    EKG  EKG Interpretation  Date/Time:  Wednesday September 30 2016 13:39:27 EDT Ventricular Rate:  108 PR Interval:    QRS Duration: 168 QT Interval:  409 QTC Calculation: 549 R Axis:   -87 Text Interpretation:  Age not entered, assumed to be  83 years old for purpose of ECG interpretation Sinus tachycardia RBBB and LAFB Abnrm T, consider ischemia, anterolateral lds Baseline wander in lead(s) II III aVF Confirmed by Lorre Nick ([id number]) on 09/30/2016 2:00:10 PM       Radiology No results found.  Procedures Procedures (including critical care time)  Medications Ordered in ED Medications  sodium chloride 0.9 % bolus 1,000 mL (not administered)    And  sodium chloride 0.9 % bolus 500 mL (not administered)    And  sodium chloride 0.9 % bolus 250 mL (not administered)  piperacillin-tazobactam (ZOSYN)  IVPB 3.375 g (not administered)  vancomycin (VANCOCIN) IVPB 1000 mg/200 mL premix (not administered)     Initial Impression / Assessment and Plan / ED Course  I have reviewed the triage vital signs and the nursing notes.  Pertinent labs & imaging results that were available during my care of the patient were reviewed by me and considered in my medical decision making (see chart for details).     Level  sepsis alert initiated. Patient given IV fluids as well as IV antibiotics prior to lab results. Patient has evidence of AKI on her BASIC metabolic panel. Chest x-ray without infiltrate,  urinalysis shows infection.patient is urine is the likely source of her current condition. Discussed with hospitalist will admit the patient   CRITICAL CARE Performed by: Toy Baker Total critical care time: 50 minutes Critical care time was exclusive of separately billable procedures and treating other patients. Critical care was necessary to treat or prevent imminent or life-threatening deterioration. Critical care was time spent personally by me on the following activities: development of treatment plan with patient and/or surrogate as well as nursing, discussions with consultants, evaluation of patient's response to treatment, examination of patient, obtaining history from patient or surrogate, ordering and performing treatments and interventions, ordering and review of laboratory studies, ordering and review of radiographic studies, pulse oximetry and re-evaluation of patient's condition.   Final Clinical Impressions(s) / ED Diagnoses   Final diagnoses:  None    New Prescriptions New Prescriptions   No medications on file     Lorre Nick, MD 09/30/16 1415

## 2016-09-30 NOTE — Progress Notes (Signed)
A consult was received from an ED physician for Vancomycin and Zosyn per pharmacy dosing.  The patient's profile has been reviewed for ht/wt/allergies/indication/available labs.    A one time order has already been placed by MD for Vancomycin 1g IV and Zosyn 3.375g IV (30 minute infusion).  Further antibiotics/pharmacy consults should be ordered by admitting physician if indicated.                       Thank you,  Greer Pickerel, PharmD, BCPS Pager: [phone] 09/30/2016 12:45 PM

## 2016-09-30 NOTE — ED Notes (Signed)
Call report to [facility] at [phone] at [phone]

## 2016-09-30 NOTE — ED Triage Notes (Signed)
Per EMS pt from [facility] sent for evaluation related to AMS; pt nonverbal at present and responsive to painful stimuli only; per son normal verbal. Pt has indwelling catheter and decubitus. Hx of sepsis.

## 2016-09-30 NOTE — Progress Notes (Signed)
Pharmacy Antibiotic Note  Elizabeth Keller is a 83 y.o. female admitted on 09/30/2016 with sepsis.  Pharmacy has been consulted for vancomycin and Zosyn dosing. Noted to have AKI with SCr 3.36 (0.6 on 08/06/16). Patient has received Vancomycin 1g and Zosyn 3.375g in ED at 12:49 today.  Plan: Continue with Zosyn 2.25g IV q8h for CrCl < 20 ml/min Check SCr in AM (expect improvement with IV hydration) to determine vancomycin maintenance dose Follow up culture data and narrow antibiotics as appropriate Recommend checking MRSA PCR nasal swab and consider d/c vancomycin if negative  Height: 5\' 4"  (162.6 cm) (last documented 5 days ago) Weight:  (last documented 5 days ago) IBW/kg (Calculated) : 54.7  Temp (24hrs), Avg:98.6 F (37 C), Min:98.6 F (37 C), Max:98.6 F (37 C)   Recent Labs Lab 09/30/16 1234 09/30/16 1238  WBC 14.1*  --   CREATININE 3.36*  --   LATICACIDVEN  --  1.38    Estimated Creatinine Clearance: 9.4 mL/min (A) (by C-G formula based on SCr of 3.36 mg/dL (H)).    Allergies  Allergen Reactions  . Dicyclomine Other (See Comments)    Reaction:  Dizziness   . Gabapentin Other (See Comments)    Reaction:  Dizziness   . Metformin And Related Nausea And Vomiting  . Lyrica [Pregabalin] Other (See Comments)    Reaction:  Unknown  . Topiramate Other (See Comments)    Reaction:  Unknown    Antimicrobials this admission:  8/22 Vanc >> 8/22 Zosyn >>  Dose adjustments this admission:    Microbiology results:  8/22 BCx: sent 8/22 UA: pyruria, many bacteria  Thank you for allowing pharmacy to be a part of this patient's care.  Loralee Pacas, PharmD, BCPS Pager: [phone] 09/30/2016 3:07 PM

## 2016-09-30 NOTE — H&P (Signed)
[facility] History and Physical  [name]:[id number] DOB: Mar 10, 1923 DOA: 09/30/2016   PCP: Sandford Craze, NP  Specialists: None  Chief Complaint: Not her usual self at the skilled nursing facility  HPI: Elizabeth Keller is a 83 y.o. female with a past medical history of asthma, cognitive impairment, who lives in a skilled nursing facility and who also has a stage IV sacral decubitus. Patient has been hospitalized 4 times already this year. This will be her fifth hospitalization. She was apparently sent over from the skilled nursing facility as she was not her usual self. She wasn't responding to family members. She has had poor oral intake over the past many days according to her son. She apparently also felt warm to touch. No mention of nausea, vomiting. Patient has not able to answer questions. She is responsive, however. So no history was available from her.  In the emergency department, patient was found to have leukocytosis, she was noted to be tachycardic. However, she was afebrile. UA was noted to be abnormal. Patient does have a chronic indwelling Foley catheter. She was, however, also found to have acute kidney failure. She was hospitalized for further management.  Home Medications: Prior to Admission medications   Medication Sig Start Date End Date Taking? Authorizing Provider  acetaminophen (TYLENOL) 500 MG tablet Take 1,000 mg by mouth every 8 (eight) hours as needed for moderate pain.    Yes [provider]  albuterol (PROVENTIL HFA;VENTOLIN HFA) 108 (90 Base) MCG/ACT inhaler Inhale 2 puffs into the lungs every 6 (six) hours as needed for wheezing or shortness of breath.    Yes [provider]  albuterol (PROVENTIL) (2.5 MG/3ML) 0.083% nebulizer solution Take 2.5 mg by nebulization every 6 (six) hours as needed for wheezing or shortness of breath.   Yes [provider]  allopurinol (ZYLOPRIM) 100 MG tablet Take 50 mg by mouth daily.    Yes [provider]  Amino Acids-Protein Hydrolys (FEEDING SUPPLEMENT, PRO-STAT SUGAR FREE 64,) LIQD Take 30 mLs by mouth 3 (three) times daily with meals.   Yes [provider]  aspirin 81 MG tablet Take 81 mg by mouth daily.   Yes [provider]  bisacodyl (DULCOLAX) 10 MG suppository Place 1 suppository (10 mg total) rectally daily as needed for moderate constipation. 05/20/16  Yes Sheikh, Omair Latif, DO  clonazePAM (KLONOPIN) 0.5 MG tablet Take 0.25 mg by mouth 3 (three) times daily.   Yes [provider]  diclofenac sodium (VOLTAREN) 1 % GEL APPLY 2 GRAMS EXTERNALLY TO THE AFFECTED AREA FOUR TIMES DAILY AS NEEDED FOR PAIN 06/26/16  Yes Sandford Craze, NP  Lidocaine 4 % PTCH Apply 1 application topically daily. Apply to lower back topically in AM one time a day for pain management.  Remove after 12 hours    Yes [provider]  Menthol, Topical Analgesic, (BIOFREEZE) 4 % GEL Apply 1 application topically 2 (two) times daily. Apply to lower back topically every shift for back pain   Yes [provider]  Multiple Vitamins-Minerals (DECUBI-VITE) CAPS Take 1 capsule by mouth daily.   Yes [provider]  NUTRITIONAL SUPPLEMENT LIQD Take 120 mLs by mouth 3 (three) times daily. *Med Pass*   Yes [provider]  Nutritional Supplements (NUTRITIONAL SUPPLEMENT PO) Take 1 Can by mouth 3 (three) times daily with meals. *House Supplement - Nutritional Treat*   Yes [provider]  omeprazole (PRILOSEC) 40 MG capsule Take 40 mg by mouth daily.  Yes [provider]  Ostomy Supplies (SKIN PREP SPRAY) MISC Apply 1 application topically 2 (two) times daily. Apply to toes topically two times a day for Blackened toes    Yes [provider]  traMADol (ULTRAM) 50 MG tablet Take 50 mg by mouth every 6 (six) hours.    Yes [provider]    Allergies:  Allergies  Allergen Reactions  . Dicyclomine Other  (See Comments)    Reaction:  Dizziness   . Gabapentin Other (See Comments)    Reaction:  Dizziness   . Metformin And Related Nausea And Vomiting  . Lyrica [Pregabalin] Other (See Comments)    Reaction:  Unknown  . Topiramate Other (See Comments)    Reaction:  Unknown    Past Medical History: Past Medical History:  Diagnosis Date  . Anxiety disorder   . Arthritis   . Asthma   . Chronic kidney disease   . Degenerative disk disease   . Diabetes mellitus   . GERD (gastroesophageal reflux disease)   . Hypercholesteremia   . Hypertension   . IBS (irritable bowel syndrome)   . Sarcoidosis   . Sepsis (HCC) 06/29/2016  . Spinal stenosis     Past Surgical History:  Procedure Laterality Date  . EYE SURGERY  2017   cataract / glaucoma    Social History: Patient lives in a skilled nursing facility. No other information is available from her at this time due to her encephalopathy.   Family History:  Family History  Problem Relation Age of Onset  . Hypertension Other      Review of Systems - Unable to do due to her encephalopathy  Physical Examination  Vitals:   09/30/16 1156  BP: 124/69  Pulse: (!) 116  Resp: 18  Temp: 98.6 F (37 C)  TempSrc: Axillary  SpO2: 95%  Height: 5\' 4"  (1.626 m)    BP 124/69 (BP Location: Left Arm)   Pulse (!) 116   Temp 98.6 F (37 C) (Axillary)   Resp 18   Ht 5\' 4"  (1.626 m) Comment: last documented 5 days ago  SpO2 95%   General appearance: appears stated age, distracted, no distress and uncooperative Head: Normocephalic, without obvious abnormality, atraumatic Eyes: Not very cooperative with eye examination. She does not open them. Throat: Extremely dry mucous membranes. Neck: no adenopathy, no carotid bruit, no JVD, supple, symmetrical, trachea midline and thyroid not enlarged, symmetric, no tenderness/mass/nodules Back: Stage IV sacral decubitus noted with yellowish exudate. Some foul smell appreciated. Resp: clear to  auscultation bilaterally Cardio: regular rate and rhythm, S1, S2 normal, no murmur, click, rub or gallop GI: Abdomen soft. Slightly tender over the lower abdomen but without any rebound or rigidity. Some guarding was appreciated. Extremities: Dressing noted over the right lower extremity Pulses: Poorly palpable in the lower extremities Skin: Sacral decubitus stage IV Lymph nodes: Cervical, supraclavicular, and axillary nodes normal. Neurologic: Responds to voice but does not answer any questions. Does not follow commands.    Labs on Admission: I have personally reviewed following labs and imaging studies  CBC:  Recent Labs Lab 09/30/16 1234  WBC 14.1*  NEUTROABS 12.0*  HGB 9.5*  HCT 28.9*  MCV 80.7  PLT 446*   Basic Metabolic Panel:  Recent Labs Lab 09/30/16 1234  NA 142  K 5.0  CL 105  CO2 25  GLUCOSE 251*  BUN 65*  CREATININE 3.36*  CALCIUM 8.2*   GFR: Estimated Creatinine Clearance: 9.4 mL/min (A) (by C-G  formula based on SCr of 3.36 mg/dL (H)).  Liver Function Tests:  Recent Labs Lab 09/30/16 1234  AST 12*  ALT 7*  ALKPHOS 65  BILITOT 1.6*  PROT 6.1*  ALBUMIN 2.1*   Coagulation Profile:  Recent Labs Lab 09/30/16 1234  INR 1.73     Radiological Exams on Admission: Dg Chest Port 1 View  Result Date: 09/30/2016 CLINICAL DATA:  Altered mental status.  Unresponsive. EXAM: PORTABLE CHEST 1 VIEW COMPARISON:  06/29/2016 FINDINGS: Heart size is normal. Chronic aortic atherosclerosis. Chronic pleural and parenchymal scarring in the upper chest with chronic volume loss. No sign of active infiltrate, collapse or effusion. No acute bone finding. IMPRESSION: Chronic lung disease with upper lobe retraction. No active process identified. Electronically Signed   By: Paulina Fusi M.D.   On: 09/30/2016 12:47    My interpretation of Electrocardiogram: Sinus tachycardia at 110 bpm. , Right bundle-branch block is noted. No concerning ST or T-wave changes  noted.   Problem List  Principal Problem:   Sepsis (HCC) Active Problems:   Chronic kidney disease, stage III (moderate)   Severe protein-calorie malnutrition (HCC)   Stage IV pressure ulcer of sacral region [facility])   Cognitive impairment   Anemia, chronic disease   ARF (acute renal failure) (HCC)   Assessment: This is a 83 year old African-American female with past medical history as stated earlier, who lives in a skilled nursing facility and was brought in due to being poorly responsive over the past 2-3 days. She has had poor oral intake. She is noted to have acute renal failure. She is also found to have leukocytosis and abnormal UA and possible infected sacral decubitus.  Plan: #1 Acute metabolic encephalopathy: Most likely due to renal failure. Sepsis could also be contributing. No focal deficits appreciated. No need for any neuro imaging studies at this time. Monitor closely.  #2 Sepsis, possibly due to UTI or infected sacral decubitus: Sepsis protocol will be initiated. Lactic acid level is normal. She'll be given IV fluids. Broad spectrum coverage for now with vancomycin and Zosyn. Follow up on culture data. Since she did have some tenderness in the lower abdomen, we will get abdominal films.  #3 acute renal failure on chronic kidney disease, stage III: Creatinine was 0.6, when last checked, which was in June. ARF is most likely due to poor oral intake. Sepsis could also be contributing. Continue IV fluids. Monitor renal function and urine output closely. If renal function does not prove, then we will consider imaging studies.  #4 chronic indwelling Foley catheter: Per family, this has been in since February. Unclear when it was replaced last. If not done recently, foley catheter will need to be changed out.  #5 Sacral decubitus stage IV, lower extremity wounds: Wound care RN has been consulted. When she was last hospitalized she required hydrotherapy.  #6 Severe protein calorie  malnutrition: Initiate diet when her encephalopathy is better. Swallow evaluation.  #7 Anemia of chronic disease: Hemoglobin close to baseline. Continue to monitor. There is likely an element of hemoconcentration due to her volume depleted status. Hemoglobin might drop some.  #8 Goals of care: Patient did have a MOST form. She is a limited code. This was verified with patient's son. There still seems to be some confusion regarding her overall goals of care. Patient was seen by palliative medicine during her previous hospitalization. May have to revisit depending on how she does over the next 2-3 days.   DVT Prophylaxis: Subcutaneous heparin Code Status: Limited  code Family Communication: Discussed with patient's son  Consults called: None for now   Severity of Illness: The appropriate patient status for this patient is INPATIENT. Inpatient status is judged to be reasonable and necessary in order to provide the required intensity of service to ensure the patient's safety. The patient's presenting symptoms, physical exam findings, and initial radiographic and laboratory data in the context of their chronic comorbidities is felt to place them at high risk for further clinical deterioration. Furthermore, it is not anticipated that the patient will be medically stable for discharge from the hospital within 2 midnights of admission. The following factors support the patient status of inpatient.   " The patient's presenting symptoms include poor oral intake, confusion. " The worrisome physical exam findings include tachycardia. " The initial radiographic and laboratory data are worrisome because of acute renal failure. " The chronic co-morbidities include anemia.   * I certify that at the point of admission it is my clinical judgment that the patient will require inpatient hospital care spanning beyond 2 midnights from the point of admission due to high intensity of service, high risk for further  deterioration and high frequency of surveillance required.*  Further management decisions will depend on results of further testing and patient's response to treatment.   [facility]  [facility] Pager [phone]  If 7PM-7AM, please contact night-coverage [email or web address] Password [facility]  09/30/2016, 2:46 PM

## 2016-09-30 NOTE — Progress Notes (Signed)
Upon arrival to the unit from ED, ED RN stated that patient had not voided through her foley all day except for 11ml of urine.  Patient's bladder scan showed over .  Order received from Dr. Rito Ehrlich to exchange foley.  Immediate return of over of tan/brown urine full of sediment and pink sludge which is trickling down the foley tube.  UC has been collected and sent. Dr. Rito Ehrlich aware.

## 2016-09-30 NOTE — ED Notes (Signed)
Bed: XI50 Expected date:  Expected time:  Means of arrival:  Comments: EMS sepsis

## 2016-09-30 NOTE — Progress Notes (Signed)
Wounds have been cleansed with NS.  Heels wounds have been redressed with wet to dry.  Sacral wound was cleansed with gauze and NS.

## 2016-10-01 ENCOUNTER — Inpatient Hospital Stay (HOSPITAL_COMMUNITY): Payer: Medicare Other

## 2016-10-01 DIAGNOSIS — R10819 Abdominal tenderness, unspecified site: Secondary | ICD-10-CM

## 2016-10-01 DIAGNOSIS — D638 Anemia in other chronic diseases classified elsewhere: Secondary | ICD-10-CM

## 2016-10-01 LAB — BASIC METABOLIC PANEL
ANION GAP: 8 (ref 5–15)
BUN: 60 mg/dL — AB (ref 6–20)
CHLORIDE: 112 mmol/L — AB (ref 101–111)
CO2: 26 mmol/L (ref 22–32)
Calcium: 7.5 mg/dL — ABNORMAL LOW (ref 8.9–10.3)
Creatinine, Ser: 2.59 mg/dL — ABNORMAL HIGH (ref 0.44–1.00)
GFR calc Af Amer: 18 mL/min — ABNORMAL LOW (ref >60)
GFR calc non Af Amer: 15 mL/min — ABNORMAL LOW (ref >60)
GLUCOSE: 178 mg/dL — AB (ref 65–99)
POTASSIUM: 4.3 mmol/L (ref 3.5–5.1)
Sodium: 146 mmol/L — ABNORMAL HIGH (ref 135–145)

## 2016-10-01 LAB — CBC
HEMATOCRIT: 23.7 % — AB (ref 36.0–46.0)
HEMOGLOBIN: 7.8 g/dL — AB (ref 12.0–15.0)
MCH: 26.7 pg (ref 26.0–34.0)
MCHC: 32.9 g/dL (ref 30.0–36.0)
MCV: 81.2 fL (ref 78.0–100.0)
Platelets: 386 10*3/uL (ref 150–400)
RBC: 2.92 MIL/uL — ABNORMAL LOW (ref 3.87–5.11)
RDW: 17.4 % — AB (ref 11.5–15.5)
WBC: 15.6 10*3/uL — ABNORMAL HIGH (ref 4.0–10.5)

## 2016-10-01 LAB — MRSA PCR SCREENING: MRSA BY PCR: POSITIVE — AB

## 2016-10-01 MED ORDER — FLEET ENEMA 7-19 GM/118ML RE ENEM: 1.0000 | ENEMA | Freq: Once | RECTAL | Status: DC

## 2016-10-01 MED ORDER — COLLAGENASE 250 UNIT/GM EX OINT
TOPICAL_OINTMENT | Freq: Every day | CUTANEOUS | Status: DC
Start: 2016-10-01 — End: 2016-10-05
  Administered 2016-10-01 – 2016-10-04 (×4): via TOPICAL
  Filled 2016-10-01: qty 90

## 2016-10-01 MED ORDER — MUPIROCIN 2 % EX OINT
1.0000 "application " | TOPICAL_OINTMENT | Freq: Two times a day (BID) | CUTANEOUS | Status: DC
  Administered 2016-10-01 – 2016-10-05 (×9): 1 via NASAL
  Filled 2016-10-01 (×2): qty 22

## 2016-10-01 MED ORDER — POLYETHYLENE GLYCOL 3350 17 G PO PACK
17.0000 g | PACK | Freq: Every day | ORAL | Status: DC
  Administered 2016-10-02 – 2016-10-05 (×3): 17 g via ORAL
  Filled 2016-10-01 (×4): qty 1

## 2016-10-01 MED ORDER — VANCOMYCIN HCL 500 MG IV SOLR
500.0000 mg | INTRAVENOUS | Status: DC
  Filled 2016-10-01: qty 500

## 2016-10-01 MED ORDER — IOPAMIDOL (ISOVUE-300) INJECTION 61%
15.0000 mL | Freq: Once | INTRAVENOUS | Status: DC | PRN
  Administered 2016-10-01: 15 mL via ORAL
  Filled 2016-10-01: qty 30

## 2016-10-01 MED ORDER — IOPAMIDOL (ISOVUE-300) INJECTION 61%
INTRAVENOUS | Status: AC
  Administered 2016-10-01: 15 mL via ORAL
  Filled 2016-10-01: qty 30

## 2016-10-01 MED ORDER — CHLORHEXIDINE GLUCONATE CLOTH 2 % EX PADS
6.0000 | MEDICATED_PAD | Freq: Every day | CUTANEOUS | Status: DC
  Administered 2016-10-01 – 2016-10-05 (×4): 6 via TOPICAL

## 2016-10-01 NOTE — Progress Notes (Signed)
Initial Nutrition Assessment  DOCUMENTATION CODES:   Severe malnutrition in context of chronic illness  INTERVENTION:  - Will monitor for SLP evaluation and associated recommendations. - Will provide appropriate interventions at time of follow-up.   NUTRITION DIAGNOSIS:   Malnutrition (severe) related to chronic illness (cognitive impairment) as evidenced by severe depletion of muscle mass, severe depletion of body fat.  GOAL:   Patient will meet greater than or equal to 90% of their needs  MONITOR:   Diet advancement, Weight trends, Labs, Skin  REASON FOR ASSESSMENT:   Low Braden  ASSESSMENT:   83 y.o. female with a past medical history of asthma, cognitive impairment, who lives in a skilled nursing facility and who also has a stage IV sacral decubitus. Patient has been hospitalized 4 times already this year. This will be her fifth hospitalization. She was apparently sent over from the skilled nursing facility as she was not her usual self. She wasn't responding to family members. She has had poor oral intake over the past many days according to her son. She apparently also felt warm to touch. No mention of nausea, vomiting. Patient has not able to answer questions.  Pt seen for low Braden. BMI indicates normal weight. Pt has been NPO since admission. Pt is disoriented x4 and unable to provide any information. No family/visitors present at this time. Per Dr. Chancy Milroy note today, plan for SLP evaluation to assess for ability to safely advance diet. Pt was seen by this RD on 06/30/16 and during that admission she was eating poorly. Note from that time stated that pt had also been eating poorly during admission in April 2018.   Physical assessment shows moderate and severe muscle and moderate and severe fat wasting. Weight in May was 126 lbs; based on current weight, she has lost 5 lbs (4% body weight) in the past 3 months which is not significant for time frame.  Medications  reviewed. Labs reviewed; Na: 146 mmol/L, Cl: 112 mmol/L, BUN: 60 mg/dL, creatinine: 8.34 mg/dL, Ca: 7.5 mg/dL, GFR: 15 mL/min.  IVF: NS @ 75 mL/hr.    Diet Order:  Diet NPO time specified  Skin:  Wound (see comment) (Unstageable, full thickness sacral and bilateral heels pressure injuries)  Last BM:  8/22  Height:   Ht Readings from Last 1 Encounters:  09/30/16 5\' 4"  (1.626 m)    Weight:   Wt Readings from Last 1 Encounters:  09/30/16 121 lb 4.1 oz (55 kg)    Ideal Body Weight:  54.54 kg  BMI:  Body mass index is 20.81 kg/m.  Estimated Nutritional Needs:   Kcal:  1650-1925 (30-35 kcal/kg)  Protein:  83-94 grams (1.5-1.7 grams/kg)  Fluid:  >/= 1.8 L/day  EDUCATION NEEDS:   No education needs identified at this time    Trenton Gammon, MS, RD, LDN, CNSC Inpatient Clinical Dietitian Pager # [phone] After hours/weekend pager # [phone]

## 2016-10-01 NOTE — Progress Notes (Signed)
Pharmacy Antibiotic Note  Elizabeth Keller is a 83 y.o. female admitted on 09/30/2016 with sepsis.  Pharmacy has been consulted for vancomycin and Zosyn dosing. Noted to have AKI with SCr 3.36 (0.6 on 08/06/16). Patient has received Vancomycin 1g and Zosyn 3.375g in ED at 12:49 today.  Day #2 antibiotics - renal: Scr improving - WBC unchanged - BCx - NGTD - afebrile  Plan:  Continue with Zosyn 2.25g IV q8h for CrCl < 20 ml/min  Order vancomycin 500mg  IV q48h with next dose due 8/24 at noon  Continue to follow SCr daily to evaluate need for dose adjustment  Check levels as need  Goal trough 10-15 mcg/mL baed on current indication of UTI and/or skin/soft tissue Check SCr in AM (expect improvement with IV hydration) to determine vancomycin maintenance dose  Follow up culture data and narrow antibiotics as appropriate. Note UA from chronic foley will always have WBC and bacteria.  Also, appears urine cx collected prior to exchanging to new catheter so if culture grows bacteria, it may represent colonization.    Height: 5\' 4"  (162.6 cm) Weight: 121 lb 4.1 oz (55 kg) IBW/kg (Calculated) : 54.7  Temp (24hrs), Avg:98.2 F (36.8 C), Min:97.6 F (36.4 C), Max:98.6 F (37 C)   Recent Labs Lab 09/30/16 1234 09/30/16 1238 10/01/16 0623  WBC 14.1*  --  15.6*  CREATININE 3.36*  --  2.59*  LATICACIDVEN  --  1.38  --     Estimated Creatinine Clearance: 12.2 mL/min (A) (by C-G formula based on SCr of 2.59 mg/dL (H)).    Allergies  Allergen Reactions  . Dicyclomine Other (See Comments)    Reaction:  Dizziness   . Gabapentin Other (See Comments)    Reaction:  Dizziness   . Metformin And Related Nausea And Vomiting  . Lyrica [Pregabalin] Other (See Comments)    Reaction:  Unknown  . Topiramate Other (See Comments)    Reaction:  Unknown   Antimicrobials this admission:  8/22 Vanc >> 8/22 Zosyn >>  Dose adjustments this admission:   Microbiology results:  8/22 BCx: NGTD 8/22  UA: pyruria, many bacteria - this is normal finding w/ chronic catheter 8/22 Ucx: pending (collected from prev catheter so if growth, may be colonization) 8/22 MRSA PCR: positive  Thank you for allowing pharmacy to be a part of this patient's care.  Juliette Alcide, PharmD, BCPS.   Pager: [phone] 10/01/2016 12:43 PM

## 2016-10-01 NOTE — Clinical Social Work Note (Signed)
Clinical Social Work Assessment  Patient Details  Name: Elizabeth Keller MRN: [id number] Date of Birth: [birth year]  Date of referral:  10/01/16               Reason for consult:  Facility Placement, Discharge Planning                Permission sought to share information with:  Case Manager Permission granted to share information::  Yes, Verbal Permission Granted  Name::        Agency::     Relationship::     Contact Information:     Housing/Transportation Living arrangements for the past 2 months:  Skilled Nursing Facility Source of Information:  Adult Children Patient Interpreter Needed:  None Criminal Activity/Legal Involvement Pertinent to Current Situation/Hospitalization:  No - Comment as needed Significant Relationships:  Adult Children Lives with:  Facility Resident Do you feel safe going back to the place where you live?  No (Daughter is displeased with care at SNF.) Need for family participation in patient care:  Yes (Comment)  Care giving concerns:  Daughter is not pleased with care pt is receiving at [facility].   Social Worker assessment / plan:  Pt hospitalized from [facility] on 09/30/16 with Sepsis. Pt is oriented x1 and unable to participate in dc planning. CSW contacted pt's daughter, Elizabeth Keller [phone], to assist with dc planning. Daughter reports that she isn't pleased with care at [facility] and has requested new placement. CSW has initiated new SNF search and bed offers are pending. CSW will provide SNF offers once available. CSW will continue to follow to assist with dc planning needs.  Employment status:  Retired Health and safety inspector:  Medicare PT Recommendations:    Information / Referral to community resources:     Patient/Family's Response to care:  Daughter requesting new SNF placement.  Patient/Family's Understanding of and Emotional Response to Diagnosis, Current Treatment, and Prognosis:  Daughter is aware of pt's medical status. She is hopeful that  a SNF placement can be arranged for pt prior to dc. " I don't want my mother to return to that nursing home. " Support / reassurance provided.  Emotional Assessment Appearance:  Appears stated age Attitude/Demeanor/Rapport:  Unable to Assess Affect (typically observed):  Unable to Assess Orientation:  Oriented to Self Alcohol / Substance use:  Not Applicable Psych involvement (Current and /or in the community):  No (Comment)  Discharge Needs  Concerns to be addressed:  Discharge Planning Concerns Readmission within the last 30 days:  No Current discharge risk:  None Barriers to Discharge:  No Barriers Identified   Elizabeth Asal, LCSW  [phone] 10/01/2016, 4:22 PM

## 2016-10-01 NOTE — Progress Notes (Addendum)
Sacral wound  And Bi heel wounds have been cleansed with NS and have wet to dry dressings on them. Sacral at 8/22 @ 2200 and heels  8/23 @ 0245

## 2016-10-01 NOTE — Progress Notes (Signed)
Upon return from CT scan, RN and NT began performing sacral dressing change. The patient was noted to have some hard stool coming out of her rectum. RN manually removed hard stool with disimpaction. Once hard stool was removed, the patient had a very large BM that was soft and brown. MD had placed ordered for an enema d/t the CT results showing stool in the colon. MD was made aware of BM results and that enema was not needed at this time. Will continue to monitor BMs.

## 2016-10-01 NOTE — Progress Notes (Signed)
PT  Note  Patient Details Name: Elizabeth Keller MRN: [id number] DOB: [birth year]   Received order for whirlpool. Clarification, we conduct pulsed lavage at bedside for debridement of sacral wound.   Spoke with nurse at bedside who will perform dressing change with santyl today , and we will begin [facility] therapy (Pulsed lavage tomorrow)   Call with any questions.  Thank you.   Marella Bile 10/01/2016, 12:29 PM  Marella Bile, PT Pager: [phone] 10/01/2016

## 2016-10-01 NOTE — Consult Note (Signed)
WOC Nurse wound consult note Reason for Consult:sacral and bilateral heel wounds and right  trochanter Wound type: known stage IV to sacrum, Right heel stage IV, left heel unstageable, DTI to right trochanter.  Pressure Injury POA: Yes Measurement:Right heel 7cm x 7cm x 0.3 stage IV 90% pink bed with 10% deeper area with cartilage showing, small amt sanguineous exudate, no odor. Left heel is unstageable with 5cm x 6cm x 0.2cm 60% tan loose slough, 30% black slough, 10% pink, moderate brown exudate, malodorous. Sacral ulcer is 17cm x 9cm x 1.5cm, no tunneling or undermining, brown exudate, malodorous. Right trochanter 3cm circle black DTI  Wound bed: see above Drainage (amount, consistency, odor) see above Periwound: intact Dressing procedure/placement/frequency:Have spoken with bedside RN and I have ordered PT for Whirlpool treatments followed by Santyl to sacrum and left heel. I have provided nurses with orders for NS wet to dry to right heel as it is clean.  Also orders for bedside RN to perform all dressings on Sundays as Whirlpool is not completed on Sundays. Foam ordered to right hip for protection of friable area. Patient already on air bed, already in pressure alleviating boots. Pt is NPO currently and doubt she would be a parenteral candidate so nutrition will need to be addressed at later time. We will not follow, but will remain available to this patient, to nursing, and the medical and/or surgical teams. Please re-consult if we need to assist further.   Barnett Hatter, RN-C, WTA-C, [facility] Wound Treatment Associate

## 2016-10-01 NOTE — Progress Notes (Signed)
Per daughter [name], we are not to give information to anyone besides her and son Rudar Ricks.

## 2016-10-01 NOTE — NC FL2 (Deleted)
[place] MEDICAID FL2 LEVEL OF CARE SCREENING TOOL     IDENTIFICATION  Patient Name: Elizabeth Keller Birthdate: [birth year] Sex: female Admission Date (Current Location): 09/30/2016  [facility] and [place] Number:  Producer, television/film/video and Address:  [facility],  [address], [place] [address]      Provider Number: [id number]  Attending Physician Name and Address:  Osvaldo Shipper, MD  Relative Name and Phone Number:       Current Level of Care: Hospital Recommended Level of Care: Skilled Nursing Facility Prior Approval Number:    Date Approved/Denied:   PASRR Number:    Discharge Plan: SNF    Current Diagnoses: Patient Active Problem List   Diagnosis Date Noted  . ARF (acute renal failure) (HCC) 09/30/2016  . Osteoarthritis of multiple joints 09/03/2016  . Right wrist fracture, sequela 09/03/2016  . Swelling of left hand 08/31/2016  . Cognitive impairment 08/14/2016  . Anemia, chronic disease 08/14/2016  . GERD (gastroesophageal reflux disease) 08/13/2016  . Stage IV pressure ulcer of sacral region (HCC) 07/08/2016  . Decubitus ulcer of heel, bilateral, unstageable (HCC) 07/08/2016  . Dyslipidemia associated with type 2 diabetes mellitus (HCC) 07/08/2016  . Failure to thrive in adult 07/08/2016  . Weight loss, non-intentional 07/08/2016  . Anxiety disorder 07/08/2016  . Severe protein-calorie malnutrition (HCC) 06/30/2016  . Sepsis (HCC) 06/29/2016  . Severe sepsis (HCC) 05/15/2016  . Type II diabetes mellitus with renal manifestations (HCC) 04/04/2016  . Chronic kidney disease, stage III (moderate) 03/14/2016    Orientation RESPIRATION BLADDER Height & Weight     Self  Normal Indwelling catheter Weight: 55 kg (121 lb 4.1 oz) Height:  5\' 4"  (162.6 cm)  BEHAVIORAL SYMPTOMS/MOOD NEUROLOGICAL BOWEL NUTRITION STATUS  Other (Comment) (no behaviors)   Incontinent Diet (see dc summary)  AMBULATORY STATUS COMMUNICATION OF NEEDS Skin   Extensive Assist  Verbally Hydro Therapy, Other (Comment) (unstageable pressure wounds on sacrum / right / left heels.)                       Personal Care Assistance Level of Assistance  Bathing, Feeding, Dressing, Total care Bathing Assistance: Maximum assistance Feeding assistance: Limited assistance Dressing Assistance: Maximum assistance Total Care Assistance: Maximum assistance   Functional Limitations Info  Sight, Hearing, Speech Sight Info: Adequate Hearing Info: Adequate Speech Info: Adequate    SPECIAL CARE FACTORS FREQUENCY                       Contractures Contractures Info: Not present    Additional Factors Info  Allergies, Code Status Code Status Info: DNR Allergies Info: Dicyclomine, Gabapentin, Metformin And Related, Lyrica Pregabalin, Topiramate           Current Medications (10/01/2016):  This is the current hospital active medication list Current Facility-Administered Medications  Medication Dose Route Frequency Provider Last Rate Last Dose  . 0.9 %  sodium chloride infusion   Intravenous Continuous Osvaldo Shipper, MD 75 mL/hr at 10/01/16 [phone]    . acetaminophen (TYLENOL) tablet 650 mg  650 mg Oral Q6H PRN Osvaldo Shipper, MD       Or  . acetaminophen (TYLENOL) suppository 650 mg  650 mg Rectal Q6H PRN Osvaldo Shipper, MD      . Chlorhexidine Gluconate Cloth 2 % PADS 6 each  6 each Topical Q0600 Osvaldo Shipper, MD   6 each at 10/01/16 1045  . collagenase (SANTYL) ointment   Topical Daily Rito Ehrlich,  Gokul, MD      . heparin injection 5,000 Units  5,000 Units Subcutaneous Q8H Osvaldo Shipper, MD   5,000 Units at 10/01/16 0600  . mupirocin ointment (BACTROBAN) 2 % 1 application  1 application Nasal BID Osvaldo Shipper, MD      . ondansetron [facility]) tablet 4 mg  4 mg Oral Q6H PRN Osvaldo Shipper, MD       Or  . ondansetron [facility]) injection 4 mg  4 mg Intravenous Q6H PRN Osvaldo Shipper, MD      . piperacillin-tazobactam (ZOSYN) IVPB 2.25 g  2.25 g Intravenous  Q8H Rollene Fare, [place]   Stopped at 10/01/16 0430  . sodium chloride flush (NS) 0.9 % injection 3 mL  3 mL Intravenous Q12H Osvaldo Shipper, MD   3 mL at 10/01/16 1000  . [START ON 10/02/2016] vancomycin (VANCOCIN) 500 mg in sodium chloride 0.9 % 100 mL IVPB  500 mg Intravenous Q48H Aleda Grana, [facility]         Discharge Medications: Please see discharge summary for a list of discharge medications.  Relevant Imaging Results:  Relevant Lab Results:   Additional Information SS # [id number]. Pt is presently receiving hydro therapy. It is unclear if this will be needed at dc.  Tej Murdaugh, Dickey Gave, LCSW

## 2016-10-01 NOTE — Clinical Social Work Placement (Signed)
   CLINICAL SOCIAL WORK PLACEMENT  NOTE  Date:  10/01/2016  Patient Details  Name: Elizabeth Keller MRN: [id number] Date of Birth: Dec 09, 1923  Clinical Social Work is seeking post-discharge placement for this patient at the Skilled  Nursing Facility level of care (*CSW will initial, date and re-position this form in  chart as items are completed):      Patient/family provided with [facility] Clinical Social Work Department's list of facilities offering this level of care within the geographic area requested by the patient (or if unable, by the patient's family).  Yes   Patient/family informed of their freedom to choose among providers that offer the needed level of care, that participate in Medicare, Medicaid or managed care program needed by the patient, have an available bed and are willing to accept the patient.  Yes   Patient/family informed of [facility]'s ownership interest in [facility] and [facility], as well as of the fact that they are under no obligation to receive care at these facilities.  PASRR submitted to EDS on       PASRR number received on 10/01/16     Existing PASRR number confirmed on       FL2 transmitted to all facilities in geographic area requested by pt/family on 10/01/16     FL2 transmitted to all facilities within larger geographic area on       Patient informed that his/her managed care company has contracts with or will negotiate with certain facilities, including the following:            Patient/family informed of bed offers received.  Patient chooses bed at       Physician recommends and patient chooses bed at      Patient to be transferred to   on  .  Patient to be transferred to facility by       Patient family notified on   of transfer.  Name of family member notified:        PHYSICIAN       Additional Comment:    _______________________________________________ [name], [place]  [phone] 10/01/2016, 4:38 PM

## 2016-10-01 NOTE — Evaluation (Signed)
SLP Cancellation Note  Patient Details Name: Elizabeth Keller MRN: [id number] DOB: Aug 06, 1923   Cancelled treatment:       Reason Eval/Treat Not Completed: Medical issues which prohibited therapy (pt having pain currently when abdomen palpated by Md, Md ordering test to look at abdomen, will conduct bse after test if clear; thanks)   Donavan Burnet, MS [facility] SLP [phone]

## 2016-10-01 NOTE — Evaluation (Signed)
Physical Therapy Evaluation Patient Details Name: Elizabeth Keller MRN: [id number] DOB: October 11, 1923 Today's Date: 10/01/2016   History of Present Illness  83 y.o. female with a past medical history of asthma, cognitive impairment, who lives in a skilled nursing facility and who also has a stage IV sacral decubitus. Pt admitted with decreased responsiveness. Dx of UTI, sacral and heel ulcers, ARF, sepsis, metabolic encephalopathy.  Clinical Impression  Pt not able to participate in PT, she is confused and cannot follow directions, she has pain with minimal movement of BUE/LEs. Lift equipment recommended for transfers. PT signing off, please re-order if cognitive status improves and she is able to participate in PT.     Follow Up Recommendations SNF;Supervision/Assistance - 24 hour    Equipment Recommendations  None recommended by PT    Recommendations for Other Services       Precautions / Restrictions Precautions Precautions: Fall Restrictions Weight Bearing Restrictions: No      Mobility  Bed Mobility               General bed mobility comments: attempted, but pt grimmaced and moaned due to pain so stopped  Transfers                 General transfer comment: will need lift equipment  Ambulation/Gait             General Gait Details: unable  Stairs            Wheelchair Mobility    Modified Rankin (Stroke Patients Only)       Balance                                             Pertinent Vitals/Pain Pain Assessment: Faces Faces Pain Scale: Hurts even more Pain Location: B hands, shoulders, knee, hips with attempted PROM Pain Descriptors / Indicators: Grimacing Pain Intervention(s): Limited activity within patient's tolerance;Monitored during session;Repositioned    Home Living Family/patient expects to be discharged to:: Unsure                 Additional Comments: no family present, pt unable to provide hx;  admitted from SNF    Prior Function Level of Independence: Needs assistance   Gait / Transfers Assistance Needed: no family present  ADL's / Homemaking Assistance Needed: Per note from February: pt has had to be assisted for basic ADL's lately  Comments: pt was able to ambulate and perform ADLs prior to fall and hospital admission in February     Hand Dominance        Extremity/Trunk Assessment   Upper Extremity Assessment Upper Extremity Assessment: Difficult to assess due to impaired cognition (pt did not respond to command to squeeze my fingers, pain with attempted PROM to BUEs)    Lower Extremity Assessment Lower Extremity Assessment: Difficult to assess due to impaired cognition (pt grimmaced and moaned in pain with attempted PROM to BLEs)       Communication   Communication: No difficulties (pt repeats phrases that she hears)  Cognition Arousal/Alertness: Awake/alert Behavior During Therapy: WFL for tasks assessed/performed Overall Cognitive Status: No family/caregiver present to determine baseline cognitive functioning                                 General Comments: only oriented to self,  not to location, situation, birthday; cannot follow directions at all      General Comments      Exercises     Assessment/Plan    PT Assessment Patent does not need any further PT services  PT Problem List         PT Treatment Interventions      PT Goals (Current goals can be found in the Care Plan section)  Acute Rehab PT Goals PT Goal Formulation: Patient unable to participate in goal setting    Frequency     Barriers to discharge        Co-evaluation               AM-PAC PT "6 Clicks" Daily Activity  Outcome Measure Difficulty turning over in bed (including adjusting bedclothes, sheets and blankets)?: Unable Difficulty moving from lying on back to sitting on the side of the bed? : Unable Difficulty sitting down on and standing up from  a chair with arms (e.g., wheelchair, bedside commode, etc,.)?: Unable Help needed moving to and from a bed to chair (including a wheelchair)?: Total Help needed walking in hospital room?: Total Help needed climbing 3-5 steps with a railing? : Total 6 Click Score: 6    End of Session   Activity Tolerance: Patient limited by pain Patient left: in bed;with bed alarm set Nurse Communication: Mobility status;Other (comment) (call bell not working)      Time: 9147-8295 PT Time Calculation (min) (ACUTE ONLY): 12 min   Charges:   PT Evaluation $PT Eval Moderate Complexity: 1 Mod     PT G Codes:          Tamala Ser 10/01/2016, 9:47 AM [phone]

## 2016-10-01 NOTE — NC FL2 (Signed)
[place] MEDICAID FL2 LEVEL OF CARE SCREENING TOOL     IDENTIFICATION  Patient Name: Elizabeth Keller Birthdate: [birth year] Sex: female Admission Date (Current Location): 09/30/2016  [facility] and [place] Number:  Producer, television/film/video and Address:  [facility],  [address] [place] [address]      Provider Number: [id number]  Attending Physician Name and Address:  Osvaldo Shipper, MD  Relative Name and Phone Number:       Current Level of Care: Hospital Recommended Level of Care: Skilled Nursing Facility Prior Approval Number:    Date Approved/Denied:   PASRR Number: [id number]  Discharge Plan: SNF    Current Diagnoses: Patient Active Problem List   Diagnosis Date Noted  . ARF (acute renal failure) (HCC) 09/30/2016  . Osteoarthritis of multiple joints 09/03/2016  . Right wrist fracture, sequela 09/03/2016  . Swelling of left hand 08/31/2016  . Cognitive impairment 08/14/2016  . Anemia, chronic disease 08/14/2016  . GERD (gastroesophageal reflux disease) 08/13/2016  . Stage IV pressure ulcer of sacral region (HCC) 07/08/2016  . Decubitus ulcer of heel, bilateral, unstageable (HCC) 07/08/2016  . Dyslipidemia associated with type 2 diabetes mellitus (HCC) 07/08/2016  . Failure to thrive in adult 07/08/2016  . Weight loss, non-intentional 07/08/2016  . Anxiety disorder 07/08/2016  . Severe protein-calorie malnutrition (HCC) 06/30/2016  . Sepsis (HCC) 06/29/2016  . Severe sepsis (HCC) 05/15/2016  . Type II diabetes mellitus with renal manifestations (HCC) 04/04/2016  . Chronic kidney disease, stage III (moderate) 03/14/2016    Orientation RESPIRATION BLADDER Height & Weight     Self  Normal Indwelling catheter Weight: 55 kg (121 lb 4.1 oz) Height:  5\' 4"  (162.6 cm)  BEHAVIORAL SYMPTOMS/MOOD NEUROLOGICAL BOWEL NUTRITION STATUS  Other (Comment) (no behaviors)   Incontinent Diet (see dc summary)  AMBULATORY STATUS COMMUNICATION OF NEEDS Skin    Extensive Assist Verbally Hydro Therapy (Unstageable wounds on sacrum, left/right heels)                       Personal Care Assistance Level of Assistance  Bathing, Feeding, Dressing, Total care Bathing Assistance: Maximum assistance Feeding assistance: Limited assistance Dressing Assistance: Maximum assistance Total Care Assistance: Maximum assistance   Functional Limitations Info  Sight, Hearing, Speech Sight Info: Adequate Hearing Info: Adequate Speech Info: Adequate    SPECIAL CARE FACTORS FREQUENCY  PT (By licensed PT), OT (By licensed OT)     PT Frequency: 5x wk OT Frequency: 5x wk            Contractures Contractures Info: Not present    Additional Factors Info  Code Status, Isolation Precautions, Allergies Code Status Info: DNR Allergies Info: Dicyclomine, Gabapentin, Metformin And Related, Lyrica Pregabalin, Topiramate           Current Medications (10/01/2016):  This is the current hospital active medication list Current Facility-Administered Medications  Medication Dose Route Frequency Provider Last Rate Last Dose  . 0.9 %  sodium chloride infusion   Intravenous Continuous Osvaldo Shipper, MD 75 mL/hr at 10/01/16 [phone]    . acetaminophen (TYLENOL) tablet 650 mg  650 mg Oral Q6H PRN Osvaldo Shipper, MD       Or  . acetaminophen (TYLENOL) suppository 650 mg  650 mg Rectal Q6H PRN Osvaldo Shipper, MD      . Chlorhexidine Gluconate Cloth 2 % PADS 6 each  6 each Topical Q0600 Osvaldo Shipper, MD   6 each at 10/01/16 1045  . collagenase (SANTYL) ointment  Topical Daily Osvaldo Shipper, MD      . heparin injection 5,000 Units  5,000 Units Subcutaneous Q8H Osvaldo Shipper, MD   5,000 Units at 10/01/16 0600  . mupirocin ointment (BACTROBAN) 2 % 1 application  1 application Nasal BID Osvaldo Shipper, MD      . ondansetron [facility]) tablet 4 mg  4 mg Oral Q6H PRN Osvaldo Shipper, MD       Or  . ondansetron [facility]) injection 4 mg  4 mg Intravenous Q6H PRN  Osvaldo Shipper, MD      . piperacillin-tazobactam (ZOSYN) IVPB 2.25 g  2.25 g Intravenous Q8H Rollene Fare, [place]   Stopped at 10/01/16 0430  . sodium chloride flush (NS) 0.9 % injection 3 mL  3 mL Intravenous Q12H Osvaldo Shipper, MD   3 mL at 10/01/16 1000  . [START ON 10/02/2016] vancomycin (VANCOCIN) 500 mg in sodium chloride 0.9 % 100 mL IVPB  500 mg Intravenous Q48H Aleda Grana, [facility]         Discharge Medications: Please see discharge summary for a list of discharge medications.  Relevant Imaging Results:  Relevant Lab Results:   Additional Information SS # [id number]. Pt is presently receiving hydro therapy. It is unclear if this will be needed at dc.  Allexis Bordenave, Dickey Gave, LCSW

## 2016-10-01 NOTE — Progress Notes (Addendum)
[facility] PROGRESS NOTE  Eura Mccauslin ZOX:[id number] DOB: 1923-04-10 DOA: 09/30/2016  PCP: Sandford Craze, NP  Brief History/Interval Summary: 83 year old African-American female with a past medical history of asthma, cognitive impairment, stage IV sacral decubitus, who lives in a skilled nursing facility and was been hospitalized 4 times this year. She was brought into the hospital, as apparently she was not her usual self and wasn't eating much over the past few days. She was found to have acute renal failure and some concern for sepsis. She was hospitalized for further management.  Reason for Visit: Acute renal failure. Sepsis.  Consultants: None  Procedures: None  Antibiotics: Vancomycin and Zosyn  Subjective/Interval History: Patient remains distracted and confused this morning. Not much information is available from her. She does not appear to be in any distress or discomfort.  ROS: Unable to do due to her confusion  Objective:  Vital Signs  Vitals:   09/30/16 1539 09/30/16 2028 10/01/16 0200 10/01/16 0432  BP: (!) 119/58 (!) 90/48 (!) 105/59 (!) 104/54  Pulse: 93 (!) 101 100 100  Resp: 18 (!) 24 20 20   Temp: 98.6 F (37 C) 97.6 F (36.4 C) 98.6 F (37 C) 98.1 F (36.7 C)  TempSrc: Axillary Rectal Oral Oral  SpO2: 98% 91% 100% 98%  Weight: 55 kg (121 lb 4.1 oz)     Height: 5\' 4"  (1.626 m)       Intake/Output Summary (Last 24 hours) at 10/01/16 1222 Last data filed at 10/01/16 1000  Gross per 24 hour  Intake           4297.5 ml  Output             1600 ml  Net           2697.5 ml   Filed Weights   09/30/16 1539  Weight: 55 kg (121 lb 4.1 oz)    General appearance: alert, distracted and no distress Head: Normocephalic, without obvious abnormality, atraumatic Resp: clear to auscultation bilaterally Cardio: regular rate and rhythm, S1, S2 normal, no murmur, click, rub or gallop GI: Abdomen is soft. Guarding is present. She is tender  diffusely without any rebound or rigidity. Bowel sounds are present but sluggish. No obvious masses or organomegaly. Extremities: No pedal edema. Skin: Stage IV sacral decubitus with yellowish exudate. Neurologic: Awake, alert, but distracted confused. No obvious focal neurological deficits.  Lab Results:  Data Reviewed: I have personally reviewed following labs and imaging studies  CBC:  Recent Labs Lab 09/30/16 1234 10/01/16 0623  WBC 14.1* 15.6*  NEUTROABS 12.0*  --   HGB 9.5* 7.8*  HCT 28.9* 23.7*  MCV 80.7 81.2  PLT 446* 386    Basic Metabolic Panel:  Recent Labs Lab 09/30/16 1234 10/01/16 0623  NA 142 146*  K 5.0 4.3  CL 105 112*  CO2 25 26  GLUCOSE 251* 178*  BUN 65* 60*  CREATININE 3.36* 2.59*  CALCIUM 8.2* 7.5*    GFR: Estimated Creatinine Clearance: 12.2 mL/min (A) (by C-G formula based on SCr of 2.59 mg/dL (H)).  Liver Function Tests:  Recent Labs Lab 09/30/16 1234  AST 12*  ALT 7*  ALKPHOS 65  BILITOT 1.6*  PROT 6.1*  ALBUMIN 2.1*     Coagulation Profile:  Recent Labs Lab 09/30/16 1234  INR 1.73     Recent Results (from the past 240 hour(s))  Culture, blood (Routine x 2)     Status: None (Preliminary result)   Collection Time: 09/30/16  12:30 PM  Result Value Ref Range Status   Specimen Description BLOOD RIGHT ARM  Final   Special Requests   Final    BOTTLES DRAWN AEROBIC AND ANAEROBIC Blood Culture adequate volume   Culture   Final    NO GROWTH < 24 HOURS Performed at [facility], [address], [place], [place] [address]    Report Status PENDING  Incomplete  Culture, blood (Routine x 2)     Status: None (Preliminary result)   Collection Time: 09/30/16 12:32 PM  Result Value Ref Range Status   Specimen Description BLOOD LEFT HAND  Final   Special Requests   Final    BOTTLES DRAWN AEROBIC AND ANAEROBIC Blood Culture adequate volume   Culture   Final    NO GROWTH < 24 HOURS Performed at [facility],  [address] [place], [place] [address]    Report Status PENDING  Incomplete  MRSA PCR Screening     Status: Abnormal   Collection Time: 09/30/16  6:10 PM  Result Value Ref Range Status   MRSA by PCR POSITIVE (A) NEGATIVE Final    Comment:        The GeneXpert MRSA Assay (FDA approved for NASAL specimens only), is one component of a comprehensive MRSA colonization surveillance program. It is not intended to diagnose MRSA infection nor to guide or monitor treatment for MRSA infections. RESULT CALLED TO, READ BACK BY AND VERIFIED WITH: PATRAM,L RN 8.23.18 @0006  ZANDO,C       Radiology Studies: Dg Chest Port 1 View  Result Date: 09/30/2016 CLINICAL DATA:  Altered mental status.  Unresponsive. EXAM: PORTABLE CHEST 1 VIEW COMPARISON:  06/29/2016 FINDINGS: Heart size is normal. Chronic aortic atherosclerosis. Chronic pleural and parenchymal scarring in the upper chest with chronic volume loss. No sign of active infiltrate, collapse or effusion. No acute bone finding. IMPRESSION: Chronic lung disease with upper lobe retraction. No active process identified. Electronically Signed   By: Paulina Fusi M.D.   On: 09/30/2016 12:47   Dg Abd Portable 2v  Result Date: 09/30/2016 CLINICAL DATA:  Abdominal tenderness. EXAM: PORTABLE ABDOMEN - 2 VIEW COMPARISON:  Radiograph of Jun 29, 2016. FINDINGS: The bowel gas pattern is normal. There is no evidence of free air on left lateral decubitus film. Atherosclerosis of abdominal aorta is noted. Phleboliths are noted in the pelvis. IMPRESSION: No evidence of bowel obstruction or ileus. No pneumoperitoneum is noted. Aortic atherosclerosis. Electronically Signed   By: Lupita Raider, M.D.   On: 09/30/2016 17:41     Medications:  Scheduled: . Chlorhexidine Gluconate Cloth  6 each Topical Q0600  . collagenase   Topical Daily  . heparin  5,000 Units Subcutaneous Q8H  . mupirocin ointment  1 application Nasal BID  . sodium chloride flush  3 mL Intravenous  Q12H   Continuous: . sodium chloride 75 mL/hr at 10/01/16 0653  . piperacillin-tazobactam (ZOSYN)  IV Stopped (10/01/16 0430)   ZYT:[id number] **OR** acetaminophen, ondansetron **OR** ondansetron (ZOFRAN) IV  Assessment/Plan:  Principal Problem:   Sepsis (HCC) Active Problems:   Chronic kidney disease, stage III (moderate)   Severe protein-calorie malnutrition (HCC)   Stage IV pressure ulcer of sacral region [facility])   Cognitive impairment   Anemia, chronic disease   ARF (acute renal failure) (HCC)    Acute metabolic encephalopathy, Possibly due to renal failure. Sepsis could also be contributing. No obvious focal neurological deficits. Mental status appears to be better today compared to yesterday,  although she remains confused. Continue to monitor for now.  Sepsis, possibly due to UTI or infected sacral decubitus. Sepsis protocol was initiated. Lactic acid level was normal. Follow-up on cultures. Continue vancomycin and Zosyn for now. She is noted to be tender in her abdomen. Abdominal from did not show anything concerning. WBC remains elevated. Proceed with CT scan of the abdomen and pelvis without contrast.  Acute renal failure on chronic kidney disease stage III/chronic indwelling Foley catheter/hematuria Creatinine is slowly improving. Creatinine was 0.6 in June. Acute renal failure most likely due to poor oral intake at the skilled nursing facility. Continue IV fluids. Monitor urine output. Foley catheter was replaced yesterday. Some hematuria is present, likely due to trauma when Foley was inserted. Follow-up on CT scan. Irrigate bladder.  Sacral Decubitus, stage IV/Right heel pressure ulcer--stage 4-POA/ Left heel pressure ulcer/Unstageable Right trochanter-Deep tissue injury Sacral decubitus with some yellowish exudate, possibly suggesting infection. Wound care nurse has been consulted.  Anemia of chronic disease. Hemoglobin has dropped compared to yesterday. Most likely  due to dilution from IV fluids. She was likely hemoconcentrated yesterday. No evidence for overt bleeding. Continue to monitor for now. Transfuse if it drops below 7.  Severe protein calorie malnutrition. Initiated diet when she is able to safely take orally. Speech therapy has been consulted.  Goals of care. She is a limited code. Patient was seen in the palliative medicine during her last hospitalization. May have to revisit depending on our she does over the next 2-3 days.  DVT Prophylaxis: Subcutaneous heparin    Code Status: Partial code  Family Communication: No family at bedside today. Daughter was updated over phone. Disposition Plan: Management as outlined above.    LOS: 1 day   [facility]  [facility] Pager [phone] 10/01/2016, 12:22 PM  If 7PM-7AM, please contact night-coverage at [email or web address], password [facility]

## 2016-10-01 NOTE — Progress Notes (Signed)
OT Cancellation Note  Patient Details Name: Elizabeth Keller MRN: [id number] DOB: 09-04-1923   Cancelled Treatment:    Reason Eval/Treat Not Completed: Other (comment)  Noted pt confused and not able to follow directions.  Will sign off at pt not able to participate with OT. Please reorder if pt improves and would be able to participate  Lise Auer, [place] [phone].  Alba Cory 10/01/2016, 10:45 AM

## 2016-10-02 LAB — BASIC METABOLIC PANEL
ANION GAP: 7 (ref 5–15)
BUN: 53 mg/dL — ABNORMAL HIGH (ref 6–20)
CALCIUM: 7.5 mg/dL — AB (ref 8.9–10.3)
CHLORIDE: 114 mmol/L — AB (ref 101–111)
CO2: 23 mmol/L (ref 22–32)
CREATININE: 2.23 mg/dL — AB (ref 0.44–1.00)
GFR calc non Af Amer: 18 mL/min — ABNORMAL LOW (ref >60)
GFR, EST AFRICAN AMERICAN: 21 mL/min — AB (ref >60)
Glucose, Bld: 138 mg/dL — ABNORMAL HIGH (ref 65–99)
Potassium: 3.6 mmol/L (ref 3.5–5.1)
SODIUM: 144 mmol/L (ref 135–145)

## 2016-10-02 LAB — URINE CULTURE: Culture: >=100000 — AB

## 2016-10-02 LAB — CBC
HEMATOCRIT: 23.9 % — AB (ref 36.0–46.0)
HEMOGLOBIN: 7.9 g/dL — AB (ref 12.0–15.0)
MCH: 26.4 pg (ref 26.0–34.0)
MCHC: 33.1 g/dL (ref 30.0–36.0)
MCV: 79.9 fL (ref 78.0–100.0)
Platelets: 365 10*3/uL (ref 150–400)
RBC: 2.99 MIL/uL — ABNORMAL LOW (ref 3.87–5.11)
RDW: 17.5 % — ABNORMAL HIGH (ref 11.5–15.5)
WBC: 15.7 10*3/uL — ABNORMAL HIGH (ref 4.0–10.5)

## 2016-10-02 MED ORDER — METRONIDAZOLE IN NACL 5-0.79 MG/ML-% IV SOLN
500.0000 mg | Freq: Three times a day (TID) | INTRAVENOUS | Status: DC
  Administered 2016-10-02 – 2016-10-04 (×6): 500 mg via INTRAVENOUS
  Filled 2016-10-02 (×7): qty 100

## 2016-10-02 MED ORDER — DEXTROSE 5 % IV SOLN
1.0000 g | INTRAVENOUS | Status: DC
  Administered 2016-10-02 – 2016-10-03 (×2): 1 g via INTRAVENOUS
  Filled 2016-10-02 (×3): qty 10

## 2016-10-02 NOTE — Plan of Care (Signed)
Problem: Health Behavior/Discharge Planning: Goal: Ability to manage health-related needs will improve Outcome: Not Progressing Family including POA (dtr) at bedside.   Problem: Pain Managment: Goal: General experience of comfort will improve Outcome: Progressing C/o abdominal pain.  Tylenol given. Will continue to monitor and continue with current plan of care.  Problem: Physical Regulation: Goal: Will remain free from infection Outcome: Progressing On IV antibiotics.    Problem: Skin Integrity: Goal: Risk for impaired skin integrity will decrease Wound care as ordered.  Offered turning Q 2 hours, family not always compliant.   Problem: Activity: Goal: Risk for activity intolerance will decrease Outcome: Not Progressing .  Problem: Fluid Volume: Goal: Ability to maintain a balanced intake and output will improve Outcome: Progressing .  Problem: Nutrition: Goal: Adequate nutrition will be maintained Outcome: Progressing Put on dysphagia 2 diet after speech eval.   Will continue with current plan of care.

## 2016-10-02 NOTE — Evaluation (Signed)
Clinical/Bedside Swallow Evaluation Patient Details  Name: Elizabeth Keller MRN: [id number] Date of Birth: 06/09/1923  Today's Date: 10/02/2016 Time: SLP Start Time (ACUTE ONLY): 0940 SLP Stop Time (ACUTE ONLY): 1036 SLP Time Calculation (min) (ACUTE ONLY): 56 min  Past Medical History:  Past Medical History:  Diagnosis Date  . Anxiety disorder   . Arthritis   . Asthma   . Chronic kidney disease   . Degenerative disk disease   . Diabetes mellitus   . GERD (gastroesophageal reflux disease)   . Hypercholesteremia   . Hypertension   . IBS (irritable bowel syndrome)   . Sarcoidosis   . Sepsis (HCC) 06/29/2016  . Spinal stenosis    Past Surgical History:  Past Surgical History:  Procedure Laterality Date  . EYE SURGERY  2017   cataract / glaucoma   HPI:  83 yo female adm to [facility] from SNF with AMS, found to have UTI, sacral wound, sepsis, metabolic encephalopathy.  Pt has h/o progressive cognitive decline.  Swallow evaluation ordered, she is on puree diet at SNF.  CXR showed chronic lung disease with left upper lobe retraction but nothing acute.     Assessment / Plan / Recommendation Clinical Impression  Pt presents with cognitive based oral dysphagia - and suspected intact pharyngeal abilities.   Pt is edentulous but has adequate rotary "mastication" pattern with small bites of soft/moist foods - although mastication is lengthy.  No indication of airway compromise across all po trials.   Delayed oral transiting noted across all boluses = and pt only opens mouth minimally to accept small bites.  Recommend dys2/thin diet with strict precautions including use of straws, using liquids or puree to aid solid transiting/clearance, encouraging intake, observing swallow and checking for adequate oral clearance after intake.  Of note, pt will be a laborious feeder due to her dysphagia.  Family arrived and was educated extensively to dysphagia, compensation strategies, other aspiration pna risk  factors verbally and in writing.  Will follow up x1 to assure tolerance given this is a dietary advancement from baseline puree.  Of note, family reports they frequently brought her soft whole foods at SNF as she often declined to eat purees.   SLP Visit Diagnosis: Dysphagia, oral phase (R13.11)    Aspiration Risk  Moderate aspiration risk    Diet Recommendation Dysphagia 2 (Fine chop);Thin liquid   Liquid Administration via: Straw Medication Administration: Whole meds with puree Supervision: Full supervision/cueing for compensatory strategies Compensations: Slow rate;Small sips/bites;Other (Comment) (check for residuals, use puree/liquid to aid oral clearance) Postural Changes: Remain upright for at least 30 minutes after po intake;Seated upright at 90 degrees    Other  Recommendations Oral Care Recommendations:  (AFTER ALL MEALS)   Follow up Recommendations        Frequency and Duration min 1 x/week  1 week       Prognosis Prognosis for Safe Diet Advancement: Fair Barriers to Reach Goals: Cognitive deficits;Time post onset      Swallow Study   General Date of Onset: 10/02/16 HPI: 83 yo female adm to [facility] from SNF with AMS, found to have UTI, sacral wound, sepsis, metabolic encephalopathy.  Pt has h/o progressive cognitive decline.  Swallow evaluation ordered, she is on puree diet at SNF.  CXR showed chronic lung disease with left upper lobe retraction but nothing acute.   Type of Study: Bedside Swallow Evaluation Diet Prior to this Study: NPO Temperature Spikes Noted: No Respiratory Status: Nasal cannula History of Recent Intubation: No  Behavior/Cognition: Alert;Cooperative Oral Cavity Assessment: Dry Oral Care Completed by SLP: No Oral Cavity - Dentition: Edentulous Vision: Impaired for self-feeding Self-Feeding Abilities: Total assist Patient Positioning: Upright in bed Baseline Vocal Quality: Normal Volitional Cough: Cognitively unable to elicit Volitional Swallow:  Unable to elicit    Oral/Motor/Sensory Function Overall Oral Motor/Sensory Function: Generalized oral weakness   Ice Chips Ice chips: Not tested   Thin Liquid Thin Liquid: Impaired Presentation: Straw;Cup Oral Phase Impairments: Reduced labial seal (labial spillage via cup only) Oral Phase Functional Implications: Right anterior spillage    Nectar Thick Nectar Thick Liquid: Not tested   Honey Thick Honey Thick Liquid: Not tested   Puree Puree: Impaired Oral Phase Functional Implications: Prolonged oral transit Other Comments: pt opens mouth only minimally to accept po and demonstrates delayed oral transiting   Solid   GO   Solid: Impaired Oral Phase Impairments: Impaired mastication;Reduced lingual movement/coordination Oral Phase Functional Implications: Prolonged oral transit;Impaired mastication Other Comments: pt demonstrates rotary mastication pattern - but it is lengthy with delay in transiting         Chales Abrahams 10/02/2016,11:40 AM Donavan Burnet, MS [facility] SLP [phone]

## 2016-10-02 NOTE — Progress Notes (Signed)
CSW assisting with dc planning. Family has chosen [facility] for rehab. SNF will have a bed tomorrow if stable for dc. Weekend CSW will assist with dc planning.   Cori Razor LCSW [phone]

## 2016-10-02 NOTE — Progress Notes (Signed)
[facility] PROGRESS NOTE  Elizabeth Keller RUE:[id number] DOB: 1923-11-03 DOA: 09/30/2016  PCP: Sandford Craze, NP  Brief History/Interval Summary: 83 year old African-American female with a past medical history of asthma, cognitive impairment, stage IV sacral decubitus, who lives in a skilled nursing facility and was been hospitalized 4 times this year. She was brought into the hospital, as apparently she was not her usual self and wasn't eating much over the past few days. She was found to have acute renal failure and some concern for sepsis. She was hospitalized for further management.  Reason for Visit: Acute renal failure. Sepsis.  Consultants: None  Procedures: None  Antibiotics: Vancomycin and Zosyn  Subjective/Interval History: Patient much more responsive today compared to yesterday. Denies any pain or discomfort. Her daughter is at the bedside.  ROS: No nausea or vomiting  Objective:  Vital Signs  Vitals:   10/01/16 1659 10/01/16 2143 10/02/16 0720 10/02/16 0721  BP: (!) 97/53 (!) 112/56  (!) 123/58  Pulse: 95 95  97  Resp:  20  20  Temp: 98 F (36.7 C) 97.6 F (36.4 C) 98 F (36.7 C) 98.6 F (37 C)  TempSrc: Axillary Axillary  Axillary  SpO2: 95% 91%  100%  Weight:      Height:        Intake/Output Summary (Last 24 hours) at 10/02/16 0859 Last data filed at 10/02/16 1478  Gross per 24 hour  Intake             1300 ml  Output              302 ml  Net              998 ml   Filed Weights   09/30/16 1539  Weight: 55 kg (121 lb 4.1 oz)    General appearance: Awake, alert. Responds more appropriately. In no distress. Resp: Clear to auscultation bilaterally Cardio: S1, S2 is normal, regular. No S3, S4. No rubs, murmurs or bruits GI: Abdomen is soft. Less tender today compared to yesterday. No rebound, rigidity. Bowel sounds are present. No masses or organomegaly. Foley catheter with blood-tinged urine. Extremities: No pedal edema. Skin: Stage  IV sacral decubitus with yellowish exudate. Neurologic: More awake and alert. Remains somewhat distracted. No obvious focal neurological deficits.  Lab Results:  Data Reviewed: I have personally reviewed following labs and imaging studies  CBC:  Recent Labs Lab 09/30/16 1234 10/01/16 0623 10/02/16 0526  WBC 14.1* 15.6* 15.7*  NEUTROABS 12.0*  --   --   HGB 9.5* 7.8* 7.9*  HCT 28.9* 23.7* 23.9*  MCV 80.7 81.2 79.9  PLT 446* 386 365    Basic Metabolic Panel:  Recent Labs Lab 09/30/16 1234 10/01/16 0623 10/02/16 0526  NA 142 146* 144  K 5.0 4.3 3.6  CL 105 112* 114*  CO2 25 26 23   GLUCOSE 251* 178* 138*  BUN 65* 60* 53*  CREATININE 3.36* 2.59* 2.23*  CALCIUM 8.2* 7.5* 7.5*    GFR: Estimated Creatinine Clearance: 14.2 mL/min (A) (by C-G formula based on SCr of 2.23 mg/dL (H)).  Liver Function Tests:  Recent Labs Lab 09/30/16 1234  AST 12*  ALT 7*  ALKPHOS 65  BILITOT 1.6*  PROT 6.1*  ALBUMIN 2.1*     Coagulation Profile:  Recent Labs Lab 09/30/16 1234  INR 1.73     Recent Results (from the past 240 hour(s))  Culture, blood (Routine x 2)     Status: None (Preliminary result)  Collection Time: 09/30/16 12:30 PM  Result Value Ref Range Status   Specimen Description BLOOD RIGHT ARM  Final   Special Requests   Final    BOTTLES DRAWN AEROBIC AND ANAEROBIC Blood Culture adequate volume   Culture   Final    NO GROWTH < 24 HOURS Performed at [facility], [address], [place], [place] [address]    Report Status PENDING  Incomplete  Culture, blood (Routine x 2)     Status: None (Preliminary result)   Collection Time: 09/30/16 12:32 PM  Result Value Ref Range Status   Specimen Description BLOOD LEFT HAND  Final   Special Requests   Final    BOTTLES DRAWN AEROBIC AND ANAEROBIC Blood Culture adequate volume   Culture   Final    NO GROWTH < 24 HOURS Performed at [facility], [address] [place], [place] [address]    Report  Status PENDING  Incomplete  Culture, Urine     Status: Abnormal (Preliminary result)   Collection Time: 09/30/16  4:36 PM  Result Value Ref Range Status   Specimen Description URINE, CATHETERIZED  Final   Special Requests NONE  Final   Culture (A)  Final    >=100,000 COLONIES/mL PROTEUS MIRABILIS SUSCEPTIBILITIES TO FOLLOW Performed at [facility], [address], [place], [place] [address]    Report Status PENDING  Incomplete  MRSA PCR Screening     Status: Abnormal   Collection Time: 09/30/16  6:10 PM  Result Value Ref Range Status   MRSA by PCR POSITIVE (A) NEGATIVE Final    Comment:        The GeneXpert MRSA Assay (FDA approved for NASAL specimens only), is one component of a comprehensive MRSA colonization surveillance program. It is not intended to diagnose MRSA infection nor to guide or monitor treatment for MRSA infections. RESULT CALLED TO, READ BACK BY AND VERIFIED WITH: PATRAM,L RN 8.23.18 @0006  ZANDO,C       Radiology Studies: Ct Abdomen Pelvis Wo Contrast  Result Date: 10/01/2016 CLINICAL DATA:  83 y/o  F; abdominal pain. EXAM: CT ABDOMEN AND PELVIS WITHOUT CONTRAST TECHNIQUE: Multidetector CT imaging of the abdomen and pelvis was performed following the standard protocol without IV contrast. COMPARISON:  CT abdomen and pelvis 04/03/16. FINDINGS: Lower chest: Small bilateral pleural effusions. Coronary artery calcifications. Hepatobiliary: No focal liver abnormality is seen. No gallstones, gallbladder wall thickening, or biliary dilatation. Pancreas: Unremarkable. No pancreatic ductal dilatation or surrounding inflammatory changes. Spleen: Normal in size without focal abnormality. Adrenals/Urinary Tract: Adrenal glands are unremarkable. Kidneys are normal, without renal calculi, focal lesion, or hydronephrosis. Bladder is unremarkable. Stomach/Bowel: Stomach is within normal limits. No findings of bowel obstruction. Diffuse wall thickening of the colon greatest  in cecum and ascending segments. Large volume of stool in the rectum. Vascular/Lymphatic: Aortic atherosclerosis. No enlarged abdominal or pelvic lymph nodes. Reproductive: Endometrial thickening 11 mm. No adnexal lesion identified. Other: No abdominal wall hernia or abnormality. No abdominopelvic ascites. Musculoskeletal: No fracture is seen. Advanced lumber degenerative changes greatest at the L4-5 level where there is advanced spinal canal stenosis. IMPRESSION: 1. Diffuse colitis greatest in cecum and ascending segments. 2. Large volume of stool in the rectum may represent constipation. 3. Small bilateral pleural effusions. Coronary artery calcifications. Aortic atherosclerosis. 4. Endometrial thickening 11 mm, given patient's age pelvic ultrasound is recommended for further evaluation on a nonemergent basis. 5. Advanced lumber degenerative changes greatest at the L4-5 level where there is  advanced spinal canal stenosis. Electronically Signed   By: Mitzi Hansen M.D.   On: 10/01/2016 17:12   Dg Chest Port 1 View  Result Date: 09/30/2016 CLINICAL DATA:  Altered mental status.  Unresponsive. EXAM: PORTABLE CHEST 1 VIEW COMPARISON:  06/29/2016 FINDINGS: Heart size is normal. Chronic aortic atherosclerosis. Chronic pleural and parenchymal scarring in the upper chest with chronic volume loss. No sign of active infiltrate, collapse or effusion. No acute bone finding. IMPRESSION: Chronic lung disease with upper lobe retraction. No active process identified. Electronically Signed   By: Paulina Fusi M.D.   On: 09/30/2016 12:47   Dg Abd Portable 2v  Result Date: 09/30/2016 CLINICAL DATA:  Abdominal tenderness. EXAM: PORTABLE ABDOMEN - 2 VIEW COMPARISON:  Radiograph of Jun 29, 2016. FINDINGS: The bowel gas pattern is normal. There is no evidence of free air on left lateral decubitus film. Atherosclerosis of abdominal aorta is noted. Phleboliths are noted in the pelvis. IMPRESSION: No evidence of bowel  obstruction or ileus. No pneumoperitoneum is noted. Aortic atherosclerosis. Electronically Signed   By: Lupita Raider, M.D.   On: 09/30/2016 17:41     Medications:  Scheduled: . Chlorhexidine Gluconate Cloth  6 each Topical Q0600  . collagenase   Topical Daily  . heparin  5,000 Units Subcutaneous Q8H  . mupirocin ointment  1 application Nasal BID  . polyethylene glycol  17 g Oral Daily  . sodium chloride flush  3 mL Intravenous Q12H  . sodium phosphate  1 enema Rectal Once   Continuous: . sodium chloride 75 mL/hr at 10/01/16 0653  . piperacillin-tazobactam (ZOSYN)  IV Stopped (10/02/16 0430)  . vancomycin     ZOX:[id number] **OR** acetaminophen, iopamidol, ondansetron **OR** ondansetron (ZOFRAN) IV  Assessment/Plan:  Principal Problem:   Sepsis (HCC) Active Problems:   Chronic kidney disease, stage III (moderate)   Severe protein-calorie malnutrition (HCC)   Stage IV pressure ulcer of sacral region [facility])   Cognitive impairment   Anemia, chronic disease   ARF (acute renal failure) (HCC)    Acute metabolic encephalopathy, Possibly due to renal failure. Sepsis could also be contributing. No obvious focal neurological deficits. Mental status is improving slowly. No focal neurological deficits. Continue to monitor for now.   Sepsis due to UTI or infected sacral decubitus/colitis Sepsis protocol was initiated. Lactic acid level was normal. Cultures have been negative so far. CT scan was done, which is suggested colitis. Severe constipation was also noted. Patient did have a large bowel movement. Her abdominal exam is improved today. Urine culture positive for Proteus. We will change it to ceftriaxone and also add Flagyl for now.   Acute renal failure on chronic kidney disease stage III/chronic indwelling Foley catheter/hematuria Creatinine is slowly improving. Creatinine was 0.6 in June. Acute renal failure most likely due to poor oral intake at the skilled nursing facility.  Continue IV fluids. Monitor urine output. Foley catheter was replaced 8/22. Some hematuria is present, likely due to trauma when Foley was inserted. Continue irrigating bladder. No abnormalities in the GU tract noted on the CT scan.  Sacral Decubitus, stage IV/Right heel pressure ulcer--stage 4-POA/ Left heel pressure ulcer/Unstageable Right trochanter-Deep tissue injury Sacral decubitus with some yellowish exudate, possibly suggesting infection. Wound care nurse has been consulted. Undergoing wound care therapy.  Anemia of chronic disease. Hemoglobin did drop but most likely due to dilution. Stable today. Transfuse if it drops below 7. Continue to monitor. No evidence for overt bleeding except for mild hematuria.   Severe  protein calorie malnutrition. Initiated diet when she is able to safely take orally. Speech therapy has been consulted.  Advanced degenerative changes with possible spinal stenosis This was incidentally noted on the CT scan. Patient has not ambulated since February. She is not a candidate for aggressive interventions due to her poor quality of life at baseline.  Endometrial thickening Noted incidentally on CT scan. Once again, not a candidate for aggressive interventions due to poor quality of life at baseline.  Goals of care. She is a limited code. Patient was seen in the palliative medicine during her last hospitalization. May have to revisit depending on our she does over the next 2-3 days.  DVT Prophylaxis: Subcutaneous heparin    Code Status: Partial code  Family Communication: Discussed with daughter at bedside. Disposition Plan: Management as outlined above.    LOS: 2 days   [facility]  [facility] Pager [phone] 10/02/2016, 8:59 AM  If 7PM-7AM, please contact night-coverage at [email or web address], password [facility]

## 2016-10-02 NOTE — Progress Notes (Signed)
PT HYDROTHERAPY EVAL AND TREATMENT   10/02/16 1300  Subjective Assessment  Patient and Family Stated Goals pt unable, family not present for session  Date of Onset (present on admission)  Prior Treatments dressing changes  Evaluation and Treatment  Evaluation and Treatment Procedures Explained to Patient/Family Yes  Evaluation and Treatment Procedures Patient unable to consent due to mental status  Pressure Injury 10/02/16 Stage IV - Full thickness tissue loss with exposed bone, tendon or muscle. *PT* sacral pressure injury  Date First Assessed/Time First Assessed: 10/02/16 1240   Location: Sacrum  Location Orientation: Mid  Staging: Stage IV - Full thickness tissue loss with exposed bone, tendon or muscle.  Wound Description (Comments): *PT* sacral pressure injury  Prese...  Dressing Type Moist to dry (santyl)  State of Healing Early/partial granulation  Site / Wound Assessment Brown;Granulation tissue;Pink;Yellow  % Wound base Red or Granulating 15%  % Wound base Yellow/Fibrinous Exudate 75%  % Wound base Black/Eschar 10%  Wound Length (cm) 8 cm  Wound Width (cm) 10 cm  Wound Depth (cm) 5 cm  Undermining (cm) 2cm undermining 11:00 to 2:00  Margins Unattached edges (unapproximated)  Drainage Amount Moderate  Drainage Description Serosanguineous;Other (Comment) (unbale to discern odor d/t pt incontinent of stool)  Treatment Debridement (Selective);Hydrotherapy (Pulse lavage);Packing (Saline gauze);Other (Comment) (santyl)  Hydrotherapy  Pulsed lavage therapy - wound location sacrum  Pulsed Lavage with Suction (psi) 8 psi  Pulsed Lavage with Suction - Normal Saline Used 1000 mL  Pulsed Lavage Tip Tip with splash shield  Selective Debridement  Selective Debridement - Location sacrum  Selective Debridement - Tools Used Forceps;Scissors  Selective Debridement - Tissue Removed eschar  Wound Therapy - Assess/Plan/Recommendations  Wound Therapy - Clinical Statement pt will benefit  from PT for hydrotherapy to facilitate wound healing  Wound Therapy - Functional Problem List pt is bedbound  Factors Delaying/Impairing Wound Healing Multiple medical problems  Hydrotherapy Plan Debridement;Dressing change;Patient/family education;Pulsatile lavage with suction  Wound Therapy - Frequency 6X / week  Wound Therapy - Follow Up Recommendations Skilled nursing facility  Wound Plan pulsed lavage 6x/wk to sacral pressure wound  Wound Therapy Goals - Improve the function of patient's integumentary system by progressing the wound(s) through the phases of wound healing by:  Decrease Necrotic Tissue to 75  Decrease Necrotic Tissue - Progress Goal set today  Increase Granulation Tissue to 25  Increase Granulation Tissue - Progress Goal set today  Improve Drainage Characteristics Min  Improve Drainage Characteristics - Progress Goal set today  Goals/treatment plan/discharge plan were made with and agreed upon by patient/family No, Patient unable to participate in goals/treatment/discharge plan and family unavailable  Time For Goal Achievement 2 weeks  Wound Therapy - Potential for Goals Poor  Drucilla Chalet, PT Pager: [phone] 10/02/2016

## 2016-10-03 LAB — BASIC METABOLIC PANEL
Anion gap: 8 (ref 5–15)
BUN: 47 mg/dL — AB (ref 6–20)
CALCIUM: 7.7 mg/dL — AB (ref 8.9–10.3)
CO2: 21 mmol/L — AB (ref 22–32)
CREATININE: 1.99 mg/dL — AB (ref 0.44–1.00)
Chloride: 115 mmol/L — ABNORMAL HIGH (ref 101–111)
GFR calc Af Amer: 24 mL/min — ABNORMAL LOW (ref >60)
GFR, EST NON AFRICAN AMERICAN: 21 mL/min — AB (ref >60)
GLUCOSE: 169 mg/dL — AB (ref 65–99)
Potassium: 3.3 mmol/L — ABNORMAL LOW (ref 3.5–5.1)
Sodium: 144 mmol/L (ref 135–145)

## 2016-10-03 LAB — CBC
HEMATOCRIT: 22.5 % — AB (ref 36.0–46.0)
Hemoglobin: 7.6 g/dL — ABNORMAL LOW (ref 12.0–15.0)
MCH: 27 pg (ref 26.0–34.0)
MCHC: 33.8 g/dL (ref 30.0–36.0)
MCV: 79.8 fL (ref 78.0–100.0)
PLATELETS: 376 10*3/uL (ref 150–400)
RBC: 2.82 MIL/uL — ABNORMAL LOW (ref 3.87–5.11)
RDW: 17.7 % — AB (ref 11.5–15.5)
WBC: 17.8 10*3/uL — AB (ref 4.0–10.5)

## 2016-10-03 MED ORDER — ALLOPURINOL 100 MG PO TABS
50.0000 mg | ORAL_TABLET | Freq: Every day | ORAL | Status: DC
  Administered 2016-10-03 – 2016-10-05 (×3): 50 mg via ORAL
  Filled 2016-10-03 (×3): qty 1

## 2016-10-03 MED ORDER — POTASSIUM CHLORIDE CRYS ER 20 MEQ PO TBCR
20.0000 meq | EXTENDED_RELEASE_TABLET | Freq: Once | ORAL | Status: AC
Start: 2016-10-03 — End: 2016-10-03
  Administered 2016-10-03: 20 meq via ORAL
  Filled 2016-10-03: qty 1

## 2016-10-03 MED ORDER — FUROSEMIDE 10 MG/ML IJ SOLN
20.0000 mg | Freq: Once | INTRAMUSCULAR | Status: AC
  Administered 2016-10-03: 20 mg via INTRAVENOUS
  Filled 2016-10-03: qty 2

## 2016-10-03 NOTE — Progress Notes (Addendum)
[facility] PROGRESS NOTE  Elizabeth Keller ZOX:[id number] DOB: 03-19-1923 DOA: 09/30/2016  PCP: Sandford Craze, NP  Brief History/Interval Summary: 83 year old African-American female with a past medical history of asthma, cognitive impairment, stage IV sacral decubitus, who lives in a skilled nursing facility and was been hospitalized 4 times this year. She was brought into the hospital, as apparently she was not her usual self and wasn't eating much over the past few days. She was found to have acute renal failure and concern for sepsis. She was hospitalized for further management.  Reason for Visit: Acute renal failure. Sepsis.  Consultants: None  Procedures: None  Antibiotics: Vancomycin and Zosyn Changed over to ceftriaxone and Flagyl on 8/24  Subjective/Interval History: Patient seems to be close to baseline per family in terms of mental status. She does respond more today than the last few days. Still distracted at times.   ROS: No nausea or vomiting.  Objective:  Vital Signs  Vitals:   10/02/16 1842 10/02/16 2126 10/03/16 0552 10/03/16 0859  BP: (!) 109/50 (!) 97/47 (!) 108/57   Pulse:  (!) 101 (!) 102   Resp: 16 16 (!) 22   Temp:   99.9 F (37.7 C) 98.6 F (37 C)  TempSrc:   Oral Oral  SpO2:  99% 98%   Weight:      Height:        Intake/Output Summary (Last 24 hours) at 10/03/16 1024 Last data filed at 10/03/16 0600  Gross per 24 hour  Intake          2086.25 ml  Output              500 ml  Net          1586.25 ml   Filed Weights   09/30/16 1539  Weight: 55 kg (121 lb 4.1 oz)    General appearance: Awake, alert. More responsive. No distress. Resp: Clear to auscultation bilaterally. No wheezing, rales or rhonchi. Cardio: S1, S2 is normal, regular. No S3, S4. No rubs, murmurs, or bruit. GI: Abdomen is soft. Less tender today compared to yesterday. No rebound, rigidity or guarding. No masses or organomegaly. Foley catheter with clear urine. No  blood noted today. Extremities: No edema Skin: Stage IV sacral decubitus Neurologic: Remains distracted. No obvious focal neurological deficits.  Lab Results:  Data Reviewed: I have personally reviewed following labs and imaging studies  CBC:  Recent Labs Lab 09/30/16 1234 10/01/16 0623 10/02/16 0526 10/03/16 0432  WBC 14.1* 15.6* 15.7* 17.8*  NEUTROABS 12.0*  --   --   --   HGB 9.5* 7.8* 7.9* 7.6*  HCT 28.9* 23.7* 23.9* 22.5*  MCV 80.7 81.2 79.9 79.8  PLT 446* 386 365 376    Basic Metabolic Panel:  Recent Labs Lab 09/30/16 1234 10/01/16 0623 10/02/16 0526 10/03/16 0432  NA 142 146* 144 144  K 5.0 4.3 3.6 3.3*  CL 105 112* 114* 115*  CO2 25 26 23  21*  GLUCOSE 251* 178* 138* 169*  BUN 65* 60* 53* 47*  CREATININE 3.36* 2.59* 2.23* 1.99*  CALCIUM 8.2* 7.5* 7.5* 7.7*    GFR: Estimated Creatinine Clearance: 15.9 mL/min (A) (by C-G formula based on SCr of 1.99 mg/dL (H)).  Liver Function Tests:  Recent Labs Lab 09/30/16 1234  AST 12*  ALT 7*  ALKPHOS 65  BILITOT 1.6*  PROT 6.1*  ALBUMIN 2.1*     Coagulation Profile:  Recent Labs Lab 09/30/16 1234  INR 1.73  Recent Results (from the past 240 hour(s))  Culture, blood (Routine x 2)     Status: None (Preliminary result)   Collection Time: 09/30/16 12:30 PM  Result Value Ref Range Status   Specimen Description BLOOD RIGHT ARM  Final   Special Requests   Final    BOTTLES DRAWN AEROBIC AND ANAEROBIC Blood Culture adequate volume   Culture NO GROWTH 3 DAYS  Final   Report Status PENDING  Incomplete  Culture, blood (Routine x 2)     Status: None (Preliminary result)   Collection Time: 09/30/16 12:32 PM  Result Value Ref Range Status   Specimen Description BLOOD LEFT HAND  Final   Special Requests   Final    BOTTLES DRAWN AEROBIC AND ANAEROBIC Blood Culture adequate volume   Culture NO GROWTH 3 DAYS  Final   Report Status PENDING  Incomplete  Culture, Urine     Status: Abnormal   Collection  Time: 09/30/16  4:36 PM  Result Value Ref Range Status   Specimen Description URINE, CATHETERIZED  Final   Special Requests NONE  Final   Culture >=100,000 COLONIES/mL PROTEUS MIRABILIS (A)  Final   Report Status 10/02/2016 FINAL  Final   Organism ID, Bacteria PROTEUS MIRABILIS (A)  Final      Susceptibility   Proteus mirabilis - MIC*    AMPICILLIN <=2 SENSITIVE Sensitive     CEFAZOLIN <=4 SENSITIVE Sensitive     CEFTRIAXONE <=1 SENSITIVE Sensitive     CIPROFLOXACIN <=0.25 SENSITIVE Sensitive     GENTAMICIN <=1 SENSITIVE Sensitive     IMIPENEM 2 SENSITIVE Sensitive     NITROFURANTOIN 128 RESISTANT Resistant     TRIMETH/SULFA <=20 SENSITIVE Sensitive     AMPICILLIN/SULBACTAM <=2 SENSITIVE Sensitive     PIP/TAZO <=4 SENSITIVE Sensitive     * >=100,000 COLONIES/mL PROTEUS MIRABILIS  MRSA PCR Screening     Status: Abnormal   Collection Time: 09/30/16  6:10 PM  Result Value Ref Range Status   MRSA by PCR POSITIVE (A) NEGATIVE Final    Comment:        The GeneXpert MRSA Assay (FDA approved for NASAL specimens only), is one component of a comprehensive MRSA colonization surveillance program. It is not intended to diagnose MRSA infection nor to guide or monitor treatment for MRSA infections. RESULT CALLED TO, READ BACK BY AND VERIFIED WITH: PATRAM,L RN 8.23.18 @0006  ZANDO,C       Radiology Studies: Ct Abdomen Pelvis Wo Contrast  Result Date: 10/01/2016 CLINICAL DATA:  83 y/o  F; abdominal pain. EXAM: CT ABDOMEN AND PELVIS WITHOUT CONTRAST TECHNIQUE: Multidetector CT imaging of the abdomen and pelvis was performed following the standard protocol without IV contrast. COMPARISON:  CT abdomen and pelvis 04/03/16. FINDINGS: Lower chest: Small bilateral pleural effusions. Coronary artery calcifications. Hepatobiliary: No focal liver abnormality is seen. No gallstones, gallbladder wall thickening, or biliary dilatation. Pancreas: Unremarkable. No pancreatic ductal dilatation or surrounding  inflammatory changes. Spleen: Normal in size without focal abnormality. Adrenals/Urinary Tract: Adrenal glands are unremarkable. Kidneys are normal, without renal calculi, focal lesion, or hydronephrosis. Bladder is unremarkable. Stomach/Bowel: Stomach is within normal limits. No findings of bowel obstruction. Diffuse wall thickening of the colon greatest in cecum and ascending segments. Large volume of stool in the rectum. Vascular/Lymphatic: Aortic atherosclerosis. No enlarged abdominal or pelvic lymph nodes. Reproductive: Endometrial thickening 11 mm. No adnexal lesion identified. Other: No abdominal wall hernia or abnormality. No abdominopelvic ascites. Musculoskeletal: No fracture is seen. Advanced lumber degenerative changes greatest  at the L4-5 level where there is advanced spinal canal stenosis. IMPRESSION: 1. Diffuse colitis greatest in cecum and ascending segments. 2. Large volume of stool in the rectum may represent constipation. 3. Small bilateral pleural effusions. Coronary artery calcifications. Aortic atherosclerosis. 4. Endometrial thickening 11 mm, given patient's age pelvic ultrasound is recommended for further evaluation on a nonemergent basis. 5. Advanced lumber degenerative changes greatest at the L4-5 level where there is advanced spinal canal stenosis. Electronically Signed   By: Mitzi Hansen M.D.   On: 10/01/2016 17:12     Medications:  Scheduled: . allopurinol  50 mg Oral Daily  . Chlorhexidine Gluconate Cloth  6 each Topical Q0600  . collagenase   Topical Daily  . heparin  5,000 Units Subcutaneous Q8H  . mupirocin ointment  1 application Nasal BID  . polyethylene glycol  17 g Oral Daily  . potassium chloride  20 mEq Oral Once  . sodium chloride flush  3 mL Intravenous Q12H  . sodium phosphate  1 enema Rectal Once   Continuous: . sodium chloride 75 mL/hr at 10/03/16 0244  . cefTRIAXone (ROCEPHIN)  IV Stopped (10/02/16 1342)  . metronidazole Stopped (10/03/16  0545)   GGY:IRSWNIOEVOJJK **OR** acetaminophen, iopamidol, ondansetron **OR** ondansetron (ZOFRAN) IV  Assessment/Plan:  Principal Problem:   Sepsis (HCC) Active Problems:   Chronic kidney disease, stage III (moderate)   Severe protein-calorie malnutrition (HCC)   Stage IV pressure ulcer of sacral region [facility])   Cognitive impairment   Anemia, chronic disease   ARF (acute renal failure) (HCC)    Acute metabolic encephalopathy, Possibly due to renal failure. Sepsis could also be contributing. No obvious focal neurological deficits. Mental status seems to be close to baseline now.   Sepsis due to UTI or infected sacral decubitus/colitis Sepsis protocol was initiated. Lactic acid level was normal. Cultures have been negative so far. CT abdomen pelvis was done, which suggested colitis. Severe constipation was also noted. Patient did have a large bowel movement. Her abdomen remains benign. Urine culture positive for Proteus. Continue ceftriaxone and Flagyl for now.   Acute renal failure on chronic kidney disease stage III/chronic indwelling Foley catheter/hematuria Creatinine is slowly improving. Creatinine was 0.6 in June. Acute renal failure most likely due to poor oral intake at the skilled nursing facility. Continue IV fluids. Urine output noted to be low over the last 2 days. Unclear if this is not being charted appropriately even though she has a Foley catheter. Foley to be flushed. Her weight measurement today. Hematuria has subsided which was likely due to trauma when Foley was inserted. Continue irrigating bladder. No abnormalities in the GU tract noted on the CT scan.  Sacral Decubitus, stage IV/Right heel pressure ulcer--stage 4-POA/ Left heel pressure ulcer/Unstageable Right trochanter-Deep tissue injury Sacral decubitus with some yellowish exudate, possibly suggesting infection. Wound care nurse has been consulted. Undergoing wound care.  Chronic diastolic CHF Patient used to be  on torsemide. Previous records reviewed. It looks like torsemide was discontinued on June 13 of this year. May need to resume diuretics.  Anemia of chronic disease. Hemoglobin did drop but most likely due to dilution. Remains stable. Transfuse if it drops below 7. Continue to monitor. No evidence for overt bleeding except for mild hematuria.   Severe protein calorie malnutrition. Patient has been placed on a diet. Encourage oral intake.  Advanced degenerative changes with possible spinal stenosis This was incidentally noted on the CT scan. Patient has not ambulated since February. She is not a  candidate for aggressive interventions due to her poor quality of life at baseline.  Endometrial thickening Noted incidentally on CT scan. Once again, not a candidate for aggressive interventions due to poor quality of life at baseline. This can be pursued as an outpatient.  Goals of care. She is a limited code. Patient was seen in the palliative medicine during her last hospitalization.  DVT Prophylaxis: Subcutaneous heparin    Code Status: Partial code  Family Communication: Discussed with daughter at bedside. Disposition Plan: Management as outlined above.     LOS: 3 days   [facility]  [facility] Pager [phone] 10/03/2016, 10:24 AM  If 7PM-7AM, please contact night-coverage at [email or web address], password [facility]

## 2016-10-03 NOTE — Progress Notes (Signed)
PT HYDROTHERAPY TREATMENT NOTE  10/03/16 1329  Subjective Assessment  Patient and Family Stated Goals pt unable, family not present for session  Prior Treatments dressing changes  Evaluation and Treatment  Evaluation and Treatment Procedures Explained to Patient/Family Yes  Evaluation and Treatment Procedures Patient unable to consent due to mental status  Pressure Injury 10/02/16 Stage IV - Full thickness tissue loss with exposed bone, tendon or muscle. *PT* sacral pressure injury  Date First Assessed/Time First Assessed: 10/02/16 1240   Location: Sacrum  Location Orientation: Mid  Staging: Stage IV - Full thickness tissue loss with exposed bone, tendon or muscle.  Wound Description (Comments): *PT* sacral pressure injury  Prese...  Dressing Type Moist to dry (santyl)  State of Healing Early/partial granulation  Site / Wound Assessment Brown;Granulation tissue;Pink;Yellow  % Wound base Red or Granulating 20%  % Wound base Yellow/Fibrinous Exudate 80%  % Wound base Black/Eschar 5%  % Wound base Other/Granulation Tissue (Comment) (small area of partailly loose bone exposed)  Peri-wound Assessment Intact  Wound Length (cm) (see eval)  Drainage Amount Moderate  Drainage Description Serosanguineous;Other (Comment)  Treatment Debridement (Selective);Cleansed;Hydrotherapy (Pulse lavage);Packing (Saline gauze) (santyl)  Hydrotherapy  Pulsed lavage therapy - wound location sacrum  Pulsed Lavage with Suction (psi) 8 psi  Pulsed Lavage with Suction - Normal Saline Used 1000 mL  Pulsed Lavage Tip Tip with splash shield  Selective Debridement  Selective Debridement - Location sacrum  Selective Debridement - Tools Used Forceps;Scissors  Selective Debridement - Tissue Removed eschar and slough  Wound Therapy - Assess/Plan/Recommendations  Wound Therapy - Clinical Statement pt will benefit from PT for hydrotherapy to facilitate wound healing  Wound Therapy - Functional Problem List pt is bedbound   Factors Delaying/Impairing Wound Healing Multiple medical problems  Hydrotherapy Plan Debridement;Dressing change;Patient/family education;Pulsatile lavage with suction  Wound Therapy - Frequency 6X / week  Wound Therapy - Follow Up Recommendations Skilled nursing facility  Wound Plan pulsed lavage 6x/wk to sacral pressure wound  Wound Therapy Goals - Improve the function of patient's integumentary system by progressing the wound(s) through the phases of wound healing by:  Decrease Necrotic Tissue to 75  Decrease Necrotic Tissue - Progress Progressing toward goal  Increase Granulation Tissue to 25  Increase Granulation Tissue - Progress Progressing toward goal  Improve Drainage Characteristics Min  Improve Drainage Characteristics - Progress Progressing toward goal  Goals/treatment plan/discharge plan were made with and agreed upon by patient/family No, Patient unable to participate in goals/treatment/discharge plan and family unavailable  Time For Goal Achievement 2 weeks  Wound Therapy - Potential for Goals Poor  Drucilla Chalet, PT Pager: [phone] 10/03/2016

## 2016-10-04 DIAGNOSIS — I5032 Chronic diastolic (congestive) heart failure: Secondary | ICD-10-CM

## 2016-10-04 LAB — BASIC METABOLIC PANEL
Anion gap: 6 (ref 5–15)
BUN: 39 mg/dL — ABNORMAL HIGH (ref 6–20)
CHLORIDE: 115 mmol/L — AB (ref 101–111)
CO2: 25 mmol/L (ref 22–32)
Calcium: 8.4 mg/dL — ABNORMAL LOW (ref 8.9–10.3)
Creatinine, Ser: 1.69 mg/dL — ABNORMAL HIGH (ref 0.44–1.00)
GFR, EST AFRICAN AMERICAN: 29 mL/min — AB (ref >60)
GFR, EST NON AFRICAN AMERICAN: 25 mL/min — AB (ref >60)
Glucose, Bld: 177 mg/dL — ABNORMAL HIGH (ref 65–99)
POTASSIUM: 2.9 mmol/L — AB (ref 3.5–5.1)
SODIUM: 146 mmol/L — AB (ref 135–145)

## 2016-10-04 LAB — CBC
HCT: 23.8 % — ABNORMAL LOW (ref 36.0–46.0)
HEMOGLOBIN: 7.9 g/dL — AB (ref 12.0–15.0)
MCH: 26.3 pg (ref 26.0–34.0)
MCHC: 33.2 g/dL (ref 30.0–36.0)
MCV: 79.3 fL (ref 78.0–100.0)
PLATELETS: 367 10*3/uL (ref 150–400)
RBC: 3 MIL/uL — AB (ref 3.87–5.11)
RDW: 17.5 % — ABNORMAL HIGH (ref 11.5–15.5)
WBC: 11.3 10*3/uL — AB (ref 4.0–10.5)

## 2016-10-04 MED ORDER — POTASSIUM CHLORIDE CRYS ER 20 MEQ PO TBCR
40.0000 meq | EXTENDED_RELEASE_TABLET | Freq: Two times a day (BID) | ORAL | Status: AC
  Administered 2016-10-04 (×2): 40 meq via ORAL
  Filled 2016-10-04 (×2): qty 2

## 2016-10-04 MED ORDER — FUROSEMIDE 20 MG PO TABS
20.0000 mg | ORAL_TABLET | Freq: Every day | ORAL | Status: DC
  Administered 2016-10-05: 20 mg via ORAL
  Filled 2016-10-04: qty 1

## 2016-10-04 MED ORDER — CEFPODOXIME PROXETIL 200 MG PO TABS
200.0000 mg | ORAL_TABLET | ORAL | Status: DC
  Administered 2016-10-04 – 2016-10-05 (×2): 200 mg via ORAL
  Filled 2016-10-04 (×3): qty 1

## 2016-10-04 MED ORDER — METRONIDAZOLE 500 MG PO TABS
500.0000 mg | ORAL_TABLET | Freq: Three times a day (TID) | ORAL | Status: DC
  Administered 2016-10-04 – 2016-10-05 (×3): 500 mg via ORAL
  Filled 2016-10-04 (×4): qty 1

## 2016-10-04 MED ORDER — FUROSEMIDE 10 MG/ML IJ SOLN
20.0000 mg | Freq: Once | INTRAMUSCULAR | Status: AC
  Administered 2016-10-04: 20 mg via INTRAVENOUS
  Filled 2016-10-04: qty 2

## 2016-10-04 NOTE — Progress Notes (Signed)
PHARMACY NOTE:  ANTIMICROBIAL RENAL DOSAGE ADJUSTMENT  Current antimicrobial regimen includes a mismatch between antimicrobial dosage and estimated renal function.  As per policy approved by the Pharmacy & Therapeutics and Medical Executive Committees, the antimicrobial dosage will be adjusted accordingly.  Current antimicrobial dosage: Vantin 200mg  PO BID  Indication: UTI, decubitus ulcer  Renal Function:  Estimated Creatinine Clearance: 18.7 mL/min (A) (by C-G formula based on SCr of 1.69 mg/dL (H)). []      On intermittent HD, scheduled: []      On CRRT    Antimicrobial dosage has been changed to:  Vanctin 200mg  PO DAILY for CrCl<57ml/min  Additional comments:   Thank you for allowing pharmacy to be a part of this patient's care.  Elson Clan, [facility] 10/04/2016 9:59 AM

## 2016-10-04 NOTE — Plan of Care (Signed)
Problem: Fluid Volume: Goal: Hemodynamic stability will improve Outcome: Progressing .  Problem: Skin Integrity: Goal: Risk for impaired skin integrity will decrease Outcome: Progressing Dressing changes and wound care as ordered  Problem: Fluid Volume: Goal: Ability to maintain a balanced intake and output will improve Outcome: Progressing Diuresing   Problem: Nutrition: Goal: Adequate nutrition will be maintained Outcome: Not Progressing Poor PO intake.

## 2016-10-04 NOTE — Progress Notes (Signed)
CSW met with patient's daughter, Avie Arenas, at bedside to discuss concerns about patient's wound care moving forward as she transitions to SNF. Patient's daughter was curious if patient could obtain follow-up wound care at the [facility] while she's admitted to SNF for rehab; the [facility] would pick up from SNF and take back after appointment. Patient's daughter discussed concerns about how the patient's wound is becoming worse and the need for specialized medical attention to ensure that it heals.   CSW to contact facility representative to determine if this is possible at discharge. Patient's daughter indicated that previously, she had been going once every week or every other week to the [facility] and the wound had been healing well.   Laveda Abbe, [place] Clinical Social Worker [phone]

## 2016-10-04 NOTE — Progress Notes (Signed)
[facility] PROGRESS NOTE  Rolene Andrades ZOX:[id number] DOB: Nov 20, 1923 DOA: 09/30/2016  PCP: Sandford Craze, NP  Brief History/Interval Summary: 83 year old African-American female with a past medical history of asthma, cognitive impairment, stage IV sacral decubitus, who lives in a skilled nursing facility and was been hospitalized 4 times this year. She was brought into the hospital, as apparently she was not her usual self and wasn't eating much over the past few days. She was found to have acute renal failure and there was concern for sepsis. She was hospitalized for further management.  Reason for Visit: Acute renal failure. Sepsis.  Consultants: None  Procedures: None  Antibiotics: Vancomycin and Zosyn Changed over to ceftriaxone and Flagyl on 8/24 Changed over to Vantin and oral Flagyl 8/26  Subjective/Interval History: Patient more responsive today. Denies any complaints. States that she is feeling well. Her daughter is at the bedside. Oral intake remains poor at times. Patient needs a lot of encouragement.   ROS: No nausea or vomiting.  Objective:  Vital Signs  Vitals:   10/03/16 1421 10/03/16 2036 10/04/16 0500 10/04/16 0629  BP: 131/73 133/79  133/70  Pulse: (!) 103 97  94  Resp: 16 18  20   Temp: 98.7 F (37.1 C) 98.4 F (36.9 C)  97.8 F (36.6 C)  TempSrc: Axillary Axillary  Axillary  SpO2: 99% 98%  100%  Weight:   59 kg (130 lb 1.1 oz)   Height:        Intake/Output Summary (Last 24 hours) at 10/04/16 0947 Last data filed at 10/04/16 0610  Gross per 24 hour  Intake          1262.42 ml  Output             1275 ml  Net           -12.58 ml   Filed Weights   09/30/16 1539 10/03/16 1311 10/04/16 0500  Weight: 55 kg (121 lb 4.1 oz) 62.4 kg (137 lb 9.1 oz) 59 kg (130 lb 1.1 oz)    General appearance: Awake, alert. More responsive. No distress. Resp: Clear to auscultation bilaterally Cardio: S1, S2 is normal, regular. No S3, S4. No rubs,  murmurs, or bruit GI: Abdomen is soft. Less tender today. No rebound, rigidity or guarding. No masses or organomegaly. Foley catheter with clear yellow urine. Extremities: No edema Skin: Stage IV sacral decubitus Neurologic: Mentation has improved over the last few days.  Lab Results:  Data Reviewed: I have personally reviewed following labs and imaging studies  CBC:  Recent Labs Lab 09/30/16 1234 10/01/16 0623 10/02/16 0526 10/03/16 0432 10/04/16 0539  WBC 14.1* 15.6* 15.7* 17.8* 11.3*  NEUTROABS 12.0*  --   --   --   --   HGB 9.5* 7.8* 7.9* 7.6* 7.9*  HCT 28.9* 23.7* 23.9* 22.5* 23.8*  MCV 80.7 81.2 79.9 79.8 79.3  PLT 446* 386 365 376 367    Basic Metabolic Panel:  Recent Labs Lab 09/30/16 1234 10/01/16 0623 10/02/16 0526 10/03/16 0432 10/04/16 0539  NA 142 146* 144 144 146*  K 5.0 4.3 3.6 3.3* 2.9*  CL 105 112* 114* 115* 115*  CO2 25 26 23  21* 25  GLUCOSE 251* 178* 138* 169* 177*  BUN 65* 60* 53* 47* 39*  CREATININE 3.36* 2.59* 2.23* 1.99* 1.69*  CALCIUM 8.2* 7.5* 7.5* 7.7* 8.4*    GFR: Estimated Creatinine Clearance: 18.7 mL/min (A) (by C-G formula based on SCr of 1.69 mg/dL (H)).  Liver Function Tests:  Recent Labs Lab 09/30/16 1234  AST 12*  ALT 7*  ALKPHOS 65  BILITOT 1.6*  PROT 6.1*  ALBUMIN 2.1*     Coagulation Profile:  Recent Labs Lab 09/30/16 1234  INR 1.73     Recent Results (from the past 240 hour(s))  Culture, blood (Routine x 2)     Status: None (Preliminary result)   Collection Time: 09/30/16 12:30 PM  Result Value Ref Range Status   Specimen Description BLOOD RIGHT ARM  Final   Special Requests   Final    BOTTLES DRAWN AEROBIC AND ANAEROBIC Blood Culture adequate volume   Culture   Final    NO GROWTH 4 DAYS Performed at [facility], [address], [place], [place] [address]    Report Status PENDING  Incomplete  Culture, blood (Routine x 2)     Status: None (Preliminary result)   Collection Time: 09/30/16  12:32 PM  Result Value Ref Range Status   Specimen Description BLOOD LEFT HAND  Final   Special Requests   Final    BOTTLES DRAWN AEROBIC AND ANAEROBIC Blood Culture adequate volume   Culture   Final    NO GROWTH 4 DAYS Performed at [facility], [address] [place], [place] [address]    Report Status PENDING  Incomplete  Culture, Urine     Status: Abnormal   Collection Time: 09/30/16  4:36 PM  Result Value Ref Range Status   Specimen Description URINE, CATHETERIZED  Final   Special Requests NONE  Final   Culture >=100,000 COLONIES/mL PROTEUS MIRABILIS (A)  Final   Report Status 10/02/2016 FINAL  Final   Organism ID, Bacteria PROTEUS MIRABILIS (A)  Final      Susceptibility   Proteus mirabilis - MIC*    AMPICILLIN <=2 SENSITIVE Sensitive     CEFAZOLIN <=4 SENSITIVE Sensitive     CEFTRIAXONE <=1 SENSITIVE Sensitive     CIPROFLOXACIN <=0.25 SENSITIVE Sensitive     GENTAMICIN <=1 SENSITIVE Sensitive     IMIPENEM 2 SENSITIVE Sensitive     NITROFURANTOIN 128 RESISTANT Resistant     TRIMETH/SULFA <=20 SENSITIVE Sensitive     AMPICILLIN/SULBACTAM <=2 SENSITIVE Sensitive     PIP/TAZO <=4 SENSITIVE Sensitive     * >=100,000 COLONIES/mL PROTEUS MIRABILIS  MRSA PCR Screening     Status: Abnormal   Collection Time: 09/30/16  6:10 PM  Result Value Ref Range Status   MRSA by PCR POSITIVE (A) NEGATIVE Final    Comment:        The GeneXpert MRSA Assay (FDA approved for NASAL specimens only), is one component of a comprehensive MRSA colonization surveillance program. It is not intended to diagnose MRSA infection nor to guide or monitor treatment for MRSA infections. RESULT CALLED TO, READ BACK BY AND VERIFIED WITH: PATRAM,L RN 8.23.18 @0006  ZANDO,C       Radiology Studies: No results found.   Medications:  Scheduled: . allopurinol  50 mg Oral Daily  . cefpodoxime  200 mg Oral Q12H  . Chlorhexidine Gluconate Cloth  6 each Topical Q0600  . collagenase   Topical  Daily  . [START ON 10/05/2016] furosemide  20 mg Oral Daily  . heparin  5,000 Units Subcutaneous Q8H  . metroNIDAZOLE  500 mg Oral Q8H  . mupirocin ointment  1 application Nasal BID  . polyethylene glycol  17 g Oral Daily  . potassium chloride  40 mEq Oral BID  . sodium chloride flush  3 mL  Intravenous Q12H  . sodium phosphate  1 enema Rectal Once   Continuous: . sodium chloride 10 mL/hr at 10/03/16 1900   NWG:[id number] **OR** acetaminophen, iopamidol, ondansetron **OR** ondansetron (ZOFRAN) IV  Assessment/Plan:  Principal Problem:   Sepsis (HCC) Active Problems:   Chronic kidney disease, stage III (moderate)   Severe protein-calorie malnutrition (HCC)   Stage IV pressure ulcer of sacral region [facility])   Cognitive impairment   Anemia, chronic disease   ARF (acute renal failure) (HCC)    Acute metabolic encephalopathy, Possibly due to renal failure. Sepsis could also be contributing. No obvious focal neurological deficits. Mental status seems to be close to baseline now.   Sepsis due to UTI or infected sacral decubitus/colitis Sepsis protocol was initiated. Lactic acid level was normal. Cultures have been negative so far. CT abdomen pelvis was done, which suggested colitis. Severe constipation was also noted. Patient did have a large bowel movement. Her abdomen remains benign. Urine culture positive for Proteus. Changed over to Vantin and oral Flagyl.  Acute renal failure on chronic kidney disease stage III/chronic indwelling Foley catheter/hematuria Creatinine is slowly improving. Creatinine was 0.6 in June. Acute renal failure most likely due to poor oral intake at the skilled nursing facility. She was given IV fluids initially. Renal function started improving and then plateaud. Urine output noted to be low. Her weight had increased. She was given Lasix with brisk response. See below under "chronic diastolic CHF". Hematuria has subsided which was likely due to trauma when Foley  was inserted. Continue irrigating bladder. No abnormalities in the GU tract noted on the CT scan.  Sacral Decubitus, stage IV/Right heel pressure ulcer--stage 4-POA/ Left heel pressure ulcer/Unstageable Right trochanter-Deep tissue injury Sacral decubitus with some yellowish exudate, possibly suggesting infection. Wound care nurse has been consulted. Undergoing wound care.  Chronic diastolic CHF Patient used to be on torsemide. Previous records reviewed. It looks like torsemide was discontinued on June 13 of this year. Patient given Lasix with good diuresis. Will be given additional intravenous dose today and then can be placed on oral dose from tomorrow. Weight is decreasing.  Hypokalemia. Secondary to diuresis. This will be repleted. Recheck labs tomorrow.  Anemia of chronic disease. Hemoglobin did drop but most likely due to dilution. Remains stable. Transfuse if it drops below 7. Continue to monitor. No evidence for overt bleeding except for mild hematuria.   Severe protein calorie malnutrition. Patient has been placed on a diet. Encourage oral intake.  Advanced degenerative changes with possible spinal stenosis This was incidentally noted on the CT scan. Patient has not ambulated since February. She is not a candidate for aggressive interventions due to her poor quality of life at baseline.  Endometrial thickening Noted incidentally on CT scan. Once again, not a candidate for aggressive interventions due to poor quality of life at baseline. This can be pursued as an outpatient.  Goals of care. She is a limited code. Patient was seen by palliative medicine during her last hospitalization. This can be further pursued at the skilled nursing facility.  DVT Prophylaxis: Subcutaneous heparin    Code Status: Partial code  Family Communication: Discussed with daughter at bedside. Disposition Plan: Management as outlined above. Anticipate discharge tomorrow.    LOS: 4 days    [facility]  [facility] Pager [phone] 10/04/2016, 9:47 AM  If 7PM-7AM, please contact night-coverage at [email or web address], password [facility]

## 2016-10-04 NOTE — Progress Notes (Signed)
Spoke with service response d/t patients diet.Pt is unable to chew food.  Pt does not have dentures, will order DYS1 to assist.

## 2016-10-04 NOTE — Progress Notes (Signed)
Assumed care of the pat from off going RN. Condition stable at this time cont with plan of care. No changes in initial am assessment  

## 2016-10-05 DIAGNOSIS — M25539 Pain in unspecified wrist: Secondary | ICD-10-CM | POA: Diagnosis not present

## 2016-10-05 DIAGNOSIS — L89154 Pressure ulcer of sacral region, stage 4: Secondary | ICD-10-CM | POA: Diagnosis not present

## 2016-10-05 DIAGNOSIS — E43 Unspecified severe protein-calorie malnutrition: Secondary | ICD-10-CM | POA: Diagnosis not present

## 2016-10-05 DIAGNOSIS — A419 Sepsis, unspecified organism: Secondary | ICD-10-CM | POA: Diagnosis not present

## 2016-10-05 DIAGNOSIS — M109 Gout, unspecified: Secondary | ICD-10-CM | POA: Diagnosis not present

## 2016-10-05 DIAGNOSIS — N390 Urinary tract infection, site not specified: Secondary | ICD-10-CM

## 2016-10-05 DIAGNOSIS — K219 Gastro-esophageal reflux disease without esophagitis: Secondary | ICD-10-CM | POA: Diagnosis not present

## 2016-10-05 DIAGNOSIS — R488 Other symbolic dysfunctions: Secondary | ICD-10-CM | POA: Diagnosis not present

## 2016-10-05 DIAGNOSIS — R1312 Dysphagia, oropharyngeal phase: Secondary | ICD-10-CM | POA: Diagnosis not present

## 2016-10-05 DIAGNOSIS — N183 Chronic kidney disease, stage 3 (moderate): Secondary | ICD-10-CM | POA: Diagnosis not present

## 2016-10-05 DIAGNOSIS — I5032 Chronic diastolic (congestive) heart failure: Secondary | ICD-10-CM | POA: Diagnosis not present

## 2016-10-05 DIAGNOSIS — M533 Sacrococcygeal disorders, not elsewhere classified: Secondary | ICD-10-CM | POA: Diagnosis not present

## 2016-10-05 DIAGNOSIS — L89620 Pressure ulcer of left heel, unstageable: Secondary | ICD-10-CM | POA: Diagnosis not present

## 2016-10-05 DIAGNOSIS — N179 Acute kidney failure, unspecified: Secondary | ICD-10-CM | POA: Diagnosis not present

## 2016-10-05 DIAGNOSIS — M6281 Muscle weakness (generalized): Secondary | ICD-10-CM | POA: Diagnosis not present

## 2016-10-05 DIAGNOSIS — L89610 Pressure ulcer of right heel, unstageable: Secondary | ICD-10-CM | POA: Diagnosis not present

## 2016-10-05 DIAGNOSIS — R4182 Altered mental status, unspecified: Secondary | ICD-10-CM | POA: Diagnosis not present

## 2016-10-05 DIAGNOSIS — R6520 Severe sepsis without septic shock: Secondary | ICD-10-CM | POA: Diagnosis not present

## 2016-10-05 LAB — BASIC METABOLIC PANEL
ANION GAP: 7 (ref 5–15)
BUN: 32 mg/dL — ABNORMAL HIGH (ref 6–20)
CALCIUM: 8.5 mg/dL — AB (ref 8.9–10.3)
CO2: 24 mmol/L (ref 22–32)
Chloride: 114 mmol/L — ABNORMAL HIGH (ref 101–111)
Creatinine, Ser: 1.53 mg/dL — ABNORMAL HIGH (ref 0.44–1.00)
GFR, EST AFRICAN AMERICAN: 33 mL/min — AB (ref >60)
GFR, EST NON AFRICAN AMERICAN: 29 mL/min — AB (ref >60)
Glucose, Bld: 161 mg/dL — ABNORMAL HIGH (ref 65–99)
Potassium: 3.3 mmol/L — ABNORMAL LOW (ref 3.5–5.1)
SODIUM: 145 mmol/L (ref 135–145)

## 2016-10-05 LAB — CULTURE, BLOOD (ROUTINE X 2)
CULTURE: NO GROWTH
Culture: NO GROWTH
SPECIAL REQUESTS: ADEQUATE
Special Requests: ADEQUATE

## 2016-10-05 MED ORDER — TRAMADOL HCL 50 MG PO TABS: 50.0000 mg | ORAL_TABLET | Freq: Four times a day (QID) | ORAL | 0 refills | Status: AC | PRN

## 2016-10-05 MED ORDER — POTASSIUM CHLORIDE CRYS ER 20 MEQ PO TBCR: 20.0000 meq | EXTENDED_RELEASE_TABLET | Freq: Every day | ORAL | 0 refills | Status: AC

## 2016-10-05 MED ORDER — POLYETHYLENE GLYCOL 3350 17 G PO PACK: 17.0000 g | PACK | Freq: Every day | ORAL | 0 refills | Status: AC

## 2016-10-05 MED ORDER — FUROSEMIDE 20 MG PO TABS: 20.0000 mg | ORAL_TABLET | Freq: Every day | ORAL | Status: DC

## 2016-10-05 MED ORDER — POTASSIUM CHLORIDE 20 MEQ/15ML (10%) PO SOLN
40.0000 meq | Freq: Once | ORAL | Status: AC
  Administered 2016-10-05: 40 meq via ORAL
  Filled 2016-10-05: qty 30

## 2016-10-05 MED ORDER — POTASSIUM CHLORIDE CRYS ER 20 MEQ PO TBCR: 40.0000 meq | EXTENDED_RELEASE_TABLET | Freq: Once | ORAL | Status: DC

## 2016-10-05 MED ORDER — METRONIDAZOLE 500 MG PO TABS: 500.0000 mg | ORAL_TABLET | Freq: Three times a day (TID) | ORAL | 0 refills | Status: AC

## 2016-10-05 MED ORDER — CEFPODOXIME PROXETIL 200 MG PO TABS
200.0000 mg | ORAL_TABLET | ORAL | Status: AC
Start: 2016-10-06 — End: 2016-10-13

## 2016-10-05 MED ORDER — CLONAZEPAM 0.5 MG PO TABS: 0.2500 mg | ORAL_TABLET | Freq: Two times a day (BID) | ORAL | 0 refills | Status: DC | PRN

## 2016-10-05 MED ORDER — COLLAGENASE 250 UNIT/GM EX OINT: TOPICAL_OINTMENT | Freq: Every day | CUTANEOUS | 0 refills | Status: AC

## 2016-10-05 NOTE — Discharge Instructions (Signed)
Wound Care: To sacral and left heel wounds. Cleanse with NS, pat dry, apply nickel thick layer of Santyl ointment, cover with NS moistened gauze, dry gauze topper, wrap with kerlix.

## 2016-10-05 NOTE — Care Management Important Message (Addendum)
Important Message  Patient Details IM Letter given to Cookie/Case Manager to present to Patient Name: Elizabeth Keller MRN: [id number] Date of Birth: 10/08/1923   Medicare Important Message Given:  Yes    Caren Macadam 10/05/2016, 11:50 AMImportant Message  Patient Details  Name: Elizabeth Keller MRN: [id number] Date of Birth: 12/18/1923   Medicare Important Message Given:  Yes    Caren Macadam 10/05/2016, 11:50 AM

## 2016-10-05 NOTE — Discharge Summary (Signed)
[facility]  Physician Discharge Summary   Patient ID: Elizabeth Keller MRN: [id number] DOB/AGE: Jan 10, 1924 83 y.o.  Admit date: 09/30/2016 Discharge date: 10/05/2016  PCP: Sandford Craze, NP  DISCHARGE DIAGNOSES:  Principal Problem:   Sepsis (HCC) Active Problems:   Chronic kidney disease, stage III (moderate)   Severe protein-calorie malnutrition (HCC)   Stage IV pressure ulcer of sacral region (HCC)   Cognitive impairment   Anemia, chronic disease   ARF (acute renal failure) (HCC)   RECOMMENDATIONS FOR OUTPATIENT FOLLOW UP: 1. CBC and basic metabolic panel in 3-4 days 2. Wound care instructions as below 3. Consider palliative medicine consultation at the skilled nursing facility 4. She will need Foley catheter care  DISCHARGE CONDITION: fair  Diet recommendation: Dysphagia 2 diet with thin liquids when she has dentures. Dysphagia 1 diet with thin liquids when she does not have dentures.  Filed Weights   10/03/16 1311 10/04/16 0500 10/05/16 0500  Weight: 62.4 kg (137 lb 9.1 oz) 59 kg (130 lb 1.1 oz) 57.5 kg (126 lb 12.2 oz)    INITIAL HISTORY: 83 year old African-American female with a past medical history of asthma, cognitive impairment, stage IV sacral decubitus, who lives in a skilled nursing facility and was been hospitalized 4 times this year. She was brought into the hospital, as apparently she was not her usual self and wasn't eating much over the past few days. She was found to have acute renal failure and there was concern for sepsis. She was hospitalized for further management   HOSPITAL COURSE:   Acute metabolic encephalopathy, Possibly due to renal failure. Sepsis could have also contributed. She did not have any obvious neurological deficits. Mental status has improved and now close to baseline.   Sepsis due to UTI/infected sacral decubitus/colitis Sepsis protocol was initiated. Lactic acid level was normal. Urine cultures positive for  Proteus. CT abdomen pelvis was done which suggested colitis. Severe constipation was also noted. She did have a large bowel movement and has been having regular bowel movements. She was initially placed on broad-spectrum antibiotics. Now will be discharged on Vantin and Flagyl for 7 more days. Sepsis physiology has resolved.  Acute renal failure on chronic kidney disease stage III/chronic indwelling Foley catheter/hematuria Creatinine was 0.6 in June. Acute renal failure most likely due to poor oral intake at the skilled nursing facility. She was given IV fluids initially. Renal function started improving and then plateaud. Urine output noted to be low. Her weight had increased. She was given Lasix with brisk response. See below under "chronic diastolic CHF". Hematuria has subsided which was likely due to trauma when Foley was inserted. No abnormalities in the GU tract noted on the CT scan. Foley care.  Sacral Decubitus, stage IV/Right heel pressure ulcer--stage 4-POA/ Left heel pressure ulcer/Unstageable Right trochanter-Deep tissue injury Sacral decubitus noted with yellowish exudate, possibly suggesting infection. Patient seen by wound care nurse. She underwent whirlpool treatment. She will need continued wound care at the skilled nursing facility. Consider having her seen by wound care nurse or referring her to the wound center. Wound Care: To sacral and left heel wounds. Cleanse with NS, pat dry, apply nickel thick layer of Santyl ointment, cover with NS moistened gauze, dry gauze topper, wrap with kerlix.  Chronic diastolic CHF Patient used to be on torsemide. Previous records reviewed. It looks like torsemide was discontinued on June 13 of this year. Patient given Lasix with good diuresis. She will be discharged on low dose furosemide. Potassium will be supplemented.  Hypokalemia. Replace potassium. Place her on scheduled potassium.  Anemia of chronic disease. Hemoglobin did drop but most  likely due to dilution. Remains stable. No evidence for overt bleeding except for mild hematuria which has resolved.  Severe protein calorie malnutrition. Patient on dysphagia diet as mentioned above. She will need encouragement for her to take orally. If her oral intake continues to decline, then it likely indicates poor prognosis. Patient and family will benefit from conversation with palliative medicine on an ongoing basis.  Advanced degenerative changes with possible spinal stenosis This was incidentally noted on the CT scan. Patient has not ambulated since February. She is not a candidate for aggressive interventions due to her poor quality of life at baseline. Discussed with her son. He understands.  Endometrial thickening Noted incidentally on CT scan. Once again, not a candidate for aggressive interventions due to poor quality of life at baseline. This can be pursued as an outpatient. Discussed with her son. According to him, patient would not want any further evaluation for this problem.  Goals of care. She is a limited code. Patient was seen by palliative medicine during her last hospitalization. This can be further pursued at the skilled nursing facility.  Overall, stable. Improved from the time of admission. Okay for discharge to skilled nursing facility. She'll be going to a new facility per family request.    PERTINENT LABS:  The results of significant diagnostics from this hospitalization (including imaging, microbiology, ancillary and laboratory) are listed below for reference.    Microbiology: Recent Results (from the past 240 hour(s))  Culture, blood (Routine x 2)     Status: None (Preliminary result)   Collection Time: 09/30/16 12:30 PM  Result Value Ref Range Status   Specimen Description BLOOD RIGHT ARM  Final   Special Requests   Final    BOTTLES DRAWN AEROBIC AND ANAEROBIC Blood Culture adequate volume   Culture   Final    NO GROWTH 4 DAYS Performed at [facility], [address], [place], [place] [address]    Report Status PENDING  Incomplete  Culture, blood (Routine x 2)     Status: None (Preliminary result)   Collection Time: 09/30/16 12:32 PM  Result Value Ref Range Status   Specimen Description BLOOD LEFT HAND  Final   Special Requests   Final    BOTTLES DRAWN AEROBIC AND ANAEROBIC Blood Culture adequate volume   Culture   Final    NO GROWTH 4 DAYS Performed at [facility], [address] [place], [place] [address]    Report Status PENDING  Incomplete  Culture, Urine     Status: Abnormal   Collection Time: 09/30/16  4:36 PM  Result Value Ref Range Status   Specimen Description URINE, CATHETERIZED  Final   Special Requests NONE  Final   Culture >=100,000 COLONIES/mL PROTEUS MIRABILIS (A)  Final   Report Status 10/02/2016 FINAL  Final   Organism ID, Bacteria PROTEUS MIRABILIS (A)  Final      Susceptibility   Proteus mirabilis - MIC*    AMPICILLIN <=2 SENSITIVE Sensitive     CEFAZOLIN <=4 SENSITIVE Sensitive     CEFTRIAXONE <=1 SENSITIVE Sensitive     CIPROFLOXACIN <=0.25 SENSITIVE Sensitive     GENTAMICIN <=1 SENSITIVE Sensitive     IMIPENEM 2 SENSITIVE Sensitive     NITROFURANTOIN 128 RESISTANT Resistant     TRIMETH/SULFA <=20 SENSITIVE Sensitive     AMPICILLIN/SULBACTAM <=2 SENSITIVE Sensitive  PIP/TAZO <=4 SENSITIVE Sensitive     * >=100,000 COLONIES/mL PROTEUS MIRABILIS  MRSA PCR Screening     Status: Abnormal   Collection Time: 09/30/16  6:10 PM  Result Value Ref Range Status   MRSA by PCR POSITIVE (A) NEGATIVE Final    Comment:        The GeneXpert MRSA Assay (FDA approved for NASAL specimens only), is one component of a comprehensive MRSA colonization surveillance program. It is not intended to diagnose MRSA infection nor to guide or monitor treatment for MRSA infections. RESULT CALLED TO, READ BACK BY AND VERIFIED WITH: PATRAM,L RN 8.23.18 @0006  ZANDO,C      Labs: Basic Metabolic  Panel:  Recent Labs Lab 10/01/16 0623 10/02/16 0526 10/03/16 0432 10/04/16 0539 10/05/16 0439  NA 146* 144 144 146* 145  K 4.3 3.6 3.3* 2.9* 3.3*  CL 112* 114* 115* 115* 114*  CO2 26 23 21* 25 24  GLUCOSE 178* 138* 169* 177* 161*  BUN 60* 53* 47* 39* 32*  CREATININE 2.59* 2.23* 1.99* 1.69* 1.53*  CALCIUM 7.5* 7.5* 7.7* 8.4* 8.5*   Liver Function Tests:  Recent Labs Lab 09/30/16 1234  AST 12*  ALT 7*  ALKPHOS 65  BILITOT 1.6*  PROT 6.1*  ALBUMIN 2.1*   CBC:  Recent Labs Lab 09/30/16 1234 10/01/16 0623 10/02/16 0526 10/03/16 0432 10/04/16 0539  WBC 14.1* 15.6* 15.7* 17.8* 11.3*  NEUTROABS 12.0*  --   --   --   --   HGB 9.5* 7.8* 7.9* 7.6* 7.9*  HCT 28.9* 23.7* 23.9* 22.5* 23.8*  MCV 80.7 81.2 79.9 79.8 79.3  PLT 446* 386 365 376 367     IMAGING STUDIES Ct Abdomen Pelvis Wo Contrast  Result Date: 10/01/2016 CLINICAL DATA:  83 y/o  F; abdominal pain. EXAM: CT ABDOMEN AND PELVIS WITHOUT CONTRAST TECHNIQUE: Multidetector CT imaging of the abdomen and pelvis was performed following the standard protocol without IV contrast. COMPARISON:  CT abdomen and pelvis 04/03/16. FINDINGS: Lower chest: Small bilateral pleural effusions. Coronary artery calcifications. Hepatobiliary: No focal liver abnormality is seen. No gallstones, gallbladder wall thickening, or biliary dilatation. Pancreas: Unremarkable. No pancreatic ductal dilatation or surrounding inflammatory changes. Spleen: Normal in size without focal abnormality. Adrenals/Urinary Tract: Adrenal glands are unremarkable. Kidneys are normal, without renal calculi, focal lesion, or hydronephrosis. Bladder is unremarkable. Stomach/Bowel: Stomach is within normal limits. No findings of bowel obstruction. Diffuse wall thickening of the colon greatest in cecum and ascending segments. Large volume of stool in the rectum. Vascular/Lymphatic: Aortic atherosclerosis. No enlarged abdominal or pelvic lymph nodes. Reproductive: Endometrial  thickening 11 mm. No adnexal lesion identified. Other: No abdominal wall hernia or abnormality. No abdominopelvic ascites. Musculoskeletal: No fracture is seen. Advanced lumber degenerative changes greatest at the L4-5 level where there is advanced spinal canal stenosis. IMPRESSION: 1. Diffuse colitis greatest in cecum and ascending segments. 2. Large volume of stool in the rectum may represent constipation. 3. Small bilateral pleural effusions. Coronary artery calcifications. Aortic atherosclerosis. 4. Endometrial thickening 11 mm, given patient's age pelvic ultrasound is recommended for further evaluation on a nonemergent basis. 5. Advanced lumber degenerative changes greatest at the L4-5 level where there is advanced spinal canal stenosis. Electronically Signed   By: Mitzi Hansen M.D.   On: 10/01/2016 17:12   Dg Chest Port 1 View  Result Date: 09/30/2016 CLINICAL DATA:  Altered mental status.  Unresponsive. EXAM: PORTABLE CHEST 1 VIEW COMPARISON:  06/29/2016 FINDINGS: Heart size is normal. Chronic aortic atherosclerosis. Chronic pleural  and parenchymal scarring in the upper chest with chronic volume loss. No sign of active infiltrate, collapse or effusion. No acute bone finding. IMPRESSION: Chronic lung disease with upper lobe retraction. No active process identified. Electronically Signed   By: Paulina Fusi M.D.   On: 09/30/2016 12:47   Dg Abd Portable 2v  Result Date: 09/30/2016 CLINICAL DATA:  Abdominal tenderness. EXAM: PORTABLE ABDOMEN - 2 VIEW COMPARISON:  Radiograph of Jun 29, 2016. FINDINGS: The bowel gas pattern is normal. There is no evidence of free air on left lateral decubitus film. Atherosclerosis of abdominal aorta is noted. Phleboliths are noted in the pelvis. IMPRESSION: No evidence of bowel obstruction or ileus. No pneumoperitoneum is noted. Aortic atherosclerosis. Electronically Signed   By: Lupita Raider, M.D.   On: 09/30/2016 17:41    DISCHARGE EXAMINATION: Vitals:     10/04/16 1418 10/04/16 2024 10/05/16 0500 10/05/16 0555  BP: 124/70 117/60  127/69  Pulse: 99 96  (!) 101  Resp: 16 18  16   Temp: 98.4 F (36.9 C) 98.3 F (36.8 C)  98.3 F (36.8 C)  TempSrc: Oral Oral  Axillary  SpO2: 99% 99%  100%  Weight:   57.5 kg (126 lb 12.2 oz)   Height:       General appearance: alert, cooperative, distracted and no distress Resp: clear to auscultation bilaterally Cardio: regular rate and rhythm, S1, S2 normal, no murmur, click, rub or gallop GI: soft, non-tender; bowel sounds normal; no masses,  no organomegaly  DISPOSITION: SNF  Discharge Instructions    Call MD for:  difficulty breathing, headache or visual disturbances    Complete by:  As directed    Call MD for:  extreme fatigue    Complete by:  As directed    Call MD for:  persistant dizziness or light-headedness    Complete by:  As directed    Call MD for:  persistant nausea and vomiting    Complete by:  As directed    Call MD for:  severe uncontrolled pain    Complete by:  As directed    Call MD for:  temperature >100.4    Complete by:  As directed    Discharge instructions    Complete by:  As directed    Please review discharge summary for instructions.  You were cared for by a hospitalist during your hospital stay. If you have any questions about your discharge medications or the care you received while you were in the hospital after you are discharged, you can call the unit and asked to speak with the hospitalist on call if the hospitalist that took care of you is not available. Once you are discharged, your primary care physician will handle any further medical issues. Please note that NO REFILLS for any discharge medications will be authorized once you are discharged, as it is imperative that you return to your primary care physician (or establish a relationship with a primary care physician if you do not have one) for your aftercare needs so that they can reassess your need for  medications and monitor your lab values. If you do not have a primary care physician, you can call [phone] for a physician referral.   Increase activity slowly    Complete by:  As directed       ALLERGIES:  Allergies  Allergen Reactions  . Dicyclomine Other (See Comments)    Reaction:  Dizziness   . Gabapentin Other (See Comments)    Reaction:  Dizziness   . Metformin And Related Nausea And Vomiting  . Lyrica [Pregabalin] Other (See Comments)    Reaction:  Unknown  . Topiramate Other (See Comments)    Reaction:  Unknown     Current Discharge Medication List    START taking these medications   Details  cefpodoxime (VANTIN) 200 MG tablet Take 1 tablet (200 mg total) by mouth daily. For 7 more days    collagenase (SANTYL) ointment Apply topically daily. Apply to sacrum and left heel wounds daily Qty: 15 g, Refills: 0    furosemide (LASIX) 20 MG tablet Take 1 tablet (20 mg total) by mouth daily. Qty: 30 tablet    metroNIDAZOLE (FLAGYL) 500 MG tablet Take 1 tablet (500 mg total) by mouth every 8 (eight) hours. For 7 more days Qty: 21 tablet, Refills: 0    polyethylene glycol (MIRALAX / GLYCOLAX) packet Take 17 g by mouth daily. Qty: 14 each, Refills: 0    potassium chloride SA (K-DUR,KLOR-CON) 20 MEQ tablet Take 1 tablet (20 mEq total) by mouth daily. Qty: 1 tablet, Refills: 0      CONTINUE these medications which have CHANGED   Details  clonazePAM (KLONOPIN) 0.5 MG tablet Take 0.5 tablets (0.25 mg total) by mouth 2 (two) times daily as needed for anxiety. Qty: 30 tablet, Refills: 0    traMADol (ULTRAM) 50 MG tablet Take 1 tablet (50 mg total) by mouth every 6 (six) hours as needed. Qty: 30 tablet, Refills: 0      CONTINUE these medications which have NOT CHANGED   Details  acetaminophen (TYLENOL) 500 MG tablet Take 1,000 mg by mouth every 8 (eight) hours as needed for moderate pain.     albuterol (PROVENTIL HFA;VENTOLIN HFA) 108 (90 Base) MCG/ACT inhaler Inhale 2  puffs into the lungs every 6 (six) hours as needed for wheezing or shortness of breath.     albuterol (PROVENTIL) (2.5 MG/3ML) 0.083% nebulizer solution Take 2.5 mg by nebulization every 6 (six) hours as needed for wheezing or shortness of breath.    allopurinol (ZYLOPRIM) 100 MG tablet Take 50 mg by mouth daily.    Amino Acids-Protein Hydrolys (FEEDING SUPPLEMENT, PRO-STAT SUGAR FREE 64,) LIQD Take 30 mLs by mouth 3 (three) times daily with meals.    aspirin 81 MG tablet Take 81 mg by mouth daily.    bisacodyl (DULCOLAX) 10 MG suppository Place 1 suppository (10 mg total) rectally daily as needed for moderate constipation. Qty: 12 suppository, Refills: 0    diclofenac sodium (VOLTAREN) 1 % GEL APPLY 2 GRAMS EXTERNALLY TO THE AFFECTED AREA FOUR TIMES DAILY AS NEEDED FOR PAIN Qty: 100 g, Refills: 0    Lidocaine 4 % PTCH Apply 1 application topically daily. Apply to lower back topically in AM one time a day for pain management.  Remove after 12 hours     Menthol, Topical Analgesic, (BIOFREEZE) 4 % GEL Apply 1 application topically 2 (two) times daily. Apply to lower back topically every shift for back pain    Multiple Vitamins-Minerals (DECUBI-VITE) CAPS Take 1 capsule by mouth daily.    Nutritional Supplements (NUTRITIONAL SUPPLEMENT PO) Take 1 Can by mouth 3 (three) times daily with meals. *House Supplement - Nutritional Treat*    omeprazole (PRILOSEC) 40 MG capsule Take 40 mg by mouth daily.       STOP taking these medications     Ostomy Supplies (SKIN PREP SPRAY) MISC           Contact information for  follow-up providers    Sandford Craze, NP. Schedule an appointment as soon as possible for a visit in 1 week(s).   Specialty:  Internal Medicine Contact information: [address] [place] [address] [phone]            Contact information for after-discharge care    Destination    [facility] Follow up.   Specialty:  Skilled  Nursing Facility Contact information: [address] [place] [address] [phone]                  TOTAL DISCHARGE TIME: 35 minutes  [facility]  [facility] Pager [phone]  10/05/2016, 11:05 AM

## 2016-10-05 NOTE — Clinical Social Work Placement (Signed)
Patient received and accepted bed offer at [facility]. [facility] contacted, patient's family notified. Patient's RN can call report to [phone] room 104P, packet complete.  CLINICAL SOCIAL WORK PLACEMENT  NOTE  Date:  10/05/2016  Patient Details  Name: Elizabeth Keller MRN: [id number] Date of Birth: 1924/01/26  Clinical Social Work is seeking post-discharge placement for this patient at the Skilled  Nursing Facility level of care (*CSW will initial, date and re-position this form in  chart as items are completed):      Patient/family provided with [facility] Clinical Social Work Department's list of facilities offering this level of care within the geographic area requested by the patient (or if unable, by the patient's family).  Yes   Patient/family informed of their freedom to choose among providers that offer the needed level of care, that participate in Medicare, Medicaid or managed care program needed by the patient, have an available bed and are willing to accept the patient.  Yes   Patient/family informed of [facility]'s ownership interest in [facility] and [facility], as well as of the fact that they are under no obligation to receive care at these facilities.  PASRR submitted to EDS on       PASRR number received on 10/01/16     Existing PASRR number confirmed on       FL2 transmitted to all facilities in geographic area requested by pt/family on 10/01/16     FL2 transmitted to all facilities within larger geographic area on       Patient informed that his/her managed care company has contracts with or will negotiate with certain facilities, including the following:        Yes   Patient/family informed of bed offers received.  Patient chooses bed at [facility]     Physician recommends and patient chooses bed at      Patient to be transferred to [facility] on 10/05/16.  Patient to be transferred to facility by [facility]     Patient family notified on  10/05/16 of transfer.  Name of family member notified:  Rudar Ricks     PHYSICIAN       Additional Comment:    _______________________________________________ Antionette Poles, LCSW 10/05/2016, 2:12 PM -

## 2016-10-05 NOTE — Progress Notes (Signed)
CSW spoke with patient's HCPOA/children (Rudar Ricks and [name]). Patient's son reported that he and his sister have HCPOA and requested that patient's medical care/medical decisions not be discussed with their other sister (Mrs. Katrinka Blazing) who does not have HCPOA. Per Dr. Rito Ehrlich, patient ready to discharge to SNF today. CSW updated patient's family and SNF. Patient's son reported that patient will need PTAR at discharge. CSW will continue to follow and assist with discharge planning.   Celso Sickle, LCSWA [facility]  Clinical Social Worker [phone]

## 2016-10-05 NOTE — Progress Notes (Signed)
PT - HYDROTHERAPY TREATMENT NOTE  10/05/16 1300  Subjective Assessment  Patient and Family Stated Goals wound healed- per dtr  Date of Onset (present on adm)  Prior Treatments dressing changes  Evaluation and Treatment  Evaluation and Treatment Procedures Explained to Patient/Family Yes  Evaluation and Treatment Procedures Patient unable to consent due to mental status  Pressure Injury 10/02/16 Stage IV - Full thickness tissue loss with exposed bone, tendon or muscle. *PT* sacral pressure injury  Date First Assessed/Time First Assessed: 10/02/16 1240   Location: Sacrum  Location Orientation: Mid  Staging: Stage IV - Full thickness tissue loss with exposed bone, tendon or muscle.  Wound Description (Comments): *PT* sacral pressure injury  Prese...  Dressing Type Moist to dry (santyl)  Dressing Clean;Dry;Intact  State of Healing Early/partial granulation  Site / Wound Assessment Brown;Granulation tissue;Pink;Yellow  % Wound base Red or Granulating 30%  % Wound base Yellow/Fibrinous Exudate 65%  % Wound base Black/Eschar 5%  % Wound base Other/Granulation Tissue (Comment) (small area of bone exposed)  Margins Unattached edges (unapproximated)  Drainage Amount Minimal  Drainage Description Serosanguineous  Treatment Debridement (Selective);Hydrotherapy (Pulse lavage);Packing (Saline gauze) (santyl)  Hydrotherapy  Pulsed lavage therapy - wound location sacrum  Pulsed Lavage with Suction (psi) 8 psi  Pulsed Lavage with Suction - Normal Saline Used 1000 mL  Pulsed Lavage Tip Tip with splash shield  Selective Debridement  Selective Debridement - Location sacrum  Selective Debridement - Tools Used Forceps;Scissors  Selective Debridement - Tissue Removed eschar and slough  Wound Therapy - Assess/Plan/Recommendations  Wound Therapy - Clinical Statement pt will benefit from PT for hydrotherapy to facilitate wound healing  Wound Therapy - Functional Problem List pt is bedbound  Factors  Delaying/Impairing Wound Healing Multiple medical problems  Hydrotherapy Plan Debridement;Dressing change;Patient/family education;Pulsatile lavage with suction  Wound Therapy - Frequency 6X / week  Wound Therapy - Follow Up Recommendations Skilled nursing facility  Wound Plan pulsed lavage 6x/wk to sacral pressure wound  Wound Therapy Goals - Improve the function of patient's integumentary system by progressing the wound(s) through the phases of wound healing by:  Decrease Necrotic Tissue to 75  Decrease Necrotic Tissue - Progress Met  Increase Granulation Tissue to 25  Increase Granulation Tissue - Progress Met  Goals/treatment plan/discharge plan were made with and agreed upon by patient/family No, Patient unable to participate in goals/treatment/discharge plan and family unavailable  Time For Goal Achievement 2 weeks  Wound Therapy - Potential for Goals Leanord Hawking, PT Pager: [phone] 10/05/2016

## 2016-10-06 DIAGNOSIS — G894 Chronic pain syndrome: Secondary | ICD-10-CM | POA: Insufficient documentation

## 2016-10-06 DIAGNOSIS — M4800 Spinal stenosis, site unspecified: Secondary | ICD-10-CM | POA: Insufficient documentation

## 2016-10-12 DIAGNOSIS — L89154 Pressure ulcer of sacral region, stage 4: Secondary | ICD-10-CM | POA: Diagnosis not present

## 2016-10-12 DIAGNOSIS — L89610 Pressure ulcer of right heel, unstageable: Secondary | ICD-10-CM | POA: Diagnosis not present

## 2016-10-12 DIAGNOSIS — L89620 Pressure ulcer of left heel, unstageable: Secondary | ICD-10-CM | POA: Diagnosis not present

## 2016-10-15 DIAGNOSIS — I509 Heart failure, unspecified: Secondary | ICD-10-CM | POA: Insufficient documentation

## 2016-10-16 DIAGNOSIS — I5032 Chronic diastolic (congestive) heart failure: Secondary | ICD-10-CM | POA: Diagnosis not present

## 2016-10-16 DIAGNOSIS — R1312 Dysphagia, oropharyngeal phase: Secondary | ICD-10-CM | POA: Diagnosis not present

## 2016-10-16 DIAGNOSIS — R2689 Other abnormalities of gait and mobility: Secondary | ICD-10-CM | POA: Diagnosis not present

## 2016-10-16 DIAGNOSIS — R29810 Facial weakness: Secondary | ICD-10-CM | POA: Diagnosis not present

## 2016-10-16 DIAGNOSIS — R41841 Cognitive communication deficit: Secondary | ICD-10-CM | POA: Diagnosis not present

## 2016-10-16 DIAGNOSIS — F419 Anxiety disorder, unspecified: Secondary | ICD-10-CM | POA: Diagnosis not present

## 2016-10-17 DIAGNOSIS — R41841 Cognitive communication deficit: Secondary | ICD-10-CM | POA: Diagnosis not present

## 2016-10-17 DIAGNOSIS — R1312 Dysphagia, oropharyngeal phase: Secondary | ICD-10-CM | POA: Diagnosis not present

## 2016-10-17 DIAGNOSIS — R29810 Facial weakness: Secondary | ICD-10-CM | POA: Diagnosis not present

## 2016-10-17 DIAGNOSIS — R2689 Other abnormalities of gait and mobility: Secondary | ICD-10-CM | POA: Diagnosis not present

## 2016-10-19 DIAGNOSIS — L89610 Pressure ulcer of right heel, unstageable: Secondary | ICD-10-CM | POA: Diagnosis not present

## 2016-10-19 DIAGNOSIS — R41841 Cognitive communication deficit: Secondary | ICD-10-CM | POA: Diagnosis not present

## 2016-10-19 DIAGNOSIS — R29810 Facial weakness: Secondary | ICD-10-CM | POA: Diagnosis not present

## 2016-10-19 DIAGNOSIS — L89620 Pressure ulcer of left heel, unstageable: Secondary | ICD-10-CM | POA: Diagnosis not present

## 2016-10-19 DIAGNOSIS — R1312 Dysphagia, oropharyngeal phase: Secondary | ICD-10-CM | POA: Diagnosis not present

## 2016-10-19 DIAGNOSIS — L89154 Pressure ulcer of sacral region, stage 4: Secondary | ICD-10-CM | POA: Diagnosis not present

## 2016-10-19 DIAGNOSIS — R2689 Other abnormalities of gait and mobility: Secondary | ICD-10-CM | POA: Diagnosis not present

## 2016-10-20 DIAGNOSIS — R29810 Facial weakness: Secondary | ICD-10-CM | POA: Diagnosis not present

## 2016-10-20 DIAGNOSIS — R2689 Other abnormalities of gait and mobility: Secondary | ICD-10-CM | POA: Diagnosis not present

## 2016-10-20 DIAGNOSIS — R1312 Dysphagia, oropharyngeal phase: Secondary | ICD-10-CM | POA: Diagnosis not present

## 2016-10-20 DIAGNOSIS — R41841 Cognitive communication deficit: Secondary | ICD-10-CM | POA: Diagnosis not present

## 2016-10-21 ENCOUNTER — Other Ambulatory Visit (HOSPITAL_COMMUNITY): Payer: Self-pay | Admitting: Family Medicine

## 2016-10-21 DIAGNOSIS — R41841 Cognitive communication deficit: Secondary | ICD-10-CM | POA: Diagnosis not present

## 2016-10-21 DIAGNOSIS — R2689 Other abnormalities of gait and mobility: Secondary | ICD-10-CM | POA: Diagnosis not present

## 2016-10-21 DIAGNOSIS — R630 Anorexia: Secondary | ICD-10-CM | POA: Diagnosis not present

## 2016-10-21 DIAGNOSIS — R1310 Dysphagia, unspecified: Secondary | ICD-10-CM

## 2016-10-21 DIAGNOSIS — R1312 Dysphagia, oropharyngeal phase: Secondary | ICD-10-CM | POA: Diagnosis not present

## 2016-10-21 DIAGNOSIS — R29810 Facial weakness: Secondary | ICD-10-CM | POA: Diagnosis not present

## 2016-10-22 DIAGNOSIS — R1312 Dysphagia, oropharyngeal phase: Secondary | ICD-10-CM | POA: Diagnosis not present

## 2016-10-22 DIAGNOSIS — N390 Urinary tract infection, site not specified: Secondary | ICD-10-CM | POA: Diagnosis not present

## 2016-10-22 DIAGNOSIS — R41841 Cognitive communication deficit: Secondary | ICD-10-CM | POA: Diagnosis not present

## 2016-10-22 DIAGNOSIS — R29810 Facial weakness: Secondary | ICD-10-CM | POA: Diagnosis not present

## 2016-10-22 DIAGNOSIS — R2689 Other abnormalities of gait and mobility: Secondary | ICD-10-CM | POA: Diagnosis not present

## 2016-10-23 DIAGNOSIS — R29810 Facial weakness: Secondary | ICD-10-CM | POA: Diagnosis not present

## 2016-10-23 DIAGNOSIS — R1312 Dysphagia, oropharyngeal phase: Secondary | ICD-10-CM | POA: Diagnosis not present

## 2016-10-23 DIAGNOSIS — R41841 Cognitive communication deficit: Secondary | ICD-10-CM | POA: Diagnosis not present

## 2016-10-23 DIAGNOSIS — R2689 Other abnormalities of gait and mobility: Secondary | ICD-10-CM | POA: Diagnosis not present

## 2016-10-25 DIAGNOSIS — R1312 Dysphagia, oropharyngeal phase: Secondary | ICD-10-CM | POA: Diagnosis not present

## 2016-10-25 DIAGNOSIS — R29810 Facial weakness: Secondary | ICD-10-CM | POA: Diagnosis not present

## 2016-10-25 DIAGNOSIS — R41841 Cognitive communication deficit: Secondary | ICD-10-CM | POA: Diagnosis not present

## 2016-10-25 DIAGNOSIS — R2689 Other abnormalities of gait and mobility: Secondary | ICD-10-CM | POA: Diagnosis not present

## 2016-10-26 ENCOUNTER — Inpatient Hospital Stay (HOSPITAL_COMMUNITY): Admission: RE | Admit: 2016-10-26 | Payer: Medicare Other | Source: Ambulatory Visit

## 2016-10-26 ENCOUNTER — Encounter (HOSPITAL_COMMUNITY): Payer: Medicare Other

## 2016-10-26 DIAGNOSIS — R1312 Dysphagia, oropharyngeal phase: Secondary | ICD-10-CM | POA: Diagnosis not present

## 2016-10-26 DIAGNOSIS — L89610 Pressure ulcer of right heel, unstageable: Secondary | ICD-10-CM | POA: Diagnosis not present

## 2016-10-26 DIAGNOSIS — R41841 Cognitive communication deficit: Secondary | ICD-10-CM | POA: Diagnosis not present

## 2016-10-26 DIAGNOSIS — L89620 Pressure ulcer of left heel, unstageable: Secondary | ICD-10-CM | POA: Diagnosis not present

## 2016-10-26 DIAGNOSIS — R2689 Other abnormalities of gait and mobility: Secondary | ICD-10-CM | POA: Diagnosis not present

## 2016-10-26 DIAGNOSIS — L89154 Pressure ulcer of sacral region, stage 4: Secondary | ICD-10-CM | POA: Diagnosis not present

## 2016-10-26 DIAGNOSIS — R29810 Facial weakness: Secondary | ICD-10-CM | POA: Diagnosis not present

## 2016-10-27 DIAGNOSIS — R29810 Facial weakness: Secondary | ICD-10-CM | POA: Diagnosis not present

## 2016-10-27 DIAGNOSIS — R2689 Other abnormalities of gait and mobility: Secondary | ICD-10-CM | POA: Diagnosis not present

## 2016-10-27 DIAGNOSIS — R41841 Cognitive communication deficit: Secondary | ICD-10-CM | POA: Diagnosis not present

## 2016-10-27 DIAGNOSIS — R1312 Dysphagia, oropharyngeal phase: Secondary | ICD-10-CM | POA: Diagnosis not present

## 2016-10-28 DIAGNOSIS — R29810 Facial weakness: Secondary | ICD-10-CM | POA: Diagnosis not present

## 2016-10-28 DIAGNOSIS — R1312 Dysphagia, oropharyngeal phase: Secondary | ICD-10-CM | POA: Diagnosis not present

## 2016-10-28 DIAGNOSIS — R41841 Cognitive communication deficit: Secondary | ICD-10-CM | POA: Diagnosis not present

## 2016-10-28 DIAGNOSIS — R2689 Other abnormalities of gait and mobility: Secondary | ICD-10-CM | POA: Diagnosis not present

## 2016-10-29 DIAGNOSIS — R29810 Facial weakness: Secondary | ICD-10-CM | POA: Diagnosis not present

## 2016-10-29 DIAGNOSIS — R41841 Cognitive communication deficit: Secondary | ICD-10-CM | POA: Diagnosis not present

## 2016-10-29 DIAGNOSIS — R1312 Dysphagia, oropharyngeal phase: Secondary | ICD-10-CM | POA: Diagnosis not present

## 2016-10-29 DIAGNOSIS — R2689 Other abnormalities of gait and mobility: Secondary | ICD-10-CM | POA: Diagnosis not present

## 2016-10-30 DIAGNOSIS — R1312 Dysphagia, oropharyngeal phase: Secondary | ICD-10-CM | POA: Diagnosis not present

## 2016-10-30 DIAGNOSIS — R29810 Facial weakness: Secondary | ICD-10-CM | POA: Diagnosis not present

## 2016-10-30 DIAGNOSIS — R41841 Cognitive communication deficit: Secondary | ICD-10-CM | POA: Diagnosis not present

## 2016-10-30 DIAGNOSIS — R2689 Other abnormalities of gait and mobility: Secondary | ICD-10-CM | POA: Diagnosis not present

## 2016-11-02 ENCOUNTER — Other Ambulatory Visit (HOSPITAL_COMMUNITY): Payer: Self-pay | Admitting: Family Medicine

## 2016-11-02 DIAGNOSIS — R41841 Cognitive communication deficit: Secondary | ICD-10-CM | POA: Diagnosis not present

## 2016-11-02 DIAGNOSIS — R1310 Dysphagia, unspecified: Secondary | ICD-10-CM

## 2016-11-02 DIAGNOSIS — R1312 Dysphagia, oropharyngeal phase: Secondary | ICD-10-CM | POA: Diagnosis not present

## 2016-11-02 DIAGNOSIS — R2689 Other abnormalities of gait and mobility: Secondary | ICD-10-CM | POA: Diagnosis not present

## 2016-11-02 DIAGNOSIS — R29810 Facial weakness: Secondary | ICD-10-CM | POA: Diagnosis not present

## 2016-11-02 DIAGNOSIS — L89610 Pressure ulcer of right heel, unstageable: Secondary | ICD-10-CM | POA: Diagnosis not present

## 2016-11-02 DIAGNOSIS — L89620 Pressure ulcer of left heel, unstageable: Secondary | ICD-10-CM | POA: Diagnosis not present

## 2016-11-02 DIAGNOSIS — L89154 Pressure ulcer of sacral region, stage 4: Secondary | ICD-10-CM | POA: Diagnosis not present

## 2016-11-03 DIAGNOSIS — R1312 Dysphagia, oropharyngeal phase: Secondary | ICD-10-CM | POA: Diagnosis not present

## 2016-11-03 DIAGNOSIS — R41841 Cognitive communication deficit: Secondary | ICD-10-CM | POA: Diagnosis not present

## 2016-11-03 DIAGNOSIS — R2689 Other abnormalities of gait and mobility: Secondary | ICD-10-CM | POA: Diagnosis not present

## 2016-11-03 DIAGNOSIS — R29810 Facial weakness: Secondary | ICD-10-CM | POA: Diagnosis not present

## 2016-11-04 ENCOUNTER — Ambulatory Visit (HOSPITAL_COMMUNITY)
Admission: RE | Admit: 2016-11-04 | Discharge: 2016-11-04 | Disposition: A | Discharge status: Home / Self Care | Payer: Medicare Other | Source: Ambulatory Visit | Attending: Family Medicine | Admitting: Family Medicine

## 2016-11-04 DIAGNOSIS — J45909 Unspecified asthma, uncomplicated: Secondary | ICD-10-CM | POA: Diagnosis not present

## 2016-11-04 DIAGNOSIS — F419 Anxiety disorder, unspecified: Secondary | ICD-10-CM | POA: Insufficient documentation

## 2016-11-04 DIAGNOSIS — E1122 Type 2 diabetes mellitus with diabetic chronic kidney disease: Secondary | ICD-10-CM | POA: Insufficient documentation

## 2016-11-04 DIAGNOSIS — R1310 Dysphagia, unspecified: Secondary | ICD-10-CM | POA: Insufficient documentation

## 2016-11-04 DIAGNOSIS — K589 Irritable bowel syndrome without diarrhea: Secondary | ICD-10-CM | POA: Diagnosis not present

## 2016-11-04 DIAGNOSIS — R627 Adult failure to thrive: Secondary | ICD-10-CM | POA: Insufficient documentation

## 2016-11-04 DIAGNOSIS — I130 Hypertensive heart and chronic kidney disease with heart failure and stage 1 through stage 4 chronic kidney disease, or unspecified chronic kidney disease: Secondary | ICD-10-CM | POA: Diagnosis not present

## 2016-11-04 DIAGNOSIS — M4800 Spinal stenosis, site unspecified: Secondary | ICD-10-CM | POA: Insufficient documentation

## 2016-11-04 DIAGNOSIS — E7800 Pure hypercholesterolemia, unspecified: Secondary | ICD-10-CM | POA: Insufficient documentation

## 2016-11-04 DIAGNOSIS — R1313 Dysphagia, pharyngeal phase: Secondary | ICD-10-CM | POA: Diagnosis present

## 2016-11-04 DIAGNOSIS — K219 Gastro-esophageal reflux disease without esophagitis: Secondary | ICD-10-CM | POA: Insufficient documentation

## 2016-11-04 DIAGNOSIS — K222 Esophageal obstruction: Secondary | ICD-10-CM | POA: Diagnosis not present

## 2016-11-04 DIAGNOSIS — R2689 Other abnormalities of gait and mobility: Secondary | ICD-10-CM | POA: Diagnosis not present

## 2016-11-04 DIAGNOSIS — R41841 Cognitive communication deficit: Secondary | ICD-10-CM | POA: Diagnosis not present

## 2016-11-04 DIAGNOSIS — N189 Chronic kidney disease, unspecified: Secondary | ICD-10-CM | POA: Insufficient documentation

## 2016-11-04 DIAGNOSIS — R29810 Facial weakness: Secondary | ICD-10-CM | POA: Diagnosis not present

## 2016-11-04 DIAGNOSIS — R1312 Dysphagia, oropharyngeal phase: Secondary | ICD-10-CM | POA: Insufficient documentation

## 2016-11-04 DIAGNOSIS — D869 Sarcoidosis, unspecified: Secondary | ICD-10-CM | POA: Diagnosis not present

## 2016-11-04 DIAGNOSIS — R4182 Altered mental status, unspecified: Secondary | ICD-10-CM | POA: Diagnosis not present

## 2016-11-04 NOTE — Progress Notes (Signed)
Modified Barium Swallow Progress Note  Patient Details  Name: Jarita Raval MRN: [id number] Date of Birth: 11/26/1923  Today's Date: 11/04/2016  Modified Barium Swallow completed.  Full report located under Chart Review in the Imaging Section.  Brief recommendations include the following:  Clinical Impression  Pt has a moderate oral and mild pharyngeal dysphagia that is likely cognitive in nature. It is marked by limited oral acceptance with solids, requiring encouragement from daughter to take small bites of both pureed and soft solids. Oral holding is prolonged and oral transit requires Max cueing but is not facilitated by liquid washes. There is some lingual residue that remains in her oral cavity that ultimately is cleared by a second swallow. Pharyngeally she does relatively well, with no significant residue remaining post-swallow. She has impaired timing with thin liquids, which are silently aspirated before the swallow, and cannot be cleared as pt does not follow commands to cough. If the pt/family goals are to maximize safety and reduce aspiration, I would recommend Dys 1 diet and nectar thick liquids; however, it also seems like their priorities may be to allow the patient to have the food/drink options that she prefers. They may wish to liberalize to Dys 2 diet, which would be effortful and laborious with limited intake, but per the daughter's report she already has minimal intake. Will defer diet recommendation to primary SLP, who can see pt across the context of a meal.   Swallow Evaluation Recommendations       SLP Diet Recommendations: Dysphagia 1 (Puree) solids;Nectar thick liquid;Other (Comment) (up to Dys 2, thin liquids, pending family wishes/GOC)   Liquid Administration via: Cup;Straw   Medication Administration: Crushed with puree   Supervision: Full assist for feeding;Full supervision/cueing for compensatory strategies   Compensations: Minimize environmental  distractions;Slow rate;Small sips/bites   Postural Changes: Remain semi-upright after after feeds/meals (Comment);Seated upright at 90 degrees   Oral Care Recommendations: Oral care BID        Maxcine Ham 11/04/2016,2:45 PM   Maxcine Ham, M.A. CCC-SLP [phone]

## 2016-11-05 DIAGNOSIS — R2689 Other abnormalities of gait and mobility: Secondary | ICD-10-CM | POA: Diagnosis not present

## 2016-11-05 DIAGNOSIS — R29810 Facial weakness: Secondary | ICD-10-CM | POA: Diagnosis not present

## 2016-11-05 DIAGNOSIS — R1312 Dysphagia, oropharyngeal phase: Secondary | ICD-10-CM | POA: Diagnosis not present

## 2016-11-05 DIAGNOSIS — R41841 Cognitive communication deficit: Secondary | ICD-10-CM | POA: Diagnosis not present

## 2016-11-06 DIAGNOSIS — R41841 Cognitive communication deficit: Secondary | ICD-10-CM | POA: Diagnosis not present

## 2016-11-06 DIAGNOSIS — R2689 Other abnormalities of gait and mobility: Secondary | ICD-10-CM | POA: Diagnosis not present

## 2016-11-06 DIAGNOSIS — R29810 Facial weakness: Secondary | ICD-10-CM | POA: Diagnosis not present

## 2016-11-06 DIAGNOSIS — R1312 Dysphagia, oropharyngeal phase: Secondary | ICD-10-CM | POA: Diagnosis not present

## 2016-11-08 ENCOUNTER — Encounter (HOSPITAL_COMMUNITY): Payer: Self-pay | Admitting: Nurse Practitioner

## 2016-11-08 ENCOUNTER — Emergency Department (HOSPITAL_COMMUNITY): Payer: Medicare Other

## 2016-11-08 ENCOUNTER — Inpatient Hospital Stay (HOSPITAL_COMMUNITY)
Admission: EM | Admit: 2016-11-08 | Discharge: 2016-11-12 | DRG: 698 | Disposition: A | Discharge status: Skilled Nursing Facility | Payer: Medicare Other | Attending: Internal Medicine | Admitting: Internal Medicine

## 2016-11-08 DIAGNOSIS — D638 Anemia in other chronic diseases classified elsewhere: Secondary | ICD-10-CM | POA: Diagnosis not present

## 2016-11-08 DIAGNOSIS — Z681 Body mass index (BMI) 19 or less, adult: Secondary | ICD-10-CM

## 2016-11-08 DIAGNOSIS — F419 Anxiety disorder, unspecified: Secondary | ICD-10-CM | POA: Diagnosis present

## 2016-11-08 DIAGNOSIS — E7800 Pure hypercholesterolemia, unspecified: Secondary | ICD-10-CM | POA: Diagnosis present

## 2016-11-08 DIAGNOSIS — G9341 Metabolic encephalopathy: Secondary | ICD-10-CM

## 2016-11-08 DIAGNOSIS — Z7982 Long term (current) use of aspirin: Secondary | ICD-10-CM

## 2016-11-08 DIAGNOSIS — R1310 Dysphagia, unspecified: Secondary | ICD-10-CM | POA: Diagnosis present

## 2016-11-08 DIAGNOSIS — N3000 Acute cystitis without hematuria: Secondary | ICD-10-CM | POA: Diagnosis not present

## 2016-11-08 DIAGNOSIS — D869 Sarcoidosis, unspecified: Secondary | ICD-10-CM | POA: Diagnosis present

## 2016-11-08 DIAGNOSIS — J45909 Unspecified asthma, uncomplicated: Secondary | ICD-10-CM | POA: Diagnosis present

## 2016-11-08 DIAGNOSIS — E43 Unspecified severe protein-calorie malnutrition: Secondary | ICD-10-CM | POA: Diagnosis present

## 2016-11-08 DIAGNOSIS — A419 Sepsis, unspecified organism: Secondary | ICD-10-CM | POA: Diagnosis present

## 2016-11-08 DIAGNOSIS — L89154 Pressure ulcer of sacral region, stage 4: Secondary | ICD-10-CM | POA: Diagnosis not present

## 2016-11-08 DIAGNOSIS — I5032 Chronic diastolic (congestive) heart failure: Secondary | ICD-10-CM | POA: Diagnosis present

## 2016-11-08 DIAGNOSIS — R6520 Severe sepsis without septic shock: Secondary | ICD-10-CM | POA: Diagnosis not present

## 2016-11-08 DIAGNOSIS — N390 Urinary tract infection, site not specified: Secondary | ICD-10-CM | POA: Diagnosis not present

## 2016-11-08 DIAGNOSIS — Z79899 Other long term (current) drug therapy: Secondary | ICD-10-CM

## 2016-11-08 DIAGNOSIS — Y846 Urinary catheterization as the cause of abnormal reaction of the patient, or of later complication, without mention of misadventure at the time of the procedure: Secondary | ICD-10-CM | POA: Diagnosis present

## 2016-11-08 DIAGNOSIS — N183 Chronic kidney disease, stage 3 unspecified: Secondary | ICD-10-CM | POA: Diagnosis present

## 2016-11-08 DIAGNOSIS — Z87891 Personal history of nicotine dependence: Secondary | ICD-10-CM

## 2016-11-08 DIAGNOSIS — X58XXXA Exposure to other specified factors, initial encounter: Secondary | ICD-10-CM | POA: Diagnosis present

## 2016-11-08 DIAGNOSIS — K589 Irritable bowel syndrome without diarrhea: Secondary | ICD-10-CM | POA: Diagnosis present

## 2016-11-08 DIAGNOSIS — K219 Gastro-esophageal reflux disease without esophagitis: Secondary | ICD-10-CM | POA: Diagnosis not present

## 2016-11-08 DIAGNOSIS — L89624 Pressure ulcer of left heel, stage 4: Secondary | ICD-10-CM | POA: Diagnosis present

## 2016-11-08 DIAGNOSIS — B999 Unspecified infectious disease: Secondary | ICD-10-CM | POA: Diagnosis not present

## 2016-11-08 DIAGNOSIS — I130 Hypertensive heart and chronic kidney disease with heart failure and stage 1 through stage 4 chronic kidney disease, or unspecified chronic kidney disease: Secondary | ICD-10-CM | POA: Diagnosis present

## 2016-11-08 DIAGNOSIS — R4182 Altered mental status, unspecified: Secondary | ICD-10-CM | POA: Diagnosis not present

## 2016-11-08 DIAGNOSIS — L89614 Pressure ulcer of right heel, stage 4: Secondary | ICD-10-CM | POA: Diagnosis present

## 2016-11-08 DIAGNOSIS — T83511A Infection and inflammatory reaction due to indwelling urethral catheter, initial encounter: Principal | ICD-10-CM | POA: Diagnosis present

## 2016-11-08 DIAGNOSIS — B3749 Other urogenital candidiasis: Secondary | ICD-10-CM | POA: Diagnosis present

## 2016-11-08 DIAGNOSIS — I4891 Unspecified atrial fibrillation: Secondary | ICD-10-CM | POA: Diagnosis present

## 2016-11-08 DIAGNOSIS — I509 Heart failure, unspecified: Secondary | ICD-10-CM | POA: Diagnosis not present

## 2016-11-08 DIAGNOSIS — G92 Toxic encephalopathy: Secondary | ICD-10-CM | POA: Diagnosis present

## 2016-11-08 DIAGNOSIS — D649 Anemia, unspecified: Secondary | ICD-10-CM

## 2016-11-08 DIAGNOSIS — E1129 Type 2 diabetes mellitus with other diabetic kidney complication: Secondary | ICD-10-CM | POA: Diagnosis present

## 2016-11-08 DIAGNOSIS — Z888 Allergy status to other drugs, medicaments and biological substances status: Secondary | ICD-10-CM

## 2016-11-08 DIAGNOSIS — E870 Hyperosmolality and hypernatremia: Secondary | ICD-10-CM | POA: Diagnosis not present

## 2016-11-08 DIAGNOSIS — E1122 Type 2 diabetes mellitus with diabetic chronic kidney disease: Secondary | ICD-10-CM | POA: Diagnosis present

## 2016-11-08 DIAGNOSIS — E860 Dehydration: Secondary | ICD-10-CM | POA: Diagnosis not present

## 2016-11-08 DIAGNOSIS — R402441 Other coma, without documented Glasgow coma scale score, or with partial score reported, in the field [EMT or ambulance]: Secondary | ICD-10-CM | POA: Diagnosis not present

## 2016-11-08 DIAGNOSIS — Z66 Do not resuscitate: Secondary | ICD-10-CM | POA: Diagnosis present

## 2016-11-08 DIAGNOSIS — R4020 Unspecified coma: Secondary | ICD-10-CM | POA: Diagnosis not present

## 2016-11-08 LAB — CBC WITH DIFFERENTIAL/PLATELET
Basophils Absolute: 0 10*3/uL (ref 0.0–0.1)
Basophils Relative: 0 %
EOS ABS: 0.2 10*3/uL (ref 0.0–0.7)
EOS PCT: 2 %
HCT: 9.2 % — ABNORMAL LOW (ref 36.0–46.0)
Hemoglobin: 3 g/dL — CL (ref 12.0–15.0)
LYMPHS ABS: 1.4 10*3/uL (ref 0.7–4.0)
Lymphocytes Relative: 10 %
MCH: 27 pg (ref 26.0–34.0)
MCHC: 32.6 g/dL (ref 30.0–36.0)
MCV: 82.9 fL (ref 78.0–100.0)
MONO ABS: 1.3 10*3/uL — AB (ref 0.1–1.0)
MONOS PCT: 9 %
Neutro Abs: 11.8 10*3/uL — ABNORMAL HIGH (ref 1.7–7.7)
Neutrophils Relative %: 80 %
PLATELETS: 593 10*3/uL — AB (ref 150–400)
RBC: 1.11 MIL/uL — ABNORMAL LOW (ref 3.87–5.11)
RDW: 23.8 % — AB (ref 11.5–15.5)
WBC: 14.9 10*3/uL — ABNORMAL HIGH (ref 4.0–10.5)

## 2016-11-08 LAB — CBC
HEMATOCRIT: 16.1 % — AB (ref 36.0–46.0)
Hemoglobin: 5.2 g/dL — CL (ref 12.0–15.0)
MCH: 26.7 pg (ref 26.0–34.0)
MCHC: 32.3 g/dL (ref 30.0–36.0)
MCV: 82.6 fL (ref 78.0–100.0)
PLATELETS: 264 10*3/uL (ref 150–400)
RBC: 1.95 MIL/uL — ABNORMAL LOW (ref 3.87–5.11)
RDW: 23.9 % — AB (ref 11.5–15.5)
WBC: 6.8 10*3/uL (ref 4.0–10.5)

## 2016-11-08 LAB — COMPREHENSIVE METABOLIC PANEL
ALK PHOS: 69 U/L (ref 38–126)
ALT: 8 U/L — AB (ref 14–54)
AST: 17 U/L (ref 15–41)
Albumin: 2.2 g/dL — ABNORMAL LOW (ref 3.5–5.0)
Anion gap: 7 (ref 5–15)
BUN: 32 mg/dL — AB (ref 6–20)
CALCIUM: 8.9 mg/dL (ref 8.9–10.3)
CHLORIDE: 113 mmol/L — AB (ref 101–111)
CO2: 29 mmol/L (ref 22–32)
CREATININE: 1.09 mg/dL — AB (ref 0.44–1.00)
GFR calc Af Amer: 50 mL/min — ABNORMAL LOW (ref >60)
GFR, EST NON AFRICAN AMERICAN: 43 mL/min — AB (ref >60)
Glucose, Bld: 175 mg/dL — ABNORMAL HIGH (ref 65–99)
Potassium: 4.1 mmol/L (ref 3.5–5.1)
SODIUM: 149 mmol/L — AB (ref 135–145)
Total Bilirubin: 0.2 mg/dL — ABNORMAL LOW (ref 0.3–1.2)
Total Protein: 6.2 g/dL — ABNORMAL LOW (ref 6.5–8.1)

## 2016-11-08 LAB — URINALYSIS, ROUTINE W REFLEX MICROSCOPIC
BILIRUBIN URINE: NEGATIVE
Glucose, UA: NEGATIVE mg/dL
Hgb urine dipstick: NEGATIVE
KETONES UR: NEGATIVE mg/dL
NITRITE: NEGATIVE
PH: 6 (ref 5.0–8.0)
PROTEIN: NEGATIVE mg/dL
Specific Gravity, Urine: 1.017 (ref 1.005–1.030)

## 2016-11-08 LAB — I-STAT CHEM 8, ED
BUN: 16 mg/dL (ref 6–20)
CALCIUM ION: 0.81 mmol/L — AB (ref 1.15–1.40)
Chloride: 123 mmol/L — ABNORMAL HIGH (ref 101–111)
Creatinine, Ser: 0.4 mg/dL — ABNORMAL LOW (ref 0.44–1.00)
GLUCOSE: 94 mg/dL (ref 65–99)
HEMATOCRIT: 15 % — AB (ref 36.0–46.0)
HEMOGLOBIN: 5.1 g/dL — AB (ref 12.0–15.0)
Potassium: 2.3 mmol/L — CL (ref 3.5–5.1)
SODIUM: 153 mmol/L — AB (ref 135–145)
TCO2: 15 mmol/L — ABNORMAL LOW (ref 22–32)

## 2016-11-08 LAB — CBG MONITORING, ED: GLUCOSE-CAPILLARY: 161 mg/dL — AB (ref 65–99)

## 2016-11-08 LAB — AMMONIA: Ammonia: 17 umol/L (ref 9–35)

## 2016-11-08 LAB — POC OCCULT BLOOD, ED: FECAL OCCULT BLD: NEGATIVE

## 2016-11-08 LAB — I-STAT CG4 LACTIC ACID, ED: Lactic Acid, Venous: 1.72 mmol/L (ref 0.5–1.9)

## 2016-11-08 MED ORDER — DEXTROSE 5 % IV SOLN
1.0000 g | Freq: Once | INTRAVENOUS | Status: AC
  Administered 2016-11-08: 1 g via INTRAVENOUS
  Filled 2016-11-08: qty 1

## 2016-11-08 MED ORDER — SODIUM CHLORIDE 0.9 % IV SOLN: Freq: Once | INTRAVENOUS | Status: DC

## 2016-11-08 MED ORDER — SODIUM CHLORIDE 0.45 % IV BOLUS
1000.0000 mL | Freq: Once | INTRAVENOUS | Status: AC
  Administered 2016-11-09: 1000 mL via INTRAVENOUS

## 2016-11-08 MED ORDER — SODIUM CHLORIDE 0.9 % IV SOLN
INTRAVENOUS | Status: DC
  Administered 2016-11-08 – 2016-11-09 (×2): via INTRAVENOUS

## 2016-11-08 MED ORDER — SODIUM CHLORIDE 0.9 % IV BOLUS (SEPSIS)
1000.0000 mL | Freq: Once | INTRAVENOUS | Status: AC
  Administered 2016-11-08: 1000 mL via INTRAVENOUS

## 2016-11-08 NOTE — ED Provider Notes (Signed)
[facility]-EMERGENCY DEPT Provider Note   CSN: [id number] Arrival date & time: 11/08/16  2017     History   Chief Complaint Chief Complaint  Patient presents with  . Altered Mental Status    HPI Elizabeth Keller is a 83 y.o. female.  Pt presents to the ED today with altered mental status.  Pt resides at [facility].  Family came to check on her today, and found her to not be herself.  The pt is unable to give any hx.      Past Medical History:  Diagnosis Date  . Anxiety disorder   . Arthritis   . Asthma   . Chronic kidney disease   . Degenerative disk disease   . Diabetes mellitus   . GERD (gastroesophageal reflux disease)   . Hypercholesteremia   . Hypertension   . IBS (irritable bowel syndrome)   . Sarcoidosis   . Sepsis (HCC) 06/29/2016  . Spinal stenosis     Patient Active Problem List   Diagnosis Date Noted  . Altered mental status 11/08/2016  . Hypernatremia 11/08/2016  . UTI (urinary tract infection) 11/08/2016  . Chronic CHF (congestive heart failure) (HCC) 10/15/2016  . Spinal stenosis, site unspecified 10/06/2016  . Chronic pain syndrome 10/06/2016  . ARF (acute renal failure) (HCC) 09/30/2016  . Osteoarthritis of multiple joints 09/03/2016  . Right wrist fracture, sequela 09/03/2016  . Swelling of left hand 08/31/2016  . Cognitive impairment 08/14/2016  . Anemia, chronic disease 08/14/2016  . GERD (gastroesophageal reflux disease) 08/13/2016  . Stage IV pressure ulcer of sacral region (HCC) 07/08/2016  . Decubitus ulcer of heel, bilateral, unstageable (HCC) 07/08/2016  . Dyslipidemia associated with type 2 diabetes mellitus (HCC) 07/08/2016  . Failure to thrive in adult 07/08/2016  . Weight loss, non-intentional 07/08/2016  . Anxiety disorder 07/08/2016  . Severe protein-calorie malnutrition (HCC) 06/30/2016  . Sepsis (HCC) 06/29/2016  . Severe sepsis (HCC) 05/15/2016  . Type II diabetes mellitus with renal manifestations (HCC) 04/04/2016  .  Chronic kidney disease, stage III (moderate) 03/14/2016    Past Surgical History:  Procedure Laterality Date  . EYE SURGERY  2017   cataract / glaucoma    OB History    No data available       Home Medications    Prior to Admission medications   Medication Sig Start Date End Date Taking? Authorizing Provider  acetaminophen (TYLENOL) 500 MG tablet Take 1,000 mg by mouth every 8 (eight) hours as needed for moderate pain.    Yes [provider]  albuterol (PROVENTIL HFA;VENTOLIN HFA) 108 (90 Base) MCG/ACT inhaler Inhale 2 puffs into the lungs every 6 (six) hours as needed for wheezing or shortness of breath.    Yes [provider]  albuterol (PROVENTIL) (2.5 MG/3ML) 0.083% nebulizer solution Take 2.5 mg by nebulization every 6 (six) hours as needed for wheezing or shortness of breath.   Yes [provider]  allopurinol (ZYLOPRIM) 100 MG tablet Take 50 mg by mouth daily.   Yes [provider]  aspirin 81 MG tablet Take 81 mg by mouth daily.   Yes [provider]  baclofen (LIORESAL) 10 MG tablet Take 10 mg by mouth 2 (two) times daily.   Yes [provider]  bisacodyl (DULCOLAX) 10 MG suppository Place 1 suppository (10 mg total) rectally daily as needed for moderate constipation. 05/20/16  Yes Sheikh, Omair Latif, DO  busPIRone (BUSPAR) 10 MG tablet Take 10 mg by mouth 3 (three) times daily.  Yes [provider]  clonazePAM (KLONOPIN) 0.5 MG tablet Take 0.5 tablets (0.25 mg total) by mouth 2 (two) times daily as needed for anxiety. 10/05/16  Yes Osvaldo Shipper, MD  collagenase (SANTYL) ointment Apply topically daily. Apply to sacrum and left heel wounds daily Patient taking differently: Apply 1 application topically daily. Apply to bilateral heel wounds daily 10/05/16  Yes Osvaldo Shipper, MD  diclofenac sodium (VOLTAREN) 1 % GEL APPLY 2 GRAMS EXTERNALLY TO THE AFFECTED AREA FOUR TIMES DAILY AS NEEDED FOR PAIN 06/26/16  Yes  Sandford Craze, NP  furosemide (LASIX) 20 MG tablet Take 1 tablet (20 mg total) by mouth daily. 10/06/16  Yes Osvaldo Shipper, MD  megestrol (MEGACE) 40 MG/ML suspension Take 800 mg by mouth daily.   Yes [provider]  Menthol (ICY HOT) 5 % PTCH Apply 1 patch topically daily. Apply 1 patch to lower back daily in the morning, remove after 12 hours   Yes [provider]  Multiple Vitamins-Minerals (DECUBI-VITE) CAPS Take 1 capsule by mouth daily.   Yes [provider]  omeprazole (PRILOSEC) 40 MG capsule Take 40 mg by mouth daily.    Yes [provider]  polyethylene glycol (MIRALAX / GLYCOLAX) packet Take 17 g by mouth daily. 10/06/16  Yes Osvaldo Shipper, MD  potassium chloride SA (K-DUR,KLOR-CON) 20 MEQ tablet Take 1 tablet (20 mEq total) by mouth daily. 10/05/16  Yes Osvaldo Shipper, MD  traMADol (ULTRAM) 50 MG tablet Take 1 tablet (50 mg total) by mouth every 6 (six) hours as needed. 10/05/16  Yes Osvaldo Shipper, MD    Family History Family History  Problem Relation Age of Onset  . Hypertension Other     Social History Social History  Substance Use Topics  . Smoking status: Former Games developer  . Smokeless tobacco: Never Used  . Alcohol use No     Allergies   Dicyclomine; Gabapentin; Metformin and related; Lyrica [pregabalin]; and Topiramate   Review of Systems Review of Systems  Unable to perform ROS: Dementia  All other systems reviewed and are negative.    Physical Exam Updated Vital Signs BP 116/78 (BP Location: Left Arm)   Pulse (!) 103   Temp 98.5 F (36.9 C) (Rectal)   Resp 18   SpO2 100%   Physical Exam  Constitutional: She appears well-developed and well-nourished.  HENT:  Head: Normocephalic and atraumatic.  Right Ear: External ear normal.  Left Ear: External ear normal.  Nose: Nose normal.  Mouth/Throat: Mucous membranes are dry.  Neck: Normal range of motion. Neck supple.  Cardiovascular: Regular rhythm, normal  heart sounds and intact distal pulses.  Tachycardia present.   Pulmonary/Chest: Effort normal and breath sounds normal.  Abdominal: Soft. Bowel sounds are normal.  Genitourinary:  Genitourinary Comments: Indwelling foley  Musculoskeletal: Normal range of motion.  Neurological: She is alert.  Skin: Skin is warm.     Decubitus heel ulcers  Psychiatric: She has a normal mood and affect.  Nursing note and vitals reviewed.    ED Treatments / Results  Labs (all labs ordered are listed, but only abnormal results are displayed) Labs Reviewed  COMPREHENSIVE METABOLIC PANEL - Abnormal; Notable for the following:       Result Value   Sodium 149 (*)    Chloride 113 (*)    Glucose, Bld 175 (*)    BUN 32 (*)    Creatinine, Ser 1.09 (*)    Total Protein 6.2 (*)    Albumin 2.2 (*)  ALT 8 (*)    Total Bilirubin 0.2 (*)    GFR calc non Af Amer 43 (*)    GFR calc Af Amer 50 (*)    All other components within normal limits  CBC WITH DIFFERENTIAL/PLATELET - Abnormal; Notable for the following:    WBC 14.9 (*)    RBC 1.11 (*)    Hemoglobin 3.0 (*)    HCT 9.2 (*)    RDW 23.8 (*)    Platelets 593 (*)    Neutro Abs 11.8 (*)    Monocytes Absolute 1.3 (*)    All other components within normal limits  URINALYSIS, ROUTINE W REFLEX MICROSCOPIC - Abnormal; Notable for the following:    APPearance CLOUDY (*)    Leukocytes, UA LARGE (*)    Bacteria, UA FEW (*)    Squamous Epithelial / LPF 0-5 (*)    Non Squamous Epithelial 0-5 (*)    All other components within normal limits  CBC - Abnormal; Notable for the following:    RBC 1.95 (*)    Hemoglobin 5.2 (*)    HCT 16.1 (*)    RDW 23.9 (*)    All other components within normal limits  CBG MONITORING, ED - Abnormal; Notable for the following:    Glucose-Capillary 161 (*)    All other components within normal limits  I-STAT CHEM 8, ED - Abnormal; Notable for the following:    Sodium 153 (*)    Potassium 2.3 (*)    Chloride 123 (*)     Creatinine, Ser 0.40 (*)    Calcium, Ion 0.81 (*)    TCO2 15 (*)    Hemoglobin 5.1 (*)    HCT 15.0 (*)    All other components within normal limits  URINE CULTURE  MRSA PCR SCREENING  AMMONIA  BASIC METABOLIC PANEL  MAGNESIUM  PHOSPHORUS  I-STAT CG4 LACTIC ACID, ED  POC OCCULT BLOOD, ED    EKG  EKG Interpretation None       Radiology Dg Chest 1 View  Result Date: 11/08/2016 CLINICAL DATA:  Altered mental status.  Decubitus ulcer. EXAM: CHEST 1 VIEW COMPARISON:  09/30/2016 and 05/15/2016 FINDINGS: Lungs are adequately inflated as patient is rotated to the right. There is mild opacification with coarse interstitial changes over the apices which is stable. Remainder the lungs are clear. Mild stable cardiomegaly. Remainder the exam is unchanged. IMPRESSION: Chronic changes over the lung apices.  No acute findings. Electronically Signed   By: Elberta Fortis M.D.   On: 11/08/2016 21:30   Ct Head Wo Contrast  Result Date: 11/08/2016 CLINICAL DATA:  Unexplained altered level of consciousness. Patient is responsive to verbal stimuli and afebrile. EXAM: CT HEAD WITHOUT CONTRAST TECHNIQUE: Contiguous axial images were obtained from the base of the skull through the vertex without intravenous contrast. COMPARISON:  03/15/2016 FINDINGS: Brain: Examination is limited due to motion artifact. There is diffuse cerebral atrophy. Ventricular dilatation consistent with central atrophy. Low-attenuation changes in the deep white matter consistent with small vessel ischemia. No mass-effect or midline shift. No abnormal extra-axial fluid collections. The gray-white matter junctions are distinct. The basal cisterns are not effaced. No acute intracranial hemorrhage. Vascular: Internal carotid vascular calcifications. Skull: No depressed skull fractures. Sinuses/Orbits: Paranasal sinuses and mastoid air cells are clear. Other: None. IMPRESSION: No acute intracranial abnormalities. Chronic atrophy and small vessel  ischemic changes. Electronically Signed   By: Burman Nieves M.D.   On: 11/08/2016 21:57    Procedures Procedures (including critical  care time)  Medications Ordered in ED Medications  sodium chloride 0.9 % bolus 1,000 mL (0 mLs Intravenous Stopped 11/08/16 2227)    And  0.9 %  sodium chloride infusion ( Intravenous New Bag/Given 11/08/16 2128)  0.9 %  sodium chloride infusion (not administered)  sodium chloride 0.45 % bolus 1,000 mL (not administered)  aztreonam (AZACTAM) 1 g in dextrose 5 % 50 mL IVPB (1 g Intravenous New Bag/Given 11/08/16 2313)     Initial Impression / Assessment and Plan / ED Course  I have reviewed the triage vital signs and the nursing notes.  Pertinent labs & imaging results that were available during my care of the patient were reviewed by me and considered in my medical decision making (see chart for details).  Clinical Course as of Nov 08 2349  Wynelle Link Nov 08, 2016  2345 Type and screen [facility] [[facility]]    Clinical Course User Index [[facility]] Jacalyn Lefevre, MD  Pt very anemic.  She is Jehovah's witness.  There is some confusion on who is medical poa.  The daughter with her says that she is, but she does not have any papers.  I spoke with the daughter and the other daughter who was the medical poa.  They both agree that pt would not want blood.  We are trying to get ahold of POA papers from the SNF.   Final Clinical Impressions(s) / ED Diagnoses   Final diagnoses:  Metabolic encephalopathy  Urinary tract infection associated with indwelling urethral catheter, initial encounter (HCC)  Anemia, unspecified type  Hypernatremia  Dehydration    New Prescriptions New Prescriptions   No medications on file     Jacalyn Lefevre, MD 11/08/16 2351

## 2016-11-08 NOTE — H&P (Signed)
History and Physical    Elizabeth Keller ZOX:[id number] DOB: Jun 18, 1923 DOA: 11/08/2016  PCP: Sandford Craze, NP   Patient coming from: Home.  I have personally briefly reviewed patient's old medical records in [facility]  Chief Complaint: Altered mental status.  HPI: Elizabeth Keller is a 83 y.o. female with medical history significant of anxiety, osteoarthritis, asthma, degenerative disc disease, spinal stenosis, type 2 diabetes, chronic kidney disease, GERD, hypercholesterolemia, hypertension, IBS, sarcoidosis, dysphagia who was last admitted from 09/30/2016 until 10/05/2016 for sepsis secondary to UTI and is being brought to the emergency department due to altered mental status since earlier today. She is unable to provide further history, but her daughter and son stated that she have been interacting with the staff and family members through the week, per her baseline, until the changes in mentation were noticed today. There has not been any recorded fever to their knowledge. She has a chronic indwelling Foley catheter and a chronic stage IV decubiti ulcer.  ED Course: The patient was given normal saline boluses and 1 g of aztreonam IVPB. There has been some discrepancies in her hematology and chemistry labs. Hemoglobin has been treated 0.0-5.3 g/dL. Potassium has been from 2.3-4.1 mmol/L. Repeat labs have been ordered. Please see full reports are further detail.  Review of Systems: As per HPI otherwise 10 point review of systems negative.    Past Medical History:  Diagnosis Date  . Anxiety disorder   . Arthritis   . Asthma   . Chronic kidney disease   . Degenerative disk disease   . Diabetes mellitus   . GERD (gastroesophageal reflux disease)   . Hypercholesteremia   . Hypertension   . IBS (irritable bowel syndrome)   . Sarcoidosis   . Sepsis (HCC) 06/29/2016  . Spinal stenosis     Past Surgical History:  Procedure Laterality Date  . EYE SURGERY  2017   cataract /  glaucoma     reports that she has quit smoking. She has never used smokeless tobacco. She reports that she does not drink alcohol or use drugs.  Allergies  Allergen Reactions  . Dicyclomine Other (See Comments)    Reaction:  Dizziness   . Gabapentin Other (See Comments)    Reaction:  Dizziness   . Metformin And Related Nausea And Vomiting  . Lyrica [Pregabalin] Other (See Comments)    Reaction:  Unknown  . Topiramate Other (See Comments)    Reaction:  Unknown    Family History  Problem Relation Age of Onset  . Hypertension Other     Prior to Admission medications   Medication Sig Start Date End Date Taking? Authorizing Provider  acetaminophen (TYLENOL) 500 MG tablet Take 1,000 mg by mouth every 8 (eight) hours as needed for moderate pain.    Yes [provider]  albuterol (PROVENTIL HFA;VENTOLIN HFA) 108 (90 Base) MCG/ACT inhaler Inhale 2 puffs into the lungs every 6 (six) hours as needed for wheezing or shortness of breath.    Yes [provider]  albuterol (PROVENTIL) (2.5 MG/3ML) 0.083% nebulizer solution Take 2.5 mg by nebulization every 6 (six) hours as needed for wheezing or shortness of breath.   Yes [provider]  allopurinol (ZYLOPRIM) 100 MG tablet Take 50 mg by mouth daily.   Yes [provider]  aspirin 81 MG tablet Take 81 mg by mouth daily.   Yes [provider]  baclofen (LIORESAL) 10 MG tablet Take 10 mg by mouth 2 (two) times daily.  Yes [provider]  bisacodyl (DULCOLAX) 10 MG suppository Place 1 suppository (10 mg total) rectally daily as needed for moderate constipation. 05/20/16  Yes Sheikh, Omair Latif, DO  busPIRone (BUSPAR) 10 MG tablet Take 10 mg by mouth 3 (three) times daily.   Yes [provider]  clonazePAM (KLONOPIN) 0.5 MG tablet Take 0.5 tablets (0.25 mg total) by mouth 2 (two) times daily as needed for anxiety. 10/05/16  Yes Osvaldo Shipper, MD  collagenase (SANTYL) ointment Apply  topically daily. Apply to sacrum and left heel wounds daily Patient taking differently: Apply 1 application topically daily. Apply to bilateral heel wounds daily 10/05/16  Yes Osvaldo Shipper, MD  diclofenac sodium (VOLTAREN) 1 % GEL APPLY 2 GRAMS EXTERNALLY TO THE AFFECTED AREA FOUR TIMES DAILY AS NEEDED FOR PAIN 06/26/16  Yes Sandford Craze, NP  furosemide (LASIX) 20 MG tablet Take 1 tablet (20 mg total) by mouth daily. 10/06/16  Yes Osvaldo Shipper, MD  megestrol (MEGACE) 40 MG/ML suspension Take 800 mg by mouth daily.   Yes [provider]  Menthol (ICY HOT) 5 % PTCH Apply 1 patch topically daily. Apply 1 patch to lower back daily in the morning, remove after 12 hours   Yes [provider]  Multiple Vitamins-Minerals (DECUBI-VITE) CAPS Take 1 capsule by mouth daily.   Yes [provider]  omeprazole (PRILOSEC) 40 MG capsule Take 40 mg by mouth daily.    Yes [provider]  polyethylene glycol (MIRALAX / GLYCOLAX) packet Take 17 g by mouth daily. 10/06/16  Yes Osvaldo Shipper, MD  potassium chloride SA (K-DUR,KLOR-CON) 20 MEQ tablet Take 1 tablet (20 mEq total) by mouth daily. 10/05/16  Yes Osvaldo Shipper, MD  traMADol (ULTRAM) 50 MG tablet Take 1 tablet (50 mg total) by mouth every 6 (six) hours as needed. 10/05/16  Yes Osvaldo Shipper, MD    Physical Exam: Vitals:   11/08/16 2030 11/08/16 2256  BP: 124/78 116/78  Pulse: (!) 112 (!) 103  Resp: 15 18  Temp: 98.5 F (36.9 C)   TempSrc: Rectal   SpO2: 100% 100%    Constitutional: NAD, calm, comfortable Eyes: PERRL, lids and conjunctivae normal ENMT: Mucous membranes are Dry. Posterior pharynx clear of any exudate or lesions. Neck: normal, supple, no masses, no thyromegaly Respiratory: Decreased breath sounds on bases, otherwise clear to auscultation bilaterally, no wheezing, no crackles. Normal respiratory effort. No accessory muscle use.  Cardiovascular: Regular rate and rhythm, occasional extra  beat, no murmurs / rubs / gallops. No extremity edema. 2+ pedal pulses. No carotid bruits.  Abdomen: Soft, no tenderness, no masses palpated. No hepatosplenomegaly. Bowel sounds positive.  Musculoskeletal: no clubbing / cyanosis. Good ROM, no contractures. Normal muscle tone.  Skin: Stage IV decubiti ulcer with packing and dressing. Positive dressings and soft boots on both feet. Neurologic: Responds to tactile stimuli. Unable to fully evaluate. Psychiatric: Obtunded. Response to tactile stimuli, does not follow commands.    Labs on Admission: I have personally reviewed following labs and imaging studies  CBC:  Recent Labs Lab 11/08/16 2136 11/08/16 2250 11/08/16 2303  WBC 14.9* 6.8  --   NEUTROABS 11.8*  --   --   HGB 3.0* 5.2* 5.1*  HCT 9.2* 16.1* 15.0*  MCV 82.9 82.6  --   PLT 593* 264  --    Basic Metabolic Panel:  Recent Labs Lab 11/08/16 2136 11/08/16 2303  NA 149* 153*  K 4.1 2.3*  CL 113* 123*  CO2 29  --  GLUCOSE 175* 94  BUN 32* 16  CREATININE 1.09* 0.40*  CALCIUM 8.9  --    GFR: CrCl cannot be calculated (Unknown ideal weight.). Liver Function Tests:  Recent Labs Lab 11/08/16 2136  AST 17  ALT 8*  ALKPHOS 69  BILITOT 0.2*  PROT 6.2*  ALBUMIN 2.2*   No results for input(s): LIPASE, AMYLASE in the last 168 hours.  Recent Labs Lab 11/08/16 2136  AMMONIA 17   Coagulation Profile: No results for input(s): INR, PROTIME in the last 168 hours. Cardiac Enzymes: No results for input(s): CKTOTAL, CKMB, CKMBINDEX, TROPONINI in the last 168 hours. BNP (last 3 results) No results for input(s): PROBNP in the last 8760 hours. HbA1C: No results for input(s): HGBA1C in the last 72 hours. CBG:  Recent Labs Lab 11/08/16 2142  GLUCAP 161*   Lipid Profile: No results for input(s): CHOL, HDL, LDLCALC, TRIG, CHOLHDL, LDLDIRECT in the last 72 hours. Thyroid Function Tests: No results for input(s): TSH, T4TOTAL, FREET4, T3FREE, THYROIDAB in the last 72  hours. Anemia Panel: No results for input(s): VITAMINB12, FOLATE, FERRITIN, TIBC, IRON, RETICCTPCT in the last 72 hours. Urine analysis:    Component Value Date/Time   COLORURINE YELLOW 11/08/2016 2136   APPEARANCEUR CLOUDY (A) 11/08/2016 2136   LABSPEC 1.017 11/08/2016 2136   PHURINE 6.0 11/08/2016 2136   GLUCOSEU NEGATIVE 11/08/2016 2136   HGBUR NEGATIVE 11/08/2016 2136   BILIRUBINUR NEGATIVE 11/08/2016 2136   KETONESUR NEGATIVE 11/08/2016 2136   PROTEINUR NEGATIVE 11/08/2016 2136   UROBILINOGEN 0.2 10/18/2014 2310   NITRITE NEGATIVE 11/08/2016 2136   LEUKOCYTESUR LARGE (A) 11/08/2016 2136    Radiological Exams on Admission: Dg Chest 1 View  Result Date: 11/08/2016 CLINICAL DATA:  Altered mental status.  Decubitus ulcer. EXAM: CHEST 1 VIEW COMPARISON:  09/30/2016 and 05/15/2016 FINDINGS: Lungs are adequately inflated as patient is rotated to the right. There is mild opacification with coarse interstitial changes over the apices which is stable. Remainder the lungs are clear. Mild stable cardiomegaly. Remainder the exam is unchanged. IMPRESSION: Chronic changes over the lung apices.  No acute findings. Electronically Signed   By: Elberta Fortis M.D.   On: 11/08/2016 21:30   Ct Head Wo Contrast  Result Date: 11/08/2016 CLINICAL DATA:  Unexplained altered level of consciousness. Patient is responsive to verbal stimuli and afebrile. EXAM: CT HEAD WITHOUT CONTRAST TECHNIQUE: Contiguous axial images were obtained from the base of the skull through the vertex without intravenous contrast. COMPARISON:  03/15/2016 FINDINGS: Brain: Examination is limited due to motion artifact. There is diffuse cerebral atrophy. Ventricular dilatation consistent with central atrophy. Low-attenuation changes in the deep white matter consistent with small vessel ischemia. No mass-effect or midline shift. No abnormal extra-axial fluid collections. The gray-white matter junctions are distinct. The basal cisterns are  not effaced. No acute intracranial hemorrhage. Vascular: Internal carotid vascular calcifications. Skull: No depressed skull fractures. Sinuses/Orbits: Paranasal sinuses and mastoid air cells are clear. Other: None. IMPRESSION: No acute intracranial abnormalities. Chronic atrophy and small vessel ischemic changes. Electronically Signed   By: Burman Nieves M.D.   On: 11/08/2016 21:57    EKG: Independently reviewed.   Assessment/Plan Principal Problem:   Altered mental status Likely secondary to anemia, UTI and electrolyte imbalance. Admit to stepdown/inpatient. Continue IV hydration. Continue IV antibiotics. Replace electrolytes.  Active Problems:   UTI (urinary tract infection) Recent urine culture and sensitivity showed Proteus that was sensitive to all antibiotics except for nitrofurantoin. She received aztreonam in the emergency department. I  will start Rocephin 1 g IVPB every 24 hours. Follow-up urine culture and sensitivity. Follow-up blood cultures and sensitivity.    Anemia, chronic disease The patient is a TEFL teacher Witness and POAs have declined blood product administration. They mentioned that they do not agree with her mother's decision in regards to this, but they are following her wishes.  Anemia profile earlier this year showed low iron, but elevated ferritin. Monitor H&H daily.     Hypernatremia Begin 0.45% sodium chloride infusion. Follow-up electrolytes and treat accordingly.    Chronic kidney disease, stage III (moderate) Monitor renal function and electrolytes.    Type II diabetes mellitus with renal manifestations (HCC) CBG monitoring every 6 hours following unable to have oral intake. CBG monitoring before meals and at bedtime once alert enough to eat. Consider regular insulin sliding scale if blood glucose rises.    Stage IV pressure ulcer of sacral region [facility]) Continue local care. Consult wound/ostomy in a.m.    GERD (gastroesophageal reflux  disease)  Protonix 40 mg IVPB every 24 hours in the presence of unclear etiology anemia. Switch to oral Protonix once the patient is alert enough to swallow and not an aspiration risk.    Chronic CHF (congestive heart failure) (HCC) No sign of decompensation at this time.     DVT prophylaxis: SCDs.  Code Status: DO NOT RESUSCITATE/DO NOT INTUBATE.  Family Communication: Her daughter Elizabeth Keller and her son Elizabeth Keller were present in the ED room. She is the POA and he is "the backup". However they are making decisions in consensus with other siblings, except for their sister Elizabeth Keller, which they insisted she did not make any decisions in regards to their mother's care. Disposition Plan: Admitted for IV hydration, electrolyte imbalance correction and UTI treatment.  Consults called:  Admission status: Inpatient/stepdown.  Bobette Mo MD [facility] Pager [phone].  If 7PM-7AM, please contact night-coverage [email or web address] Password [facility]  11/08/2016, 11:30 PM   This document was dictated with Dragon speech recognition software and may contain some unintended errors.

## 2016-11-08 NOTE — ED Notes (Signed)
Dr. Particia Nearing notified of critical hgb lab result.

## 2016-11-08 NOTE — ED Triage Notes (Signed)
Pt has ben presented from [facility], for evaluation of "not being herself" per family request. She is responsive to verbal stimuli, afebrile and unable to offer much hx. Of note, pt has an unstagable debubitus ulcer approx 4x5in with scant drainage to passed the wound dressing.

## 2016-11-08 NOTE — ED Notes (Signed)
Bed: JY78 Expected date:  Expected time:  Means of arrival:  Comments: 80f AMS

## 2016-11-09 DIAGNOSIS — A419 Sepsis, unspecified organism: Secondary | ICD-10-CM

## 2016-11-09 DIAGNOSIS — D649 Anemia, unspecified: Secondary | ICD-10-CM

## 2016-11-09 DIAGNOSIS — N390 Urinary tract infection, site not specified: Secondary | ICD-10-CM

## 2016-11-09 DIAGNOSIS — E870 Hyperosmolality and hypernatremia: Secondary | ICD-10-CM

## 2016-11-09 LAB — BASIC METABOLIC PANEL
ANION GAP: 7 (ref 5–15)
BUN: 25 mg/dL — ABNORMAL HIGH (ref 6–20)
CALCIUM: 8.3 mg/dL — AB (ref 8.9–10.3)
CHLORIDE: 112 mmol/L — AB (ref 101–111)
CO2: 25 mmol/L (ref 22–32)
Creatinine, Ser: 0.83 mg/dL (ref 0.44–1.00)
GFR calc Af Amer: >60 mL/min (ref >60)
GFR calc non Af Amer: 60 mL/min — ABNORMAL LOW (ref >60)
Glucose, Bld: 125 mg/dL — ABNORMAL HIGH (ref 65–99)
Potassium: 3.6 mmol/L (ref 3.5–5.1)
Sodium: 144 mmol/L (ref 135–145)

## 2016-11-09 LAB — MAGNESIUM: Magnesium: 2 mg/dL (ref 1.7–2.4)

## 2016-11-09 LAB — CBC WITH DIFFERENTIAL/PLATELET
BASOS ABS: 0 10*3/uL (ref 0.0–0.1)
Basophils Relative: 0 %
EOS ABS: 0.2 10*3/uL (ref 0.0–0.7)
Eosinophils Relative: 2 %
HCT: 25.1 % — ABNORMAL LOW (ref 36.0–46.0)
Hemoglobin: 8.1 g/dL — ABNORMAL LOW (ref 12.0–15.0)
LYMPHS ABS: 1 10*3/uL (ref 0.7–4.0)
Lymphocytes Relative: 11 %
MCH: 26.1 pg (ref 26.0–34.0)
MCHC: 32.3 g/dL (ref 30.0–36.0)
MCV: 81 fL (ref 78.0–100.0)
MONO ABS: 0.6 10*3/uL (ref 0.1–1.0)
Monocytes Relative: 7 %
NEUTROS PCT: 80 %
Neutro Abs: 7 10*3/uL (ref 1.7–7.7)
PLATELETS: 407 10*3/uL — AB (ref 150–400)
RBC: 3.1 MIL/uL — AB (ref 3.87–5.11)
RDW: 23.5 % — AB (ref 11.5–15.5)
WBC: 8.8 10*3/uL (ref 4.0–10.5)

## 2016-11-09 LAB — NO BLOOD PRODUCTS

## 2016-11-09 LAB — CBG MONITORING, ED
GLUCOSE-CAPILLARY: 112 mg/dL — AB (ref 65–99)
Glucose-Capillary: 121 mg/dL — ABNORMAL HIGH (ref 65–99)

## 2016-11-09 LAB — PHOSPHORUS: PHOSPHORUS: 2.7 mg/dL (ref 2.5–4.6)

## 2016-11-09 LAB — GLUCOSE, CAPILLARY: Glucose-Capillary: 88 mg/dL (ref 65–99)

## 2016-11-09 MED ORDER — ONDANSETRON HCL 4 MG PO TABS: 4.0000 mg | ORAL_TABLET | Freq: Four times a day (QID) | ORAL | Status: DC | PRN

## 2016-11-09 MED ORDER — DEXTROSE 5 % IV SOLN
1.0000 g | INTRAVENOUS | Status: DC
  Administered 2016-11-09 – 2016-11-12 (×4): 1 g via INTRAVENOUS
  Filled 2016-11-09 (×4): qty 10

## 2016-11-09 MED ORDER — ONDANSETRON HCL 4 MG/2ML IJ SOLN: 4.0000 mg | Freq: Four times a day (QID) | INTRAMUSCULAR | Status: DC | PRN

## 2016-11-09 MED ORDER — INSULIN ASPART 100 UNIT/ML ~~LOC~~ SOLN
0.0000 [IU] | SUBCUTANEOUS | Status: DC
  Administered 2016-11-09: 1 [IU] via SUBCUTANEOUS
  Administered 2016-11-10: 2 [IU] via SUBCUTANEOUS
  Administered 2016-11-10: 1 [IU] via SUBCUTANEOUS
  Administered 2016-11-10 (×2): 2 [IU] via SUBCUTANEOUS
  Administered 2016-11-11: 1 [IU] via SUBCUTANEOUS
  Administered 2016-11-11: 2 [IU] via SUBCUTANEOUS
  Administered 2016-11-11 – 2016-11-12 (×4): 1 [IU] via SUBCUTANEOUS
  Filled 2016-11-09: qty 1

## 2016-11-09 MED ORDER — SODIUM CHLORIDE 0.45 % IV SOLN
INTRAVENOUS | Status: DC
  Administered 2016-11-09: 1000 mL via INTRAVENOUS

## 2016-11-09 MED ORDER — ACETAMINOPHEN 650 MG RE SUPP
650.0000 mg | RECTAL | Status: DC | PRN
  Administered 2016-11-09 – 2016-11-11 (×4): 650 mg via RECTAL
  Filled 2016-11-09 (×4): qty 1

## 2016-11-09 MED ORDER — SODIUM CHLORIDE 0.45 % IV SOLN
INTRAVENOUS | Status: DC
  Administered 2016-11-09: 13:00:00 via INTRAVENOUS

## 2016-11-09 NOTE — Clinical Social Work Note (Signed)
Clinical Social Work Assessment  Patient Details  Name: Elizabeth Keller MRN: [id number] Date of Birth: [birth year]  Date of referral:  11/09/16               Reason for consult:  Elizabeth SasakiPlanning                Permission sought to share information with:  Case Manager, Facility Medical sales representative, Family Supports Permission granted to share information::  Yes, Verbal Permission Granted  Name::        Agency::  [facility]  Relationship::  Daughter:  [name]  Contact Information:     Housing/Transportation Living arrangements for the past 2 months:  Skilled Building surveyor of Information:  Medical Team, Case Production designer, theatre/television/film, Facility Patient Interpreter Needed:  None Criminal Activity/Legal Involvement Pertinent to Current Situation/Hospitalization:  No - Comment as needed Significant Relationships:  Adult Children, Other Family Members Lives with:  Facility Resident Do you feel safe going back to the place where you live?  Yes Need for family participation in patient care:  Yes (Comment)  Care giving concerns:  Patient admitted to [facility]:  Pt has ben presented from [facility], for evaluation of "not being herself" per family request. She is responsive to verbal stimuli, afebrile and unable to offer much hx. Of note, pt has an unstagable debubitus ulcer approx 4x5in with scant drainage to passed the wound dressing.  Patient recently placed at [facility] 10/05/16. Call placed to facility to notify of patient being admitted. Kirsten was unsure if family has completed bed hold at this time. Family present at bedside with patient. Will anticipate patient returning to SNF once medically cleared.   She has a chronic indwelling Foley catheter and a chronic stage IV decubiti ulcer.  Social Worker assessment / plan:  Patient seen in ED and assessment completed. She is from a facility, recently placed and most likely will return to facility at discharge. No barriers reported  regarding this plan. Facility has no barriers unless patient does not complete bed hold and no beds at time of DC.  FL2 to be updated once patient is medically stable reflecting needs at discharge. Will continue to follow and will have unit CSW follow once admitted.  Employment status:  Retired Health and safety inspector:  Medicare PT Recommendations:  Not assessed at this time (Will be seen on inpatient unit) Information / Referral to community resources:     Patient/Family's Response to care:  Understanding  Patient/Family's Understanding of and Emotional Response to Diagnosis, Current Treatment, and Prognosis:  Family at bedside with patient and very active in her care.  Family was the ones who noticed change in mental state and requesting admission to ED.  Aware of plans and in agreement.  Emotional Assessment Appearance:  Appears stated age Attitude/Demeanor/Rapport:    Affect (typically observed):  Accepting, Adaptable Orientation:  Oriented to Self, Oriented to Place Alcohol / Substance use:  Not Applicable Psych involvement (Current and /or in the community):  No (Comment)  Discharge Needs  Concerns to be addressed:  Denies Needs/Concerns at this time Readmission within the last 30 days:  Yes Current discharge risk:  None Barriers to Discharge:  No Barriers Identified, Continued Medical Work up   Raye Sorrow, LCSW 11/09/2016, 1:37 PM

## 2016-11-09 NOTE — ED Notes (Signed)
Unable to collect lab work. Stuck pt for blood draw but was unsuccessful.

## 2016-11-09 NOTE — ED Notes (Signed)
Introduced self to family at patient's bedside. Updated patient's family on POC. Patient repositioned to the left. Patient provided with another warm blanket. VSS. IV antibiotic administered per order. No further needs at this time.

## 2016-11-09 NOTE — Progress Notes (Signed)
PROGRESS NOTE    Elizabeth Keller  ZOX:[id number] DOB: [birth year] DOA: 11/08/2016 PCP: Elizabeth Craze, NP    Brief Narrative:  83 y.o. female with medical history significant of anxiety, osteoarthritis, asthma, degenerative disc disease, spinal stenosis, type 2 diabetes, chronic kidney disease, GERD, hypercholesterolemia, hypertension, IBS, sarcoidosis, dysphagia who was last admitted from 09/30/2016 until 10/05/2016 for sepsis secondary to UTI and is being brought to the emergency department due to altered mental status since earlier today. She is unable to provide further history, but her daughter and son stated that she have been interacting with the staff and family members through the week, per her baseline, until the changes in mentation were noticed today. There has not been any recorded fever to their knowledge. She has a chronic indwelling Foley catheter and a chronic stage IV decubiti ulcer.  Assessment & Plan:   Principal Problem:   Altered mental status Active Problems:   Chronic kidney disease, stage III (moderate) (HCC)   Type II diabetes mellitus with renal manifestations (HCC)   Stage IV pressure ulcer of sacral region (HCC)   GERD (gastroesophageal reflux disease)   Anemia, chronic disease   Chronic CHF (congestive heart failure) (HCC)   Hypernatremia   UTI (urinary tract infection)  Principal Problem:   Altered mental status, toxic metabolic encephalopathy Likely secondary UTI and electrolyte imbalance. Pt continued on empiric rocephin Repeat bmet in AM, correct electrolytes as needed  Active Problems:   UTI (urinary tract infection) Recent urine culture and sensitivity showed Proteus that was sensitive to all antibiotics except for nitrofurantoin. She received aztreonam in the emergency department. Continue Rocephin 1 g IVPB every 24 hours. Urine culture pending    Anemia, chronic disease The patient is a [employer]'s [employer] and POAs have declined blood  product administration. They mentioned that they do not agree with her mother's decision in regards to this, but they are following her wishes.  Hgb stable at 8.1. Continue to monitor     Hypernatremia started 0.45% sodium chloride infusion. Sodium improving Repeat bmet in AM    Chronic kidney disease, stage III (moderate) Monitor renal function and electrolytes. Repeat bmet in AM    Type II diabetes mellitus with renal manifestations (HCC) Continue on SSI coverage q4hrs    Stage IV pressure ulcer of sacral region [facility]) Continue local care. Will place consult for wound/ostomy    GERD (gastroesophageal reflux disease)  Continue Protonix 40 mg IVPB every 24 hours in the presence of unclear etiology anemia. Anticipate transition to PO meds when more awake    Chronic CHF (congestive heart failure) (HCC) Appears stable at this time  Afib RVR Will start metoprolol for rate control Continue IVF hydration as tolerated  DVT prophylaxis: SCD's Code Status: DNR Family Communication: Pt in room, family at bedside Disposition Plan: Uncertain at this time  Consultants:     Procedures:     Antimicrobials: Anti-infectives    Start     Dose/Rate Route Frequency Ordered Stop   11/09/16 1115  cefTRIAXone (ROCEPHIN) 1 g in dextrose 5 % 50 mL IVPB     1 g 100 mL/hr over 30 Minutes Intravenous Every 24 hours 11/09/16 1101     11/08/16 2245  aztreonam (AZACTAM) 1 g in dextrose 5 % 50 mL IVPB     1 g 100 mL/hr over 30 Minutes Intravenous  Once 11/08/16 2244 11/08/16 2343       Subjective: Unable to assess given mental status  Objective: Vitals:   11/09/16 1400  11/09/16 1430 11/09/16 1500 11/09/16 1600  BP: (!) 98/58 109/62 105/60 (!) 118/56  Pulse: (!) 112 93 99 (!) 109  Resp: (!) 24 (!) 25 (!) 22 (!) 24  Temp:      TempSrc:      SpO2: 96% 98% 98% 99%    Intake/Output Summary (Last 24 hours) at 11/09/16 1623 Last data filed at 11/09/16 1311  Gross per 24 hour    Intake          2608.33 ml  Output                0 ml  Net          2608.33 ml   There were no vitals filed for this visit.  Examination:  General exam: Appears calm and comfortable  Respiratory system: Clear to auscultation. Respiratory effort normal. Cardiovascular system: S1 & S2 heard, tachycardic, s1, s2 Gastrointestinal system: Abdomen is nondistended, soft  No organomegaly or masses felt.  Central nervous system: Alert and oriented. No focal neurological deficits. Extremities: Symmetric 5 x 5 power. Skin: No rashes, lesions Psychiatry: unable to assess given encephalopathy  Data Reviewed: I have personally reviewed following labs and imaging studies  CBC:  Recent Labs Lab 11/08/16 2136 11/08/16 2250 11/08/16 2303 11/09/16 1117  WBC 14.9* 6.8  --  8.8  NEUTROABS 11.8*  --   --  7.0  HGB 3.0* 5.2* 5.1* 8.1*  HCT 9.2* 16.1* 15.0* 25.1*  MCV 82.9 82.6  --  81.0  PLT 593* 264  --  407*   Basic Metabolic Panel:  Recent Labs Lab 11/08/16 2136 11/08/16 2303 11/09/16 1117  NA 149* 153* 144  K 4.1 2.3* 3.6  CL 113* 123* 112*  CO2 29  --  25  GLUCOSE 175* 94 125*  BUN 32* 16 25*  CREATININE 1.09* 0.40* 0.83  CALCIUM 8.9  --  8.3*  MG  --   --  2.0  PHOS  --   --  2.7   GFR: CrCl cannot be calculated (Unknown ideal weight.). Liver Function Tests:  Recent Labs Lab 11/08/16 2136  AST 17  ALT 8*  ALKPHOS 69  BILITOT 0.2*  PROT 6.2*  ALBUMIN 2.2*   No results for input(s): LIPASE, AMYLASE in the last 168 hours.  Recent Labs Lab 11/08/16 2136  AMMONIA 17   Coagulation Profile: No results for input(s): INR, PROTIME in the last 168 hours. Cardiac Enzymes: No results for input(s): CKTOTAL, CKMB, CKMBINDEX, TROPONINI in the last 168 hours. BNP (last 3 results) No results for input(s): PROBNP in the last 8760 hours. HbA1C: No results for input(s): HGBA1C in the last 72 hours. CBG:  Recent Labs Lab 11/08/16 2142  GLUCAP 161*   Lipid  Profile: No results for input(s): CHOL, HDL, LDLCALC, TRIG, CHOLHDL, LDLDIRECT in the last 72 hours. Thyroid Function Tests: No results for input(s): TSH, T4TOTAL, FREET4, T3FREE, THYROIDAB in the last 72 hours. Anemia Panel: No results for input(s): VITAMINB12, FOLATE, FERRITIN, TIBC, IRON, RETICCTPCT in the last 72 hours. Sepsis Labs:  Recent Labs Lab 11/08/16 2155  LATICACIDVEN 1.72    No results found for this or any previous visit (from the past 240 hour(s)).   Radiology Studies: Dg Chest 1 View  Result Date: 11/08/2016 CLINICAL DATA:  Altered mental status.  Decubitus ulcer. EXAM: CHEST 1 VIEW COMPARISON:  09/30/2016 and 05/15/2016 FINDINGS: Lungs are adequately inflated as patient is rotated to the right. There is mild opacification with coarse interstitial changes  over the apices which is stable. Remainder the lungs are clear. Mild stable cardiomegaly. Remainder the exam is unchanged. IMPRESSION: Chronic changes over the lung apices.  No acute findings. Electronically Signed   By: Elberta Fortis M.D.   On: 11/08/2016 21:30   Ct Head Wo Contrast  Result Date: 11/08/2016 CLINICAL DATA:  Unexplained altered level of consciousness. Patient is responsive to verbal stimuli and afebrile. EXAM: CT HEAD WITHOUT CONTRAST TECHNIQUE: Contiguous axial images were obtained from the base of the skull through the vertex without intravenous contrast. COMPARISON:  03/15/2016 FINDINGS: Brain: Examination is limited due to motion artifact. There is diffuse cerebral atrophy. Ventricular dilatation consistent with central atrophy. Low-attenuation changes in the deep white matter consistent with small vessel ischemia. No mass-effect or midline shift. No abnormal extra-axial fluid collections. The gray-white matter junctions are distinct. The basal cisterns are not effaced. No acute intracranial hemorrhage. Vascular: Internal carotid vascular calcifications. Skull: No depressed skull fractures. Sinuses/Orbits:  Paranasal sinuses and mastoid air cells are clear. Other: None. IMPRESSION: No acute intracranial abnormalities. Chronic atrophy and small vessel ischemic changes. Electronically Signed   By: Burman Nieves M.D.   On: 11/08/2016 21:57    Scheduled Meds: Continuous Infusions: . sodium chloride 100 mL/hr at 11/09/16 1242  . sodium chloride Stopped (11/09/16 1242)  . sodium chloride    . cefTRIAXone (ROCEPHIN)  IV Stopped (11/09/16 1311)     LOS: 1 day   Yzabella Crunk, Scheryl Marten, MD [facility] Pager [phone]  If 7PM-7AM, please contact night-coverage [email or web address] Password [email or web address] 11/09/2016, 4:23 PM

## 2016-11-09 NOTE — Progress Notes (Signed)
Called by RN:  Fever 102.3 PR.  Ordering PRN tylenol sup.

## 2016-11-09 NOTE — ED Notes (Signed)
Patient repositioned and turned to the right. Patient VSS at this time. Patient daughter at bedside. Updated daughter on POC.

## 2016-11-09 NOTE — ED Notes (Signed)
Patient repositioned using foam roller provided by patient. Patient tolerated well. Patient POC glucose checked per order. IVF infusing per order. VSS. Urethral catheter assessed and emptied.

## 2016-11-09 NOTE — ED Notes (Signed)
Patient turned with foam roller. Patient VSS. Daughter at bedside. Updated daughter on POC.

## 2016-11-09 NOTE — ED Notes (Addendum)
Patient is resting with family at bedside.  No signs of distress noted

## 2016-11-10 ENCOUNTER — Encounter (HOSPITAL_COMMUNITY): Payer: Self-pay

## 2016-11-10 DIAGNOSIS — D638 Anemia in other chronic diseases classified elsewhere: Secondary | ICD-10-CM

## 2016-11-10 LAB — GLUCOSE, CAPILLARY
GLUCOSE-CAPILLARY: 165 mg/dL — AB (ref 65–99)
GLUCOSE-CAPILLARY: 125 mg/dL — AB (ref 65–99)
GLUCOSE-CAPILLARY: 101 mg/dL — AB (ref 65–99)
Glucose-Capillary: 171 mg/dL — ABNORMAL HIGH (ref 65–99)
Glucose-Capillary: 162 mg/dL — ABNORMAL HIGH (ref 65–99)

## 2016-11-10 LAB — COMPREHENSIVE METABOLIC PANEL
ALBUMIN: 2 g/dL — AB (ref 3.5–5.0)
ALK PHOS: 56 U/L (ref 38–126)
ALT: 7 U/L — AB (ref 14–54)
AST: 14 U/L — ABNORMAL LOW (ref 15–41)
Anion gap: 9 (ref 5–15)
BILIRUBIN TOTAL: 0.6 mg/dL (ref 0.3–1.2)
BUN: 25 mg/dL — AB (ref 6–20)
CALCIUM: 8 mg/dL — AB (ref 8.9–10.3)
CO2: 21 mmol/L — AB (ref 22–32)
CREATININE: 0.89 mg/dL (ref 0.44–1.00)
Chloride: 111 mmol/L (ref 101–111)
GFR calc Af Amer: >60 mL/min (ref >60)
GFR calc non Af Amer: 55 mL/min — ABNORMAL LOW (ref >60)
GLUCOSE: 158 mg/dL — AB (ref 65–99)
Potassium: 3.7 mmol/L (ref 3.5–5.1)
SODIUM: 141 mmol/L (ref 135–145)
TOTAL PROTEIN: 5.1 g/dL — AB (ref 6.5–8.1)

## 2016-11-10 LAB — CBC WITH DIFFERENTIAL/PLATELET
BASOS ABS: 0 10*3/uL (ref 0.0–0.1)
Basophils Relative: 0 %
EOS PCT: 1 %
Eosinophils Absolute: 0.1 10*3/uL (ref 0.0–0.7)
HEMATOCRIT: 24.1 % — AB (ref 36.0–46.0)
HEMOGLOBIN: 7.9 g/dL — AB (ref 12.0–15.0)
LYMPHS PCT: 4 %
Lymphs Abs: 0.5 10*3/uL — ABNORMAL LOW (ref 0.7–4.0)
MCH: 26.7 pg (ref 26.0–34.0)
MCHC: 32.8 g/dL (ref 30.0–36.0)
MCV: 81.4 fL (ref 78.0–100.0)
MONO ABS: 1 10*3/uL (ref 0.1–1.0)
MONOS PCT: 7 %
Neutro Abs: 12 10*3/uL — ABNORMAL HIGH (ref 1.7–7.7)
Neutrophils Relative %: 88 %
OTHER: 0 %
Platelets: 410 10*3/uL — ABNORMAL HIGH (ref 150–400)
RBC: 2.96 MIL/uL — AB (ref 3.87–5.11)
RDW: 23.4 % — ABNORMAL HIGH (ref 11.5–15.5)
WBC: 13.6 10*3/uL — AB (ref 4.0–10.5)

## 2016-11-10 LAB — URINE CULTURE: Culture: >=100000 — AB

## 2016-11-10 LAB — MRSA PCR SCREENING: MRSA by PCR: POSITIVE — AB

## 2016-11-10 MED ORDER — CHLORHEXIDINE GLUCONATE CLOTH 2 % EX PADS
6.0000 | MEDICATED_PAD | Freq: Every day | CUTANEOUS | Status: DC
  Administered 2016-11-11 – 2016-11-12 (×2): 6 via TOPICAL

## 2016-11-10 MED ORDER — MUPIROCIN 2 % EX OINT
1.0000 "application " | TOPICAL_OINTMENT | Freq: Two times a day (BID) | CUTANEOUS | Status: DC
  Administered 2016-11-10 – 2016-11-12 (×4): 1 via NASAL
  Filled 2016-11-10 (×2): qty 22

## 2016-11-10 MED ORDER — DEXTROSE-NACL 5-0.45 % IV SOLN
INTRAVENOUS | Status: DC
  Administered 2016-11-10 – 2016-11-11 (×2): via INTRAVENOUS

## 2016-11-10 MED ORDER — FLUCONAZOLE IN SODIUM CHLORIDE 200-0.9 MG/100ML-% IV SOLN
200.0000 mg | INTRAVENOUS | Status: DC
  Administered 2016-11-10: 200 mg via INTRAVENOUS
  Filled 2016-11-10: qty 100

## 2016-11-10 MED ORDER — ENSURE ENLIVE PO LIQD
237.0000 mL | Freq: Two times a day (BID) | ORAL | Status: DC
  Administered 2016-11-11 – 2016-11-12 (×3): 237 mL via ORAL

## 2016-11-10 MED ORDER — RESOURCE THICKENUP CLEAR PO POWD
ORAL | Status: DC | PRN
  Filled 2016-11-10 (×2): qty 125

## 2016-11-10 NOTE — Consult Note (Signed)
WOC Nurse wound consult note Reason for Consult: Consult requested for bilat heels and sacrum.  Pt is familiar to [facility] team from recent admission, refer to consult note on 8/23. Pt is emaciated and frequently incontinent of stool; it is difficult to keep the location from becoming soiled related to the close proximity to the rectum.  Family member was at the bedside to assess the wounds and discuss plan of care. Wound type: Sacrum with chronic stage 4 pressure injury; 8X12X1cm with 1 cm undermining to wound edges from 9:00 o'clock to 12:00 o'clock.  Wound is 90% red, 10% slough/eschar at the edges.  Bone is palpable, no odor, mod amt tan drainage. Left heel with chronic stage 4 pressure injury; 4X4cm, 90% red, 10% white macerated edges, bone palpable, no odor, mod amt green-tinged drainage. Right heel with chronic stage 4 pressure injuries; .3X.3X.2cm and .8X.8X.2cm, both red and moist, bone palpable, no odor, mod amt tan drainage. Pressure Injury POA: Yes Dressing procedure/placement/frequency: Prevalon boots to reduce pressure, and air mattress ordered.  Discussed importance of protein intake.  Moist gauze packing to sacrum to promote healing, Aquacel to absorb drainage and provide antimicrobial benefits to heels, foam dressings to protect from further injury. Please re-consult if further assistance is needed.  Thank-you,  Cammie Mcgee MSN, RN, CWOCN, [place], CNS [phone]

## 2016-11-10 NOTE — Evaluation (Signed)
Clinical/Bedside Swallow Evaluation Patient Details  Name: Elizabeth Keller MRN: [id number] Date of Birth: June 07, 1923  Today's Date: 11/10/2016 Time: SLP Start Time (ACUTE ONLY): 1430 SLP Stop Time (ACUTE ONLY): 1507 SLP Time Calculation (min) (ACUTE ONLY): 37 min  Past Medical History:  Past Medical History:  Diagnosis Date  . Anxiety disorder   . Arthritis   . Asthma   . Chronic kidney disease   . Degenerative disk disease   . Diabetes mellitus   . GERD (gastroesophageal reflux disease)   . Hypercholesteremia   . Hypertension   . IBS (irritable bowel syndrome)   . Sarcoidosis   . Sepsis (HCC) 06/29/2016  . Spinal stenosis    Past Surgical History:  Past Surgical History:  Procedure Laterality Date  . EYE SURGERY  2017   cataract / glaucoma   HPI:  83 yo female with hx of dysphagia, CHF, GERD, DM, CKD, FTT, anxiety, cognitive impairment, last admitted 8/22-27/18 for sepsis secondary to UTI; brought to ED with MS changes.  Recent OP MBS (11/04/16) revealed moderate oral and mild pharyngeal dysphagia with impaired timing of swallow leading to silent aspiration of thin liquids.  Recs were made for dysphagia 1, nectar-thick liquids. Two clinical swallowing evaluations were completed earlier this year (08/18 and 02/18), both recommending soft or chopped foods but with thin liquids. Pt's family states that pt refuses purees; they request a dysphagia 2 diet.    Assessment / Plan / Recommendation Clinical Impression  Pt presents with ongoing dysphagia; clinical presentation is consistent with findings from 11/04/16 MBS.  Pt's son and dtr-in-law present. Pt requires total assist for feeding; encouragement needed.  Family attentive.  Prolonged oral preparation of dysphagia 2 lunch textures are evident, but son states his mother will not eat pureed foods.  There were clinical signs of aspiration at bedside (MBS notable for silent aspiration) with thin liquids.  Pt willing to consume nectar-thick  liquids and appeared to tolerate well.  Educated son at length re: preparing thickened liquids; feeding strategies; facilitating sensory input by hand-over-hand feeding intermittently throughout meal. Continue dysphagia 2, will change liquids to nectar.  SLP to follow for safety, ongoing family education.  SLP Visit Diagnosis: Dysphagia, oropharyngeal phase (R13.12)    Aspiration Risk  Moderate aspiration risk    Diet Recommendation   dys2/nectar thick liquids  Medication Administration: Crushed with puree    Other  Recommendations Other Recommendations: Order thickener from pharmacy   Follow up Recommendations Skilled Nursing facility      Frequency and Duration min 2x/week  1 week       Prognosis Prognosis for Safe Diet Advancement: Fair      Swallow Study   General HPI: 83 yo female with hx of dysphagia, CHF, GERD, DM, CKD, FTT, anxiety, cognitive impairment, last admitted 8/22-27/18 for sepsis secondary to UTI; brought to ED with MS changes.  Recent OP MBS (11/04/16) revealed moderate oral and mild pharyngeal dysphagia with impaired timing of swallow leading to silent aspiration of thin liquids.  Recs were made for dysphagia 1, nectar-thick liquids. Two clinical swallowing evaluations were completed earlier this year (08/18 and 02/18), both recommending soft or chopped foods but with thin liquids. Pt's family states that pt refuses purees; they request a dysphagia 2 diet.  Type of Study: Bedside Swallow Evaluation Previous Swallow Assessment: MBS: Pt has a moderate oral and mild pharyngeal dysphagia that is likely cognitive in nature. It is marked by limited oral acceptance with solids, requiring encouragement from daughter to take  small bites of both pureed and soft solids. Oral holding is prolonged and oral transit requires Max cueing but is not facilitated by liquid washes. There is some lingual residue that remains in her oral cavity that ultimately is cleared by a second swallow.  Pharyngeally she does relatively well, with no significant residue remaining post-swallow. She has impaired timing with thin liquids, which are silently aspirated before the swallow, and cannot be cleared as pt does not follow commands to cough. If the pt/family goals are to maximize safety and reduce aspiration, I would recommend Dys 1 diet and nectar thick liquids; however, it also seems like their priorities may be to allow the patient to have the food/drink options that she prefers. They may wish to liberalize to Dys 2 diet, which would be effortful and laborious with limited intake, but per the daughter's report she already has minimal intake. Will defer diet recommendation to primary SLP, who can see pt across the context of a meal. Diet Prior to this Study: Dysphagia 2 (chopped);Thin liquids Temperature Spikes Noted: Yes Respiratory Status: Room air History of Recent Intubation: No Behavior/Cognition: Alert;Requires cueing Oral Cavity Assessment: Within Functional Limits Oral Care Completed by SLP: No Oral Cavity - Dentition: Edentulous Self-Feeding Abilities: Total assist Patient Positioning: Upright in bed Baseline Vocal Quality: Normal Volitional Cough: Cognitively unable to elicit Volitional Swallow: Able to elicit    Oral/Motor/Sensory Function Overall Oral Motor/Sensory Function: Generalized oral weakness   Ice Chips Ice chips: Not tested   Thin Liquid Thin Liquid: Impaired Presentation: Spoon Pharyngeal  Phase Impairments: Throat Clearing - Immediate    Nectar Thick Nectar Thick Liquid: Within functional limits   Honey Thick Honey Thick Liquid: Not tested   Puree Puree: Impaired Presentation: Spoon Oral Phase Functional Implications: Prolonged oral transit   Solid   GO   Solid: Impaired Oral Phase Functional Implications: Prolonged oral transit        [name] [name] 11/10/2016,3:23 PM

## 2016-11-10 NOTE — Progress Notes (Signed)
Pt family is in room with patient, MD and RN have talked to patient/family about the risks of eating anything until a speech evaluation is done. However, patient/family would like to still try and eat and not wait for the speech evaluation yet. MD and RN told patient/family that patient is at risk for aspiration. MD is aware that family is trying to feed patient, told to document and continue to monitor patient.  Rockne Coons, RN

## 2016-11-10 NOTE — Progress Notes (Addendum)
PROGRESS NOTE    Elizabeth Keller  ZOX:[id number] DOB: 08/22/1923 DOA: 11/08/2016 PCP: Sandford Craze, NP    Brief Narrative:  83 y.o. female with medical history significant of anxiety, osteoarthritis, asthma, degenerative disc disease, spinal stenosis, type 2 diabetes, chronic kidney disease, GERD, hypercholesterolemia, hypertension, IBS, sarcoidosis, dysphagia who was last admitted from 09/30/2016 until 10/05/2016 for sepsis secondary to UTI and is being brought to the emergency department due to altered mental status since earlier today. She is unable to provide further history, but her daughter and son stated that she have been interacting with the staff and family members through the week, per her baseline, until the changes in mentation were noticed today. There has not been any recorded fever to their knowledge. She has a chronic indwelling Foley catheter and a chronic stage IV decubiti ulcer.  Assessment & Plan:   Principal Problem:   Altered mental status Active Problems:   Chronic kidney disease, stage III (moderate) (HCC)   Type II diabetes mellitus with renal manifestations (HCC)   Stage IV pressure ulcer of sacral region (HCC)   GERD (gastroesophageal reflux disease)   Anemia, chronic disease   Chronic CHF (congestive heart failure) (HCC)   Hypernatremia   UTI (urinary tract infection)   Sepsis secondary to UTI (HCC)  Principal Problem:   Altered mental status, toxic metabolic encephalopathy Likely secondary UTI and electrolyte imbalance. Pt continued on empiric rocephin Repeat bmet in AM, correct electrolytes as needed  Active Problems:   UTI (urinary tract infection), likely fungal Recent urine culture and sensitivity showed Proteus that was sensitive to all antibiotics except for nitrofurantoin. She received aztreonam in the emergency department. Patient currently receiving rocephin with much clinical improvement Urine culture thus far demonstrating >100,000  yeast. Will add IV diflucan    Anemia, chronic disease The patient is a Jehov[employer]'s [employer] and POAs have declined blood product administration. They mentioned that they do not agree with her mother's decision in regards to this, but they are following her wishes.  Hgb thus far remains stable     Hypernatremia started 0.45% sodium chloride infusion. Sodium improving with IVF hydration    Chronic kidney disease, stage III (moderate) Monitor renal function and electrolytes. Renal function improved. Repeat BMET in AM    Type II diabetes mellitus with renal manifestations (HCC) Continue on SSI coverage q4hrs Stable thus far    Stage IV pressure ulcer of sacral region [facility]) Continue local care. WOC consulted, appreciate input    GERD (gastroesophageal reflux disease)  Continue Protonix 40 mg IVPB every 24 hours in the presence of unclear etiology anemia. Currently stable    Chronic diastolic CHF (congestive heart failure) (HCC) Appears stable at this time  Afib RVR Presented in afib RVR Started metoprolol for rate control Continue IVF hydration as tolerated Rate now controlled  Hx Dysphagia Pt is at higher risk for aspiration Prior d/c summary reviewed. Pt is to have Dysphagia 2 diet with thin liquids when she has dentures. Dysphagia 1 diet with thin liquids when she does not have dentures. Family insists on feeding pt against recommended diet. Family aware of risk and agrees to assume responsibility for decompensation and even death if patient aspirates  DVT prophylaxis: SCD's Code Status: DNR Family Communication: Pt in room, family at bedside Disposition Plan: Uncertain at this time  Consultants:     Procedures:     Antimicrobials: Anti-infectives    Start     Dose/Rate Route Frequency Ordered Stop   11/09/16 1115  cefTRIAXone (ROCEPHIN) 1 g in dextrose 5 % 50 mL IVPB     1 g 100 mL/hr over 30 Minutes Intravenous Every 24 hours 11/09/16 1101     11/08/16  2245  aztreonam (AZACTAM) 1 g in dextrose 5 % 50 mL IVPB     1 g 100 mL/hr over 30 Minutes Intravenous  Once 11/08/16 2244 11/08/16 2343      Subjective: Reports feeling better today  Objective: Vitals:   11/09/16 2137 11/10/16 0445 11/10/16 0940 11/10/16 1348  BP: 96/63 (!) 105/41 112/74 97/74  Pulse: (!) 121 (!) 101 (!) 101 (!) 104  Resp: Temp: 99.1 F (37.3 C) 97.8 F (36.6 C) 98.4 F (36.9 C) 99.1 F (37.3 C)  TempSrc: Axillary Oral Oral Oral  SpO2: 97% 99% 100% 100%  Weight: 47 kg (103 lb 9.6 oz)     Height:  (1.626 m)       Intake/Output Summary (Last 24 hours) at 11/10/16 1441 Last data filed at 11/10/16 0609  Gross per 24 hour  Intake          1401.67 ml  Output              530 ml  Net           871.67 ml   Filed Weights   11/09/16 2137  Weight: 47 kg (103 lb 9.6 oz)    Examination: General exam: Awake, laying in bed, in nad Respiratory system: Normal respiratory effort, no wheezing Cardiovascular system: regular rate, s1, s2 Gastrointestinal system: Soft, nondistended, positive BS Central nervous system: CN2-12 grossly intact, strength intact Extremities: Perfused, no clubbing Skin: Normal skin turgor, no notable skin lesions seen Psychiatry: Pleasantly confused, mood appears stable   Data Reviewed: I have personally reviewed following labs and imaging studies  CBC:  Recent Labs Lab 11/08/16 2136 11/08/16 2250 11/08/16 2303 11/09/16 1117 11/10/16 0446  WBC 14.9* 6.8  --  8.8 13.6*  NEUTROABS 11.8*  --   --  7.0 12.0*  HGB 3.0* 5.2* 5.1* 8.1* 7.9*  HCT 9.2* 16.1* 15.0* 25.1* 24.1*  MCV 82.9 82.6  --  81.0 81.4  PLT 593* 264  --  407* 410*   Basic Metabolic Panel:  Recent Labs Lab 11/08/16 2136 11/08/16 2303 11/09/16 1117 11/10/16 0446  NA 149* 153* 144 141  K 4.1 2.3* 3.6 3.7  CL 113* 123* 112* 111  CO2 29  --  25 21*  GLUCOSE 175* 94 125* 158*  BUN 32* 16 25* 25*  CREATININE 1.09* 0.40* 0.83 0.89  CALCIUM  8.9  --  8.3* 8.0*  MG  --   --  2.0  --   PHOS  --   --  2.7  --    GFR: Estimated Creatinine Clearance: 30.5 mL/min (by C-G formula based on SCr of 0.89 mg/dL). Liver Function Tests:  Recent Labs Lab 11/08/16 2136 11/10/16 0446  AST 17 14*  ALT 8* 7*  ALKPHOS 69 56  BILITOT 0.2* 0.6  PROT 6.2* 5.1*  ALBUMIN 2.2* 2.0*   No results for input(s): LIPASE, AMYLASE in the last 168 hours.  Recent Labs Lab 11/08/16 2136  AMMONIA 17   Coagulation Profile: No results for input(s): INR, PROTIME in the last 168 hours. Cardiac Enzymes: No results for input(s): CKTOTAL, CKMB, CKMBINDEX, TROPONINI in the last 168 hours. BNP (last 3 results) No results for input(s): PROBNP in the last 8760 hours. HbA1C: No results for input(s): HGBA1C  in the last 72 hours. CBG:  Recent Labs Lab 11/09/16 2016 11/09/16 2345 11/10/16 0434 11/10/16 0820 11/10/16 1204  GLUCAP 121* 88 165* 125* 101*   Lipid Profile: No results for input(s): CHOL, HDL, LDLCALC, TRIG, CHOLHDL, LDLDIRECT in the last 72 hours. Thyroid Function Tests: No results for input(s): TSH, T4TOTAL, FREET4, T3FREE, THYROIDAB in the last 72 hours. Anemia Panel: No results for input(s): VITAMINB12, FOLATE, FERRITIN, TIBC, IRON, RETICCTPCT in the last 72 hours. Sepsis Labs:  Recent Labs Lab 11/08/16 2155  LATICACIDVEN 1.72    Recent Results (from the past 240 hour(s))  Urine culture     Status: Abnormal   Collection Time: 11/08/16  9:36 PM  Result Value Ref Range Status   Specimen Description URINE, CATHETERIZED  Final   Special Requests NONE  Final   Culture >=100,000 COLONIES/mL YEAST (A)  Final   Report Status 11/10/2016 FINAL  Final     Radiology Studies: Dg Chest 1 View  Result Date: 11/08/2016 CLINICAL DATA:  Altered mental status.  Decubitus ulcer. EXAM: CHEST 1 VIEW COMPARISON:  09/30/2016 and 05/15/2016 FINDINGS: Lungs are adequately inflated as patient is rotated to the right. There is mild opacification  with coarse interstitial changes over the apices which is stable. Remainder the lungs are clear. Mild stable cardiomegaly. Remainder the exam is unchanged. IMPRESSION: Chronic changes over the lung apices.  No acute findings. Electronically Signed   By: Elberta Fortis M.D.   On: 11/08/2016 21:30   Ct Head Wo Contrast  Result Date: 11/08/2016 CLINICAL DATA:  Unexplained altered level of consciousness. Patient is responsive to verbal stimuli and afebrile. EXAM: CT HEAD WITHOUT CONTRAST TECHNIQUE: Contiguous axial images were obtained from the base of the skull through the vertex without intravenous contrast. COMPARISON:  03/15/2016 FINDINGS: Brain: Examination is limited due to motion artifact. There is diffuse cerebral atrophy. Ventricular dilatation consistent with central atrophy. Low-attenuation changes in the deep white matter consistent with small vessel ischemia. No mass-effect or midline shift. No abnormal extra-axial fluid collections. The gray-white matter junctions are distinct. The basal cisterns are not effaced. No acute intracranial hemorrhage. Vascular: Internal carotid vascular calcifications. Skull: No depressed skull fractures. Sinuses/Orbits: Paranasal sinuses and mastoid air cells are clear. Other: None. IMPRESSION: No acute intracranial abnormalities. Chronic atrophy and small vessel ischemic changes. Electronically Signed   By: Burman Nieves M.D.   On: 11/08/2016 21:57    Scheduled Meds: . feeding supplement (ENSURE ENLIVE)  237 mL Oral BID BM  . insulin aspart  0-9 Units Subcutaneous Q4H   Continuous Infusions: . sodium chloride    . cefTRIAXone (ROCEPHIN)  IV Stopped (11/10/16 1149)  . dextrose 5 % and 0.45% NaCl 100 mL/hr at 11/10/16 0126     LOS: 2 days   Djimon Lundstrom, Scheryl Marten, MD [facility] Pager [phone]  If 7PM-7AM, please contact night-coverage [email or web address] Password [email or web address] 11/10/2016, 2:41 PM

## 2016-11-10 NOTE — Progress Notes (Addendum)
Initial Nutrition Assessment  DOCUMENTATION CODES:   Underweight, Severe malnutrition in context of acute illness/injury  INTERVENTION:    Monitor for diet advancement/toleration  Ensure Enlive po BID, each supplement provides 350 kcal and 20 grams of protein  Magic cup TID with meals, each supplement provides 290 kcal and 9 grams of protein  NUTRITION DIAGNOSIS:   Malnutrition (Severe) related to acute illness (recurrent UTI/dysphagia) as evidenced by energy intake < or equal to 50% for > or equal to 1 month, moderate depletion of body fat, severe depletion of muscle mass, moderate to severe fluid accumulation, 13% weight loss in 2 months.  GOAL:   Patient will meet greater than or equal to 90% of their needs  MONITOR:   PO intake, Supplement acceptance, Weight trends, Labs, Diet advancement  REASON FOR ASSESSMENT:   Malnutrition Screening Tool    ASSESSMENT:   Pt with PMH significant for osteoarthritis, DDD, spinal stenosis, DM, CKD III, hypercholesterolemia, HTN, IBS, chronic indwelling catheter, chronic stg IV decubiti ulcer, and dysphagia. Recently admitted 8/22-8/27 for sepsis secondary to UTI. Presents this admission with AMS. Admitted for UTI and toxic metabolic encephalopathy.    Spoke with daughter at bedside. Daughter reports pt's appetite started to decline a week ago after starting her recommended pureed diet. States pt had MBS, and was put on a thickened liquid/pureed food diet. Pt began refusing all food almost immediatly. Family would like pt to have regular consistency food despite recommendation. Family aware of the risk but wants to proceed with "comfort" feedings. SLP to see pt.   Records indicate pt has lost 13% of body wt in 2 months. This percentage in this time frame is significant. Given pt's significant wt loss, suspect pt has had poor appetite for > 1 month.   Nutrition-Focused physical exam completed. Findings are moderate fat depletion, severe  muscle depletion, and moderate edema in bilateral hands.   Medications reviewed and include: SSI, IV abx, NaCl with D5 @ 100 ml/hr Labs reviewed: CBG 125-158 calcium ionized 0.81 (L)  Diet Order:  DIET DYS 2 Room service appropriate? Yes; Fluid consistency: Thin  Skin:   (stg IV sacrum, stg IV left heel, stg III right heel)  Last BM:  11/09/16  Height:   Ht Readings from Last 1 Encounters:  11/09/16  (1.626 m)    Weight:   Wt Readings from Last 1 Encounters:  11/09/16 103 lb 9.6 oz (47 kg)    Ideal Body Weight:  54.5 kg  BMI:  Body mass index is 17.78 kg/m.  Estimated Nutritional Needs:   Kcal:  1500-1700 (32-36 Kcal/kg)  Protein:  85-95 grams (1.8-2 g/kg)  Fluid:  >1.5 L/day  EDUCATION NEEDS:   Education needs addressed  Vanessa Kick RD, LDN Clinical Nutrition Pager # [phone]

## 2016-11-11 DIAGNOSIS — I509 Heart failure, unspecified: Secondary | ICD-10-CM

## 2016-11-11 DIAGNOSIS — R4182 Altered mental status, unspecified: Secondary | ICD-10-CM

## 2016-11-11 LAB — COMPREHENSIVE METABOLIC PANEL
ALK PHOS: 53 U/L (ref 38–126)
ALT: 7 U/L — ABNORMAL LOW (ref 14–54)
AST: 15 U/L (ref 15–41)
Albumin: 1.8 g/dL — ABNORMAL LOW (ref 3.5–5.0)
Anion gap: 7 (ref 5–15)
BILIRUBIN TOTAL: 0.2 mg/dL — AB (ref 0.3–1.2)
BUN: 26 mg/dL — ABNORMAL HIGH (ref 6–20)
CALCIUM: 7.8 mg/dL — AB (ref 8.9–10.3)
CO2: 21 mmol/L — ABNORMAL LOW (ref 22–32)
Chloride: 111 mmol/L (ref 101–111)
Creatinine, Ser: 0.91 mg/dL (ref 0.44–1.00)
GFR, EST NON AFRICAN AMERICAN: 54 mL/min — AB (ref >60)
Glucose, Bld: 103 mg/dL — ABNORMAL HIGH (ref 65–99)
Potassium: 3.3 mmol/L — ABNORMAL LOW (ref 3.5–5.1)
SODIUM: 139 mmol/L (ref 135–145)
TOTAL PROTEIN: 5 g/dL — AB (ref 6.5–8.1)

## 2016-11-11 LAB — CBC WITH DIFFERENTIAL/PLATELET
BASOS PCT: 0 %
Basophils Absolute: 0 10*3/uL (ref 0.0–0.1)
EOS ABS: 0.4 10*3/uL (ref 0.0–0.7)
Eosinophils Relative: 5 %
HEMATOCRIT: 22.2 % — AB (ref 36.0–46.0)
HEMOGLOBIN: 7.5 g/dL — AB (ref 12.0–15.0)
LYMPHS PCT: 14 %
Lymphs Abs: 1 10*3/uL (ref 0.7–4.0)
MCH: 27 pg (ref 26.0–34.0)
MCHC: 33.8 g/dL (ref 30.0–36.0)
MCV: 79.9 fL (ref 78.0–100.0)
Monocytes Absolute: 0.9 10*3/uL (ref 0.1–1.0)
Monocytes Relative: 12 %
NEUTROS ABS: 5.1 10*3/uL (ref 1.7–7.7)
Neutrophils Relative %: 69 %
Platelets: 356 10*3/uL (ref 150–400)
RBC: 2.78 MIL/uL — ABNORMAL LOW (ref 3.87–5.11)
RDW: 23.3 % — ABNORMAL HIGH (ref 11.5–15.5)
WBC: 7.4 10*3/uL (ref 4.0–10.5)

## 2016-11-11 LAB — GLUCOSE, CAPILLARY
GLUCOSE-CAPILLARY: 106 mg/dL — AB (ref 65–99)
GLUCOSE-CAPILLARY: 182 mg/dL — AB (ref 65–99)
Glucose-Capillary: 129 mg/dL — ABNORMAL HIGH (ref 65–99)
Glucose-Capillary: 123 mg/dL — ABNORMAL HIGH (ref 65–99)
Glucose-Capillary: 140 mg/dL — ABNORMAL HIGH (ref 65–99)
Glucose-Capillary: 119 mg/dL — ABNORMAL HIGH (ref 65–99)

## 2016-11-11 MED ORDER — TRAMADOL HCL 50 MG PO TABS
50.0000 mg | ORAL_TABLET | Freq: Four times a day (QID) | ORAL | Status: DC | PRN
  Administered 2016-11-11 – 2016-11-12 (×3): 50 mg via ORAL
  Filled 2016-11-11 (×3): qty 1

## 2016-11-11 MED ORDER — ACETAMINOPHEN 325 MG PO TABS
650.0000 mg | ORAL_TABLET | ORAL | Status: DC | PRN
  Administered 2016-11-11 – 2016-11-12 (×2): 650 mg via ORAL
  Filled 2016-11-11 (×2): qty 2

## 2016-11-11 MED ORDER — POTASSIUM CHLORIDE CRYS ER 20 MEQ PO TBCR
40.0000 meq | EXTENDED_RELEASE_TABLET | Freq: Once | ORAL | Status: AC
  Administered 2016-11-11: 40 meq via ORAL
  Filled 2016-11-11: qty 2

## 2016-11-11 MED ORDER — FLUCONAZOLE 100 MG PO TABS
200.0000 mg | ORAL_TABLET | Freq: Every day | ORAL | Status: DC
  Administered 2016-11-11 – 2016-11-12 (×2): 200 mg via ORAL
  Filled 2016-11-11 (×2): qty 2

## 2016-11-11 NOTE — Progress Notes (Signed)
  Speech Language Pathology Treatment: Dysphagia  Patient Details Name: Elizabeth Keller MRN: [id number] DOB: 21-Feb-1923 Today's Date: 11/11/2016 Time: 1500-1520 SLP Time Calculation (min) (ACUTE ONLY): 20 min  Assessment / Plan / Recommendation Clinical Impression  Pt seen at bedside to follow up after outpatient MBS (11/04/16) and BSE (11/10/16). Per son and RN, pt is eating more today. No po trials given at this time, as pt is reporting 10/10 back pain and did not want to be repositioned, however, no overt s/s aspiration reported. SLP provided safe swallow precautions, posting them at [name]. SLP reviewed safe swallow precautions with son, in addition to providing information on naturally thickened liquids and contact information for commercial thickeners. Son reports plan for DC to nursing facility tomorrow. Recommend posted precautions be taken with her at DC.    HPI HPI: 83 yo female with hx of dysphagia, CHF, GERD, DM, CKD, FTT, anxiety, cognitive impairment, last admitted 8/22-27/18 for sepsis secondary to UTI; brought to ED with MS changes.  Recent OP MBS (11/04/16) revealed moderate oral and mild pharyngeal dysphagia with impaired timing of swallow leading to silent aspiration of thin liquids.  Recs were made for dysphagia 1, nectar-thick liquids. Two clinical swallowing evaluations were completed earlier this year (08/18 and 02/18), both recommending soft or chopped foods but with thin liquids. Pt's family states that pt refuses purees; they request a dysphagia 2 diet.       SLP Plan  Continue with current plan of care       Recommendations  Diet recommendations: Dysphagia 2 (fine chop);Nectar-thick liquid Liquids provided via: Cup;Straw Medication Administration: Crushed with puree Supervision: Staff to assist with self feeding;Full supervision/cueing for compensatory strategies Compensations: Minimize environmental distractions;Slow rate;Small sips/bites;Multiple dry swallows after each  bite/sip Postural Changes and/or Swallow Maneuvers: Seated upright 90 degrees;Upright 30-60 min after meal                Oral Care Recommendations: Oral care QID Follow up Recommendations: Skilled Nursing facility;24 hour supervision/assistance SLP Visit Diagnosis: Dysphagia, oropharyngeal phase (R13.12) Plan: Continue with current plan of care       GO               Celia B. Murvin Natal [facility], CCC-SLP Speech Language Pathologist [phone]  Leigh Aurora 11/11/2016, 3:22 PM

## 2016-11-11 NOTE — NC FL2 (Signed)
[place] MEDICAID FL2 LEVEL OF CARE SCREENING TOOL     IDENTIFICATION  Patient Name: Elizabeth Keller Birthdate: 09/15/1923 Sex: female Admission Date (Current Location): 11/08/2016  [facility] and [place] Number:  Producer, television/film/video and Address:  [place],  [address], [place] [address]      Provider Number: [id number]  Attending Physician Name and Address:  Kathlen Mody, MD  Relative Name and Phone Number:       Current Level of Care: Hospital Recommended Level of Care: Skilled Nursing Facility Prior Approval Number:    Date Approved/Denied:   PASRR Number: [id number]  Discharge Plan: SNF    Current Diagnoses: Patient Active Problem List   Diagnosis Date Noted  . Sepsis secondary to UTI (HCC) 11/09/2016  . Altered mental status 11/08/2016  . Hypernatremia 11/08/2016  . UTI (urinary tract infection) 11/08/2016  . Chronic CHF (congestive heart failure) (HCC) 10/15/2016  . Spinal stenosis, site unspecified 10/06/2016  . Chronic pain syndrome 10/06/2016  . ARF (acute renal failure) (HCC) 09/30/2016  . Osteoarthritis of multiple joints 09/03/2016  . Right wrist fracture, sequela 09/03/2016  . Swelling of left hand 08/31/2016  . Cognitive impairment 08/14/2016  . Anemia, chronic disease 08/14/2016  . GERD (gastroesophageal reflux disease) 08/13/2016  . Stage IV pressure ulcer of sacral region (HCC) 07/08/2016  . Decubitus ulcer of heel, bilateral, unstageable (HCC) 07/08/2016  . Dyslipidemia associated with type 2 diabetes mellitus (HCC) 07/08/2016  . Failure to thrive in adult 07/08/2016  . Weight loss, non-intentional 07/08/2016  . Anxiety disorder 07/08/2016  . Severe protein-calorie malnutrition (HCC) 06/30/2016  . Sepsis (HCC) 06/29/2016  . Severe sepsis (HCC) 05/15/2016  . Type II diabetes mellitus with renal manifestations (HCC) 04/04/2016  . Chronic kidney disease, stage III (moderate) (HCC) 03/14/2016    Orientation RESPIRATION  BLADDER Height & Weight     Self, Place  Normal Indwelling catheter Weight: 103 lb 9.6 oz (47 kg) (Per daughter pt was 97lb  2 wks ago) Height:   (162.6 cm)  BEHAVIORAL SYMPTOMS/MOOD NEUROLOGICAL BOWEL NUTRITION STATUS      Continent Diet (dysphasia II, nectar thick fluid consistency)  AMBULATORY STATUS COMMUNICATION OF NEEDS Skin   Extensive Assist Verbally PU Stage and Appropriate Care  stage IV pressure injury, sacrum, foam dressings, moist to dry chages 2xday  Stage IV pressure injury, heels (right and left), foam dressing changes, moist to dry                      Personal Care Assistance Level of Assistance  Bathing, Feeding, Dressing Bathing Assistance: Limited assistance Feeding assistance: Independent Dressing Assistance: Limited assistance     Functional Limitations Info  Sight, Hearing, Speech Sight Info: Adequate Hearing Info: Adequate Speech Info: Adequate    SPECIAL CARE FACTORS FREQUENCY  PT (By licensed PT), OT (By licensed OT), Speech therapy     PT Frequency: 5x OT Frequency: 5x     Speech Therapy Frequency: 2x      Contractures Contractures Info: Not present    Additional Factors Info  Code Status, Allergies Code Status Info: DNR Allergies Info: Dicyclomine, Gabapentin, Metformin And Related, Lyrica Pregabalin, Topiramate           Current Medications (11/11/2016):  This is the current hospital active medication list Current Facility-Administered Medications  Medication Dose Route Frequency Provider Last Rate Last Dose  . acetaminophen (TYLENOL) suppository 650 mg  650 mg Rectal Q4H PRN Hillary Bow, DO  650 mg at 11/11/16 0351  . cefTRIAXone (ROCEPHIN) 1 g in dextrose 5 % 50 mL IVPB  1 g Intravenous Q24H Bobette Mo, MD   Stopped at 11/11/16 1120  . Chlorhexidine Gluconate Cloth 2 % PADS 6 each  6 each Topical Q0600 Jerald Kief, MD   6 each at 11/11/16 0600  . dextrose 5 %-0.45 % sodium chloride infusion    Intravenous Continuous Audrea Muscat T, NP 100 mL/hr at 11/10/16 0126    . feeding supplement (ENSURE ENLIVE) (ENSURE ENLIVE) liquid 237 mL  237 mL Oral BID BM Jerald Kief, MD   237 mL at 11/11/16 1049  . fluconazole (DIFLUCAN) tablet 200 mg  200 mg Oral Daily Len Childs T, RPH   200 mg at 11/11/16 1054  . insulin aspart (novoLOG) injection 0-9 Units  0-9 Units Subcutaneous Q4H Jerald Kief, MD   1 Units at 11/11/16 [phone]  . mupirocin ointment (BACTROBAN) 2 % 1 application  1 application Nasal BID Jerald Kief, MD   1 application at 11/11/16 1052  . ondansetron (ZOFRAN) tablet 4 mg  4 mg Oral Q6H PRN Bobette Mo, MD       Or  . ondansetron [facility]) injection 4 mg  4 mg Intravenous Q6H PRN Bobette Mo, MD      . RESOURCE [facility] CLEAR   Oral PRN Jerald Kief, MD         Discharge Medications: Please see discharge summary for a list of discharge medications.  Relevant Imaging Results:  Relevant Lab Results:   Additional Information SS # [id number].   Nelwyn Salisbury, LCSW

## 2016-11-11 NOTE — Progress Notes (Signed)
PROGRESS NOTE    Elizabeth Keller  WUJ:[id number] DOB: Feb 23, 1923 DOA: 11/08/2016 PCP: Sandford Craze, NP    Brief Narrative: 83 y.o.femalewith medical history significant of anxiety, osteoarthritis, asthma, degenerative disc disease, spinal stenosis, type 2 diabetes, chronic kidney disease, GERD, hypercholesterolemia, hypertension, IBS, sarcoidosis, dysphagia who was last admitted from 09/30/2016 until 10/05/2016 for sepsis secondary to UTI and is being brought to the emergency department due to altered mental status since earlier today. She is unable to provide further history, but her daughter and son stated that she have been interacting with the staff and family members through the week, per her baseline, until the changes in mentation were noticed today. There has not been any recorded fever to their knowledge. She has a chronic indwelling Foley catheter and a chronic stage IV decubiti ulcer. Assessment & Plan:   Principal Problem:   Altered mental status Active Problems:   Chronic kidney disease, stage III (moderate) (HCC)   Type II diabetes mellitus with renal manifestations (HCC)   Stage IV pressure ulcer of sacral region (HCC)   GERD (gastroesophageal reflux disease)   Anemia, chronic disease   Chronic CHF (congestive heart failure) (HCC)   Hypernatremia   UTI (urinary tract infection)   Sepsis secondary to UTI (HCC)  Acute metabolic encephalopathy:  Secondary to UTi and electrolyte imbalance.  Resume diflucan. Replete electrolytes as needed.    Anemia of chronic disease:  H&H stable.   Stage 3 CKD: Creatinine at baseline.    Type 2 DM;  CBG (last 3)   Recent Labs  11/11/16 0754 11/11/16 1157 11/11/16 1702  GLUCAP 123* 140* 119*    Resume SSI.    Stage 4 Decubitus ulcer:  Wound care consulted and recommendations given.   afib with RVR:  Rate controlled.  Resume metoprolol.   Chronic diastolic heart failure:  Appears stable. Not fluid overloaded.      H/o dysphagia:  slp eval recommended dysphagia 2 diet.      DVT prophylaxis: SCD'S Code Status: DNR.  Family Communication: SON AT BEDSIDE.  Disposition Plan: home in am.    Consultants:   Wound care.   Procedures: none.   Antimicrobials: diflucan.  Subjective: No new complaints.  Objective: Vitals:   11/10/16 2031 11/11/16 0427 11/11/16 1248 11/11/16 1334  BP: (!) 92/45 127/63 (!) 134/57   Pulse: 88 (!) 104 99   Resp: Temp: 97.9 F (36.6 C) 98.1 F (36.7 C) 98.9 F (37.2 C)   TempSrc: Oral Oral Oral   SpO2: 98% 99% 98% 99%  Weight:      Height:        Intake/Output Summary (Last 24 hours) at 11/11/16 1819 Last data filed at 11/11/16 1325  Gross per 24 hour  Intake          3326.67 ml  Output                0 ml  Net          3326.67 ml   Filed Weights   11/09/16 2137  Weight: 47 kg (103 lb 9.6 oz)    Examination:  General exam: Appears calm and comfortable  Respiratory system: Clear to auscultation. Respiratory effort normal. Cardiovascular system: S1 & S2 heard, RRR. No JVD, murmurs, rubs, gallops or clicks. No pedal edema. Gastrointestinal system: Abdomen is nondistended, soft and nontender. No organomegaly or masses felt. Normal bowel sounds heard. Central nervous system: Alert and oriented. No focal neurological deficits. Extremities:  Symmetric 5 x 5 power. Skin: stage 4 decubitus ulcer.  Psychiatry: Judgement and insight appear normal. Mood & affect appropriate.     Data Reviewed: I have personally reviewed following labs and imaging studies  CBC:  Recent Labs Lab 11/08/16 2136 11/08/16 2250 11/08/16 2303 11/09/16 1117 11/10/16 0446 11/11/16 0431  WBC 14.9* 6.8  --  8.8 13.6* 7.4  NEUTROABS 11.8*  --   --  7.0 12.0* 5.1  HGB 3.0* 5.2* 5.1* 8.1* 7.9* 7.5*  HCT 9.2* 16.1* 15.0* 25.1* 24.1* 22.2*  MCV 82.9 82.6  --  81.0 81.4 79.9  PLT 593* 264  --  407* 410* 356   Basic Metabolic Panel:  Recent Labs Lab  11/08/16 2136 11/08/16 2303 11/09/16 1117 11/10/16 0446 11/11/16 0431  NA 149* 153* 144 141 139  K 4.1 2.3* 3.6 3.7 3.3*  CL 113* 123* 112* 111 111  CO2 29  --  25 21* 21*  GLUCOSE 175* 94 125* 158* 103*  BUN 32* 16 25* 25* 26*  CREATININE 1.09* 0.40* 0.83 0.89 0.91  CALCIUM 8.9  --  8.3* 8.0* 7.8*  MG  --   --  2.0  --   --   PHOS  --   --  2.7  --   --    GFR: Estimated Creatinine Clearance: 29.9 mL/min (by C-G formula based on SCr of 0.91 mg/dL). Liver Function Tests:  Recent Labs Lab 11/08/16 2136 11/10/16 0446 11/11/16 0431  AST 17 14* 15  ALT 8* 7* 7*  ALKPHOS 69 56 53  BILITOT 0.2* 0.6 0.2*  PROT 6.2* 5.1* 5.0*  ALBUMIN 2.2* 2.0* 1.8*   No results for input(s): LIPASE, AMYLASE in the last 168 hours.  Recent Labs Lab 11/08/16 2136  AMMONIA 17   Coagulation Profile: No results for input(s): INR, PROTIME in the last 168 hours. Cardiac Enzymes: No results for input(s): CKTOTAL, CKMB, CKMBINDEX, TROPONINI in the last 168 hours. BNP (last 3 results) No results for input(s): PROBNP in the last 8760 hours. HbA1C: No results for input(s): HGBA1C in the last 72 hours. CBG:  Recent Labs Lab 11/11/16 0122 11/11/16 0415 11/11/16 0754 11/11/16 1157 11/11/16 1702  GLUCAP 129* 106* 123* 140* 119*   Lipid Profile: No results for input(s): CHOL, HDL, LDLCALC, TRIG, CHOLHDL, LDLDIRECT in the last 72 hours. Thyroid Function Tests: No results for input(s): TSH, T4TOTAL, FREET4, T3FREE, THYROIDAB in the last 72 hours. Anemia Panel: No results for input(s): VITAMINB12, FOLATE, FERRITIN, TIBC, IRON, RETICCTPCT in the last 72 hours. Sepsis Labs:  Recent Labs Lab 11/08/16 2155  LATICACIDVEN 1.72    Recent Results (from the past 240 hour(s))  Urine culture     Status: Abnormal   Collection Time: 11/08/16  9:36 PM  Result Value Ref Range Status   Specimen Description URINE, CATHETERIZED  Final   Special Requests NONE  Final   Culture >=100,000 COLONIES/mL  YEAST (A)  Final   Report Status 11/10/2016 FINAL  Final  MRSA PCR Screening     Status: Abnormal   Collection Time: 11/10/16  9:20 AM  Result Value Ref Range Status   MRSA by PCR POSITIVE (A) NEGATIVE Final    Comment:        The GeneXpert MRSA Assay (FDA approved for NASAL specimens only), is one component of a comprehensive MRSA colonization surveillance program. It is not intended to diagnose MRSA infection nor to guide or monitor treatment for MRSA infections. RESULT CALLED TO, READ BACK BY AND VERIFIED  WITH: CORCORAN,R @ 1733 ON [id number] BY POTEAT,S          Radiology Studies: No results found.      Scheduled Meds: . Chlorhexidine Gluconate Cloth  6 each Topical Q0600  . feeding supplement (ENSURE ENLIVE)  237 mL Oral BID BM  . fluconazole  200 mg Oral Daily  . insulin aspart  0-9 Units Subcutaneous Q4H  . mupirocin ointment  1 application Nasal BID   Continuous Infusions: . cefTRIAXone (ROCEPHIN)  IV Stopped (11/11/16 1120)  . dextrose 5 % and 0.45% NaCl 100 mL/hr at 11/10/16 0126     LOS: 3 days    Time spent: 35 minutes.     Kathlen Mody, MD [facility] Pager [phone]  If 7PM-7AM, please contact night-coverage [email or web address] Password [email or web address] 11/11/2016, 6:19 PM

## 2016-11-11 NOTE — Care Management Important Message (Signed)
Important Message  Patient Details IM Letter given to Cookie/Case Manager to present to Patient Name: Margerite Impastato MRN: [id number] Date of Birth: [birth year]   Medicare Important Message Given:  Yes    Caren Macadam 11/11/2016, 12:10 PM

## 2016-11-11 NOTE — Progress Notes (Signed)
CSW following as pt is admitted from facility. Pt returning to [facility] at DC.  CSW spoke with facility today to confirm plan. Also spoke with pt's daughter Elizabeth Keller. Agreeable to plan. Pt will need [facility] transportation at DC which CSW will arrange. Daughter Elizabeth Keller concerned about "mix up in the ED about who the Healthcare Power of [name] is." CSW reviewed paperwork on file and most recent healthcare power of attorney paperwork names daughter Elizabeth Keller and son Elizabeth Keller as Building services engineer. Informed daughter.   Will assist with transition back to SNF at DC.   Ilean Skill, MSW, LCSW Clinical Social Work 11/11/2016 [phone]

## 2016-11-11 NOTE — Progress Notes (Signed)
PHARMACIST - PHYSICIAN COMMUNICATION DR:   Blake Divine CONCERNING: Antibiotic IV to Oral Route Change Policy  RECOMMENDATION: This patient is receiving fluconazole by the intravenous route.  Based on criteria approved by the Pharmacy and Therapeutics Committee, the antibiotic(s) is/are being converted to the equivalent oral dose form(s). Discussed with pt's dtr - will give crushed with a little bit of applesauce   DESCRIPTION: These criteria include:  Patient being treated for a respiratory tract infection, urinary tract infection, cellulitis or clostridium difficile associated diarrhea if on metronidazole  The patient is not neutropenic and does not exhibit a GI malabsorption state  The patient is eating (either orally or via tube) and/or has been taking other orally administered medications for a least 24 hours  The patient is improving clinically and has a Tmax < 100.5  If you have questions about this conversion, please contact the Pharmacy Department    [phone] )  [facility]   [phone] )  [facility]   [phone] )  [facility]   [phone] )  [facility]   [phone] )  [facility]  Herby Abraham, [place].D. [phone] 11/11/2016 9:05 AM

## 2016-11-12 DIAGNOSIS — N183 Chronic kidney disease, stage 3 (moderate): Secondary | ICD-10-CM

## 2016-11-12 DIAGNOSIS — E860 Dehydration: Secondary | ICD-10-CM

## 2016-11-12 DIAGNOSIS — G9341 Metabolic encephalopathy: Secondary | ICD-10-CM

## 2016-11-12 DIAGNOSIS — K219 Gastro-esophageal reflux disease without esophagitis: Secondary | ICD-10-CM

## 2016-11-12 DIAGNOSIS — E1122 Type 2 diabetes mellitus with diabetic chronic kidney disease: Secondary | ICD-10-CM

## 2016-11-12 DIAGNOSIS — N3000 Acute cystitis without hematuria: Secondary | ICD-10-CM

## 2016-11-12 DIAGNOSIS — L89154 Pressure ulcer of sacral region, stage 4: Secondary | ICD-10-CM

## 2016-11-12 LAB — CBC WITH DIFFERENTIAL/PLATELET
BASOS PCT: 0 %
Basophils Absolute: 0 10*3/uL (ref 0.0–0.1)
EOS PCT: 7 %
Eosinophils Absolute: 0.4 10*3/uL (ref 0.0–0.7)
HEMATOCRIT: 22.3 % — AB (ref 36.0–46.0)
HEMOGLOBIN: 7.2 g/dL — AB (ref 12.0–15.0)
LYMPHS PCT: 16 %
Lymphs Abs: 0.9 10*3/uL (ref 0.7–4.0)
MCH: 25.8 pg — ABNORMAL LOW (ref 26.0–34.0)
MCHC: 32.3 g/dL (ref 30.0–36.0)
MCV: 79.9 fL (ref 78.0–100.0)
MONOS PCT: 12 %
Monocytes Absolute: 0.7 10*3/uL (ref 0.1–1.0)
NEUTROS ABS: 3.9 10*3/uL (ref 1.7–7.7)
Neutrophils Relative %: 65 %
Platelets: 342 10*3/uL (ref 150–400)
RBC: 2.79 MIL/uL — AB (ref 3.87–5.11)
RDW: 23 % — ABNORMAL HIGH (ref 11.5–15.5)
WBC: 5.9 10*3/uL (ref 4.0–10.5)

## 2016-11-12 LAB — GLUCOSE, CAPILLARY
GLUCOSE-CAPILLARY: 114 mg/dL — AB (ref 65–99)
GLUCOSE-CAPILLARY: 130 mg/dL — AB (ref 65–99)
GLUCOSE-CAPILLARY: 106 mg/dL — AB (ref 65–99)
GLUCOSE-CAPILLARY: 138 mg/dL — AB (ref 65–99)

## 2016-11-12 MED ORDER — FLUCONAZOLE 200 MG PO TABS: 200.0000 mg | ORAL_TABLET | Freq: Every day | ORAL | 0 refills | Status: AC

## 2016-11-12 MED ORDER — FUROSEMIDE 20 MG PO TABS: 20.0000 mg | ORAL_TABLET | Freq: Every day | ORAL | Status: AC | PRN

## 2016-11-12 MED ORDER — CLONAZEPAM 0.5 MG PO TABS: 0.2500 mg | ORAL_TABLET | Freq: Two times a day (BID) | ORAL | 0 refills | Status: AC | PRN

## 2016-11-12 MED ORDER — RESOURCE THICKENUP CLEAR PO POWD: ORAL | Status: AC

## 2016-11-12 MED ORDER — ENSURE ENLIVE PO LIQD: 237.0000 mL | Freq: Two times a day (BID) | ORAL | 12 refills | Status: AC

## 2016-11-12 MED ORDER — CLONAZEPAM 0.5 MG PO TABS
0.2500 mg | ORAL_TABLET | Freq: Two times a day (BID) | ORAL | Status: DC | PRN
  Administered 2016-11-12: 0.25 mg via ORAL
  Filled 2016-11-12: qty 1

## 2016-11-12 MED ORDER — MUPIROCIN 2 % EX OINT: 1.0000 "application " | TOPICAL_OINTMENT | Freq: Two times a day (BID) | CUTANEOUS | 0 refills | Status: AC

## 2016-11-12 NOTE — Progress Notes (Signed)
[facility] report #[phone].  Pt will transport via [facility]- completed medical necessity form and arranged transportation.  Pt's daughter Bonita Quin aware and agreeable to plan.   All information provided to facility via the HUB.  Ilean Skill, MSW, LCSW Clinical Social Work 11/12/2016 [phone]

## 2016-11-12 NOTE — Discharge Summary (Addendum)
Physician Discharge Summary  Elizabeth Keller WUJ:[id number] DOB: [birth year] DOA: 11/08/2016  PCP: Sandford Craze, NP  Admit date: 11/08/2016 Discharge date: 11/12/2016  Admitted From: snf Disposition:  SNF  Recommendations for Outpatient Follow-up:  1. Follow up with PCP in 1-2 weeks 2. Please obtain BMP/CBC in one week Please follow up with outpatient wound care.  Please feed her with assistance.   Discharge Condition:STABLE.  CODE STATUS:DNR Diet recommendation: dysphagia 2 diet with nectar thick liquid.  Liquids provided via: Cup;Straw Medication Administration: Crushed with puree Supervision: Staff to assist with self feeding;Full supervision/cueing for compensatory strategies Compensations: Minimize environmental distractions;Slow rate;Small sips/bites;Multiple dry swallows after each bite/sip Postural Changes and/or Swallow Maneuvers: Seated upright 90 degrees;Upright 30-60 min after meal  Brief/Interim Summary: 83 y.o.femalewith medical history significant of anxiety, osteoarthritis, asthma, degenerative disc disease, spinal stenosis, type 2 diabetes, chronic kidney disease, GERD, hypercholesterolemia, hypertension, IBS, sarcoidosis, dysphagia who was last admitted from 09/30/2016 until 10/05/2016 for sepsis secondary to UTI and is being brought to the emergency department due to altered mental status. She is unable to provide further history, but her daughter and son stated that she have been interacting with the staff and family members through the week, per her baseline, until the changes in mentation were noticed today. There has not been any recorded fever to their knowledge. She has a chronic indwelling Foley catheter and a chronic stage IV decubiti ulcer.  Discharge Diagnoses:  Principal Problem:   Altered mental status Active Problems:   Chronic kidney disease, stage III (moderate) (HCC)   Type II diabetes mellitus with renal manifestations (HCC)   Stage IV pressure  ulcer of sacral region (HCC)   GERD (gastroesophageal reflux disease)   Anemia, chronic disease   Chronic CHF (congestive heart failure) (HCC)   Hypernatremia   UTI (urinary tract infection)   Sepsis secondary to UTI (HCC)   Acute metabolic encephalopathy:  Secondary to UTi and electrolyte imbalance. Urine cultures grew yeast. Stop the rocephin and  Resume diflucan to complete 5 day course. Repleted electrolytes as needed. As per the family patient is at baseline.    Anemia of chronic disease:  H&H stable.   Stage 3 CKD: Creatinine at baseline.    Type 2 DM;  CBG (last 3)   Recent Labs (last 2 labs)    Recent Labs  11/11/16 0754 11/11/16 1157 11/11/16 1702  GLUCAP 123* 140* 119*      No change in her home medications.    Stage 4 Decubitus ulcer:  Wound care consulted and recommendations given.   afib with RVR:  Rate controlled.  Resume metoprolol.  Not a candidate for anti coagulation.   Chronic diastolic heart failure:  Appears stable. Not fluid overloaded.    H/o dysphagia:  slp eval recommended dysphagia 2 diet.   Severe protein energy malnutrition:  Dietary consulted and supplements added.    Discharge Instructions  Discharge Instructions    Discharge instructions    Complete by:  As directed    Please follow up with PCP in one week.  Recommend outpatient wound care follow up for the decubitus ulcer.     Allergies as of 11/12/2016      Reactions   Dicyclomine Other (See Comments)   Reaction:  Dizziness    Gabapentin Other (See Comments)   Reaction:  Dizziness    Metformin And Related Nausea And Vomiting   Lyrica [pregabalin] Other (See Comments)   Reaction:  Unknown   Topiramate Other (See Comments)  Reaction:  Unknown      Medication List    TAKE these medications   acetaminophen 500 MG tablet Commonly known as:  TYLENOL Take 1,000 mg by mouth every 8 (eight) hours as needed for moderate pain.   albuterol 108  (90 Base) MCG/ACT inhaler Commonly known as:  PROVENTIL HFA;VENTOLIN HFA Inhale 2 puffs into the lungs every 6 (six) hours as needed for wheezing or shortness of breath.   albuterol (2.5 MG/3ML) 0.083% nebulizer solution Commonly known as:  PROVENTIL Take 2.5 mg by nebulization every 6 (six) hours as needed for wheezing or shortness of breath.   allopurinol 100 MG tablet Commonly known as:  ZYLOPRIM Take 50 mg by mouth daily.   aspirin 81 MG tablet Take 81 mg by mouth daily.   baclofen 10 MG tablet Commonly known as:  LIORESAL Take 10 mg by mouth 2 (two) times daily.   bisacodyl 10 MG suppository Commonly known as:  DULCOLAX Place 1 suppository (10 mg total) rectally daily as needed for moderate constipation.   busPIRone 10 MG tablet Commonly known as:  BUSPAR Take 10 mg by mouth 3 (three) times daily.   clonazePAM 0.5 MG tablet Commonly known as:  KLONOPIN Take 0.5 tablets (0.25 mg total) by mouth 2 (two) times daily as needed for anxiety.   collagenase ointment Commonly known as:  SANTYL Apply topically daily. Apply to sacrum and left heel wounds daily What changed:  how much to take  additional instructions   DECUBI-VITE Caps Take 1 capsule by mouth daily.   diclofenac sodium 1 % Gel Commonly known as:  VOLTAREN APPLY 2 GRAMS EXTERNALLY TO THE AFFECTED AREA FOUR TIMES DAILY AS NEEDED FOR PAIN   feeding supplement (ENSURE ENLIVE) Liqd Take 237 mLs by mouth 2 (two) times daily between meals.   fluconazole 200 MG tablet Commonly known as:  DIFLUCAN Take 1 tablet (200 mg total) by mouth daily.   furosemide 20 MG tablet Commonly known as:  LASIX Take 1 tablet (20 mg total) by mouth daily as needed for edema. What changed:  when to take this  reasons to take this   ICY HOT 5 % Ptch Generic drug:  Menthol Apply 1 patch topically daily. Apply 1 patch to lower back daily in the morning, remove after 12 hours   megestrol 40 MG/ML suspension Commonly known  as:  MEGACE Take 800 mg by mouth daily.   mupirocin ointment 2 % Commonly known as:  BACTROBAN Place 1 application into the nose 2 (two) times daily.   omeprazole 40 MG capsule Commonly known as:  PRILOSEC Take 40 mg by mouth daily.   polyethylene glycol packet Commonly known as:  MIRALAX / GLYCOLAX Take 17 g by mouth daily.   potassium chloride SA 20 MEQ tablet Commonly known as:  K-DUR,KLOR-CON Take 1 tablet (20 mEq total) by mouth daily.   RESOURCE THICKENUP CLEAR Powd As needed with each meal   traMADol 50 MG tablet Commonly known as:  ULTRAM Take 1 tablet (50 mg total) by mouth every 6 (six) hours as needed.      Follow-up Information    Sandford Craze, NP. Schedule an appointment as soon as possible for a visit in 1 week(s).   Specialty:  Internal Medicine Contact information: [address] [place] [address] [phone]          Allergies  Allergen Reactions  . Dicyclomine Other (See Comments)    Reaction:  Dizziness   .  Gabapentin Other (See Comments)    Reaction:  Dizziness   . Metformin And Related Nausea And Vomiting  . Lyrica [Pregabalin] Other (See Comments)    Reaction:  Unknown  . Topiramate Other (See Comments)    Reaction:  Unknown    Consultations:  Wound care consult.    Procedures/Studies: Dg Chest 1 View  Result Date: 11/08/2016 CLINICAL DATA:  Altered mental status.  Decubitus ulcer. EXAM: CHEST 1 VIEW COMPARISON:  09/30/2016 and 05/15/2016 FINDINGS: Lungs are adequately inflated as patient is rotated to the right. There is mild opacification with coarse interstitial changes over the apices which is stable. Remainder the lungs are clear. Mild stable cardiomegaly. Remainder the exam is unchanged. IMPRESSION: Chronic changes over the lung apices.  No acute findings. Electronically Signed   By: Elberta Fortis M.D.   On: 11/08/2016 21:30   Ct Head Wo Contrast  Result Date: 11/08/2016 CLINICAL DATA:  Unexplained  altered level of consciousness. Patient is responsive to verbal stimuli and afebrile. EXAM: CT HEAD WITHOUT CONTRAST TECHNIQUE: Contiguous axial images were obtained from the base of the skull through the vertex without intravenous contrast. COMPARISON:  03/15/2016 FINDINGS: Brain: Examination is limited due to motion artifact. There is diffuse cerebral atrophy. Ventricular dilatation consistent with central atrophy. Low-attenuation changes in the deep white matter consistent with small vessel ischemia. No mass-effect or midline shift. No abnormal extra-axial fluid collections. The gray-white matter junctions are distinct. The basal cisterns are not effaced. No acute intracranial hemorrhage. Vascular: Internal carotid vascular calcifications. Skull: No depressed skull fractures. Sinuses/Orbits: Paranasal sinuses and mastoid air cells are clear. Other: None. IMPRESSION: No acute intracranial abnormalities. Chronic atrophy and small vessel ischemic changes. Electronically Signed   By: Burman Nieves M.D.   On: 11/08/2016 21:57   Dg Op Swallowing Func-medicare/speech Path  Result Date: 11/04/2016 Objective Swallowing Evaluation: Type of Study: MBS-Modified Barium Swallow Study Patient Details Name: Elizabeth Keller MRN: [id number] Date of Birth: [birth year] Today's Date: 11/04/2016 Time: SLP Start Time (ACUTE ONLY): 1138-SLP Stop Time (ACUTE ONLY): 1218 SLP Time Calculation (min) (ACUTE ONLY): 40 min Past Medical History: Past Medical History: Diagnosis Date . Anxiety disorder  . Arthritis  . Asthma  . Chronic kidney disease  . Degenerative disk disease  . Diabetes mellitus  . GERD (gastroesophageal reflux disease)  . Hypercholesteremia  . Hypertension  . IBS (irritable bowel syndrome)  . Sarcoidosis  . Sepsis (HCC) 06/29/2016 . Spinal stenosis  Past Surgical History: Past Surgical History: Procedure Laterality Date . EYE SURGERY  2017  cataract / glaucoma HPI: Pt is a 83 yo female who presents for OP MBS to assess  current swallowing function to help in making diet recommendations. Her daughter says that she is on a pureed diet, but does not eat any of the food, and that she sometimes has her liquids thickened. Two clinical swallowing evaluations were completed earlier this year (08/18 and 02/18), both recommending soft or chopped foods but with thin liquids. PMH includes CHF, GERD, DM, CKD, FTT, anxiety, cognitive impairment Subjective: pt is alert but does not always open her eyes, needs coaxing from daughter to eat Assessment / Plan / Recommendation CHL IP CLINICAL IMPRESSIONS 11/04/2016 Clinical Impression Pt has a moderate oral and mild pharyngeal dysphagia that is likely cognitive in nature. It is marked by limited oral acceptance with solids, requiring encouragement from daughter to take small bites of both pureed and soft solids. Oral holding is prolonged and oral transit requires Max cueing but is not  facilitated by liquid washes. There is some lingual residue that remains in her oral cavity that ultimately is cleared by a second swallow. Pharyngeally she does relatively well, with no significant residue remaining post-swallow. She has impaired timing with thin liquids, which are silently aspirated before the swallow, and cannot be cleared as pt does not follow commands to cough. If the pt/family goals are to maximize safety and reduce aspiration, I would recommend Dys 1 diet and nectar thick liquids; however, it also seems like their priorities may be to allow the patient to have the food/drink options that she prefers. They may wish to liberalize to Dys 2 diet, which would be effortful and laborious with limited intake, but per the daughter's report she already has minimal intake. Will defer diet recommendation to primary SLP, who can see pt across the context of a meal. SLP Visit Diagnosis Dysphagia, oropharyngeal phase (R13.12) Attention and concentration deficit following -- Frontal lobe and executive function  deficit following -- Impact on safety and function Moderate aspiration risk   CHL IP TREATMENT RECOMMENDATION 11/04/2016 Treatment Recommendations Defer treatment plan to f/u with SLP   Prognosis 11/04/2016 Prognosis for Safe Diet Advancement Fair Barriers to Reach Goals Cognitive deficits Barriers/Prognosis Comment -- CHL IP DIET RECOMMENDATION 11/04/2016 SLP Diet Recommendations Dysphagia 1 (Puree) solids;Nectar thick liquid;Other (Comment) Liquid Administration via Cup;Straw Medication Administration Crushed with puree Compensations Minimize environmental distractions;Slow rate;Small sips/bites Postural Changes Remain semi-upright after after feeds/meals (Comment);Seated upright at 90 degrees   CHL IP OTHER RECOMMENDATIONS 11/04/2016 Recommended Consults -- Oral Care Recommendations Oral care BID Other Recommendations --   CHL IP FOLLOW UP RECOMMENDATIONS 11/04/2016 Follow up Recommendations Skilled Nursing facility   [facility] IP FREQUENCY AND DURATION 10/02/2016 Speech Therapy Frequency (ACUTE ONLY) min 1 x/week Treatment Duration 1 week      CHL IP ORAL PHASE 11/04/2016 Oral Phase Impaired Oral - Pudding Teaspoon -- Oral - Pudding Cup -- Oral - Honey Teaspoon -- Oral - Honey Cup -- Oral - Nectar Teaspoon -- Oral - Nectar Cup Reduced posterior propulsion;Holding of bolus;Lingual/palatal residue;Delayed oral transit Oral - Nectar Straw Reduced posterior propulsion;Holding of bolus;Lingual/palatal residue;Delayed oral transit Oral - Thin Teaspoon -- Oral - Thin Cup Reduced posterior propulsion;Holding of bolus;Lingual/palatal residue;Delayed oral transit Oral - Thin Straw -- Oral - Puree Reduced posterior propulsion;Holding of bolus;Lingual/palatal residue;Delayed oral transit Oral - Mech Soft Reduced posterior propulsion;Holding of bolus;Lingual/palatal residue;Delayed oral transit;Impaired mastication Oral - Regular -- Oral - Multi-Consistency -- Oral - Pill -- Oral Phase - Comment --  CHL IP PHARYNGEAL PHASE 11/04/2016  Pharyngeal Phase Impaired Pharyngeal- Pudding Teaspoon -- Pharyngeal -- Pharyngeal- Pudding Cup -- Pharyngeal -- Pharyngeal- Honey Teaspoon -- Pharyngeal -- Pharyngeal- Honey Cup -- Pharyngeal -- Pharyngeal- Nectar Teaspoon -- Pharyngeal -- Pharyngeal- Nectar Cup Penetration/Aspiration during swallow Pharyngeal Material enters airway, remains ABOVE vocal cords then ejected out Pharyngeal- Nectar Straw Penetration/Aspiration during swallow Pharyngeal Material enters airway, remains ABOVE vocal cords then ejected out Pharyngeal- Thin Teaspoon -- Pharyngeal -- Pharyngeal- Thin Cup Penetration/Aspiration before swallow Pharyngeal Material enters airway, passes BELOW cords without attempt by patient to eject out (silent aspiration) Pharyngeal- Thin Straw -- Pharyngeal -- Pharyngeal- Puree WFL Pharyngeal -- Pharyngeal- Mechanical Soft WFL Pharyngeal -- Pharyngeal- Regular -- Pharyngeal -- Pharyngeal- Multi-consistency -- Pharyngeal -- Pharyngeal- Pill -- Pharyngeal -- Pharyngeal Comment --  CHL IP CERVICAL ESOPHAGEAL PHASE 11/04/2016 Cervical Esophageal Phase WFL Pudding Teaspoon -- Pudding Cup -- Honey Teaspoon -- Honey Cup -- Nectar Teaspoon -- Nectar Cup -- Nectar Straw --  Thin Teaspoon -- Thin Cup -- Thin Straw -- Puree -- Mechanical Soft -- Regular -- Multi-consistency -- Pill -- Cervical Esophageal Comment -- CHL IP GO 11/04/2016 Functional Assessment Tool Used skilled clinical judgment Functional Limitations Swallowing Swallow Current Status (Z6109) CL Swallow Goal Status (U0454) CL Swallow Discharge Status (U9811) CL Motor Speech Current Status (B1478) (None) Motor Speech Goal Status (G9562) (None) Motor Speech Goal Status (Z3086) (None) Spoken Language Comprehension Current Status (V7846) (None) Spoken Language Comprehension Goal Status (N6295) (None) Spoken Language Comprehension Discharge Status (M8413) (None) Spoken Language Expression Current Status (K4401) (None) Spoken Language Expression Goal Status (U2725)  (None) Spoken Language Expression Discharge Status (D6644) (None) Attention Current Status (I3474) (None) Attention Goal Status (Q5956) (None) Attention Discharge Status (L8756) (None) Memory Current Status (E3329) (None) Memory Goal Status (J1884) (None) Memory Discharge Status (Z6606) (None) Voice Current Status (T0160) (None) Voice Goal Status (F0932) (None) Voice Discharge Status (T5573) (None) Other Speech-Language Pathology Functional Limitation Current Status (U2025) (None) Other Speech-Language Pathology Functional Limitation Goal Status (K2706) (None) Other Speech-Language Pathology Functional Limitation Discharge Status [phone]) (None) Maxcine Ham 11/04/2016, 2:45 PM  Maxcine Ham, M.A. CCC-SLP [phone]           CLINICAL DATA:  Difficulty swallowing. EXAM: MODIFIED BARIUM SWALLOW TECHNIQUE: Different consistencies of barium were administered orally to the patient by the Speech Pathologist. Imaging of the pharynx was performed in the lateral projection. FLUOROSCOPY TIME:  Fluoroscopy Time:  1 minutes and 52 seconds. COMPARISON:  None. FINDINGS: Thin liquid- frank aspiration without cough. Nectar thick liquid- delayed oral transit with flash penetration. Pure- no aspiration. Pure with cracker- delayed oral transit.  No aspiration. MPRESSION: Homero Fellers aspiration of thin liquid without cough reflex. Other consistencies demonstrate delayed oral transit with flash penetration but no frank aspiration. Please refer to the Speech Pathologists report for complete details and recommendations. Electronically Signed   By: Kennith Center M.D.   On: 11/04/2016 12:16       Subjective: No new complaints.   Discharge Exam: Vitals:   11/11/16 2043 11/12/16 0419  BP: (!) 95/57 97/62  Pulse: 100 (!) 113  Resp: 20 18  Temp: 98.1 F (36.7 C) 98.3 F (36.8 C)  SpO2: 97% 95%   Vitals:   11/11/16 1248 11/11/16 1334 11/11/16 2043 11/12/16 0419  BP: (!) 134/57  (!) 95/57 97/62  Pulse: 99  100 (!)  113  Resp: 20  20 18   Temp: 98.9 F (37.2 C)  98.1 F (36.7 C) 98.3 F (36.8 C)  TempSrc: Oral  Oral Oral  SpO2: 98% 99% 97% 95%  Weight:      Height:        General: Pt is alert, awake, not in acute distress Cardiovascular: RRR, S1/S2 +, no rubs, no gallops Respiratory: CTA bilaterally, no wheezing, no rhonchi Abdominal: Soft, NT, ND, bowel sounds + Extremities: no edema, no cyanosis Skin: Stage 4 decubitus ulcer.    The results of significant diagnostics from this hospitalization (including imaging, microbiology, ancillary and laboratory) are listed below for reference.     Microbiology: Recent Results (from the past 240 hour(s))  Urine culture     Status: Abnormal   Collection Time: 11/08/16  9:36 PM  Result Value Ref Range Status   Specimen Description URINE, CATHETERIZED  Final   Special Requests NONE  Final   Culture >=100,000 COLONIES/mL YEAST (A)  Final   Report Status 11/10/2016 FINAL  Final  MRSA PCR Screening     Status: Abnormal   Collection  Time: 11/10/16  9:20 AM  Result Value Ref Range Status   MRSA by PCR POSITIVE (A) NEGATIVE Final    Comment:        The GeneXpert MRSA Assay (FDA approved for NASAL specimens only), is one component of a comprehensive MRSA colonization surveillance program. It is not intended to diagnose MRSA infection nor to guide or monitor treatment for MRSA infections. RESULT CALLED TO, READ BACK BY AND VERIFIED WITH: CORCORAN,R @ 1733 ON [id number] BY POTEAT,S      Labs: BNP (last 3 results)  Recent Labs  12/19/15 1100 03/14/16 1716  BNP 155.0* 197.2*   Basic Metabolic Panel:  Recent Labs Lab 11/08/16 2136 11/08/16 2303 11/09/16 1117 11/10/16 0446 11/11/16 0431  NA 149* 153* 144 141 139  K 4.1 2.3* 3.6 3.7 3.3*  CL 113* 123* 112* 111 111  CO2 29  --  25 21* 21*  GLUCOSE 175* 94 125* 158* 103*  BUN 32* 16 25* 25* 26*  CREATININE 1.09* 0.40* 0.83 0.89 0.91  CALCIUM 8.9  --  8.3* 8.0* 7.8*  MG  --   --  2.0   --   --   PHOS  --   --  2.7  --   --    Liver Function Tests:  Recent Labs Lab 11/08/16 2136 11/10/16 0446 11/11/16 0431  AST 17 14* 15  ALT 8* 7* 7*  ALKPHOS 69 56 53  BILITOT 0.2* 0.6 0.2*  PROT 6.2* 5.1* 5.0*  ALBUMIN 2.2* 2.0* 1.8*   No results for input(s): LIPASE, AMYLASE in the last 168 hours.  Recent Labs Lab 11/08/16 2136  AMMONIA 17   CBC:  Recent Labs Lab 11/08/16 2136 11/08/16 2250 11/08/16 2303 11/09/16 1117 11/10/16 0446 11/11/16 0431 11/12/16 0408  WBC 14.9* 6.8  --  8.8 13.6* 7.4 5.9  NEUTROABS 11.8*  --   --  7.0 12.0* 5.1 3.9  HGB 3.0* 5.2* 5.1* 8.1* 7.9* 7.5* 7.2*  HCT 9.2* 16.1* 15.0* 25.1* 24.1* 22.2* 22.3*  MCV 82.9 82.6  --  81.0 81.4 79.9 79.9  PLT 593* 264  --  407* 410* 356 342   Cardiac Enzymes: No results for input(s): CKTOTAL, CKMB, CKMBINDEX, TROPONINI in the last 168 hours. BNP: Invalid input(s): POCBNP CBG:  Recent Labs Lab 11/11/16 1702 11/11/16 2036 11/12/16 0107 11/12/16 0415 11/12/16 0807  GLUCAP 119* 182* 114* 130* 106*   D-Dimer No results for input(s): DDIMER in the last 72 hours. Hgb A1c No results for input(s): HGBA1C in the last 72 hours. Lipid Profile No results for input(s): CHOL, HDL, LDLCALC, TRIG, CHOLHDL, LDLDIRECT in the last 72 hours. Thyroid function studies No results for input(s): TSH, T4TOTAL, T3FREE, THYROIDAB in the last 72 hours.  Invalid input(s): FREET3 Anemia work up No results for input(s): VITAMINB12, FOLATE, FERRITIN, TIBC, IRON, RETICCTPCT in the last 72 hours. Urinalysis    Component Value Date/Time   COLORURINE YELLOW 11/08/2016 2136   APPEARANCEUR CLOUDY (A) 11/08/2016 2136   LABSPEC 1.017 11/08/2016 2136   PHURINE 6.0 11/08/2016 2136   GLUCOSEU NEGATIVE 11/08/2016 2136   HGBUR NEGATIVE 11/08/2016 2136   BILIRUBINUR NEGATIVE 11/08/2016 2136   KETONESUR NEGATIVE 11/08/2016 2136   PROTEINUR NEGATIVE 11/08/2016 2136   UROBILINOGEN 0.2 10/18/2014 2310   NITRITE NEGATIVE  11/08/2016 2136   LEUKOCYTESUR LARGE (A) 11/08/2016 2136   Sepsis Labs Invalid input(s): PROCALCITONIN,  WBC,  LACTICIDVEN Microbiology Recent Results (from the past 240 hour(s))  Urine culture     Status:  Abnormal   Collection Time: 11/08/16  9:36 PM  Result Value Ref Range Status   Specimen Description URINE, CATHETERIZED  Final   Special Requests NONE  Final   Culture >=100,000 COLONIES/mL YEAST (A)  Final   Report Status 11/10/2016 FINAL  Final  MRSA PCR Screening     Status: Abnormal   Collection Time: 11/10/16  9:20 AM  Result Value Ref Range Status   MRSA by PCR POSITIVE (A) NEGATIVE Final    Comment:        The GeneXpert MRSA Assay (FDA approved for NASAL specimens only), is one component of a comprehensive MRSA colonization surveillance program. It is not intended to diagnose MRSA infection nor to guide or monitor treatment for MRSA infections. RESULT CALLED TO, READ BACK BY AND VERIFIED WITH: CORCORAN,R @ 1733 ON [id number] BY POTEAT,S      Time coordinating discharge: Over 30 minutes  SIGNED:   Kathlen Mody, MD  [facility] 11/12/2016, 11:21 AM Pager   If 7PM-7AM, please contact night-coverage [email or web address] Password [email or web address]

## 2016-11-12 NOTE — Progress Notes (Signed)
Called and gave report to RN at [facility]. Plan of care reviewed and questions answered.

## 2016-11-12 NOTE — Progress Notes (Signed)
Resumed care of this patient from prior nurse and agree with previous assessment.  

## 2016-11-13 DIAGNOSIS — M6281 Muscle weakness (generalized): Secondary | ICD-10-CM | POA: Diagnosis not present

## 2016-11-13 DIAGNOSIS — R1312 Dysphagia, oropharyngeal phase: Secondary | ICD-10-CM | POA: Diagnosis not present

## 2016-11-13 DIAGNOSIS — A419 Sepsis, unspecified organism: Secondary | ICD-10-CM | POA: Diagnosis not present

## 2016-11-16 DIAGNOSIS — R1312 Dysphagia, oropharyngeal phase: Secondary | ICD-10-CM | POA: Diagnosis not present

## 2016-11-16 DIAGNOSIS — M6281 Muscle weakness (generalized): Secondary | ICD-10-CM | POA: Diagnosis not present

## 2016-11-16 DIAGNOSIS — A419 Sepsis, unspecified organism: Secondary | ICD-10-CM | POA: Diagnosis not present

## 2016-11-17 DIAGNOSIS — M6281 Muscle weakness (generalized): Secondary | ICD-10-CM | POA: Diagnosis not present

## 2016-11-17 DIAGNOSIS — R1312 Dysphagia, oropharyngeal phase: Secondary | ICD-10-CM | POA: Diagnosis not present

## 2016-11-17 DIAGNOSIS — A419 Sepsis, unspecified organism: Secondary | ICD-10-CM | POA: Diagnosis not present

## 2016-11-18 DIAGNOSIS — R1312 Dysphagia, oropharyngeal phase: Secondary | ICD-10-CM | POA: Diagnosis not present

## 2016-11-18 DIAGNOSIS — A419 Sepsis, unspecified organism: Secondary | ICD-10-CM | POA: Diagnosis not present

## 2016-11-18 DIAGNOSIS — M6281 Muscle weakness (generalized): Secondary | ICD-10-CM | POA: Diagnosis not present

## 2016-11-19 DIAGNOSIS — N390 Urinary tract infection, site not specified: Secondary | ICD-10-CM | POA: Diagnosis not present

## 2016-11-19 DIAGNOSIS — R1312 Dysphagia, oropharyngeal phase: Secondary | ICD-10-CM | POA: Diagnosis not present

## 2016-11-19 DIAGNOSIS — A419 Sepsis, unspecified organism: Secondary | ICD-10-CM | POA: Diagnosis not present

## 2016-11-19 DIAGNOSIS — Z79899 Other long term (current) drug therapy: Secondary | ICD-10-CM | POA: Diagnosis not present

## 2016-11-19 DIAGNOSIS — M6281 Muscle weakness (generalized): Secondary | ICD-10-CM | POA: Diagnosis not present

## 2016-11-20 DIAGNOSIS — M6281 Muscle weakness (generalized): Secondary | ICD-10-CM | POA: Diagnosis not present

## 2016-11-20 DIAGNOSIS — R1312 Dysphagia, oropharyngeal phase: Secondary | ICD-10-CM | POA: Diagnosis not present

## 2016-11-20 DIAGNOSIS — A419 Sepsis, unspecified organism: Secondary | ICD-10-CM | POA: Diagnosis not present

## 2016-11-23 DIAGNOSIS — L89610 Pressure ulcer of right heel, unstageable: Secondary | ICD-10-CM | POA: Diagnosis not present

## 2016-11-23 DIAGNOSIS — A419 Sepsis, unspecified organism: Secondary | ICD-10-CM | POA: Diagnosis not present

## 2016-11-23 DIAGNOSIS — L89623 Pressure ulcer of left heel, stage 3: Secondary | ICD-10-CM | POA: Diagnosis not present

## 2016-11-23 DIAGNOSIS — M6281 Muscle weakness (generalized): Secondary | ICD-10-CM | POA: Diagnosis not present

## 2016-11-23 DIAGNOSIS — L89154 Pressure ulcer of sacral region, stage 4: Secondary | ICD-10-CM | POA: Diagnosis not present

## 2016-11-23 DIAGNOSIS — R1312 Dysphagia, oropharyngeal phase: Secondary | ICD-10-CM | POA: Diagnosis not present

## 2016-11-24 DIAGNOSIS — N3010 Interstitial cystitis (chronic) without hematuria: Secondary | ICD-10-CM | POA: Diagnosis not present

## 2016-11-24 DIAGNOSIS — I719 Aortic aneurysm of unspecified site, without rupture: Secondary | ICD-10-CM | POA: Diagnosis not present

## 2016-11-24 DIAGNOSIS — R1312 Dysphagia, oropharyngeal phase: Secondary | ICD-10-CM | POA: Diagnosis not present

## 2016-11-24 DIAGNOSIS — Z66 Do not resuscitate: Secondary | ICD-10-CM | POA: Diagnosis not present

## 2016-11-24 DIAGNOSIS — I700 Atherosclerosis of aorta: Secondary | ICD-10-CM | POA: Diagnosis not present

## 2016-11-24 DIAGNOSIS — E041 Nontoxic single thyroid nodule: Secondary | ICD-10-CM | POA: Diagnosis not present

## 2016-11-24 DIAGNOSIS — D649 Anemia, unspecified: Secondary | ICD-10-CM | POA: Diagnosis not present

## 2016-11-24 DIAGNOSIS — I712 Thoracic aortic aneurysm, without rupture: Secondary | ICD-10-CM | POA: Diagnosis not present

## 2016-11-24 DIAGNOSIS — A419 Sepsis, unspecified organism: Secondary | ICD-10-CM | POA: Diagnosis not present

## 2016-11-24 DIAGNOSIS — M6281 Muscle weakness (generalized): Secondary | ICD-10-CM | POA: Diagnosis not present

## 2016-11-24 DIAGNOSIS — R042 Hemoptysis: Secondary | ICD-10-CM | POA: Diagnosis not present

## 2016-11-24 DIAGNOSIS — R69 Illness, unspecified: Secondary | ICD-10-CM | POA: Diagnosis not present

## 2016-11-25 DIAGNOSIS — R1312 Dysphagia, oropharyngeal phase: Secondary | ICD-10-CM | POA: Diagnosis not present

## 2016-11-25 DIAGNOSIS — R4182 Altered mental status, unspecified: Secondary | ICD-10-CM | POA: Diagnosis not present

## 2016-11-25 DIAGNOSIS — R042 Hemoptysis: Secondary | ICD-10-CM | POA: Diagnosis not present

## 2016-11-25 DIAGNOSIS — M6281 Muscle weakness (generalized): Secondary | ICD-10-CM | POA: Diagnosis not present

## 2016-11-25 DIAGNOSIS — A419 Sepsis, unspecified organism: Secondary | ICD-10-CM | POA: Diagnosis not present

## 2016-11-26 DIAGNOSIS — R1312 Dysphagia, oropharyngeal phase: Secondary | ICD-10-CM | POA: Diagnosis not present

## 2016-11-26 DIAGNOSIS — A419 Sepsis, unspecified organism: Secondary | ICD-10-CM | POA: Diagnosis not present

## 2016-11-26 DIAGNOSIS — M6281 Muscle weakness (generalized): Secondary | ICD-10-CM | POA: Diagnosis not present

## 2016-11-27 DIAGNOSIS — R1312 Dysphagia, oropharyngeal phase: Secondary | ICD-10-CM | POA: Diagnosis not present

## 2016-11-27 DIAGNOSIS — M6281 Muscle weakness (generalized): Secondary | ICD-10-CM | POA: Diagnosis not present

## 2016-11-27 DIAGNOSIS — A419 Sepsis, unspecified organism: Secondary | ICD-10-CM | POA: Diagnosis not present

## 2016-11-28 DIAGNOSIS — M6281 Muscle weakness (generalized): Secondary | ICD-10-CM | POA: Diagnosis not present

## 2016-11-28 DIAGNOSIS — R1312 Dysphagia, oropharyngeal phase: Secondary | ICD-10-CM | POA: Diagnosis not present

## 2016-11-28 DIAGNOSIS — A419 Sepsis, unspecified organism: Secondary | ICD-10-CM | POA: Diagnosis not present

## 2016-11-30 DIAGNOSIS — M6281 Muscle weakness (generalized): Secondary | ICD-10-CM | POA: Diagnosis not present

## 2016-11-30 DIAGNOSIS — L89313 Pressure ulcer of right buttock, stage 3: Secondary | ICD-10-CM | POA: Diagnosis not present

## 2016-11-30 DIAGNOSIS — A419 Sepsis, unspecified organism: Secondary | ICD-10-CM | POA: Diagnosis not present

## 2016-11-30 DIAGNOSIS — L89154 Pressure ulcer of sacral region, stage 4: Secondary | ICD-10-CM | POA: Diagnosis not present

## 2016-11-30 DIAGNOSIS — L89610 Pressure ulcer of right heel, unstageable: Secondary | ICD-10-CM | POA: Diagnosis not present

## 2016-11-30 DIAGNOSIS — R1312 Dysphagia, oropharyngeal phase: Secondary | ICD-10-CM | POA: Diagnosis not present

## 2016-11-30 DIAGNOSIS — L89623 Pressure ulcer of left heel, stage 3: Secondary | ICD-10-CM | POA: Diagnosis not present

## 2016-12-02 DIAGNOSIS — N390 Urinary tract infection, site not specified: Secondary | ICD-10-CM | POA: Diagnosis not present

## 2016-12-07 DIAGNOSIS — L89154 Pressure ulcer of sacral region, stage 4: Secondary | ICD-10-CM | POA: Diagnosis not present

## 2016-12-07 DIAGNOSIS — Z2239 Carrier of other specified bacterial diseases: Secondary | ICD-10-CM | POA: Diagnosis not present

## 2016-12-07 DIAGNOSIS — B3749 Other urogenital candidiasis: Secondary | ICD-10-CM | POA: Diagnosis not present

## 2016-12-07 DIAGNOSIS — N390 Urinary tract infection, site not specified: Secondary | ICD-10-CM | POA: Diagnosis not present

## 2016-12-07 DIAGNOSIS — L89624 Pressure ulcer of left heel, stage 4: Secondary | ICD-10-CM | POA: Diagnosis not present

## 2016-12-08 DIAGNOSIS — Z23 Encounter for immunization: Secondary | ICD-10-CM | POA: Diagnosis not present

## 2016-12-09 DIAGNOSIS — R1312 Dysphagia, oropharyngeal phase: Secondary | ICD-10-CM | POA: Diagnosis not present

## 2016-12-09 DIAGNOSIS — M6281 Muscle weakness (generalized): Secondary | ICD-10-CM | POA: Diagnosis not present

## 2016-12-09 DIAGNOSIS — A419 Sepsis, unspecified organism: Secondary | ICD-10-CM | POA: Diagnosis not present

## 2016-12-16 DIAGNOSIS — A419 Sepsis, unspecified organism: Secondary | ICD-10-CM | POA: Diagnosis not present

## 2016-12-16 DIAGNOSIS — R1312 Dysphagia, oropharyngeal phase: Secondary | ICD-10-CM | POA: Diagnosis not present

## 2016-12-16 DIAGNOSIS — M6281 Muscle weakness (generalized): Secondary | ICD-10-CM | POA: Diagnosis not present

## 2016-12-17 DIAGNOSIS — M6281 Muscle weakness (generalized): Secondary | ICD-10-CM | POA: Diagnosis not present

## 2016-12-17 DIAGNOSIS — R1312 Dysphagia, oropharyngeal phase: Secondary | ICD-10-CM | POA: Diagnosis not present

## 2016-12-17 DIAGNOSIS — A419 Sepsis, unspecified organism: Secondary | ICD-10-CM | POA: Diagnosis not present

## 2016-12-21 DIAGNOSIS — L89313 Pressure ulcer of right buttock, stage 3: Secondary | ICD-10-CM | POA: Diagnosis not present

## 2016-12-21 DIAGNOSIS — F0390 Unspecified dementia without behavioral disturbance: Secondary | ICD-10-CM | POA: Diagnosis not present

## 2016-12-21 DIAGNOSIS — L89624 Pressure ulcer of left heel, stage 4: Secondary | ICD-10-CM | POA: Diagnosis not present

## 2016-12-21 DIAGNOSIS — L89614 Pressure ulcer of right heel, stage 4: Secondary | ICD-10-CM | POA: Diagnosis not present

## 2016-12-21 DIAGNOSIS — R1312 Dysphagia, oropharyngeal phase: Secondary | ICD-10-CM | POA: Diagnosis not present

## 2016-12-21 DIAGNOSIS — A419 Sepsis, unspecified organism: Secondary | ICD-10-CM | POA: Diagnosis not present

## 2016-12-21 DIAGNOSIS — L89154 Pressure ulcer of sacral region, stage 4: Secondary | ICD-10-CM | POA: Diagnosis not present

## 2016-12-21 DIAGNOSIS — M6281 Muscle weakness (generalized): Secondary | ICD-10-CM | POA: Diagnosis not present

## 2016-12-23 DIAGNOSIS — R627 Adult failure to thrive: Secondary | ICD-10-CM | POA: Diagnosis not present

## 2016-12-23 DIAGNOSIS — R1312 Dysphagia, oropharyngeal phase: Secondary | ICD-10-CM | POA: Diagnosis not present

## 2016-12-23 DIAGNOSIS — Z960 Presence of urogenital implants: Secondary | ICD-10-CM | POA: Diagnosis not present

## 2016-12-23 DIAGNOSIS — L89154 Pressure ulcer of sacral region, stage 4: Secondary | ICD-10-CM | POA: Diagnosis not present

## 2016-12-23 DIAGNOSIS — N390 Urinary tract infection, site not specified: Secondary | ICD-10-CM | POA: Diagnosis not present

## 2016-12-23 DIAGNOSIS — M6281 Muscle weakness (generalized): Secondary | ICD-10-CM | POA: Diagnosis not present

## 2016-12-23 DIAGNOSIS — A419 Sepsis, unspecified organism: Secondary | ICD-10-CM | POA: Diagnosis not present

## 2016-12-28 DIAGNOSIS — L89624 Pressure ulcer of left heel, stage 4: Secondary | ICD-10-CM | POA: Diagnosis not present

## 2016-12-28 DIAGNOSIS — L89614 Pressure ulcer of right heel, stage 4: Secondary | ICD-10-CM | POA: Diagnosis not present

## 2016-12-28 DIAGNOSIS — L89154 Pressure ulcer of sacral region, stage 4: Secondary | ICD-10-CM | POA: Diagnosis not present

## 2016-12-28 DIAGNOSIS — L89213 Pressure ulcer of right hip, stage 3: Secondary | ICD-10-CM | POA: Diagnosis not present

## 2017-01-04 DIAGNOSIS — M1990 Unspecified osteoarthritis, unspecified site: Secondary | ICD-10-CM | POA: Diagnosis not present

## 2017-01-04 DIAGNOSIS — N390 Urinary tract infection, site not specified: Secondary | ICD-10-CM | POA: Diagnosis not present

## 2017-01-08 DIAGNOSIS — N390 Urinary tract infection, site not specified: Secondary | ICD-10-CM | POA: Diagnosis not present

## 2017-01-08 DIAGNOSIS — L89614 Pressure ulcer of right heel, stage 4: Secondary | ICD-10-CM | POA: Diagnosis not present

## 2017-01-08 DIAGNOSIS — L89154 Pressure ulcer of sacral region, stage 4: Secondary | ICD-10-CM | POA: Diagnosis not present

## 2017-01-08 DIAGNOSIS — L89313 Pressure ulcer of right buttock, stage 3: Secondary | ICD-10-CM | POA: Diagnosis not present

## 2017-01-08 DIAGNOSIS — L89624 Pressure ulcer of left heel, stage 4: Secondary | ICD-10-CM | POA: Diagnosis not present

## 2017-01-12 DIAGNOSIS — N390 Urinary tract infection, site not specified: Secondary | ICD-10-CM | POA: Diagnosis not present

## 2017-01-15 DIAGNOSIS — N390 Urinary tract infection, site not specified: Secondary | ICD-10-CM | POA: Diagnosis not present

## 2017-01-19 DIAGNOSIS — R1312 Dysphagia, oropharyngeal phase: Secondary | ICD-10-CM | POA: Diagnosis not present

## 2017-01-19 DIAGNOSIS — A419 Sepsis, unspecified organism: Secondary | ICD-10-CM | POA: Diagnosis not present

## 2017-01-19 DIAGNOSIS — M6281 Muscle weakness (generalized): Secondary | ICD-10-CM | POA: Diagnosis not present

## 2017-01-20 DIAGNOSIS — R1312 Dysphagia, oropharyngeal phase: Secondary | ICD-10-CM | POA: Diagnosis not present

## 2017-01-20 DIAGNOSIS — A419 Sepsis, unspecified organism: Secondary | ICD-10-CM | POA: Diagnosis not present

## 2017-01-20 DIAGNOSIS — M6281 Muscle weakness (generalized): Secondary | ICD-10-CM | POA: Diagnosis not present

## 2017-01-21 DIAGNOSIS — A419 Sepsis, unspecified organism: Secondary | ICD-10-CM | POA: Diagnosis not present

## 2017-01-21 DIAGNOSIS — R1312 Dysphagia, oropharyngeal phase: Secondary | ICD-10-CM | POA: Diagnosis not present

## 2017-01-21 DIAGNOSIS — M6281 Muscle weakness (generalized): Secondary | ICD-10-CM | POA: Diagnosis not present

## 2017-01-22 DIAGNOSIS — L89223 Pressure ulcer of left hip, stage 3: Secondary | ICD-10-CM | POA: Diagnosis not present

## 2017-01-22 DIAGNOSIS — L89624 Pressure ulcer of left heel, stage 4: Secondary | ICD-10-CM | POA: Diagnosis not present

## 2017-01-22 DIAGNOSIS — R1312 Dysphagia, oropharyngeal phase: Secondary | ICD-10-CM | POA: Diagnosis not present

## 2017-01-22 DIAGNOSIS — L89614 Pressure ulcer of right heel, stage 4: Secondary | ICD-10-CM | POA: Diagnosis not present

## 2017-01-22 DIAGNOSIS — L89213 Pressure ulcer of right hip, stage 3: Secondary | ICD-10-CM | POA: Diagnosis not present

## 2017-01-22 DIAGNOSIS — L89154 Pressure ulcer of sacral region, stage 4: Secondary | ICD-10-CM | POA: Diagnosis not present

## 2017-01-22 DIAGNOSIS — M6281 Muscle weakness (generalized): Secondary | ICD-10-CM | POA: Diagnosis not present

## 2017-01-22 DIAGNOSIS — A419 Sepsis, unspecified organism: Secondary | ICD-10-CM | POA: Diagnosis not present

## 2017-01-25 DIAGNOSIS — L89213 Pressure ulcer of right hip, stage 3: Secondary | ICD-10-CM | POA: Diagnosis not present

## 2017-01-25 DIAGNOSIS — L89154 Pressure ulcer of sacral region, stage 4: Secondary | ICD-10-CM | POA: Diagnosis not present

## 2017-01-25 DIAGNOSIS — L89624 Pressure ulcer of left heel, stage 4: Secondary | ICD-10-CM | POA: Diagnosis not present

## 2017-01-25 DIAGNOSIS — R1312 Dysphagia, oropharyngeal phase: Secondary | ICD-10-CM | POA: Diagnosis not present

## 2017-01-25 DIAGNOSIS — N183 Chronic kidney disease, stage 3 (moderate): Secondary | ICD-10-CM | POA: Diagnosis not present

## 2017-01-25 DIAGNOSIS — F0390 Unspecified dementia without behavioral disturbance: Secondary | ICD-10-CM | POA: Diagnosis not present

## 2017-01-25 DIAGNOSIS — L89614 Pressure ulcer of right heel, stage 4: Secondary | ICD-10-CM | POA: Diagnosis not present

## 2017-01-25 DIAGNOSIS — L89223 Pressure ulcer of left hip, stage 3: Secondary | ICD-10-CM | POA: Diagnosis not present

## 2017-01-25 DIAGNOSIS — I5032 Chronic diastolic (congestive) heart failure: Secondary | ICD-10-CM | POA: Diagnosis not present

## 2017-01-25 DIAGNOSIS — M6281 Muscle weakness (generalized): Secondary | ICD-10-CM | POA: Diagnosis not present

## 2017-01-25 DIAGNOSIS — F419 Anxiety disorder, unspecified: Secondary | ICD-10-CM | POA: Diagnosis not present

## 2017-01-25 DIAGNOSIS — A419 Sepsis, unspecified organism: Secondary | ICD-10-CM | POA: Diagnosis not present

## 2017-01-26 DIAGNOSIS — A419 Sepsis, unspecified organism: Secondary | ICD-10-CM | POA: Diagnosis not present

## 2017-01-26 DIAGNOSIS — M6281 Muscle weakness (generalized): Secondary | ICD-10-CM | POA: Diagnosis not present

## 2017-01-26 DIAGNOSIS — R1312 Dysphagia, oropharyngeal phase: Secondary | ICD-10-CM | POA: Diagnosis not present

## 2017-01-27 DIAGNOSIS — R1312 Dysphagia, oropharyngeal phase: Secondary | ICD-10-CM | POA: Diagnosis not present

## 2017-01-27 DIAGNOSIS — M6281 Muscle weakness (generalized): Secondary | ICD-10-CM | POA: Diagnosis not present

## 2017-01-27 DIAGNOSIS — A419 Sepsis, unspecified organism: Secondary | ICD-10-CM | POA: Diagnosis not present

## 2017-01-28 DIAGNOSIS — M6281 Muscle weakness (generalized): Secondary | ICD-10-CM | POA: Diagnosis not present

## 2017-01-28 DIAGNOSIS — R1312 Dysphagia, oropharyngeal phase: Secondary | ICD-10-CM | POA: Diagnosis not present

## 2017-01-28 DIAGNOSIS — A419 Sepsis, unspecified organism: Secondary | ICD-10-CM | POA: Diagnosis not present

## 2017-01-29 DIAGNOSIS — A419 Sepsis, unspecified organism: Secondary | ICD-10-CM | POA: Diagnosis not present

## 2017-01-29 DIAGNOSIS — R1312 Dysphagia, oropharyngeal phase: Secondary | ICD-10-CM | POA: Diagnosis not present

## 2017-01-29 DIAGNOSIS — M6281 Muscle weakness (generalized): Secondary | ICD-10-CM | POA: Diagnosis not present

## 2017-02-01 DIAGNOSIS — R1312 Dysphagia, oropharyngeal phase: Secondary | ICD-10-CM | POA: Diagnosis not present

## 2017-02-01 DIAGNOSIS — A419 Sepsis, unspecified organism: Secondary | ICD-10-CM | POA: Diagnosis not present

## 2017-02-01 DIAGNOSIS — M6281 Muscle weakness (generalized): Secondary | ICD-10-CM | POA: Diagnosis not present

## 2017-02-03 DIAGNOSIS — M6281 Muscle weakness (generalized): Secondary | ICD-10-CM | POA: Diagnosis not present

## 2017-02-03 DIAGNOSIS — A419 Sepsis, unspecified organism: Secondary | ICD-10-CM | POA: Diagnosis not present

## 2017-02-03 DIAGNOSIS — R1312 Dysphagia, oropharyngeal phase: Secondary | ICD-10-CM | POA: Diagnosis not present

## 2017-02-04 DIAGNOSIS — M6281 Muscle weakness (generalized): Secondary | ICD-10-CM | POA: Diagnosis not present

## 2017-02-04 DIAGNOSIS — A419 Sepsis, unspecified organism: Secondary | ICD-10-CM | POA: Diagnosis not present

## 2017-02-04 DIAGNOSIS — R1312 Dysphagia, oropharyngeal phase: Secondary | ICD-10-CM | POA: Diagnosis not present

## 2017-02-05 DIAGNOSIS — R1312 Dysphagia, oropharyngeal phase: Secondary | ICD-10-CM | POA: Diagnosis not present

## 2017-02-05 DIAGNOSIS — A419 Sepsis, unspecified organism: Secondary | ICD-10-CM | POA: Diagnosis not present

## 2017-02-05 DIAGNOSIS — M6281 Muscle weakness (generalized): Secondary | ICD-10-CM | POA: Diagnosis not present

## 2017-02-07 DIAGNOSIS — M6281 Muscle weakness (generalized): Secondary | ICD-10-CM | POA: Diagnosis not present

## 2017-02-07 DIAGNOSIS — A419 Sepsis, unspecified organism: Secondary | ICD-10-CM | POA: Diagnosis not present

## 2017-02-07 DIAGNOSIS — R1312 Dysphagia, oropharyngeal phase: Secondary | ICD-10-CM | POA: Diagnosis not present

## 2017-02-08 DIAGNOSIS — A419 Sepsis, unspecified organism: Secondary | ICD-10-CM | POA: Diagnosis not present

## 2017-02-08 DIAGNOSIS — R1312 Dysphagia, oropharyngeal phase: Secondary | ICD-10-CM | POA: Diagnosis not present

## 2017-02-08 DIAGNOSIS — M6281 Muscle weakness (generalized): Secondary | ICD-10-CM | POA: Diagnosis not present

## 2017-02-09 DIAGNOSIS — M6281 Muscle weakness (generalized): Secondary | ICD-10-CM | POA: Diagnosis not present

## 2017-02-09 DIAGNOSIS — R1312 Dysphagia, oropharyngeal phase: Secondary | ICD-10-CM | POA: Diagnosis not present

## 2017-02-09 DIAGNOSIS — A419 Sepsis, unspecified organism: Secondary | ICD-10-CM | POA: Diagnosis not present

## 2017-02-10 DIAGNOSIS — N390 Urinary tract infection, site not specified: Secondary | ICD-10-CM | POA: Diagnosis not present

## 2017-02-17 ENCOUNTER — Emergency Department (HOSPITAL_COMMUNITY): Payer: Medicare Other

## 2017-02-17 ENCOUNTER — Encounter (HOSPITAL_COMMUNITY): Payer: Self-pay | Admitting: *Deleted

## 2017-02-17 ENCOUNTER — Inpatient Hospital Stay (HOSPITAL_COMMUNITY)
Admission: EM | Admit: 2017-02-17 | Discharge: 2017-03-12 | DRG: 871 | Disposition: E | Payer: Medicare Other | Attending: Family Medicine | Admitting: Family Medicine

## 2017-02-17 DIAGNOSIS — I130 Hypertensive heart and chronic kidney disease with heart failure and stage 1 through stage 4 chronic kidney disease, or unspecified chronic kidney disease: Secondary | ICD-10-CM | POA: Diagnosis present

## 2017-02-17 DIAGNOSIS — Z87891 Personal history of nicotine dependence: Secondary | ICD-10-CM

## 2017-02-17 DIAGNOSIS — G894 Chronic pain syndrome: Secondary | ICD-10-CM | POA: Diagnosis present

## 2017-02-17 DIAGNOSIS — E7800 Pure hypercholesterolemia, unspecified: Secondary | ICD-10-CM | POA: Diagnosis present

## 2017-02-17 DIAGNOSIS — Y95 Nosocomial condition: Secondary | ICD-10-CM | POA: Diagnosis present

## 2017-02-17 DIAGNOSIS — K219 Gastro-esophageal reflux disease without esophagitis: Secondary | ICD-10-CM | POA: Diagnosis present

## 2017-02-17 DIAGNOSIS — J45909 Unspecified asthma, uncomplicated: Secondary | ICD-10-CM | POA: Diagnosis present

## 2017-02-17 DIAGNOSIS — J189 Pneumonia, unspecified organism: Secondary | ICD-10-CM | POA: Diagnosis not present

## 2017-02-17 DIAGNOSIS — Z87440 Personal history of urinary (tract) infections: Secondary | ICD-10-CM

## 2017-02-17 DIAGNOSIS — R6521 Severe sepsis with septic shock: Secondary | ICD-10-CM | POA: Diagnosis present

## 2017-02-17 DIAGNOSIS — R0602 Shortness of breath: Secondary | ICD-10-CM | POA: Diagnosis not present

## 2017-02-17 DIAGNOSIS — Z66 Do not resuscitate: Secondary | ICD-10-CM | POA: Diagnosis present

## 2017-02-17 DIAGNOSIS — L89154 Pressure ulcer of sacral region, stage 4: Secondary | ICD-10-CM | POA: Diagnosis present

## 2017-02-17 DIAGNOSIS — L89619 Pressure ulcer of right heel, unspecified stage: Secondary | ICD-10-CM | POA: Diagnosis present

## 2017-02-17 DIAGNOSIS — I509 Heart failure, unspecified: Secondary | ICD-10-CM

## 2017-02-17 DIAGNOSIS — R0603 Acute respiratory distress: Secondary | ICD-10-CM | POA: Diagnosis not present

## 2017-02-17 DIAGNOSIS — I5032 Chronic diastolic (congestive) heart failure: Secondary | ICD-10-CM | POA: Diagnosis present

## 2017-02-17 DIAGNOSIS — A419 Sepsis, unspecified organism: Secondary | ICD-10-CM | POA: Diagnosis not present

## 2017-02-17 DIAGNOSIS — N183 Chronic kidney disease, stage 3 unspecified: Secondary | ICD-10-CM | POA: Diagnosis present

## 2017-02-17 DIAGNOSIS — R627 Adult failure to thrive: Secondary | ICD-10-CM | POA: Diagnosis not present

## 2017-02-17 DIAGNOSIS — D638 Anemia in other chronic diseases classified elsewhere: Secondary | ICD-10-CM | POA: Diagnosis not present

## 2017-02-17 DIAGNOSIS — I4891 Unspecified atrial fibrillation: Secondary | ICD-10-CM | POA: Diagnosis present

## 2017-02-17 DIAGNOSIS — E1122 Type 2 diabetes mellitus with diabetic chronic kidney disease: Secondary | ICD-10-CM | POA: Diagnosis present

## 2017-02-17 DIAGNOSIS — R0902 Hypoxemia: Secondary | ICD-10-CM

## 2017-02-17 DIAGNOSIS — N390 Urinary tract infection, site not specified: Secondary | ICD-10-CM | POA: Diagnosis present

## 2017-02-17 DIAGNOSIS — F0390 Unspecified dementia without behavioral disturbance: Secondary | ICD-10-CM | POA: Diagnosis present

## 2017-02-17 DIAGNOSIS — K589 Irritable bowel syndrome without diarrhea: Secondary | ICD-10-CM | POA: Diagnosis present

## 2017-02-17 DIAGNOSIS — Z79818 Long term (current) use of other agents affecting estrogen receptors and estrogen levels: Secondary | ICD-10-CM

## 2017-02-17 DIAGNOSIS — D869 Sarcoidosis, unspecified: Secondary | ICD-10-CM | POA: Diagnosis not present

## 2017-02-17 DIAGNOSIS — Z888 Allergy status to other drugs, medicaments and biological substances status: Secondary | ICD-10-CM

## 2017-02-17 DIAGNOSIS — Z7401 Bed confinement status: Secondary | ICD-10-CM

## 2017-02-17 DIAGNOSIS — J9601 Acute respiratory failure with hypoxia: Secondary | ICD-10-CM | POA: Diagnosis present

## 2017-02-17 DIAGNOSIS — L89620 Pressure ulcer of left heel, unstageable: Secondary | ICD-10-CM | POA: Diagnosis present

## 2017-02-17 DIAGNOSIS — R069 Unspecified abnormalities of breathing: Secondary | ICD-10-CM | POA: Diagnosis not present

## 2017-02-17 DIAGNOSIS — Z515 Encounter for palliative care: Secondary | ICD-10-CM | POA: Diagnosis present

## 2017-02-17 DIAGNOSIS — Z79899 Other long term (current) drug therapy: Secondary | ICD-10-CM

## 2017-02-17 DIAGNOSIS — D631 Anemia in chronic kidney disease: Secondary | ICD-10-CM | POA: Diagnosis present

## 2017-02-17 DIAGNOSIS — Z8249 Family history of ischemic heart disease and other diseases of the circulatory system: Secondary | ICD-10-CM

## 2017-02-17 DIAGNOSIS — R4189 Other symptoms and signs involving cognitive functions and awareness: Secondary | ICD-10-CM | POA: Diagnosis present

## 2017-02-17 DIAGNOSIS — N3000 Acute cystitis without hematuria: Secondary | ICD-10-CM | POA: Diagnosis not present

## 2017-02-17 DIAGNOSIS — M1990 Unspecified osteoarthritis, unspecified site: Secondary | ICD-10-CM | POA: Diagnosis present

## 2017-02-17 LAB — CBC WITH DIFFERENTIAL/PLATELET
Basophils Absolute: 0 10*3/uL (ref 0.0–0.1)
Basophils Relative: 0 %
EOS ABS: 0.1 10*3/uL (ref 0.0–0.7)
Eosinophils Relative: 1 %
HCT: 24.3 % — ABNORMAL LOW (ref 36.0–46.0)
Hemoglobin: 7.3 g/dL — ABNORMAL LOW (ref 12.0–15.0)
Lymphocytes Relative: 5 %
Lymphs Abs: 0.6 10*3/uL — ABNORMAL LOW (ref 0.7–4.0)
MCH: 28 pg (ref 26.0–34.0)
MCHC: 30 g/dL (ref 30.0–36.0)
MCV: 93.1 fL (ref 78.0–100.0)
MONO ABS: 1.3 10*3/uL — AB (ref 0.1–1.0)
Monocytes Relative: 10 %
NEUTROS PCT: 84 %
Neutro Abs: 10.9 10*3/uL — ABNORMAL HIGH (ref 1.7–7.7)
PLATELETS: 421 10*3/uL — AB (ref 150–400)
RBC: 2.61 MIL/uL — ABNORMAL LOW (ref 3.87–5.11)
RDW: 23.3 % — ABNORMAL HIGH (ref 11.5–15.5)
WBC: 12.9 10*3/uL — AB (ref 4.0–10.5)

## 2017-02-17 LAB — CBC
HEMATOCRIT: 29.8 % — AB (ref 36.0–46.0)
Hemoglobin: 8.8 g/dL — ABNORMAL LOW (ref 12.0–15.0)
MCH: 28.1 pg (ref 26.0–34.0)
MCHC: 29.5 g/dL — ABNORMAL LOW (ref 30.0–36.0)
MCV: 95.2 fL (ref 78.0–100.0)
PLATELETS: 410 10*3/uL — AB (ref 150–400)
RBC: 3.13 MIL/uL — ABNORMAL LOW (ref 3.87–5.11)
RDW: 23.7 % — AB (ref 11.5–15.5)
WBC: 16.2 10*3/uL — AB (ref 4.0–10.5)

## 2017-02-17 LAB — CREATININE, SERUM
Creatinine, Ser: 1.11 mg/dL — ABNORMAL HIGH (ref 0.44–1.00)
GFR calc non Af Amer: 42 mL/min — ABNORMAL LOW (ref >60)
GFR, EST AFRICAN AMERICAN: 49 mL/min — AB (ref >60)

## 2017-02-17 LAB — COMPREHENSIVE METABOLIC PANEL
ALT: 5 U/L — AB (ref 14–54)
AST: 15 U/L (ref 15–41)
Albumin: 2.2 g/dL — ABNORMAL LOW (ref 3.5–5.0)
Alkaline Phosphatase: 70 U/L (ref 38–126)
Anion gap: 5 (ref 5–15)
BUN: 39 mg/dL — ABNORMAL HIGH (ref 6–20)
CHLORIDE: 110 mmol/L (ref 101–111)
CO2: 28 mmol/L (ref 22–32)
CREATININE: 1.04 mg/dL — AB (ref 0.44–1.00)
Calcium: 9.1 mg/dL (ref 8.9–10.3)
GFR calc non Af Amer: 46 mL/min — ABNORMAL LOW (ref >60)
GFR, EST AFRICAN AMERICAN: 53 mL/min — AB (ref >60)
Glucose, Bld: 131 mg/dL — ABNORMAL HIGH (ref 65–99)
Potassium: 4.3 mmol/L (ref 3.5–5.1)
SODIUM: 143 mmol/L (ref 135–145)
Total Bilirubin: 0.6 mg/dL (ref 0.3–1.2)
Total Protein: 5.9 g/dL — ABNORMAL LOW (ref 6.5–8.1)

## 2017-02-17 LAB — I-STAT CG4 LACTIC ACID, ED: LACTIC ACID, VENOUS: 1.06 mmol/L (ref 0.5–1.9)

## 2017-02-17 LAB — INFLUENZA PANEL BY PCR (TYPE A & B)
INFLBPCR: NEGATIVE
Influenza A By PCR: NEGATIVE

## 2017-02-17 LAB — TROPONIN I: Troponin I: 0.04 ng/mL (ref <0.03)

## 2017-02-17 LAB — BRAIN NATRIURETIC PEPTIDE: B Natriuretic Peptide: 692.9 pg/mL — ABNORMAL HIGH (ref 0.0–100.0)

## 2017-02-17 LAB — LACTIC ACID, PLASMA: Lactic Acid, Venous: 1.7 mmol/L (ref 0.5–1.9)

## 2017-02-17 MED ORDER — PIPERACILLIN-TAZOBACTAM 3.375 G IVPB 30 MIN: 3.3750 g | Freq: Once | INTRAVENOUS | Status: DC

## 2017-02-17 MED ORDER — IPRATROPIUM-ALBUTEROL 0.5-2.5 (3) MG/3ML IN SOLN
3.0000 mL | Freq: Three times a day (TID) | RESPIRATORY_TRACT | Status: DC
  Administered 2017-02-18 (×2): 3 mL via RESPIRATORY_TRACT
  Filled 2017-02-17 (×2): qty 3

## 2017-02-17 MED ORDER — BUSPIRONE HCL 10 MG PO TABS: 10.0000 mg | ORAL_TABLET | Freq: Three times a day (TID) | ORAL | Status: DC

## 2017-02-17 MED ORDER — ACETAMINOPHEN 500 MG PO TABS: 1000.0000 mg | ORAL_TABLET | Freq: Three times a day (TID) | ORAL | Status: DC | PRN

## 2017-02-17 MED ORDER — VANCOMYCIN HCL IN DEXTROSE 1-5 GM/200ML-% IV SOLN
1000.0000 mg | Freq: Once | INTRAVENOUS | Status: AC
  Administered 2017-02-17: 1000 mg via INTRAVENOUS
  Filled 2017-02-17: qty 200

## 2017-02-17 MED ORDER — ALBUTEROL SULFATE (2.5 MG/3ML) 0.083% IN NEBU: 2.5000 mg | INHALATION_SOLUTION | Freq: Four times a day (QID) | RESPIRATORY_TRACT | Status: DC | PRN

## 2017-02-17 MED ORDER — DOXYCYCLINE HYCLATE 100 MG IV SOLR
100.0000 mg | Freq: Once | INTRAVENOUS | Status: AC
Start: 2017-02-17 — End: 2017-02-17
  Administered 2017-02-17: 100 mg via INTRAVENOUS
  Filled 2017-02-17: qty 100

## 2017-02-17 MED ORDER — SODIUM CHLORIDE 0.9 % IV BOLUS (SEPSIS)
500.0000 mL | Freq: Once | INTRAVENOUS | Status: DC
  Administered 2017-02-17: 500 mL via INTRAVENOUS

## 2017-02-17 MED ORDER — SODIUM CHLORIDE 0.9 % IV BOLUS (SEPSIS)
1000.0000 mL | Freq: Once | INTRAVENOUS | Status: AC
  Administered 2017-02-17: 1000 mL via INTRAVENOUS

## 2017-02-17 MED ORDER — POLYETHYLENE GLYCOL 3350 17 G PO PACK: 17.0000 g | PACK | Freq: Every day | ORAL | Status: DC

## 2017-02-17 MED ORDER — DOXYCYCLINE HYCLATE 100 MG PO TABS
100.0000 mg | ORAL_TABLET | Freq: Once | ORAL | Status: DC
  Filled 2017-02-17: qty 1

## 2017-02-17 MED ORDER — ONDANSETRON HCL 4 MG/2ML IJ SOLN: 4.0000 mg | Freq: Four times a day (QID) | INTRAMUSCULAR | Status: DC | PRN

## 2017-02-17 MED ORDER — METHYLPREDNISOLONE SODIUM SUCC 125 MG IJ SOLR
125.0000 mg | Freq: Once | INTRAMUSCULAR | Status: AC
  Administered 2017-02-17: 125 mg via INTRAVENOUS
  Filled 2017-02-17: qty 2

## 2017-02-17 MED ORDER — BACLOFEN 10 MG PO TABS: 10.0000 mg | ORAL_TABLET | Freq: Two times a day (BID) | ORAL | Status: DC

## 2017-02-17 MED ORDER — ONDANSETRON HCL 4 MG PO TABS: 4.0000 mg | ORAL_TABLET | Freq: Four times a day (QID) | ORAL | Status: DC | PRN

## 2017-02-17 MED ORDER — SODIUM CHLORIDE 0.9% FLUSH
3.0000 mL | Freq: Two times a day (BID) | INTRAVENOUS | Status: DC
  Administered 2017-02-17 – 2017-02-18 (×2): 3 mL via INTRAVENOUS

## 2017-02-17 MED ORDER — IPRATROPIUM-ALBUTEROL 0.5-2.5 (3) MG/3ML IN SOLN
3.0000 mL | Freq: Once | RESPIRATORY_TRACT | Status: AC
  Administered 2017-02-17: 3 mL via RESPIRATORY_TRACT
  Filled 2017-02-17: qty 3

## 2017-02-17 MED ORDER — METHYLPREDNISOLONE SODIUM SUCC 125 MG IJ SOLR
80.0000 mg | Freq: Two times a day (BID) | INTRAMUSCULAR | Status: DC
  Administered 2017-02-17 – 2017-02-18 (×2): 80 mg via INTRAVENOUS
  Filled 2017-02-17 (×2): qty 2

## 2017-02-17 MED ORDER — SODIUM CHLORIDE 0.9% FLUSH: 3.0000 mL | INTRAVENOUS | Status: DC | PRN

## 2017-02-17 MED ORDER — ALBUTEROL SULFATE (2.5 MG/3ML) 0.083% IN NEBU: 2.5000 mg | INHALATION_SOLUTION | RESPIRATORY_TRACT | Status: DC | PRN

## 2017-02-17 MED ORDER — ENOXAPARIN SODIUM 30 MG/0.3ML ~~LOC~~ SOLN
30.0000 mg | SUBCUTANEOUS | Status: DC
  Filled 2017-02-17: qty 0.3

## 2017-02-17 MED ORDER — VANCOMYCIN HCL IN DEXTROSE 1-5 GM/200ML-% IV SOLN: 1000.0000 mg | Freq: Once | INTRAVENOUS | Status: DC

## 2017-02-17 MED ORDER — RESOURCE THICKENUP CLEAR PO POWD
ORAL | Status: DC | PRN
  Administered 2017-02-17: 15:00:00 via ORAL
  Filled 2017-02-17: qty 125

## 2017-02-17 MED ORDER — IPRATROPIUM BROMIDE 0.02 % IN SOLN
0.5000 mg | Freq: Four times a day (QID) | RESPIRATORY_TRACT | Status: DC
  Administered 2017-02-17: 0.5 mg via RESPIRATORY_TRACT
  Filled 2017-02-17: qty 2.5

## 2017-02-17 MED ORDER — PIPERACILLIN-TAZOBACTAM 3.375 G IVPB 30 MIN
3.3750 g | Freq: Once | INTRAVENOUS | Status: DC
  Administered 2017-02-17: 3.375 g via INTRAVENOUS
  Filled 2017-02-17: qty 50

## 2017-02-17 MED ORDER — SODIUM CHLORIDE 0.9 % IV SOLN: 250.0000 mL | INTRAVENOUS | Status: DC | PRN

## 2017-02-17 NOTE — ED Notes (Signed)
Attempted to start an IV x 2 with no assistance. Will consult with IV team.

## 2017-02-17 NOTE — ED Notes (Signed)
Attempted another IV to also draw additional blood but it was unsuccessful. Also, attempted to draw from IV but was unable to. Asked Leta JunglingJake, RN to please look for another IV.

## 2017-02-17 NOTE — ED Notes (Signed)
Dr. Doran Clay. Elizabeth Keller is aware patient has not had any urine output since clapping the foley catheter tube.

## 2017-02-17 NOTE — ED Notes (Signed)
Dr. Marcello MooresIsaac is aware that only one blood culture was obtained when he started the ultrasound IV. He preferred the antibiotic to be started.

## 2017-02-17 NOTE — H&P (Signed)
History and Physical    Elizabeth Keller RUE:[id number] DOB: 01/26/1924 DOA: 03/02/2017  PCP: Sandford Craze'Sullivan, Melissa, NP  Patient coming from:  SNF  Chief Complaint:  Sob, cough  HPI: Elizabeth RoupVerna Santelli is a 84 y.o. female with medical history significant of dementia, bedbound state, GERD, sarcoidosis sent in from SNF for sob and hypoxia today.  Pt cannot provide any history due to her dementia.  Daughter is at bedside providing history.  She reports coughing for several days.  Unknown if any n/v/d or fevers.  Pt referred for likely HCAP.  Is on rocephin IM at snf for uti.  Review of Systems: unobtainable due to dementia  Past Medical History:  Diagnosis Date  . Anxiety disorder   . Arthritis   . Asthma   . Chronic kidney disease   . Degenerative disk disease   . Diabetes mellitus   . GERD (gastroesophageal reflux disease)   . Hypercholesteremia   . Hypertension   . IBS (irritable bowel syndrome)   . Sarcoidosis   . Sepsis (HCC) 06/29/2016  . Spinal stenosis     Past Surgical History:  Procedure Laterality Date  . EYE SURGERY  2017   cataract / glaucoma     reports that she has quit smoking. she has never used smokeless tobacco. She reports that she does not drink alcohol or use drugs.  Allergies  Allergen Reactions  . Dicyclomine Other (See Comments)    Reaction:  Dizziness   . Gabapentin Other (See Comments)    Reaction:  Dizziness   . Metformin And Related Nausea And Vomiting  . Lyrica [Pregabalin] Other (See Comments)    Reaction:  Unknown  . Topiramate Other (See Comments)    Reaction:  Unknown    Family History  Problem Relation Age of Onset  . Hypertension Other     Prior to Admission medications   Medication Sig Start Date End Date Taking? Authorizing Provider  acetaminophen (TYLENOL) 500 MG tablet Take 1,000 mg by mouth every 8 (eight) hours as needed for moderate pain.    Yes [provider]  albuterol (PROVENTIL) (2.5 MG/3ML) 0.083% nebulizer  solution Take 2.5 mg by nebulization every 6 (six) hours as needed for wheezing or shortness of breath.   Yes [provider]  allopurinol (ZYLOPRIM) 100 MG tablet Take 50 mg by mouth daily.   Yes [provider]  Amino Acids-Protein Hydrolys (FEEDING SUPPLEMENT, PRO-STAT SUGAR FREE 64,) LIQD Take 30 mLs by mouth 3 (three) times daily with meals.   Yes [provider]  baclofen (LIORESAL) 10 MG tablet Take 10 mg by mouth 2 (two) times daily.   Yes [provider]  bisacodyl (DULCOLAX) 10 MG suppository Place 1 suppository (10 mg total) rectally daily as needed for moderate constipation. 05/20/16  Yes Sheikh, Omair Latif, DO  busPIRone (BUSPAR) 10 MG tablet Take 10 mg by mouth 3 (three) times daily.   Yes [provider]  cefTRIAXone (ROCEPHIN) 1 g injection Inject 1 g into the muscle daily.   Yes [provider]  collagenase (SANTYL) ointment Apply topically daily. Apply to sacrum and left heel wounds daily Patient taking differently: Apply 1 application topically daily. Apply to bilateral heel wounds daily 10/05/16  Yes Osvaldo ShipperKrishnan, Gokul, MD  diclofenac sodium (VOLTAREN) 1 % GEL APPLY 2 GRAMS EXTERNALLY TO THE AFFECTED AREA FOUR TIMES DAILY AS NEEDED FOR PAIN 06/26/16  Yes Sandford Craze'Sullivan, Melissa, NP  furosemide (LASIX) 20 MG tablet Take 1 tablet (20 mg total) by mouth  daily as needed for edema. Patient taking differently: Take 20 mg by mouth daily.  11/12/16  Yes Kathlen Mody, MD  megestrol (MEGACE) 40 MG/ML suspension Take 800 mg by mouth daily.   Yes [provider]  Menthol (ICY HOT) 5 % PTCH Apply 1 patch topically daily. Apply 1 patch to lower back daily in the morning, remove after 12 hours   Yes [provider]  Multiple Vitamin (MULTIVITAMIN WITH MINERALS) TABS tablet Take 1 tablet by mouth daily.   Yes [provider]  omeprazole (PRILOSEC) 40 MG capsule Take 40 mg by mouth daily.    Yes [provider]    polyethylene glycol (MIRALAX / GLYCOLAX) packet Take 17 g by mouth daily. 10/06/16  Yes Osvaldo Shipper, MD  Probiotic Product (PROBIOTIC-10 PO) Take 1 tablet by mouth 2 (two) times daily.   Yes [provider]  clonazePAM (KLONOPIN) 0.5 MG tablet Take 0.5 tablets (0.25 mg total) by mouth 2 (two) times daily as needed for anxiety. Patient not taking: Reported on 02-24-2017 11/12/16   Kathlen Mody, MD  feeding supplement, ENSURE ENLIVE, (ENSURE ENLIVE) LIQD Take 237 mLs by mouth 2 (two) times daily between meals. Patient not taking: Reported on February 24, 2017 11/12/16   Kathlen Mody, MD  fluconazole (DIFLUCAN) 200 MG tablet Take 1 tablet (200 mg total) by mouth daily. Patient not taking: Reported on 2017/02/24 11/13/16   Kathlen Mody, MD  Maltodextrin-Xanthan Gum [facility] CLEAR) POWD As needed with each meal Patient not taking: Reported on February 24, 2017 11/12/16   Kathlen Mody, MD  potassium chloride SA (K-DUR,KLOR-CON) 20 MEQ tablet Take 1 tablet (20 mEq total) by mouth daily. Patient not taking: Reported on 02/24/2017 10/05/16   Osvaldo Shipper, MD  traMADol (ULTRAM) 50 MG tablet Take 1 tablet (50 mg total) by mouth every 6 (six) hours as needed. Patient not taking: Reported on 2017/02/24 10/05/16   Osvaldo Shipper, MD    Physical Exam: Vitals:   2017-02-24 1043 February 24, 2017 1325 02/24/2017 1530  BP: (!) 98/53 (!) 100/51 108/72  Pulse: 61 92 95  Resp: 18 18 (!) 21  Temp: 97.6 F (36.4 C) (!) 96.6 F (35.9 C)   TempSrc: Oral Rectal   SpO2: 96% 100% 99%      Constitutional: NAD, calm, comfortable Vitals:   24-Feb-2017 1043 2017-02-24 1325 24-Feb-2017 1530  BP: (!) 98/53 (!) 100/51 108/72  Pulse: 61 92 95  Resp: 18 18 (!) 21  Temp: 97.6 F (36.4 C) (!) 96.6 F (35.9 C)   TempSrc: Oral Rectal   SpO2: 96% 100% 99%   Eyes: PERRL, lids and conjunctivae normal ENMT: Mucous membranes are moist. Posterior pharynx clear of any exudate or lesions.Normal dentition.  Neck: normal, supple, no masses,  no thyromegaly Respiratory: rhonchi bilaterally with wheezing  Normal respiratory effort. No accessory muscle use.  Cardiovascular: Regular rate and rhythm, no murmurs / rubs / gallops. No extremity edema. 2+ pedal pulses. No carotid bruits.  Abdomen: no tenderness, no masses palpated. No hepatosplenomegaly. Bowel sounds positive.  Musculoskeletal: no clubbing / cyanosis. No joint deformity upper and lower extremities. Good ROM, no contractures. Normal muscle tone.  Skin: no rashes, lesions, ulcers. No induration Neurologic: CN 2-12 grossly intact. Sensation intact, DTR normal. Strength 5/5 in all 4.  Psychiatric: not agitated, demented, can respond yes/no  Labs on Admission: I have personally reviewed following labs and imaging studies  CBC: Recent Labs  Lab 24-Feb-2017 1407  WBC 12.9*  NEUTROABS 10.9*  HGB 7.3*  HCT 24.3*  MCV 93.1  PLT 421*   Basic Metabolic Panel: Recent Labs  Lab 02/16/2017 1407  NA 143  K 4.3  CL 110  CO2 28  GLUCOSE 131*  BUN 39*  CREATININE 1.04*  CALCIUM 9.1   GFR: CrCl cannot be calculated (Unknown ideal weight.). Liver Function Tests: Recent Labs  Lab 02/10/2017 1407  AST 15  ALT 5*  ALKPHOS 70  BILITOT 0.6  PROT 5.9*  ALBUMIN 2.2*   No results for input(s): LIPASE, AMYLASE in the last 168 hours. No results for input(s): AMMONIA in the last 168 hours. Coagulation Profile: No results for input(s): INR, PROTIME in the last 168 hours. Cardiac Enzymes: No results for input(s): CKTOTAL, CKMB, CKMBINDEX, TROPONINI in the last 168 hours. BNP (last 3 results) No results for input(s): PROBNP in the last 8760 hours. HbA1C: No results for input(s): HGBA1C in the last 72 hours. CBG: No results for input(s): GLUCAP in the last 168 hours. Lipid Profile: No results for input(s): CHOL, HDL, LDLCALC, TRIG, CHOLHDL, LDLDIRECT in the last 72 hours. Thyroid Function Tests: No results for input(s): TSH, T4TOTAL, FREET4, T3FREE, THYROIDAB in the last 72  hours. Anemia Panel: No results for input(s): VITAMINB12, FOLATE, FERRITIN, TIBC, IRON, RETICCTPCT in the last 72 hours. Urine analysis:    Component Value Date/Time   COLORURINE YELLOW 11/08/2016 2136   APPEARANCEUR CLOUDY (A) 11/08/2016 2136   LABSPEC 1.017 11/08/2016 2136   PHURINE 6.0 11/08/2016 2136   GLUCOSEU NEGATIVE 11/08/2016 2136   HGBUR NEGATIVE 11/08/2016 2136   BILIRUBINUR NEGATIVE 11/08/2016 2136   KETONESUR NEGATIVE 11/08/2016 2136   PROTEINUR NEGATIVE 11/08/2016 2136   UROBILINOGEN 0.2 10/18/2014 2310   NITRITE NEGATIVE 11/08/2016 2136   LEUKOCYTESUR LARGE (A) 11/08/2016 2136   Sepsis Labs: !!!!!!!!!!!!!!!!!!!!!!!!!!!!!!!!!!!!!!!!!!!! @LABRCNTIP (procalcitonin:4,lacticidven:4) )No results found for this or any previous visit (from the past 240 hour(s)).   Radiological Exams on Admission: Dg Chest Portable 1 View  Result Date: 03/07/2017 CLINICAL DATA:  Shortness of breath.  History of sarcoidosis. EXAM: PORTABLE CHEST 1 VIEW COMPARISON:  Chest radiograph and chest CT November 24, 2016 FINDINGS: Upper lobe predominant fibrosis remains with cicatrization in the upper lobes. There is no evident new opacity. Heart is mildly enlarged but stable. There is evidence of pulmonary arterial hypertension, better delineated on recent CT. There is aortic atherosclerosis. No adenopathy is appreciable by radiography. No evident bone lesions. IMPRESSION: Upper lobe fibrosis and cicatrization, likely due to history of sarcoidosis. There is cardiomegaly with pulmonary arterial hypertension. Question a degree of underlying cor pulmonale. There is aortic atherosclerosis. No new parenchymal lung opacity. No adenopathy evident. Pulmonary arterial hypertension could obscure smaller hilar lymph nodes on radiography. Aortic Atherosclerosis (ICD10-I70.0). Electronically Signed   By: Bretta Bang III M.D.   On: 02/10/2017 11:42    cxr reviewed no infiltrate or edema Old chart reviewed Case  discussed with edp  Assessment/Plan 84 yo female with dementia, aspiration risk, bedbound state comes in with sob, hypoxia likely due to HCAP Principal Problem:   Acute respiratory failure with hypoxia (HCC)- supplental oxygen prn.  Wean as tolerates.  Treat with abx for clinical HCAP with iv zosyn and vanc.  Obtain sputum and blood cx.    Active Problems:   Sarcoidosis- noted, iv solumedrol   Chronic kidney disease, stage III (moderate) (HCC)- stable at baseline   Failure to thrive in adult- noted   Cognitive impairment- noted   Anemia, chronic disease- at baseline hgb level mid 7   Chronic pain syndrome- noted  Chronic CHF (congestive heart failure) (HCC)- noted, cont home lasix dosing   UTI (urinary tract infection)- abx as above, was on rocephin  If declines, consider hospice evaluation.  DVT prophylaxis:  lovenox Code Status:  DNR Family Communication:  daughter Disposition Plan:  Per day team Consults called:  none Admission status:  Admission      Auria Mckinlay A MD [facility]  If 7PM-7AM, please contact night-coverage [email or web address] Password [email or web address]  22-Feb-2017, 4:05 PM

## 2017-02-17 NOTE — Progress Notes (Signed)
A consult was received from an ED physician for Vancomycin and Zosyn per pharmacy dosing.  The patient's profile has been reviewed for ht/wt/allergies/indication/available labs. A one time order has been placed for the above antibiotics.  Further antibiotics/pharmacy consults should be ordered by admitting physician if indicated.                       Thank you, Bernadene Personrew Jamason Peckham, PharmD, BCPS Pager: [phone] 03/11/2017, 11:00 AM

## 2017-02-17 NOTE — ED Notes (Signed)
Jake, RN at bedside.  

## 2017-02-17 NOTE — ED Triage Notes (Signed)
Per EMS[facility]/low O2 saturation. Pt is currently on abx for UTI. Staff noticed her O@ was 80% on RA. Lung sounds are clear. Pt placed on [place] 3L, which brought sats to 98%.

## 2017-02-17 NOTE — ED Provider Notes (Addendum)
[facility] Provider Note   CSN: [id number] Arrival date & time: 03/08/2017  1009     History   Chief Complaint Chief Complaint  Patient presents with  . low O2 saturation    HPI Elizabeth Keller is a 84 y.o. female.  HPI  84 year old female with past medical history as below who presents with shortness of breath.  The patient has a history of mild dementia and presents from a facility.  She reports that she has been mildly more short of breath than usual for the last several days.  She is otherwise without complaints.  She denies any chest pain.  Denies any fevers or chills.  She initially denied cough, but upon questioning again, does endorse that she has had some cough productive of yellow-green sputum.  No abdominal pain.  Her appetite is been normal.  She is currently hungry.  Denies any change in her appetite.  Level 5 caveat invoked as remainder of history, ROS, and physical exam limited due to patient's dementia.   Past Medical History:  Diagnosis Date  . Anxiety disorder   . Arthritis   . Asthma   . Chronic kidney disease   . Degenerative disk disease   . Diabetes mellitus   . GERD (gastroesophageal reflux disease)   . Hypercholesteremia   . Hypertension   . IBS (irritable bowel syndrome)   . Sarcoidosis   . Sepsis (HCC) 06/29/2016  . Spinal stenosis     Patient Active Problem List   Diagnosis Date Noted  . Acute respiratory failure with hypoxia (HCC) 03/02/2017  . Sarcoidosis   . HCAP (healthcare-associated pneumonia)   . Sepsis secondary to UTI (HCC) 11/09/2016  . Altered mental status 11/08/2016  . Hypernatremia 11/08/2016  . UTI (urinary tract infection) 11/08/2016  . Chronic CHF (congestive heart failure) (HCC) 10/15/2016  . Spinal stenosis, site unspecified 10/06/2016  . Chronic pain syndrome 10/06/2016  . ARF (acute renal failure) (HCC) 09/30/2016  . Osteoarthritis of multiple joints 09/03/2016  . Right wrist  fracture, sequela 09/03/2016  . Swelling of left hand 08/31/2016  . Cognitive impairment 08/14/2016  . Anemia, chronic disease 08/14/2016  . GERD (gastroesophageal reflux disease) 08/13/2016  . Stage IV pressure ulcer of sacral region (HCC) 07/08/2016  . Decubitus ulcer of heel, bilateral, unstageable (HCC) 07/08/2016  . Dyslipidemia associated with type 2 diabetes mellitus (HCC) 07/08/2016  . Failure to thrive in adult 07/08/2016  . Weight loss, non-intentional 07/08/2016  . Anxiety disorder 07/08/2016  . Severe protein-calorie malnutrition (HCC) 06/30/2016  . Sepsis (HCC) 06/29/2016  . Severe sepsis (HCC) 05/15/2016  . Type II diabetes mellitus with renal manifestations (HCC) 04/04/2016  . Chronic kidney disease, stage III (moderate) (HCC) 03/14/2016    Past Surgical History:  Procedure Laterality Date  . EYE SURGERY  2017   cataract / glaucoma    OB History    No data available       Home Medications    Prior to Admission medications   Medication Sig Start Date End Date Taking? Authorizing Provider  acetaminophen (TYLENOL) 500 MG tablet Take 1,000 mg by mouth every 8 (eight) hours as needed for moderate pain.    Yes [provider]  albuterol (PROVENTIL) (2.5 MG/3ML) 0.083% nebulizer solution Take 2.5 mg by nebulization every 6 (six) hours as needed for wheezing or shortness of breath.   Yes [provider]  allopurinol (ZYLOPRIM) 100 MG tablet Take 50 mg by mouth daily.   Yes [provider]  Amino Acids-Protein Hydrolys (FEEDING SUPPLEMENT, PRO-STAT SUGAR FREE 64,) LIQD Take 30 mLs by mouth 3 (three) times daily with meals.   Yes [provider]  baclofen (LIORESAL) 10 MG tablet Take 10 mg by mouth 2 (two) times daily.   Yes [provider]  bisacodyl (DULCOLAX) 10 MG suppository Place 1 suppository (10 mg total) rectally daily as needed for moderate constipation. 05/20/16  Yes Sheikh, Omair Latif, DO  busPIRone (BUSPAR) 10 MG  tablet Take 10 mg by mouth 3 (three) times daily.   Yes [provider]  cefTRIAXone (ROCEPHIN) 1 g injection Inject 1 g into the muscle daily.   Yes [provider]  collagenase (SANTYL) ointment Apply topically daily. Apply to sacrum and left heel wounds daily Patient taking differently: Apply 1 application topically daily. Apply to bilateral heel wounds daily 10/05/16  Yes Osvaldo ShipperKrishnan, Gokul, MD  diclofenac sodium (VOLTAREN) 1 % GEL APPLY 2 GRAMS EXTERNALLY TO THE AFFECTED AREA FOUR TIMES DAILY AS NEEDED FOR PAIN 06/26/16  Yes Sandford Craze'Sullivan, Melissa, NP  furosemide (LASIX) 20 MG tablet Take 1 tablet (20 mg total) by mouth daily as needed for edema. Patient taking differently: Take 20 mg by mouth daily.  11/12/16  Yes Kathlen ModyAkula, Vijaya, MD  megestrol (MEGACE) 40 MG/ML suspension Take 800 mg by mouth daily.   Yes [provider]  Menthol (ICY HOT) 5 % PTCH Apply 1 patch topically daily. Apply 1 patch to lower back daily in the morning, remove after 12 hours   Yes [provider]  Multiple Vitamin (MULTIVITAMIN WITH MINERALS) TABS tablet Take 1 tablet by mouth daily.   Yes [provider]  omeprazole (PRILOSEC) 40 MG capsule Take 40 mg by mouth daily.    Yes [provider]  polyethylene glycol (MIRALAX / GLYCOLAX) packet Take 17 g by mouth daily. 10/06/16  Yes Osvaldo ShipperKrishnan, Gokul, MD  Probiotic Product (PROBIOTIC-10 PO) Take 1 tablet by mouth 2 (two) times daily.   Yes [provider]  clonazePAM (KLONOPIN) 0.5 MG tablet Take 0.5 tablets (0.25 mg total) by mouth 2 (two) times daily as needed for anxiety. Patient not taking: Reported on 03/06/2017 11/12/16   Kathlen ModyAkula, Vijaya, MD  feeding supplement, ENSURE ENLIVE, (ENSURE ENLIVE) LIQD Take 237 mLs by mouth 2 (two) times daily between meals. Patient not taking: Reported on 03/02/2017 11/12/16   Kathlen ModyAkula, Vijaya, MD  fluconazole (DIFLUCAN) 200 MG tablet Take 1 tablet (200 mg total) by mouth daily. Patient not taking:  Reported on 02/16/2017 11/13/16   Kathlen ModyAkula, Vijaya, MD  Maltodextrin-Xanthan Gum [facility](RESOURCE THICKENUP CLEAR) POWD As needed with each meal Patient not taking: Reported on 03/01/2017 11/12/16   Kathlen ModyAkula, Vijaya, MD  potassium chloride SA (K-DUR,KLOR-CON) 20 MEQ tablet Take 1 tablet (20 mEq total) by mouth daily. Patient not taking: Reported on 02/24/2017 10/05/16   Osvaldo ShipperKrishnan, Gokul, MD  traMADol (ULTRAM) 50 MG tablet Take 1 tablet (50 mg total) by mouth every 6 (six) hours as needed. Patient not taking: Reported on 02/09/2017 10/05/16   Osvaldo ShipperKrishnan, Gokul, MD    Family History Family History  Problem Relation Age of Onset  . Hypertension Other     Social History Social History   Tobacco Use  . Smoking status: Former Games developermoker  . Smokeless tobacco: Never Used  Substance Use Topics  . Alcohol use: No  . Drug use: No     Allergies   Dicyclomine; Gabapentin; Metformin and related; Lyrica [pregabalin]; and Topiramate   Review of Systems Review of  Systems  Constitutional: Positive for fatigue.  Respiratory: Positive for shortness of breath and wheezing.   Neurological: Positive for weakness.  All other systems reviewed and are negative.    Physical Exam Updated Vital Signs BP (!) 102/51   Pulse (!) 127   Temp (!) 96.6 F (35.9 C) (Rectal) Comment: Warm blankets applied.   Resp (!) 35   SpO2 100%   Physical Exam  Constitutional: She is oriented to person, place, and time. She appears well-developed and well-nourished. No distress.  HENT:  Head: Normocephalic and atraumatic.  Eyes: Conjunctivae are normal.  Neck: Neck supple.  Cardiovascular: Normal rate, regular rhythm and normal heart sounds. Exam reveals no friction rub.  No murmur heard. Pulmonary/Chest: Accessory muscle usage present. Tachypnea noted. No respiratory distress. She has decreased breath sounds. She has wheezes in the right middle field, the right lower field, the left middle field and the left lower field. She has rhonchi in  the right lower field and the left lower field. She has no rales.  Abdominal: She exhibits no distension.  Musculoskeletal: She exhibits no edema.  Neurological: She is alert and oriented to person, place, and time. She exhibits normal muscle tone.  Skin: Skin is warm. Capillary refill takes less than 2 seconds.  Psychiatric: She has a normal mood and affect.  Nursing note and vitals reviewed.   Emergency Ultrasound Study:   Angiocath insertion Performed by: Dollene Cleveland Consent: Verbal consent/emergent consent obtained. Risks and benefits: risks, benefits and alternatives were discussed Immediately prior to procedure the correct patient, procedure, equipment, support staff and site/side marked as needed. Indication: difficult IV access Preparation: Patient was prepped and draped in the usual sterile fashion. Sterile gel was used for this procedure and the ultrasound probe was sterilized prior to use. Vein Location: Right forearm vein was visualized during assessment for potential access sites and was found to be patent/ easily compressed with linear ultrasound.  The needle was visualized with real-time ultrasound and guided into the vein. Gauge: 20 Image saved and stored.  Normal blood return.   Patient tolerance: Patient tolerated the procedure well with no immediate complications.      ED Treatments / Results  Labs (all labs ordered are listed, but only abnormal results are displayed) Labs Reviewed  CBC WITH DIFFERENTIAL/PLATELET - Abnormal; Notable for the following components:      Result Value   WBC 12.9 (*)    RBC 2.61 (*)    Hemoglobin 7.3 (*)    HCT 24.3 (*)    RDW 23.3 (*)    Platelets 421 (*)    Neutro Abs 10.9 (*)    Lymphs Abs 0.6 (*)    Monocytes Absolute 1.3 (*)    All other components within normal limits  COMPREHENSIVE METABOLIC PANEL - Abnormal; Notable for the following components:   Glucose, Bld 131 (*)    BUN 39 (*)    Creatinine, Ser 1.04 (*)     Total Protein 5.9 (*)    Albumin 2.2 (*)    ALT 5 (*)    GFR calc non Af Amer 46 (*)    GFR calc Af Amer 53 (*)    All other components within normal limits  BRAIN NATRIURETIC PEPTIDE - Abnormal; Notable for the following components:   B Natriuretic Peptide 692.9 (*)    All other components within normal limits  TROPONIN I - Abnormal; Notable for the following components:   Troponin I 0.04 (*)    All other  components within normal limits  CBC - Abnormal; Notable for the following components:   WBC 16.2 (*)    RBC 3.13 (*)    Hemoglobin 8.8 (*)    HCT 29.8 (*)    MCHC 29.5 (*)    RDW 23.7 (*)    Platelets 410 (*)    All other components within normal limits  CREATININE, SERUM - Abnormal; Notable for the following components:   Creatinine, Ser 1.11 (*)    GFR calc non Af Amer 42 (*)    GFR calc Af Amer 49 (*)    All other components within normal limits  CULTURE, BLOOD (ROUTINE X 2)  CULTURE, BLOOD (ROUTINE X 2)  CULTURE, EXPECTORATED SPUTUM-ASSESSMENT  INFLUENZA PANEL BY PCR (TYPE A & B)  URINALYSIS, ROUTINE W REFLEX MICROSCOPIC  LACTIC ACID, PLASMA  LACTIC ACID, PLASMA  BASIC METABOLIC PANEL  CBC  I-STAT CG4 LACTIC ACID, ED    EKG  EKG Interpretation  Date/Time:  Wednesday February 17 2017 15:33:33 EST Ventricular Rate:  80 PR Interval:    QRS Duration: 154 QT Interval:  386 QTC Calculation: 446 R Axis:   -74 Text Interpretation:  Atrial fibrillation Ventricular premature complex RBBB and LAFB LVH with secondary repolarization abnormality Artifact in lead(s) I II III aVR aVL aVF V1 Since last EKG, there appears to be new onset atrial fibrillation Confirmed by Shaune Pollack [phone]) on 02/14/2017 3:38:32 PM       Radiology Dg Chest Portable 1 View  Result Date: 02/12/2017 CLINICAL DATA:  Shortness of breath.  History of sarcoidosis. EXAM: PORTABLE CHEST 1 VIEW COMPARISON:  Chest radiograph and chest CT November 24, 2016 FINDINGS: Upper lobe predominant fibrosis  remains with cicatrization in the upper lobes. There is no evident new opacity. Heart is mildly enlarged but stable. There is evidence of pulmonary arterial hypertension, better delineated on recent CT. There is aortic atherosclerosis. No adenopathy is appreciable by radiography. No evident bone lesions. IMPRESSION: Upper lobe fibrosis and cicatrization, likely due to history of sarcoidosis. There is cardiomegaly with pulmonary arterial hypertension. Question a degree of underlying cor pulmonale. There is aortic atherosclerosis. No new parenchymal lung opacity. No adenopathy evident. Pulmonary arterial hypertension could obscure smaller hilar lymph nodes on radiography. Aortic Atherosclerosis (ICD10-I70.0). Electronically Signed   By: Bretta Bang III M.D.   On: 03/11/2017 11:42    Procedures .Critical Care Performed by: Shaune Pollack, MD Authorized by: Shaune Pollack, MD   Critical care provider statement:    Critical care time (minutes):  35   Critical care time was exclusive of:  Separately billable procedures and treating other patients and teaching time   Critical care was necessary to treat or prevent imminent or life-threatening deterioration of the following conditions:  Circulatory failure, sepsis and respiratory failure   Critical care was time spent personally by me on the following activities:  Development of treatment plan with patient or surrogate, discussions with consultants, evaluation of patient's response to treatment, examination of patient, obtaining history from patient or surrogate, ordering and performing treatments and interventions, ordering and review of laboratory studies, ordering and review of radiographic studies, pulse oximetry, re-evaluation of patient's condition and review of old charts   I assumed direction of critical care for this patient from another provider in my specialty: no     (including critical care time)  Medications Ordered in ED Medications    RESOURCE THICKENUP CLEAR ( Oral Given 02/24/2017 1514)  piperacillin-tazobactam (ZOSYN) IVPB 3.375 g (3.375 g Intravenous  Not Given 03/07/2017 1529)  vancomycin (VANCOCIN) IVPB 1000 mg/200 mL premix (not administered)  albuterol (PROVENTIL) (2.5 MG/3ML) 0.083% nebulizer solution 2.5 mg (not administered)  acetaminophen (TYLENOL) tablet 1,000 mg (not administered)  baclofen (LIORESAL) tablet 10 mg (not administered)  busPIRone (BUSPAR) tablet 10 mg (not administered)  polyethylene glycol (MIRALAX / GLYCOLAX) packet 17 g (not administered)  enoxaparin (LOVENOX) injection 30 mg (not administered)  sodium chloride flush (NS) 0.9 % injection 3 mL (not administered)  sodium chloride flush (NS) 0.9 % injection 3 mL (not administered)  0.9 %  sodium chloride infusion (not administered)  ondansetron (ZOFRAN) tablet 4 mg (not administered)    Or  ondansetron (ZOFRAN) injection 4 mg (not administered)  albuterol (PROVENTIL) (2.5 MG/3ML) 0.083% nebulizer solution 2.5 mg (not administered)  ipratropium (ATROVENT) nebulizer solution 0.5 mg (not administered)  methylPREDNISolone sodium succinate (SOLU-MEDROL) 125 mg/2 mL injection 80 mg (not administered)  sodium chloride 0.9 % bolus 1,000 mL (not administered)  ipratropium-albuterol (DUONEB) 0.5-2.5 (3) MG/3ML nebulizer solution 3 mL (3 mLs Nebulization Given 02/23/2017 1424)  sodium chloride 0.9 % bolus 1,000 mL (1,000 mLs Intravenous New Bag/Given 02/26/2017 1414)  methylPREDNISolone sodium succinate (SOLU-MEDROL) 125 mg/2 mL injection 125 mg (125 mg Intravenous Given 02/27/2017 1502)  ipratropium-albuterol (DUONEB) 0.5-2.5 (3) MG/3ML nebulizer solution 3 mL (3 mLs Nebulization Given 03/07/2017 1538)  doxycycline (VIBRAMYCIN) 100 mg in dextrose 5 % 250 mL IVPB (0 mg Intravenous Stopped 02/22/2017 1743)     Initial Impression / Assessment and Plan / ED Course  I have reviewed the triage vital signs and the nursing notes.  Pertinent labs & imaging results that were available  during my care of the patient were reviewed by me and considered in my medical decision making (see chart for details).   84 year old female with history of sarcoidosis here with reported cough and decreased level of consciousness.  Patient currently lives in a skilled nursing facility.  She is reportedly satting 80% today and EMS was called.  She improved with 3 L nasal cannula.  She has had a wet, "junky" cough in her facility.  On arrival here, she does have productive cough.  She has a moderate leukocytosis, chronic anemia, and likely mild dehydration based on CMP.  Patient given fluids, as well as doxycycline for empiric coverage given sputum production and known chronic lung disease, and steroids.  I have also given her vancomycin and Zosyn for broad-spectrum coverage.  She is improved with breathing treatments.  Will admit for new onset hypoxia with cough, possibly PNA. She also has a chronic decub, but at baseline per facility.  ADDENDUM: New-onset AFib noted in ED. Hospitalist aware. CHADS-VASC score is 6. Chronically anemic so will need discussion re: pros and cons of anticoagulation. Hgb 7.3 today, which is at/near her baseline. No signs of actve bleeding.  Final Clinical Impressions(s) / ED Diagnoses   Final diagnoses:  Hypoxia  HCAP (healthcare-associated pneumonia)  New onset atrial fibrillation [facility])    ED Discharge Orders    None       Shaune Pollack, MD 02/10/2017 1529    Shaune Pollack, MD 03/05/2017 Princess Perna    Shaune Pollack, MD 03/01/17 1113

## 2017-02-17 NOTE — ED Notes (Signed)
TWO UNSUCCESSFUL ATTEMPTS TO COLLECT BLOOD CULTURES AND LABS

## 2017-02-17 NOTE — ED Notes (Signed)
Clamped foley catheter for urine sample.

## 2017-02-17 NOTE — ED Notes (Signed)
Dr. Marcello MooresIsaac gave permission for patient to eat and family is reporting she needs thicker for food/liquids. Placed an order.

## 2017-02-18 DIAGNOSIS — N3000 Acute cystitis without hematuria: Secondary | ICD-10-CM

## 2017-02-18 DIAGNOSIS — R0902 Hypoxemia: Secondary | ICD-10-CM

## 2017-02-18 DIAGNOSIS — R4189 Other symptoms and signs involving cognitive functions and awareness: Secondary | ICD-10-CM

## 2017-02-18 DIAGNOSIS — D869 Sarcoidosis, unspecified: Secondary | ICD-10-CM

## 2017-02-18 DIAGNOSIS — N183 Chronic kidney disease, stage 3 (moderate): Secondary | ICD-10-CM

## 2017-02-18 DIAGNOSIS — I4891 Unspecified atrial fibrillation: Secondary | ICD-10-CM

## 2017-02-18 DIAGNOSIS — I509 Heart failure, unspecified: Secondary | ICD-10-CM

## 2017-02-18 DIAGNOSIS — G894 Chronic pain syndrome: Secondary | ICD-10-CM

## 2017-02-18 DIAGNOSIS — J189 Pneumonia, unspecified organism: Secondary | ICD-10-CM

## 2017-02-18 DIAGNOSIS — R6521 Severe sepsis with septic shock: Secondary | ICD-10-CM

## 2017-02-18 DIAGNOSIS — D638 Anemia in other chronic diseases classified elsewhere: Secondary | ICD-10-CM

## 2017-02-18 DIAGNOSIS — J9601 Acute respiratory failure with hypoxia: Secondary | ICD-10-CM

## 2017-02-18 DIAGNOSIS — A419 Sepsis, unspecified organism: Secondary | ICD-10-CM | POA: Diagnosis present

## 2017-02-18 DIAGNOSIS — R627 Adult failure to thrive: Secondary | ICD-10-CM

## 2017-02-18 LAB — LACTIC ACID, PLASMA: LACTIC ACID, VENOUS: 2 mmol/L — AB (ref 0.5–1.9)

## 2017-02-18 LAB — BASIC METABOLIC PANEL
Anion gap: 5 (ref 5–15)
BUN: 40 mg/dL — AB (ref 6–20)
CHLORIDE: 113 mmol/L — AB (ref 101–111)
CO2: 29 mmol/L (ref 22–32)
CREATININE: 1.29 mg/dL — AB (ref 0.44–1.00)
Calcium: 8.8 mg/dL — ABNORMAL LOW (ref 8.9–10.3)
GFR calc non Af Amer: 35 mL/min — ABNORMAL LOW (ref >60)
GFR, EST AFRICAN AMERICAN: 41 mL/min — AB (ref >60)
Glucose, Bld: 245 mg/dL — ABNORMAL HIGH (ref 65–99)
Potassium: 5.1 mmol/L (ref 3.5–5.1)
SODIUM: 147 mmol/L — AB (ref 135–145)

## 2017-02-18 LAB — URINALYSIS, ROUTINE W REFLEX MICROSCOPIC
BILIRUBIN URINE: NEGATIVE
GLUCOSE, UA: NEGATIVE mg/dL
Ketones, ur: 5 mg/dL — AB
NITRITE: NEGATIVE
Protein, ur: 30 mg/dL — AB
SPECIFIC GRAVITY, URINE: 1.024 (ref 1.005–1.030)
pH: 5 (ref 5.0–8.0)

## 2017-02-18 LAB — CBC
HCT: 31.2 % — ABNORMAL LOW (ref 36.0–46.0)
Hemoglobin: 8.8 g/dL — ABNORMAL LOW (ref 12.0–15.0)
MCH: 28 pg (ref 26.0–34.0)
MCHC: 28.2 g/dL — ABNORMAL LOW (ref 30.0–36.0)
MCV: 99.4 fL (ref 78.0–100.0)
PLATELETS: 402 10*3/uL — AB (ref 150–400)
RBC: 3.14 MIL/uL — AB (ref 3.87–5.11)
RDW: 23.6 % — AB (ref 11.5–15.5)
WBC: 16.4 10*3/uL — AB (ref 4.0–10.5)

## 2017-02-18 MED ORDER — VANCOMYCIN HCL 500 MG IV SOLR: 500.0000 mg | INTRAVENOUS | Status: DC

## 2017-02-18 MED ORDER — SODIUM CHLORIDE 0.9 % IV BOLUS (SEPSIS)
500.0000 mL | Freq: Once | INTRAVENOUS | Status: AC
  Administered 2017-02-18: 500 mL via INTRAVENOUS

## 2017-02-18 MED ORDER — PIPERACILLIN-TAZOBACTAM 3.375 G IVPB 30 MIN
3.3750 g | INTRAVENOUS | Status: AC
  Administered 2017-02-18: 3.375 g via INTRAVENOUS
  Filled 2017-02-18: qty 50

## 2017-02-18 MED ORDER — PIPERACILLIN-TAZOBACTAM 3.375 G IVPB
3.3750 g | Freq: Three times a day (TID) | INTRAVENOUS | Status: DC
  Administered 2017-02-18: 3.375 g via INTRAVENOUS
  Filled 2017-02-18 (×2): qty 50

## 2017-02-18 MED ORDER — METHYLPREDNISOLONE SODIUM SUCC 125 MG IJ SOLR
80.0000 mg | Freq: Two times a day (BID) | INTRAMUSCULAR | Status: DC
  Administered 2017-02-18: 80 mg via INTRAVENOUS
  Filled 2017-02-18 (×2): qty 2

## 2017-02-18 NOTE — Consult Note (Signed)
Consultation Note Date: 03/15/17   Patient Name: Elizabeth Keller  DOB: 1923/06/30  MRN: [id number]  Age / Sex: 84 y.o., female  PCP: Debbrah Alar, NP Referring Physician: Patrecia Pour, MD  Reason for Consultation: Establishing goals of care  HPI/Patient Profile: 84 y.o. female   admitted on 03/06/2017   Elizabeth Keller is a 84 y.o. female with a history of dementia, brain bleed s/p fall in Jan 2018, bedbound since with decubitus ulcers, sarcoidosis, and recurrent UTIs who presented from [facility] 1/9 with lethargy and hypoxia. She has had a cough, per report, and become more consistently drowsy over the past few days despite undergoing IM ceftriaxone for UTI. She was admitted yesterday for presumed HCAP based on new hypoxia and cough, though 1v CXR showed no focal infiltrates. Cultures obtained, IV steroids, 2L NS, and IV antibiotics given and the patient put in for a bed in the SDU. DNR was confirmed at that time. She has had limited urine output and declining blood pressures, growing less responsive.   Clinical Assessment and Goals of Care: Patient was seen by PMT in previous hospitalization over the summer in 2018. I met with the patient and her family, including son Elizabeth Keller and daughter Elizabeth Keller.   I introduced myself and palliative care as follows: Palliative medicine is specialized medical care for people living with serious illness. It focuses on providing relief from the symptoms and stress of a serious illness. The goal is to improve quality of life for both the patient and the family.  The patient's daughter Elizabeth Keller is tearful, and patient's son Elizabeth Keller are both appreciative of the information they have received from hospital staff. We reviewed end of life signs and symptoms, sepsis and likely sources, possibility of prognosis being limited.   Patient's son and daughter state, " she is a tough lady, we've been told  she's not going to make it 3 times in the past. We know she is very sick, but we're going to keep praying that she pulls through." Offered active listening and supportive care to family.    HCPOA  son Elizabeth Keller and daughter Elizabeth Keller are both co HCPOA agents. There are 6 children, most of whom are present at the bedside, save for one son who is on his way from [place] on [place], [place]. Patient has several grand as well as great grand children.   SUMMARY OF RECOMMENDATIONS    DNR DNI Patient likely at end of life, with prognosis hours to some very limited number of days discussed frankly and compassionately with large family present at bedside.  Continue gentle medical measures.  Chaplain consult to support family Comfort cart for family  Code Status/Advance Care Planning:  DNR    Symptom Management:    as above   Palliative Prophylaxis:   Bowel Regimen   Psycho-social/Spiritual:   Desire for further Chaplaincy support:yes  Additional Recommendations: Caregiving  Support/Resources  Prognosis:   Hours - Days  Discharge Planning: Anticipated Hospital Death      Primary Diagnoses: Present on Admission: . Acute  respiratory failure with hypoxia ([place]) . Anemia, chronic disease . Chronic kidney disease, stage III (moderate) (HCC) . Chronic pain syndrome . Cognitive impairment . Failure to thrive in adult . UTI (urinary tract infection) . Sarcoidosis . Septic shock ([place])   I have reviewed the medical record, interviewed the patient and family, and examined the patient. The following aspects are pertinent.  Past Medical History:  Diagnosis Date  . Anxiety disorder   . Arthritis   . Asthma   . Chronic kidney disease   . Degenerative disk disease   . Diabetes mellitus   . GERD (gastroesophageal reflux disease)   . Hypercholesteremia   . Hypertension   . IBS (irritable bowel syndrome)   . Sarcoidosis   . Sepsis ([place]) 06/29/2016  . Spinal stenosis    Social History    Socioeconomic History  . Marital status: Divorced    Spouse name: None  . Number of children: None  . Years of education: None  . Highest education level: None  Social Needs  . Financial resource strain: None  . Food insecurity - worry: None  . Food insecurity - inability: None  . Transportation needs - medical: None  . Transportation needs - non-medical: None  Occupational History  . None  Tobacco Use  . Smoking status: Former Research scientist (life sciences)  . Smokeless tobacco: Never Used  Substance and Sexual Activity  . Alcohol use: No  . Drug use: No  . Sexual activity: None  Other Topics Concern  . None  Social History Narrative  . None   Family History  Problem Relation Age of Onset  . Hypertension Other    Scheduled Meds: . baclofen  10 mg Oral BID  . busPIRone  10 mg Oral TID  . enoxaparin (LOVENOX) injection  30 mg Subcutaneous Q24H  . ipratropium-albuterol  3 mL Nebulization TID  . methylPREDNISolone (SOLU-MEDROL) injection  80 mg Intravenous Q12H  . polyethylene glycol  17 g Oral Daily  . sodium chloride flush  3 mL Intravenous Q12H   Continuous Infusions: . sodium chloride    . piperacillin-tazobactam (ZOSYN)  IV    . [START ON 03/20/17] vancomycin     PRN Meds:.sodium chloride, acetaminophen, albuterol, ondansetron **OR** ondansetron (ZOFRAN) IV, RESOURCE THICKENUP CLEAR, sodium chloride flush Medications Prior to Admission:  Prior to Admission medications   Medication Sig Start Date End Date Taking? Authorizing Provider  acetaminophen (TYLENOL) 500 MG tablet Take 1,000 mg by mouth every 8 (eight) hours as needed for moderate pain.    Yes [provider]  albuterol (PROVENTIL) (2.5 MG/3ML) 0.083% nebulizer solution Take 2.5 mg by nebulization every 6 (six) hours as needed for wheezing or shortness of breath.   Yes [provider]  allopurinol (ZYLOPRIM) 100 MG tablet Take 50 mg by mouth daily.   Yes [provider]  Amino Acids-Protein Hydrolys  (FEEDING SUPPLEMENT, PRO-STAT SUGAR FREE 64,) LIQD Take 30 mLs by mouth 3 (three) times daily with meals.   Yes [provider]  baclofen (LIORESAL) 10 MG tablet Take 10 mg by mouth 2 (two) times daily.   Yes [provider]  bisacodyl (DULCOLAX) 10 MG suppository Place 1 suppository (10 mg total) rectally daily as needed for moderate constipation. 05/20/16  Yes Sheikh, Omair Latif, DO  busPIRone (BUSPAR) 10 MG tablet Take 10 mg by mouth 3 (three) times daily.   Yes [provider]  cefTRIAXone (ROCEPHIN) 1 g injection Inject 1 g into the muscle daily.   Yes [provider]  collagenase (SANTYL) ointment Apply topically daily. Apply to sacrum and left heel wounds daily Patient taking differently: Apply 1 application topically daily. Apply to bilateral heel wounds daily 10/05/16  Yes Bonnielee Haff, MD  diclofenac sodium (VOLTAREN) 1 % GEL APPLY 2 GRAMS EXTERNALLY TO THE AFFECTED AREA FOUR TIMES DAILY AS NEEDED FOR PAIN 06/26/16  Yes Debbrah Alar, NP  furosemide (LASIX) 20 MG tablet Take 1 tablet (20 mg total) by mouth daily as needed for edema. Patient taking differently: Take 20 mg by mouth daily.  11/12/16  Yes Hosie Poisson, MD  megestrol (MEGACE) 40 MG/ML suspension Take 800 mg by mouth daily.   Yes [provider]  Menthol (ICY HOT) 5 % PTCH Apply 1 patch topically daily. Apply 1 patch to lower back daily in the morning, remove after 12 hours   Yes [provider]  Multiple Vitamin (MULTIVITAMIN WITH MINERALS) TABS tablet Take 1 tablet by mouth daily.   Yes [provider]  omeprazole (PRILOSEC) 40 MG capsule Take 40 mg by mouth daily.    Yes [provider]  polyethylene glycol (MIRALAX / GLYCOLAX) packet Take 17 g by mouth daily. 10/06/16  Yes Bonnielee Haff, MD  Probiotic Product (PROBIOTIC-10 PO) Take 1 tablet by mouth 2 (two) times daily.   Yes [provider]  clonazePAM (KLONOPIN) 0.5 MG tablet Take 0.5  tablets (0.25 mg total) by mouth 2 (two) times daily as needed for anxiety. Patient not taking: Reported on 02/20/2017 11/12/16   Hosie Poisson, MD  feeding supplement, ENSURE ENLIVE, (ENSURE ENLIVE) LIQD Take 237 mLs by mouth 2 (two) times daily between meals. Patient not taking: Reported on 03/11/2017 11/12/16   Hosie Poisson, MD  fluconazole (DIFLUCAN) 200 MG tablet Take 1 tablet (200 mg total) by mouth daily. Patient not taking: Reported on 02/13/2017 11/13/16   Hosie Poisson, MD  Maltodextrin-Xanthan Gum ([place]) POWD As needed with each meal Patient not taking: Reported on 03/02/2017 11/12/16   Hosie Poisson, MD  potassium chloride SA (K-DUR,KLOR-CON) 20 MEQ tablet Take 1 tablet (20 mEq total) by mouth daily. Patient not taking: Reported on 03/11/2017 10/05/16   Bonnielee Haff, MD  traMADol (ULTRAM) 50 MG tablet Take 1 tablet (50 mg total) by mouth every 6 (six) hours as needed. Patient not taking: Reported on 02/24/2017 10/05/16   Bonnielee Haff, MD   Allergies  Allergen Reactions  . Dicyclomine Other (See Comments)    Reaction:  Dizziness   . Gabapentin Other (See Comments)    Reaction:  Dizziness   . Metformin And Related Nausea And Vomiting  . Lyrica [Pregabalin] Other (See Comments)    Reaction:  Unknown  . Topiramate Other (See Comments)    Reaction:  Unknown   Review of Systems Non verbal  Physical Exam Unresponsive Disoriented Shallow regular breathing Pale skin S1 S2 Abdomen soft Thin elderly lady, muscle wasting  Vital Signs: BP (!) 63/32 (BP Location: Right Arm)   Pulse 61   Temp (!) 96.6 F (35.9 C) (Rectal) Comment: Warm blankets applied.   Resp (!) 24   Wt 44 kg (97 lb)   SpO2 95%   BMI 16.65 kg/m  Pain Assessment: Faces       SpO2: SpO2: 95 % O2 Device:SpO2: 95 % O2 Flow Rate: .O2 Flow Rate (L/min): 2 L/min  IO: Intake/output summary:   Intake/Output Summary (Last 24 hours) at  1610 Last data filed at  1500 Gross per  24 hour  Intake 2703  ml  Output 0 ml  Net 2703 ml    LBM: Last BM Date:  Baseline Weight: Weight: 44 kg (97 lb) Most recent weight: Weight: 44 kg (97 lb)     Palliative Assessment/Data:   PPS 10%  Time In:  1500 Time Out:  1610 Time Total:  70 min  Greater than 50%  of this time was spent counseling and coordinating care related to the above assessment and plan.  Signed by: Loistine Chance, MD  [phone] Please contact Palliative Medicine Team phone at [phone] for questions and concerns.  For individual provider: See Shea Evans

## 2017-02-18 NOTE — ED Notes (Signed)
Pt's blood pressure 68/40 (49). Hospitalist Dr. Jarvis NewcomerGrunz made aware and he will be down to ED to see patient momentarily.

## 2017-02-18 NOTE — ED Notes (Signed)
Respiratory at bedside.

## 2017-02-18 NOTE — ED Notes (Addendum)
Hospitalist at the bedside and made aware of pt's worsening hypotension.

## 2017-02-18 NOTE — Progress Notes (Addendum)
[facility] PROGRESS NOTE  Elizabeth RoupVerna Helmuth  ZOX:[id number] DOB: [birth year] DOA: 02/23/2017 PCP: Sandford Craze'Sullivan, Melissa, NP  Brief Narrative: Elizabeth Keller is a 84 y.o. female with a history of dementia, brain bleed s/p fall in Jan 2018, bedbound since with decubitus ulcers, sarcoidosis, and recurrent UTIs who [facility] with lethargy and hypoxia. She has had a cough, per report, and become more consistently drowsy over the past few days despite undergoing IM ceftriaxone for UTI. She was admitted yesterday for presumed HCAP based on new hypoxia and cough, though 1v CXR showed no focal infiltrates. Cultures obtained, IV steroids, 2L NS, and IV antibiotics given and the patient put in for a bed in the SDU. DNR was confirmed at that time. She has had limited urine output and declining blood pressures, growing less responsive.  Subjective: Elizabeth Keller at bedside this morning reported no changes since admission, pt not meaningfully responsive. Son, Elizabeth Keller at bedside this afternoon with similar report. Have been in consistent discussions with RN with ongoing hypotension w/correct cuff size and position.   Objective: BP (!) 65/36   Pulse 69   Temp (!) 96.6 F (35.9 C) (Rectal) Comment: Warm blankets applied.   Resp 16   Wt 44 kg (97 lb)   SpO2 95%   BMI 16.65 kg/m   Gen: Frail (BMI 16) elderly female laying in bed in unchanged position from ~7:30 this morning.  Pulm: Diminished but clear and nonlabored, 100% on supplemental oxygen this AM CV: Irreg irreg, no murmur, no JVD.  GI: Soft, NT, ND, +BS  Neuro: Lethargic, not rousable. No blink to threat. Pupils sluggishly reactive.  Ext: Cold, dry, no deformities. Right > left arm swelling.   Assessment & Plan: Septic shock: Hypothermic on arrival with ongoing and worsening hypotension and leukocytosis. Her mental status and diminishing urine output indicate hypoperfusion. Presumably urinary source with her history and urinalysis findings. I do not  see evidence of a fulminant pneumonia on CXR, and suspect her respiratory failure is due to sepsis. I've discussed the case with both HCPOA's, her son, Elizabeth Keller, and daughter, Elizabeth Keller. Specifically that her decompensation is concerning for nearing end of life. - Fluid balance precarious w/chronic CHF, elevated BNP and swelling but also ongoing hypotension due to sepsis. Gave 500cc bolus for assessment with no improvement in BP. Will hold IVF's, continue high dose steroids.  - Continue broad abx, urine and wound cultures from previous reviewed, no MDRO's noted.  - I do not believe pressors are in line with the patient/family's goals of care.  Acute hypoxic respiratory failure: Due to sepsis with baseline sarcoidosis.  - Continue supplemental oxygen, breathing treatments, steroids. - Confirmed no ventilatory support, oxygenating well w/O2 alone.  AFib with controlled ventricular response:  - No intervention planned as I believe this is end of life.   To maximize patient and family comfort, I will have the patient admitted to med-surg. I've called palliative care for ongoing discussions with her family, specifically her 2 HCPOA's. I appreciate their assistance. I anticipate inpatient demise vs. transfer to residential hospice if she improves.    Hazeline Junkeryan Reshonda Koerber, MD [facility] Pager [phone] , 1:29 PM

## 2017-02-18 NOTE — Care Management Note (Signed)
Case Management Note  Patient Details  Name: Ileana RoupVerna Brookover MRN: [id number] Date of Birth: February 08, 1924  Subjective/Objective:                  hypoxia  Action/Plan: Date: February 18, 2017 Marcelle SmilingRhonda Davis, BSN, Wilroads GardensRN3, [place]M [phone] Chart and notes review for patient progress and needs. Will follow for case management and discharge needs. Next review date: [id number]  Expected Discharge Date:  (unknown)               Expected Discharge Plan:  Skilled Nursing Facility  In-House Referral:     Discharge planning Services  CM Consult  Post Acute Care Choice:    Choice offered to:     DME Arranged:    DME Agency:     HH Arranged:    HH Agency:     Status of Service:  In process, will continue to follow  If discussed at Long Length of Stay Meetings, dates discussed:    Additional Comments:  Golda AcreDavis, Rhonda Lynn, RN , 2:50 PM

## 2017-02-18 NOTE — Progress Notes (Signed)
Patient was transferred from ED at 1500; unresponsive and disoriented x4; vital signs was taken. Family member at bedside. Will monitor patient as protocol

## 2017-02-18 NOTE — ED Notes (Signed)
Pt unable to provide urine sample at this time. No output in foley catheter. Fluid bolus started. Will obtain specimen once pt provides urine output.

## 2017-02-18 NOTE — ED Notes (Signed)
This RN attempted to [facility] unsuccessfully. Pt family estimates patient weight to be 97lbs. Pharmacy made aware.

## 2017-02-18 NOTE — Progress Notes (Signed)
Nurse tech alerted me of a bp 44/20 manual. I assessed pt and obtained a lower blood pressure and noted agonal respirations. Informed family at the bedside that Ms Elizabeth Keller was passing away taking her last breaths at this time. Emotional support provided as family returned to the bedside.  2nd nurse verified no heartbeat at 2058pm.  [facility] Kirtland BouchardK. Schorr NP notified via Amion.

## 2017-02-18 NOTE — Progress Notes (Signed)
Pharmacy Antibiotic Note  Elizabeth RoupVerna Keller is a 84 y.o. female admitted on 02/28/2017 with cough, suspected pneumonia or UTI, sepsis.  She was reportedly IM Ceftriaxone at her nursing facility prior to admission and she had a chronic foley that was changed on  in the ED.  PMH significant for sarcoidosis, CKD, FTT, dementia, CHF, UTI, chronic pain, and anemia.  Pharmacy has been consulted for Vancomycin and Zosyn dosing.  Today, : Lactic acid increased: 1.7 > 2 SCr increased to 1.29 Temp: hypothermic 96.6 WBC elevated at 16.4  Plan:  Zosyn 3.375g IV Q8H infused over 4hrs.   Vancomycin 1g IV given x1 on 1/9 at ~ 20:00, followed by 500mg  IV q48h.  Measure Vanc trough, peak at steady state.  Goal AUC 400-500  Follow up renal fxn, culture results, and clinical course.  Weight: 97 lb (44 kg)  Rn reports 11/09/2016 weight of 103 lbs.  Estimated weight  97 lbs (44 kg), unable to obtain most recent weight from outside facility.  Temp (24hrs), Avg:97.1 F (36.2 C), Min:96.6 F (35.9 C), Max:97.6 F (36.4 C)  Recent Labs  Lab 14-Sep-2017 1407 14-Sep-2017 1416 14-Sep-2017 1906 14-Sep-2017 2050  0358  0359  WBC 12.9*  --  16.2*  --  16.4*  --   CREATININE 1.04*  --  1.11*  --  1.29*  --   LATICACIDVEN  --  1.06  --  1.7  --  2.0*    CrCl cannot be calculated (Unknown ideal weight.).    Allergies  Allergen Reactions  . Dicyclomine Other (See Comments)    Reaction:  Dizziness   . Gabapentin Other (See Comments)    Reaction:  Dizziness   . Metformin And Related Nausea And Vomiting  . Lyrica [Pregabalin] Other (See Comments)    Reaction:  Unknown  . Topiramate Other (See Comments)    Reaction:  Unknown    Antimicrobials this admission: 1/9 Doxy x1  1/9 Zosyn x1 1/9 Vanc x1  1/10 Zosyn >>  1/10 Vancomycin >>   Dose adjustments this admission:   Microbiology results: 1/9 BCx: sent 1/9 Sputum:  1/10 UCx:   Thank you for allowing pharmacy to be a  part of this patient's care.  Lynann Beaverhristine Jona Zappone PharmD, BCPS Pager [phone]  9:28 AM

## 2017-02-18 NOTE — ED Notes (Signed)
CRITICAL VALUE STICKER  CRITICAL VALUE: Lactic Acid 2.0  RECEIVER (on-site recipient of call): S. Estiven Kohan, RN  DATE & TIME NOTIFIED:  4:38 AM   MESSENGER (representative from lab): Amy  MD NOTIFIED:   TIME OF NOTIFICATION: 4:38 AM  RESPONSE: none at this time

## 2017-02-22 LAB — CULTURE, BLOOD (ROUTINE X 2)
CULTURE: NO GROWTH
Culture: NO GROWTH
SPECIAL REQUESTS: ADEQUATE
SPECIAL REQUESTS: ADEQUATE

## 2017-03-12 NOTE — Death Summary Note (Signed)
DEATH SUMMARY   Patient Details  Name: Elizabeth Keller MRN: [id number] DOB: [birth year]  Admission/Discharge Information   Admit Date:  02/16/2017  Date of Death: Date of Death:   Time of Death: Time of Death: 2058  Length of Stay: 2  Referring Physician: Sandford Craze'Sullivan, Melissa, NP   Reason(s) for Hospitalization  Sepsis  Diagnoses  Preliminary cause of death:  Secondary Diagnoses (including complications and co-morbidities):  Principal Problem:   Acute respiratory failure with hypoxia (HCC) Active Problems:   Chronic kidney disease, stage III (moderate) (HCC)   Failure to thrive in adult   Cognitive impairment   Anemia, chronic disease   Chronic pain syndrome   Chronic CHF (congestive heart failure) (HCC)   UTI (urinary tract infection)   Sarcoidosis   Septic shock [name](HCC)   Brief Hospital Course (including significant findings, care, treatment, and services provided and events leading to death)  Elizabeth Keller is a [age] y.o. female with a history of dementia, brain bleed s/p fall in Jan 2018, bedbound since with decubitus ulcers, sarcoidosis, and recurrent UTIs [facility] with lethargy and hypoxia. She has had a cough, per report, and become more consistently drowsy over the past few days despite undergoing IM ceftriaxone for UTI. She was admitted yesterday for presumed HCAP based on new hypoxia and cough, though 1v CXR showed no focal infiltrates. Cultures obtained, IV steroids, 2L NS, and IV antibiotics given and the patient put in for a bed in the SDU. DNR was confirmed at that time. She has had limited urine output and declining blood pressures, growing less responsive. End of life was suspected, so palliative care consulted and patient admitted to medical floor. She died later in the evening.   Pertinent Labs and Studies  Significant Diagnostic Studies Dg Chest Portable 1 View  Result Date: 02/26/2017 CLINICAL DATA:  Shortness of breath.  History of  sarcoidosis. EXAM: PORTABLE CHEST 1 VIEW COMPARISON:  Chest radiograph and chest CT November 24, 2016 FINDINGS: Upper lobe predominant fibrosis remains with cicatrization in the upper lobes. There is no evident new opacity. Heart is mildly enlarged but stable. There is evidence of pulmonary arterial hypertension, better delineated on recent CT. There is aortic atherosclerosis. No adenopathy is appreciable by radiography. No evident bone lesions. IMPRESSION: Upper lobe fibrosis and cicatrization, likely due to history of sarcoidosis. There is cardiomegaly with pulmonary arterial hypertension. Question a degree of underlying cor pulmonale. There is aortic atherosclerosis. No new parenchymal lung opacity. No adenopathy evident. Pulmonary arterial hypertension could obscure smaller hilar lymph nodes on radiography. Aortic Atherosclerosis (ICD10-I70.0). Electronically Signed   By: Bretta BangWilliam  Woodruff III M.D.   On: 03/08/2017 11:42    Microbiology Recent Results (from the past 240 hour(s))  Blood culture (routine x 2)     Status: None (Preliminary result)   Collection Time: 02/11/2017  2:07 PM  Result Value Ref Range Status   Specimen Description BLOOD RIGHT ANTECUBITAL  Final   Special Requests IN PEDIATRIC BOTTLE Blood Culture adequate volume  Final   Culture   Final    NO GROWTH 2 DAYS Performed at [facility], [address] [place], [place] [address]    Report Status PENDING  Incomplete  Blood culture (routine x 2)     Status: None (Preliminary result)   Collection Time:   7:06 PM  Result Value Ref Range Status   Specimen Description BLOOD LEFT ANTECUBITAL  Final   Special Requests   Final  BOTTLES DRAWN AEROBIC AND ANAEROBIC Blood Culture adequate volume   Culture   Final    NO GROWTH 2 DAYS Performed at [facility], [address], [place], [place] [address]    Report Status PENDING  Incomplete    Lab Basic Metabolic Panel: Recent Labs  Lab 02/21/2017 1407  03/03/2017 1906  0358  NA 143  --  147*  K 4.3  --  5.1  CL 110  --  113*  CO2 28  --  29  GLUCOSE 131*  --  245*  BUN 39*  --  40*  CREATININE 1.04* 1.11* 1.29*  CALCIUM 9.1  --  8.8*   Liver Function Tests: Recent Labs  Lab 02/24/2017 1407  AST 15  ALT 5*  ALKPHOS 70  BILITOT 0.6  PROT 5.9*  ALBUMIN 2.2*   No results for input(s): LIPASE, AMYLASE in the last 168 hours. No results for input(s): AMMONIA in the last 168 hours. CBC: Recent Labs  Lab 02/27/2017 1407 02/24/2017 1906  0358  WBC 12.9* 16.2* 16.4*  NEUTROABS 10.9*  --   --   HGB 7.3* 8.8* 8.8*  HCT 24.3* 29.8* 31.2*  MCV 93.1 95.2 99.4  PLT 421* 410* 402*   Cardiac Enzymes: Recent Labs  Lab 03/02/2017 1906  TROPONINI 0.04*   Sepsis Labs: Recent Labs  Lab 03/08/2017 1407 02/28/2017 1416 02/16/2017 1906 03/02/2017 2050  0358  0359  WBC 12.9*  --  16.2*  --  16.4*  --   LATICACIDVEN  --  1.06  --  1.7  --  2.0*    Procedures/Operations  None   Hazeline Junker, MD 03/02/17, 4:03 PM

## 2017-03-12 NOTE — Progress Notes (Signed)
Clarification:   CHF type is chronic diastolic CHF.   [name], MD 02/11/2017 5:27 PM

## 2017-03-12 DEATH — deceased

## 2018-09-29 IMAGING — CT CT HEAD W/O CM
4 series · 16 of 47 positions shown, 18 images · non-contrast
Comparison: March 14, 2016

CLINICAL DATA: Follow-up intracranial hemorrhage.

EXAM:
CT HEAD WITHOUT CONTRAST
TECHNIQUE: Contiguous axial images were obtained from the base of the skull
through the vertex without intravenous contrast.

[Series 2: head without · axial · non-contrast · 0.41mm/px · z∈[+1306,+1426]mm · 7 of 34 slices shown, 9 images]
[im 5/34  brain]
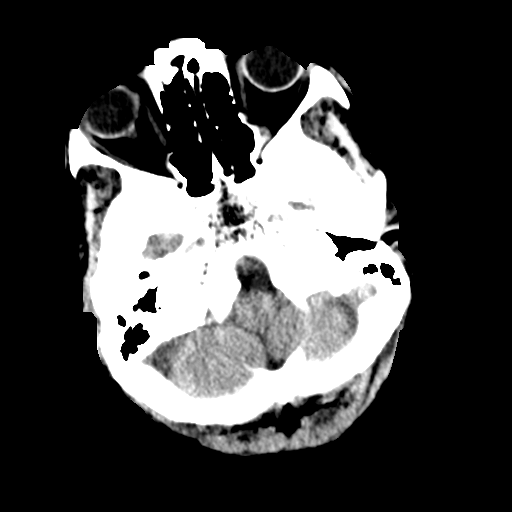
[im 5/34  bone]
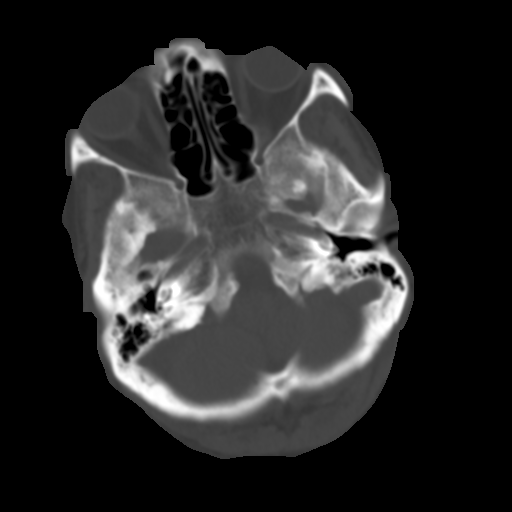
[im 9/34  brain]
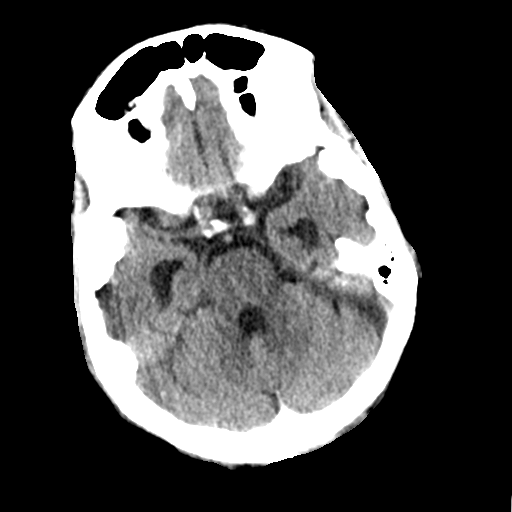
[im 13/34  brain]
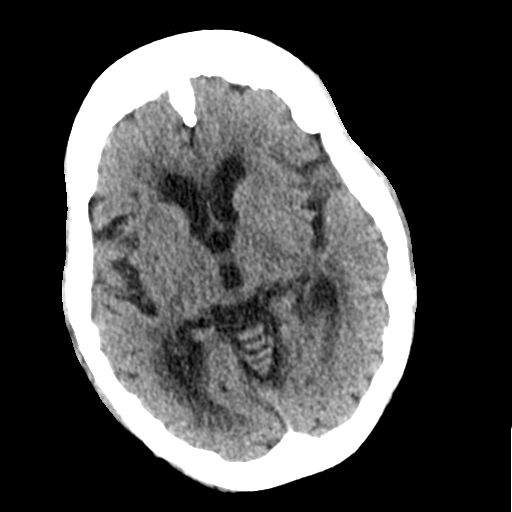
[im 17/34  brain]
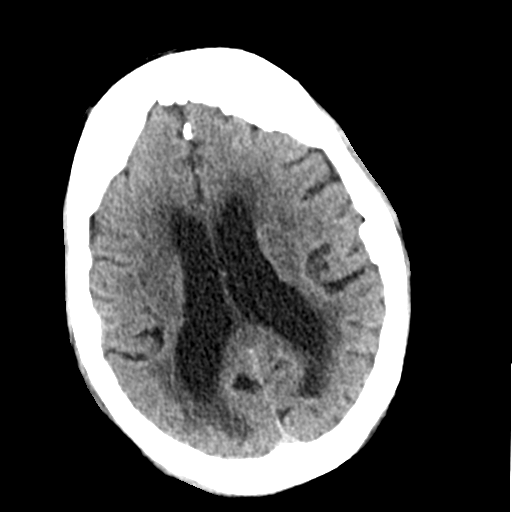
[im 21/34  brain]
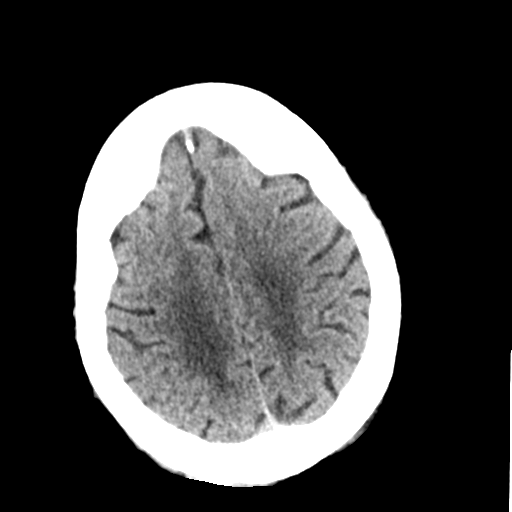
[im 21/34  bone]
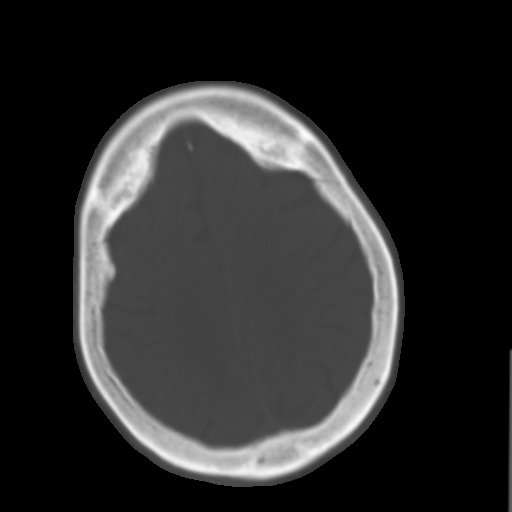
[im 25/34  brain]
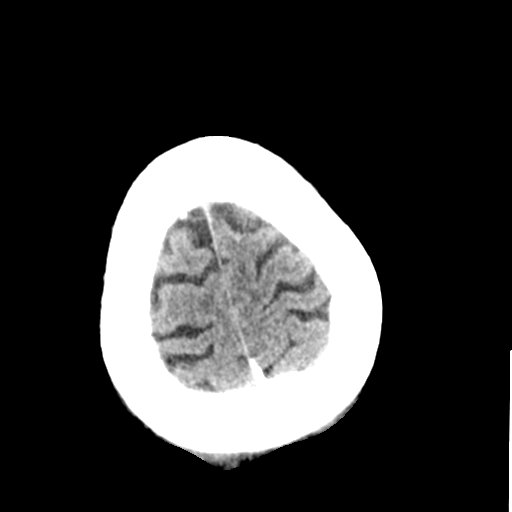
[im 29/34  brain]
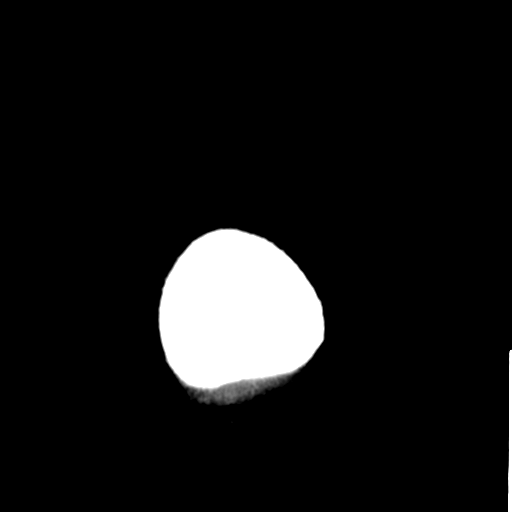

[Series 3: head bone · axial · 0.41mm/px · z∈[+1302,+1334]mm · 3 of 83 slices shown]
[im 9/83  bone]
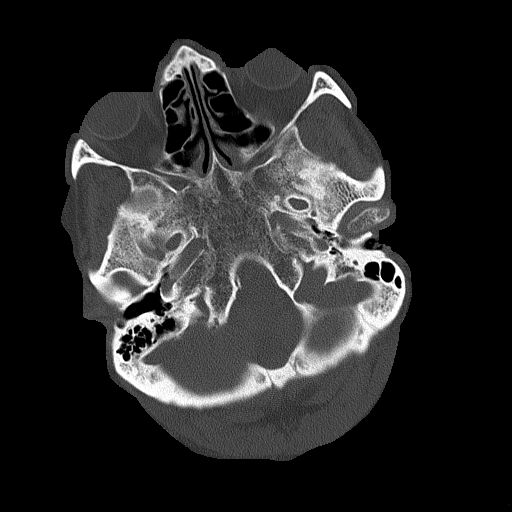
[im 17/83  bone]
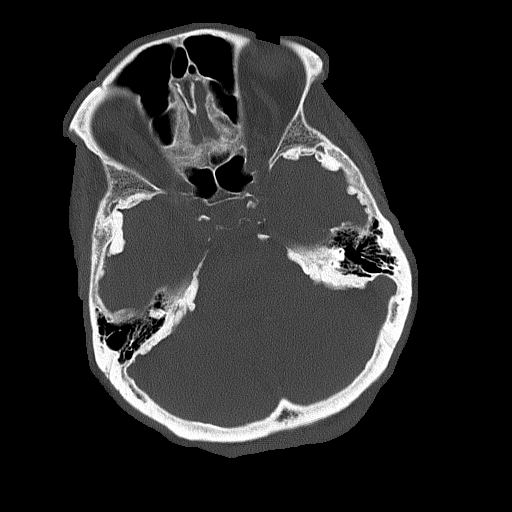
[im 25/83  bone]
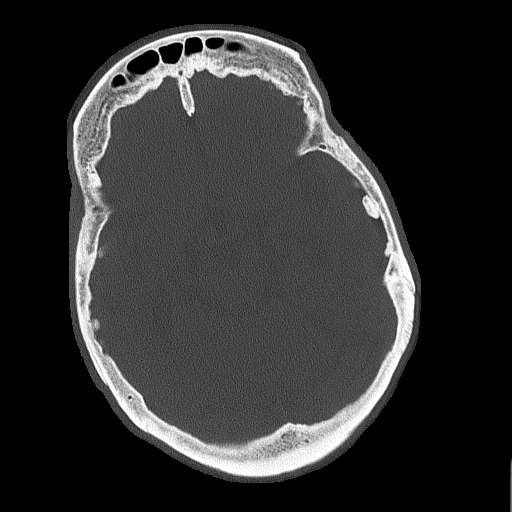

[Series 4: head without cor · coronal · non-contrast · 0.29mm/px · 3 of 67 slices shown]
[im 23/67  brain]
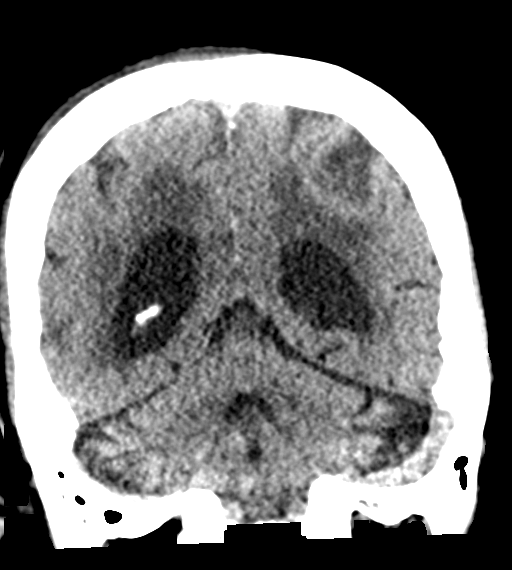
[im 30/67  brain]
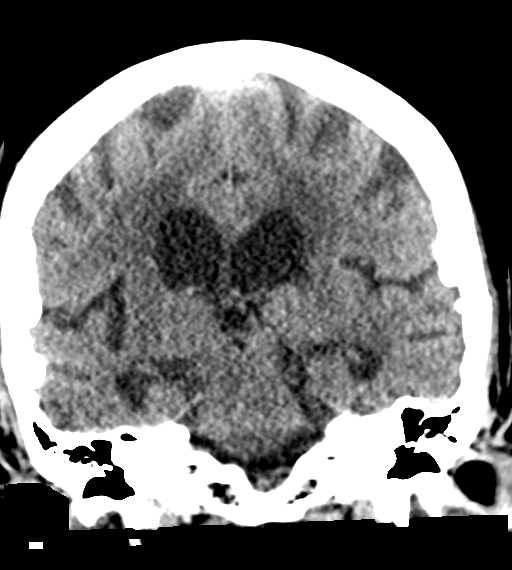
[im 37/67  brain]
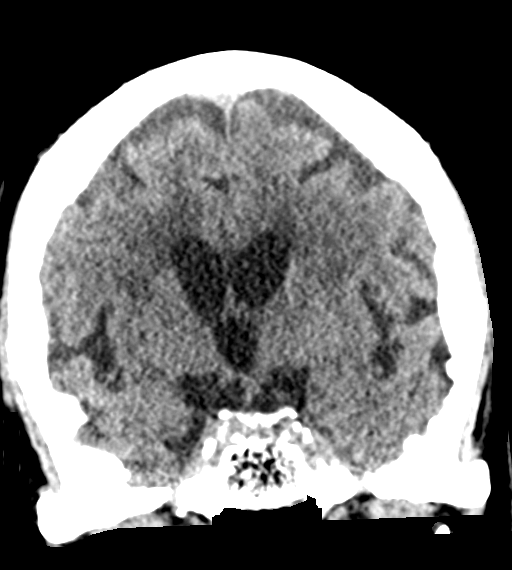

[Series 5: head without sag · sagittal · non-contrast · 0.32mm/px · 3 of 67 slices shown]
[im 23/67  brain]
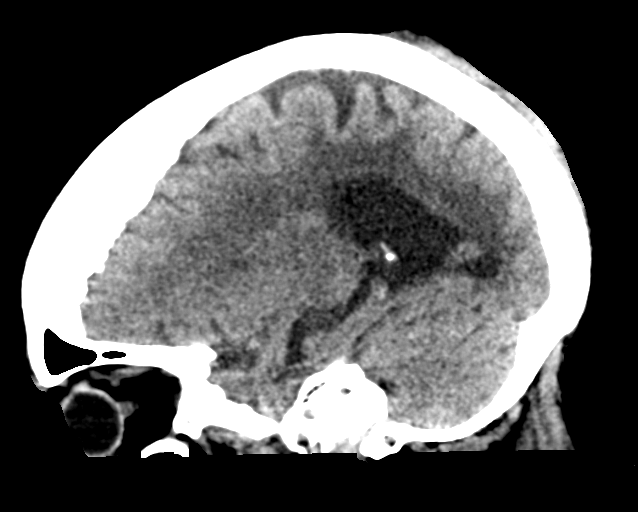
[im 34/67  brain]
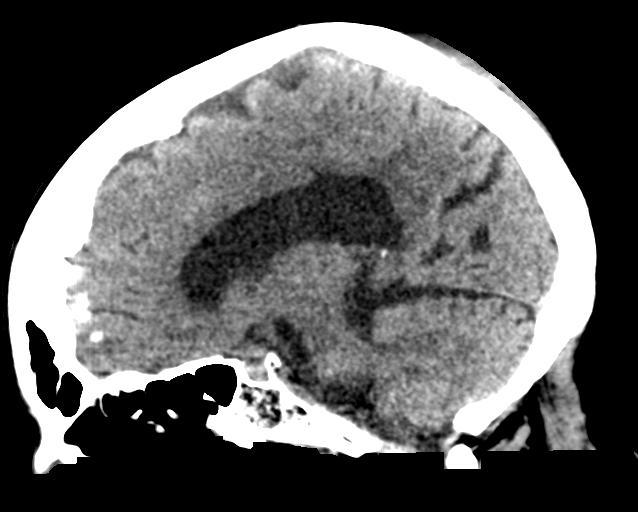
[im 45/67  brain]
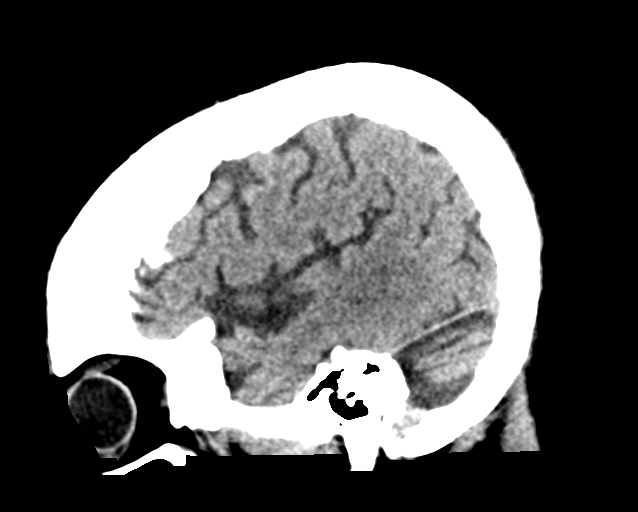

[16 of 47 positions shown; findings below may reference images not displayed]

FINDINGS: Brain: No subdural, epidural, or parenchymal hemorrhage. The acute
blood products in the body of the left lateral ventricle on the
previous study have decreased in quantity with only a tiny amount of
blood remaining at this site. However, the blood in the body of the
left lateral ventricle on the previous study has redistributed into
the posterior horn with an increase in this location. Slight high
attenuation in the left suprasellar cistern may represent a tiny
amount of redistributed blood as well. Overall, the amount of
hemorrhage has not increased. The ventricles are prominent but
stable. The sulci are unchanged. White matter changes are
identified. No acute cortical ischemia identified. The cerebellum
and brainstem are normal. The basal cisterns are widely patent.

Vascular: Calcified atherosclerosis seen in the intracranial carotid
arteries.

Skull: Normal. Negative for fracture or focal lesion.

Sinuses/Orbits: No acute finding.

Other: The posterior right scalp hematoma remains but is smaller in
the interval. Extracranial soft tissues are otherwise stable.
IMPRESSION: 1. The blood seen in the left lateral ventricle previously is stable
in overall quantity. However, there has been some redistribution
from the body of the left lateral ventricle into the posterior horn
and perhaps into the suprasellar cistern. No increased hemorrhage
today.
